# Patient Record
Sex: Male | Born: 1960 | Race: Black or African American | Hispanic: No | Marital: Single | Smoking: Current every day smoker
Health system: Southern US, Community
[De-identification: ages and names within clinical notes are randomized; demographics above are authoritative.]

## PROBLEM LIST (undated history)

## (undated) ENCOUNTER — Encounter

## (undated) ENCOUNTER — Telehealth

## (undated) ENCOUNTER — Ambulatory Visit

## (undated) ENCOUNTER — Ambulatory Visit: Payer: PRIVATE HEALTH INSURANCE | Attending: Family | Primary: Family

## (undated) ENCOUNTER — Ambulatory Visit: Payer: PRIVATE HEALTH INSURANCE

## (undated) ENCOUNTER — Ambulatory Visit
Payer: PRIVATE HEALTH INSURANCE | Attending: Rehabilitative and Restorative Service Providers" | Primary: Rehabilitative and Restorative Service Providers"

## (undated) ENCOUNTER — Encounter
Payer: PRIVATE HEALTH INSURANCE | Attending: Rehabilitative and Restorative Service Providers" | Primary: Rehabilitative and Restorative Service Providers"

## (undated) ENCOUNTER — Ambulatory Visit
Payer: PRIVATE HEALTH INSURANCE | Attending: Pharmacist Clinician (PhC)/ Clinical Pharmacy Specialist | Primary: Pharmacist Clinician (PhC)/ Clinical Pharmacy Specialist

## (undated) ENCOUNTER — Telehealth: Attending: Family | Primary: Family

## (undated) ENCOUNTER — Telehealth: Attending: Children | Primary: Children

## (undated) ENCOUNTER — Encounter
Attending: Pharmacist Clinician (PhC)/ Clinical Pharmacy Specialist | Primary: Pharmacist Clinician (PhC)/ Clinical Pharmacy Specialist

## (undated) ENCOUNTER — Non-Acute Institutional Stay
Payer: PRIVATE HEALTH INSURANCE | Attending: Rehabilitative and Restorative Service Providers" | Primary: Rehabilitative and Restorative Service Providers"

## (undated) ENCOUNTER — Encounter: Attending: Family | Primary: Family

## (undated) ENCOUNTER — Telehealth
Attending: Pharmacist Clinician (PhC)/ Clinical Pharmacy Specialist | Primary: Pharmacist Clinician (PhC)/ Clinical Pharmacy Specialist

## (undated) ENCOUNTER — Encounter: Attending: Diagnostic Radiology | Primary: Diagnostic Radiology

## (undated) DIAGNOSIS — M1990 Unspecified osteoarthritis, unspecified site: Secondary | ICD-10-CM

## (undated) DIAGNOSIS — K922 Gastrointestinal hemorrhage, unspecified: Secondary | ICD-10-CM

## (undated) DIAGNOSIS — F329 Major depressive disorder, single episode, unspecified: Secondary | ICD-10-CM

## (undated) DIAGNOSIS — F32A Depression, unspecified: Secondary | ICD-10-CM

## (undated) DIAGNOSIS — I10 Essential (primary) hypertension: Secondary | ICD-10-CM

## (undated) DIAGNOSIS — C259 Malignant neoplasm of pancreas, unspecified: Secondary | ICD-10-CM

## (undated) DIAGNOSIS — G4730 Sleep apnea, unspecified: Secondary | ICD-10-CM

## (undated) HISTORY — PX: COLONOSCOPY: SHX174

---

## 2000-07-04 ENCOUNTER — Emergency Department (HOSPITAL_COMMUNITY): Admission: EM | Admit: 2000-07-04 | Discharge: 2000-07-04 | Payer: Self-pay | Admitting: Emergency Medicine

## 2003-08-09 ENCOUNTER — Other Ambulatory Visit: Payer: Self-pay

## 2006-07-11 ENCOUNTER — Inpatient Hospital Stay: Payer: Self-pay | Admitting: Unknown Physician Specialty

## 2012-02-23 ENCOUNTER — Inpatient Hospital Stay: Payer: Self-pay | Admitting: Internal Medicine

## 2012-02-23 LAB — CBC WITH DIFFERENTIAL/PLATELET
Basophil %: 0.3 %
Eosinophil %: 0 %
HCT: 38 % — ABNORMAL LOW (ref 40.0–52.0)
HGB: 12.2 g/dL — ABNORMAL LOW (ref 13.0–18.0)
Lymphocyte #: 0.7 10*3/uL — ABNORMAL LOW (ref 1.0–3.6)
MCV: 82 fL (ref 80–100)
Monocyte %: 10.9 %
Neutrophil #: 6.7 10*3/uL — ABNORMAL HIGH (ref 1.4–6.5)
Neutrophil %: 80 %
RBC: 4.66 10*6/uL (ref 4.40–5.90)
WBC: 8.4 10*3/uL (ref 3.8–10.6)

## 2012-02-23 LAB — URINALYSIS, COMPLETE
Glucose,UR: 150 mg/dL (ref 0–75)
Nitrite: NEGATIVE
Ph: 5 (ref 4.5–8.0)
Specific Gravity: 1.041 (ref 1.003–1.030)
WBC UR: 2 /HPF (ref 0–5)

## 2012-02-23 LAB — COMPREHENSIVE METABOLIC PANEL
Albumin: 4.1 g/dL (ref 3.4–5.0)
Alkaline Phosphatase: 81 U/L (ref 50–136)
Anion Gap: 11 (ref 7–16)
BUN: 6 mg/dL — ABNORMAL LOW (ref 7–18)
Bilirubin,Total: 2.1 mg/dL — ABNORMAL HIGH (ref 0.2–1.0)
Chloride: 97 mmol/L — ABNORMAL LOW (ref 98–107)
Creatinine: 1.15 mg/dL (ref 0.60–1.30)
Glucose: 200 mg/dL — ABNORMAL HIGH (ref 65–99)
Potassium: 3.4 mmol/L — ABNORMAL LOW (ref 3.5–5.1)
SGOT(AST): 106 U/L — ABNORMAL HIGH (ref 15–37)
SGPT (ALT): 76 U/L (ref 12–78)
Total Protein: 8.2 g/dL (ref 6.4–8.2)

## 2012-02-23 LAB — TROPONIN I: Troponin-I: 0.05 ng/mL

## 2012-02-23 LAB — LIPASE, BLOOD: Lipase: >3000 U/L (ref 73–393)

## 2012-02-24 LAB — COMPREHENSIVE METABOLIC PANEL
Albumin: 3.4 g/dL (ref 3.4–5.0)
Anion Gap: 10 (ref 7–16)
BUN: 6 mg/dL — ABNORMAL LOW (ref 7–18)
Bilirubin,Total: 2.3 mg/dL — ABNORMAL HIGH (ref 0.2–1.0)
Creatinine: 1.2 mg/dL (ref 0.60–1.30)
Glucose: 123 mg/dL — ABNORMAL HIGH (ref 65–99)
Osmolality: 267 (ref 275–301)
Potassium: 3.4 mmol/L — ABNORMAL LOW (ref 3.5–5.1)
SGOT(AST): 66 U/L — ABNORMAL HIGH (ref 15–37)
SGPT (ALT): 59 U/L (ref 12–78)
Sodium: 134 mmol/L — ABNORMAL LOW (ref 136–145)
Total Protein: 7.3 g/dL (ref 6.4–8.2)

## 2012-02-24 LAB — LIPID PANEL
Cholesterol: 156 mg/dL (ref 0–200)
Ldl Cholesterol, Calc: 50 mg/dL (ref 0–100)
Triglycerides: 81 mg/dL (ref 0–200)

## 2012-02-24 LAB — HEMOGLOBIN A1C: Hemoglobin A1C: 6.4 % — ABNORMAL HIGH (ref 4.2–6.3)

## 2012-02-24 LAB — LIPASE, BLOOD: Lipase: 2367 U/L — ABNORMAL HIGH (ref 73–393)

## 2012-02-27 LAB — LIPID PANEL
Cholesterol: 138 mg/dL (ref 0–200)
Ldl Cholesterol, Calc: 50 mg/dL (ref 0–100)
Triglycerides: 83 mg/dL (ref 0–200)
VLDL Cholesterol, Calc: 17 mg/dL (ref 5–40)

## 2012-02-27 LAB — HEPATIC FUNCTION PANEL A (ARMC)
Alkaline Phosphatase: 57 U/L (ref 50–136)
Bilirubin, Direct: 0.2 mg/dL (ref 0.00–0.20)
Bilirubin,Total: 0.5 mg/dL (ref 0.2–1.0)
SGOT(AST): 61 U/L — ABNORMAL HIGH (ref 15–37)

## 2012-02-27 LAB — CBC WITH DIFFERENTIAL/PLATELET
Basophil #: 0 10*3/uL (ref 0.0–0.1)
Eosinophil #: 0.1 10*3/uL (ref 0.0–0.7)
HCT: 33.4 % — ABNORMAL LOW (ref 40.0–52.0)
Lymphocyte #: 1.4 10*3/uL (ref 1.0–3.6)
Lymphocyte %: 20.6 %
MCHC: 32.3 g/dL (ref 32.0–36.0)
Neutrophil #: 3.9 10*3/uL (ref 1.4–6.5)
Neutrophil %: 55.2 %
Platelet: 221 10*3/uL (ref 150–440)
RDW: 14.2 % (ref 11.5–14.5)

## 2012-02-27 LAB — BASIC METABOLIC PANEL
Anion Gap: 9 (ref 7–16)
Calcium, Total: 8.6 mg/dL (ref 8.5–10.1)
Chloride: 100 mmol/L (ref 98–107)
Co2: 29 mmol/L (ref 21–32)
Creatinine: 0.83 mg/dL (ref 0.60–1.30)
EGFR (African American): >60
Osmolality: 273 (ref 275–301)
Sodium: 138 mmol/L (ref 136–145)

## 2012-02-27 LAB — LIPASE, BLOOD: Lipase: 1449 U/L — ABNORMAL HIGH (ref 73–393)

## 2012-02-28 LAB — LIPASE, BLOOD: Lipase: 1224 U/L — ABNORMAL HIGH (ref 73–393)

## 2012-02-29 LAB — HEPATIC FUNCTION PANEL A (ARMC)
Albumin: 3 g/dL — ABNORMAL LOW (ref 3.4–5.0)
Bilirubin, Direct: 0.2 mg/dL (ref 0.00–0.20)
Bilirubin,Total: 0.6 mg/dL (ref 0.2–1.0)
SGPT (ALT): 56 U/L (ref 12–78)
Total Protein: 7.3 g/dL (ref 6.4–8.2)

## 2012-02-29 LAB — BASIC METABOLIC PANEL
Chloride: 96 mmol/L — ABNORMAL LOW (ref 98–107)
Co2: 29 mmol/L (ref 21–32)
Creatinine: 1.01 mg/dL (ref 0.60–1.30)
Potassium: 3 mmol/L — ABNORMAL LOW (ref 3.5–5.1)
Sodium: 136 mmol/L (ref 136–145)

## 2012-03-01 LAB — COMPREHENSIVE METABOLIC PANEL
Alkaline Phosphatase: 49 U/L — ABNORMAL LOW (ref 50–136)
Calcium, Total: 8.6 mg/dL (ref 8.5–10.1)
Chloride: 104 mmol/L (ref 98–107)
Co2: 28 mmol/L (ref 21–32)
EGFR (African American): >60
EGFR (Non-African Amer.): >60
Glucose: 111 mg/dL — ABNORMAL HIGH (ref 65–99)
SGOT(AST): 57 U/L — ABNORMAL HIGH (ref 15–37)
SGPT (ALT): 54 U/L (ref 12–78)
Sodium: 139 mmol/L (ref 136–145)

## 2012-03-01 LAB — LIPASE, BLOOD: Lipase: 1001 U/L — ABNORMAL HIGH (ref 73–393)

## 2012-09-29 ENCOUNTER — Ambulatory Visit: Payer: Self-pay | Admitting: Gastroenterology

## 2012-09-29 LAB — PROTIME-INR
INR: 1.1
Prothrombin Time: 13.9 secs (ref 11.5–14.7)

## 2013-04-26 ENCOUNTER — Ambulatory Visit: Payer: Self-pay | Admitting: Family Medicine

## 2013-04-28 ENCOUNTER — Ambulatory Visit: Payer: Self-pay | Admitting: Family Medicine

## 2013-08-02 ENCOUNTER — Inpatient Hospital Stay: Payer: Self-pay | Admitting: Family Medicine

## 2013-08-02 LAB — COMPREHENSIVE METABOLIC PANEL
ALBUMIN: 4.2 g/dL (ref 3.4–5.0)
ANION GAP: 5 — AB (ref 7–16)
Alkaline Phosphatase: 62 U/L
BILIRUBIN TOTAL: 1 mg/dL (ref 0.2–1.0)
BUN: 11 mg/dL (ref 7–18)
CALCIUM: 9.4 mg/dL (ref 8.5–10.1)
Chloride: 102 mmol/L (ref 98–107)
Co2: 28 mmol/L (ref 21–32)
Creatinine: 1.38 mg/dL — ABNORMAL HIGH (ref 0.60–1.30)
EGFR (African American): >60
EGFR (Non-African Amer.): 58 — ABNORMAL LOW
Glucose: 115 mg/dL — ABNORMAL HIGH (ref 65–99)
OSMOLALITY: 270 (ref 275–301)
POTASSIUM: 4.1 mmol/L (ref 3.5–5.1)
SGOT(AST): 54 U/L — ABNORMAL HIGH (ref 15–37)
SGPT (ALT): 58 U/L (ref 12–78)
Sodium: 135 mmol/L — ABNORMAL LOW (ref 136–145)
Total Protein: 8.8 g/dL — ABNORMAL HIGH (ref 6.4–8.2)

## 2013-08-02 LAB — CBC
HCT: 40.2 % (ref 40.0–52.0)
HGB: 12.6 g/dL — ABNORMAL LOW (ref 13.0–18.0)
MCH: 26 pg (ref 26.0–34.0)
MCHC: 31.3 g/dL — ABNORMAL LOW (ref 32.0–36.0)
MCV: 83 fL (ref 80–100)
Platelet: 234 10*3/uL (ref 150–440)
RBC: 4.84 10*6/uL (ref 4.40–5.90)
RDW: 14.8 % — AB (ref 11.5–14.5)
WBC: 4.7 10*3/uL (ref 3.8–10.6)

## 2013-08-02 LAB — LIPASE, BLOOD: LIPASE: 267 U/L (ref 73–393)

## 2013-08-02 LAB — HEMOGLOBIN: HGB: 12.8 g/dL — ABNORMAL LOW (ref 13.0–18.0)

## 2013-08-02 LAB — CLOSTRIDIUM DIFFICILE(ARMC)

## 2013-08-03 LAB — HEMOGLOBIN
HGB: 10.9 g/dL — ABNORMAL LOW (ref 13.0–18.0)
HGB: 10.8 g/dL — AB (ref 13.0–18.0)

## 2013-08-04 LAB — HEMOGLOBIN: HGB: 9.5 g/dL — AB (ref 13.0–18.0)

## 2013-08-04 LAB — BASIC METABOLIC PANEL
Anion Gap: 4 — ABNORMAL LOW (ref 7–16)
BUN: 5 mg/dL — AB (ref 7–18)
CREATININE: 1.32 mg/dL — AB (ref 0.60–1.30)
Calcium, Total: 8.4 mg/dL — ABNORMAL LOW (ref 8.5–10.1)
Chloride: 106 mmol/L (ref 98–107)
Co2: 28 mmol/L (ref 21–32)
EGFR (African American): >60
EGFR (Non-African Amer.): >60
Glucose: 116 mg/dL — ABNORMAL HIGH (ref 65–99)
OSMOLALITY: 274 (ref 275–301)
Potassium: 3.9 mmol/L (ref 3.5–5.1)
Sodium: 138 mmol/L (ref 136–145)

## 2013-08-04 LAB — DRUG SCREEN, URINE
AMPHETAMINES, UR SCREEN: NEGATIVE (ref ?–1000)
BENZODIAZEPINE, UR SCRN: NEGATIVE (ref ?–200)
Barbiturates, Ur Screen: NEGATIVE (ref ?–200)
CANNABINOID 50 NG, UR ~~LOC~~: POSITIVE (ref ?–50)
Cocaine Metabolite,Ur ~~LOC~~: NEGATIVE (ref ?–300)
MDMA (Ecstasy)Ur Screen: NEGATIVE (ref ?–500)
Methadone, Ur Screen: NEGATIVE (ref ?–300)
Opiate, Ur Screen: NEGATIVE (ref ?–300)
Phencyclidine (PCP) Ur S: NEGATIVE (ref ?–25)
Tricyclic, Ur Screen: NEGATIVE (ref ?–1000)

## 2013-08-04 LAB — STOOL CULTURE

## 2013-08-05 LAB — CBC WITH DIFFERENTIAL/PLATELET
Basophil #: 0 10*3/uL (ref 0.0–0.1)
Basophil %: 0.6 %
Eosinophil #: 0.1 10*3/uL (ref 0.0–0.7)
Eosinophil %: 1.9 %
HCT: 29.1 % — AB (ref 40.0–52.0)
HGB: 9.2 g/dL — ABNORMAL LOW (ref 13.0–18.0)
LYMPHS ABS: 1.6 10*3/uL (ref 1.0–3.6)
LYMPHS PCT: 37.1 %
MCH: 26.3 pg (ref 26.0–34.0)
MCHC: 31.5 g/dL — ABNORMAL LOW (ref 32.0–36.0)
MCV: 84 fL (ref 80–100)
MONO ABS: 0.8 x10 3/mm (ref 0.2–1.0)
Monocyte %: 18.2 %
NEUTROS ABS: 1.8 10*3/uL (ref 1.4–6.5)
NEUTROS PCT: 42.2 %
Platelet: 189 10*3/uL (ref 150–440)
RBC: 3.48 10*6/uL — ABNORMAL LOW (ref 4.40–5.90)
RDW: 14.6 % — AB (ref 11.5–14.5)
WBC: 4.3 10*3/uL (ref 3.8–10.6)

## 2013-09-08 ENCOUNTER — Ambulatory Visit: Payer: Self-pay | Admitting: Internal Medicine

## 2013-09-08 LAB — FERRITIN: Ferritin (ARMC): 217 ng/mL (ref 8–388)

## 2013-09-08 LAB — CBC CANCER CENTER
BASOS ABS: 1 %
Bands: 1 %
Eosinophil: 4 %
HCT: 36.3 % — ABNORMAL LOW (ref 40.0–52.0)
HGB: 11.4 g/dL — AB (ref 13.0–18.0)
Lymphocytes: 44 %
MCH: 26.1 pg (ref 26.0–34.0)
MCHC: 31.4 g/dL — ABNORMAL LOW (ref 32.0–36.0)
MCV: 83 fL (ref 80–100)
Monocytes: 14 %
Platelet: 315 x10 3/mm (ref 150–440)
RBC: 4.36 10*6/uL — ABNORMAL LOW (ref 4.40–5.90)
RDW: 15.6 % — AB (ref 11.5–14.5)
Segmented Neutrophils: 36 %
WBC: 4.9 x10 3/mm (ref 3.8–10.6)

## 2013-09-08 LAB — IRON AND TIBC
IRON BIND. CAP.(TOTAL): 277 ug/dL (ref 250–450)
Iron Saturation: 39 %
Iron: 108 ug/dL (ref 65–175)
Unbound Iron-Bind.Cap.: 169 ug/dL

## 2013-09-08 LAB — FOLATE: Folic Acid: 10.8 ng/mL (ref 3.1–100.0)

## 2013-09-08 LAB — LACTATE DEHYDROGENASE: LDH: 148 U/L (ref 85–241)

## 2013-10-04 ENCOUNTER — Ambulatory Visit: Payer: Self-pay | Admitting: Internal Medicine

## 2014-01-02 IMAGING — CT CT ABD-PELV W/ CM
1 of 2 series · 14 of 32 positions shown, 18 images · non-contrast
Comparison: none

REASON FOR EXAM: (1) lower abd pain, elevated lipase; (2) lower abd pain,
elevated lipase
COMMENTS:

[Series 2: 3mm soft tissue · axial · 0.68mm/px · z∈[-543,-141]mm · 14 of 148 slices shown, 18 images]
[im 7/148  soft-tissue]
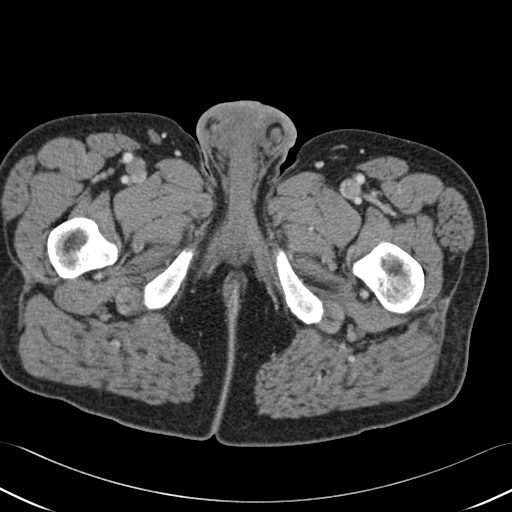
[im 7/148  bone]
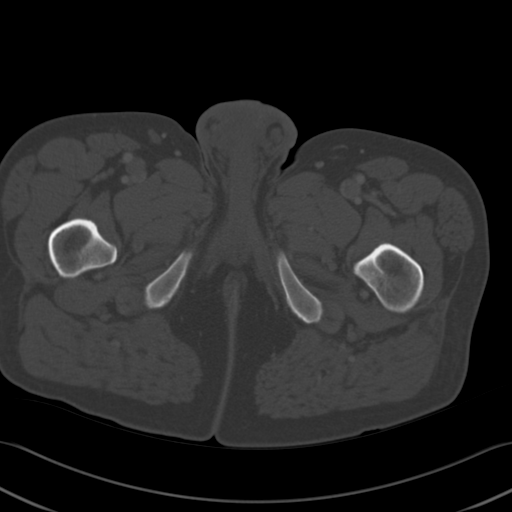
[im 19/148  soft-tissue]
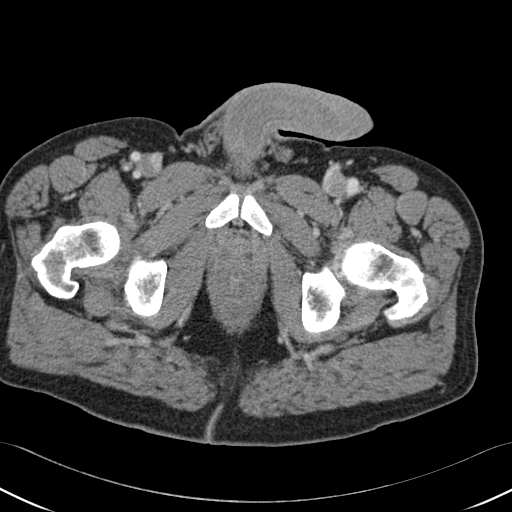
[im 31/148  soft-tissue]
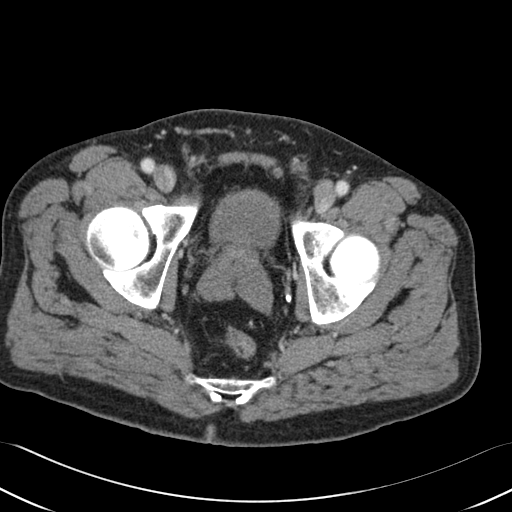
[im 43/148  soft-tissue]
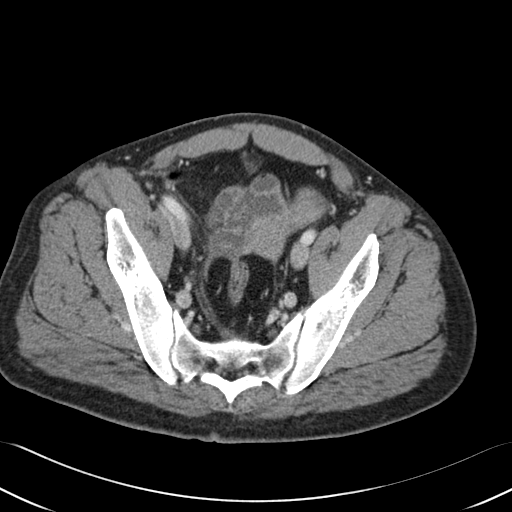
[im 56/148  soft-tissue]
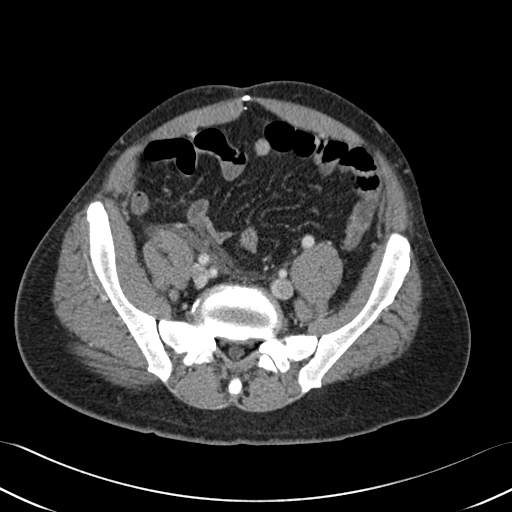
[im 68/148  soft-tissue]
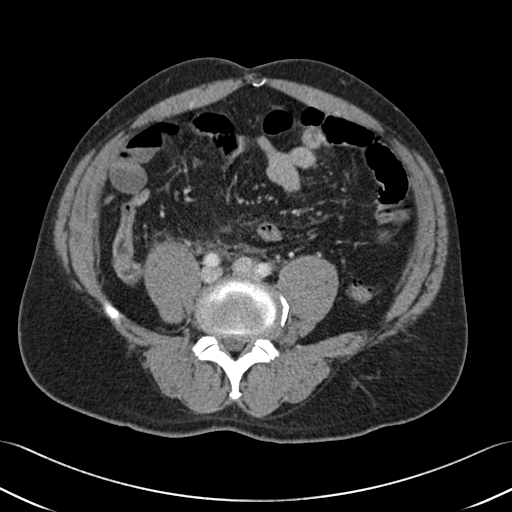
[im 80/148  soft-tissue]
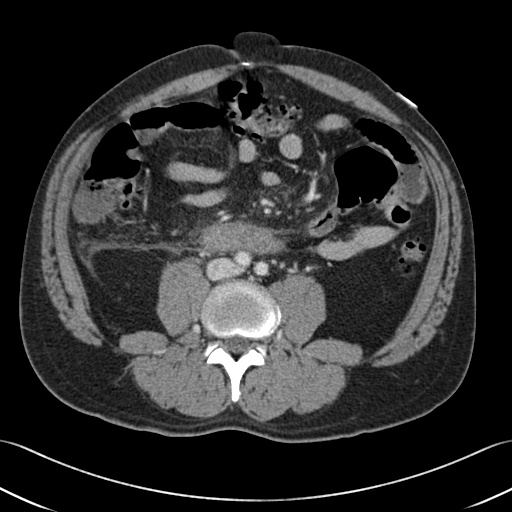
[im 92/148  soft-tissue]
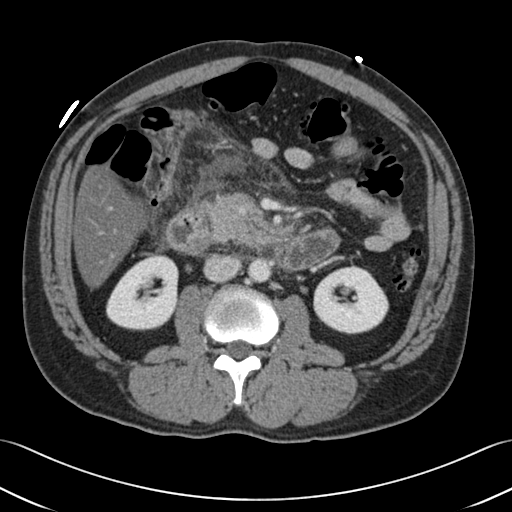
[im 105/148  soft-tissue]
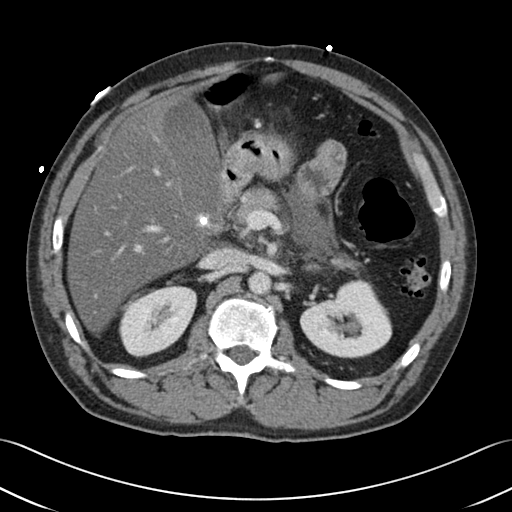
[im 105/148  bone]
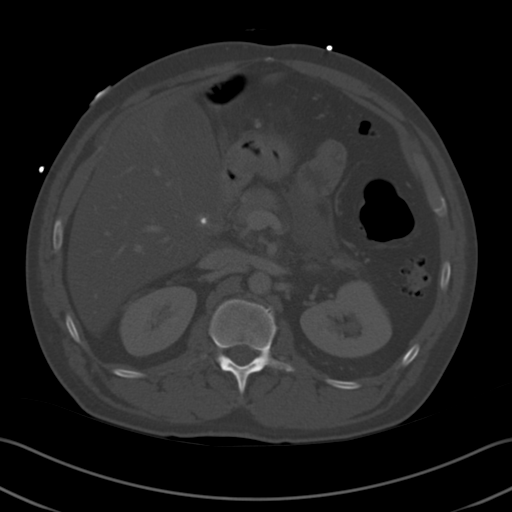
[im 117/148  soft-tissue]
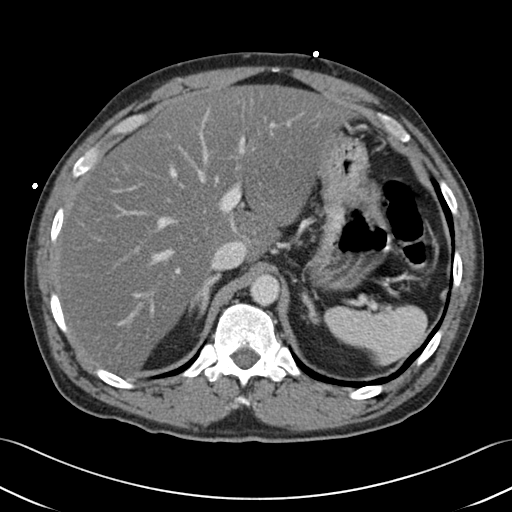
[im 123/148  lung]
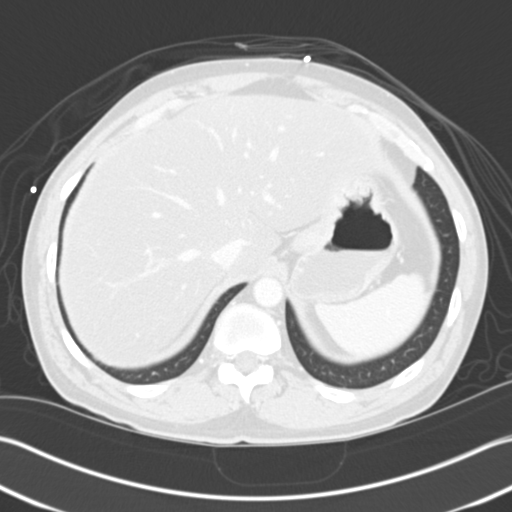
[im 129/148  soft-tissue]
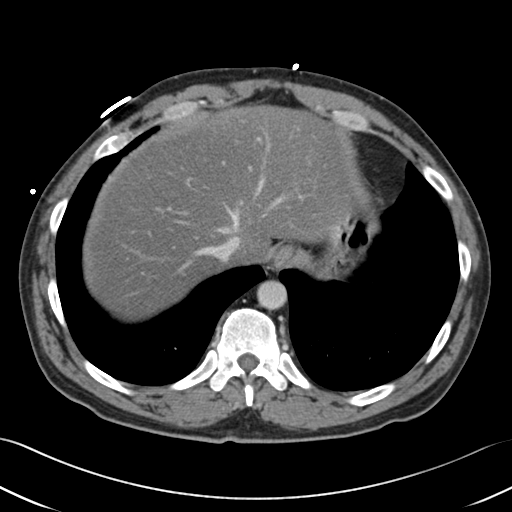
[im 129/148  lung]
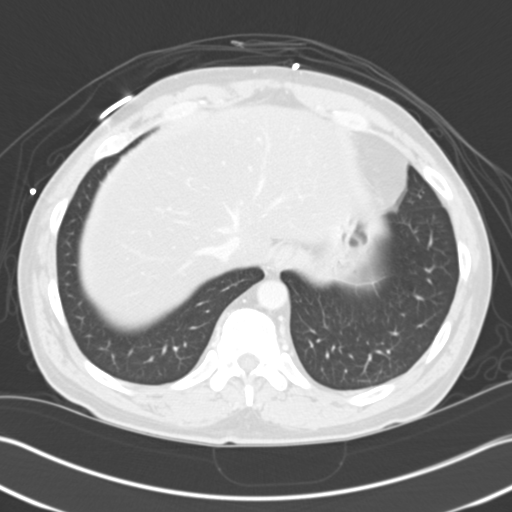
[im 135/148  lung]
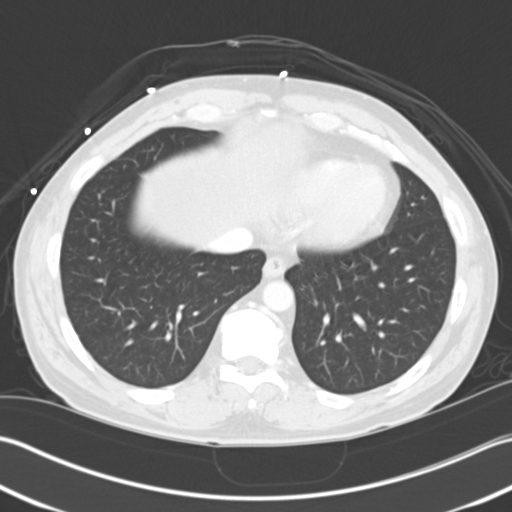
[im 141/148  soft-tissue]
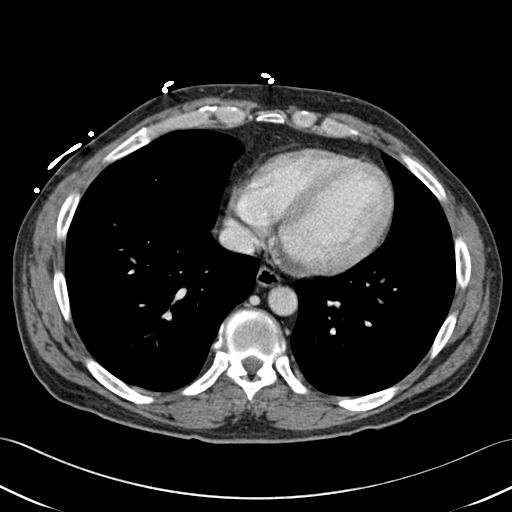
[im 141/148  lung]
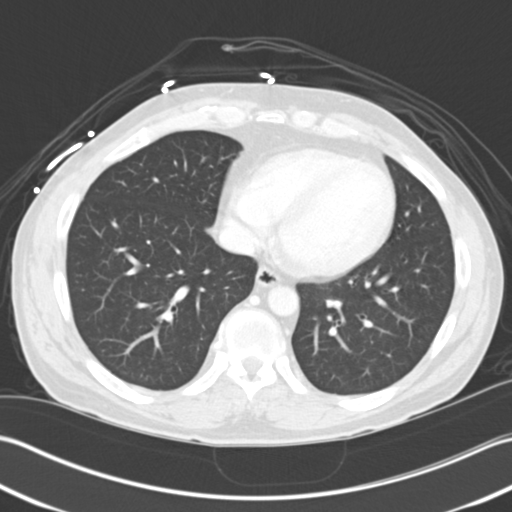

[14 of 32 positions shown; findings below may reference images not displayed]

PROCEDURE:     CT  - CT ABDOMEN / PELVIS  W  - February 23, 2012  [DATE]

RESULT:      Axial CT scanning was performed through the abdomen and pelvis
with reconstructions at 3 mm intervals and slice thicknesses. The patient
received 100 cc of [id number]. The patient did not receive contrast
material. Review of multiplanar reconstructed images was performed
separately on the VIA monitor.

The gallbladder is distended and contains stones near its neck. No definite
gallbladder wall thickening is demonstrated. There are inflammatory changes
in the surrounding fat and these inflammatory changes extend inferiorly and
are associated with the inflamed appearing pancreatic head. There is also
fluid adjacent to the pancreatic body. The stomach and duodenum do not
appear obstructed.  The liver exhibits decreased density diffusely
consistent with fatty infiltration.

The spleen is not enlarged. There are no adrenal masses. The kidneys enhance
well. The caliber of the abdominal aorta is normal. The unopacified loops of
small and large bowel exhibit no evidence of obstruction. There may be
inflammatory changes arising from or secondarily involving portions of the
ascending colon seen on images 53 to 63. There are scattered diverticula
noted throughout the colon.

Within the pelvis the urinary bladder is partially distended. The enlarged
prostate gland produces a moderate impression upon the urinary bladder base.
There is a small right inguinal hernia containing only fat. There is a small
amount of free fluid in the pelvis. The rectosigmoid colon is nondistended.

The lung bases are clear. The lumbar vertebral bodies are preserved in
height.
IMPRESSION: 1. There are inflammatory changes in the upper and mid abdomen likely
secondary to acute pancreatitis. There is fluid adjacent to the pancreatic
body. There are inflammatory changes in the peripancreatic fat. The
gallbladder is mildly distended and contains multiple calcified stones near
the gallbladder neck.
2. There are inflammatory changes associated with the distal aspect of the
ascending colon. There are scattered diverticula here. While focal
diverticulitis could be present here, the degree of adjacent inflammation
extending to the peripancreatic region makes this less likely.
3. There are fatty infiltrative changes of the liver diffusely.
4. There is no evidence of acute urinary tract abnormality nor acute
abnormality of the bowel other than that demonstrated in the ascending
colonic region.

[REDACTED]

Addendum: In discussion of the case with Dr. Natacha it was noted that the
appendix was not mentioned in my original dictation. A normal calibered
gas-filled appendix is demonstrated on images 67 through 722. There are
inflammatory changes extending inferiorly into the right paracolic gutter
likely related to the known pancreatitis.

This addendum was dictated at [DATE] on February 23, 2012.

## 2014-01-02 IMAGING — CR DG ABDOMEN 3V
1 series · 4 of 4 positions shown · non-contrast
Comparison: none

REASON FOR EXAM: abd pain
COMMENTS:

PROCEDURE:     DXR - DXR ABDOMEN 3-WAY (INCL PA CXR)  - February 23, 2012  [DATE]
RESULT:

[Series 1: w chest pa · 0.14mm/px · 4 of 4 slices shown]
[im 1/4]
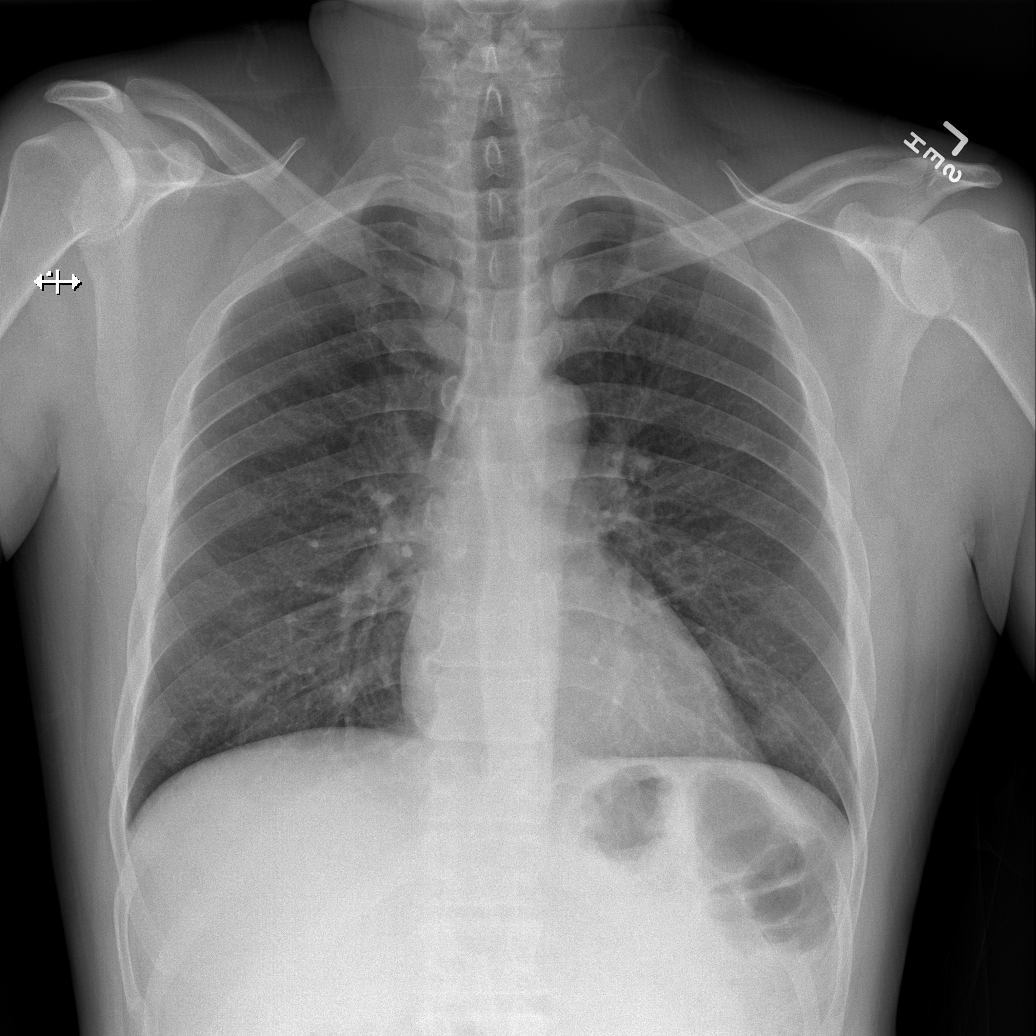
[im 2/4]
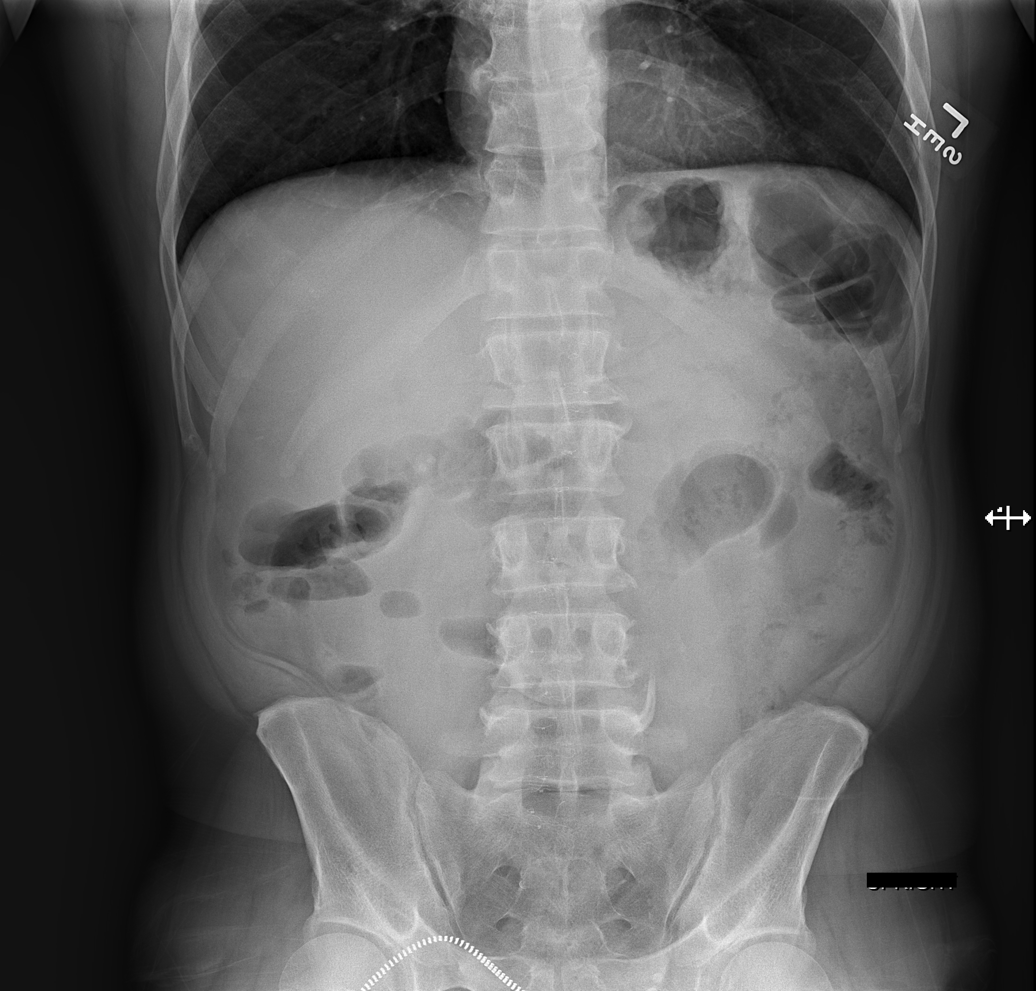
[im 3/4]
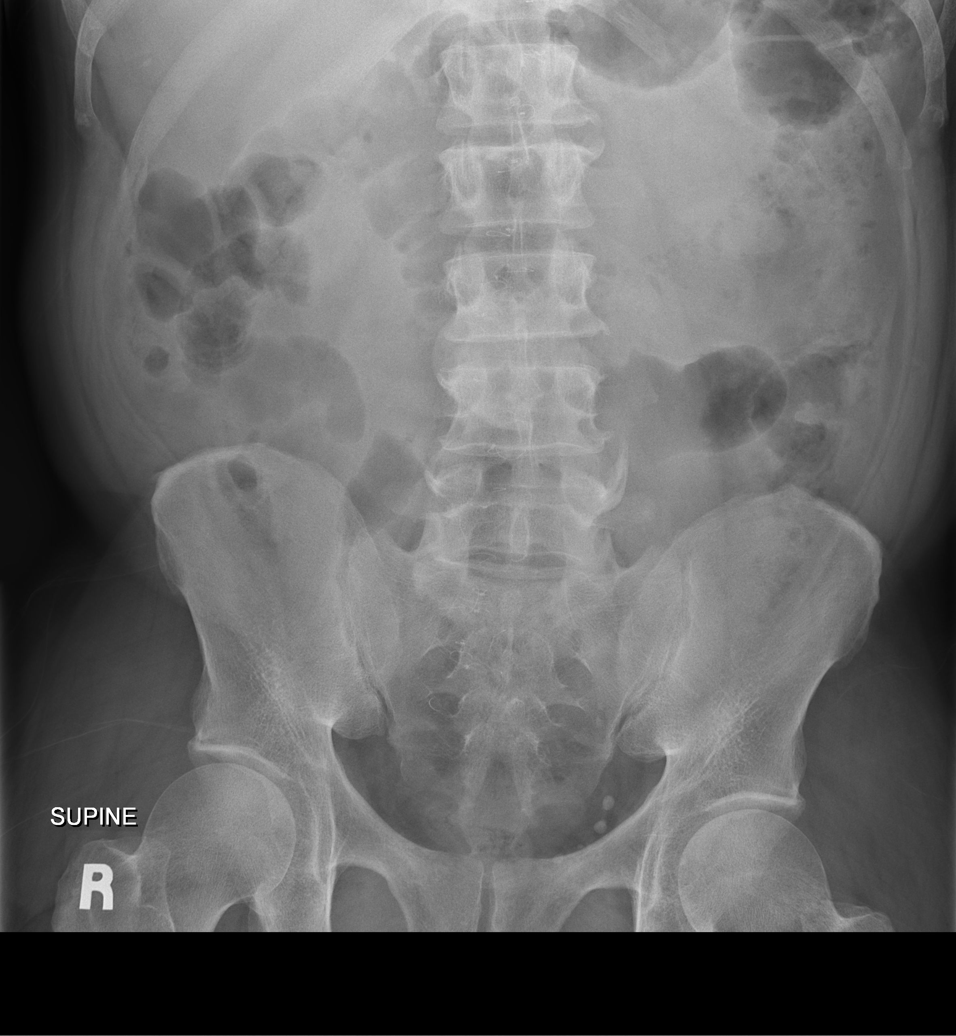
[im 4/4]
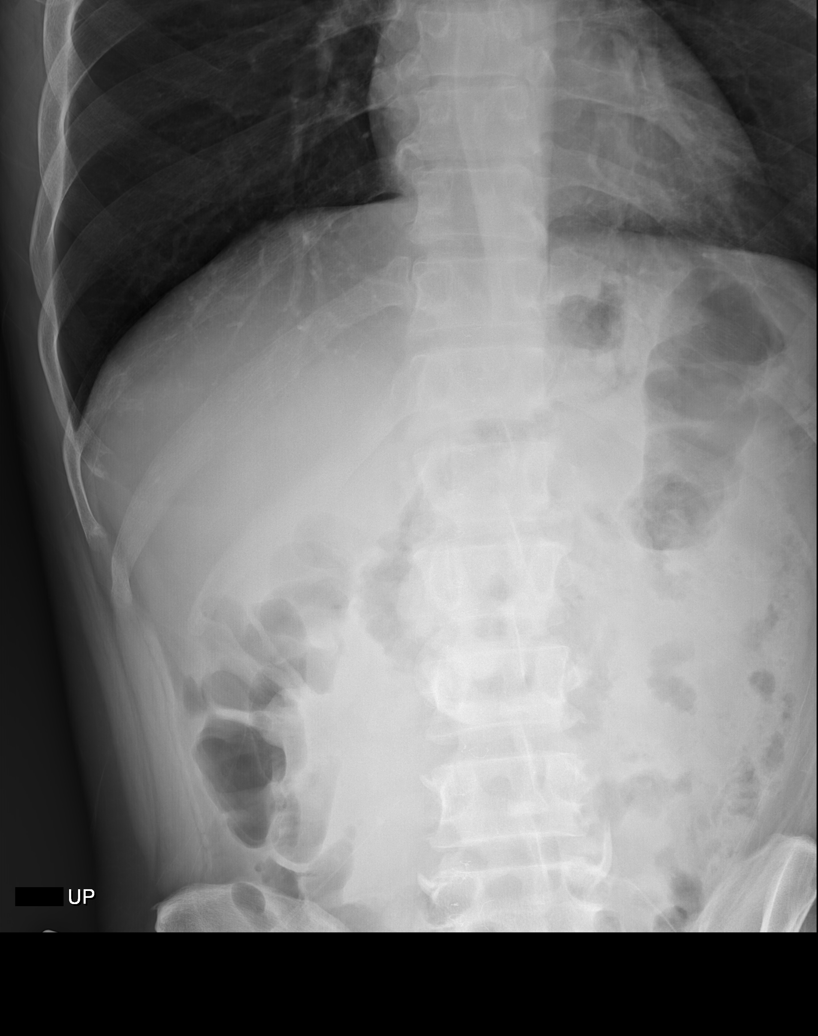

[4 of 4 positions shown; findings below may reference images not displayed]

FINDINGS: Frontal view of the chest demonstrates no evidence of focal
infiltrates, effusions or edema. The cardiac silhouette and visualized bony
skeleton are unremarkable.

Air is seen within nondilated loops of large and small bowel. A moderate to
large amount of stool is appreciated within the colon. The visualized bony
skeleton is unremarkable.
IMPRESSION: 1. Nonobstructive, nonspecific bowel gas pattern.
2. Chest radiograph without evidence of acute cardiopulmonary disease.

## 2014-01-04 IMAGING — NM NUCLEAR MEDICINE HEPATOHBILIARY INCLUDE GB
1 series · 9 of 9 positions shown · non-contrast
Comparison: none

REASON FOR EXAM: gallstones ? gb neck obstruction
COMMENTS:

[Series 1000: gallbladder statics · 4.80mm/px · 9 of 9 slices shown]
[im 1/9]
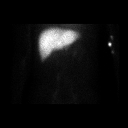
[im 2/9]
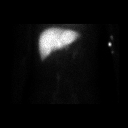
[im 3/9]
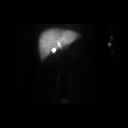
[im 4/9]
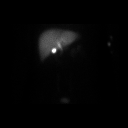
[im 5/9]
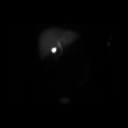
[im 6/9]
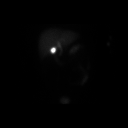
[im 7/9]
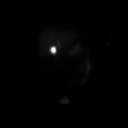
[im 8/9]
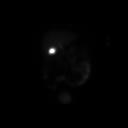
[im 9/9]
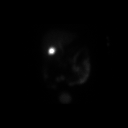

[9 of 9 positions shown; findings below may reference images not displayed]

PROCEDURE:     NM  - NM HEPATOBILIARY IMAGE  - February 25, 2012 [DATE]

RESULT:     A hepatobiliary scan was performed without an ejection fraction
sequence. The patient has a known history of gallstones.

The patient received 8.07 mCi of technetium 99m labeled Choletec. There is
adequate uptake of the radiopharmaceutical by the liver. Activity is
demonstrated within the hepatic ducts and proximal common bile duct and
gallbladder by 10 minutes. Bowel activity is visible by 15 minutes. There is
progressive filling of the gallbladder with the radiolabeled bile.
IMPRESSION: There is no evidence of cystic duct obstruction or common
bile duct obstruction.

[REDACTED]

## 2014-07-26 ENCOUNTER — Emergency Department: Payer: Self-pay | Admitting: Internal Medicine

## 2014-08-23 NOTE — Consult Note (Signed)
Brief Consult Note: Diagnosis: pancreatitis.   Patient was seen by consultant.   Consult note dictated.   Comments: Appreciate consult for 54 y/o [place] American man with history of polysubstance abuse and PUD as a child, for evaluation of pancreatitis and gallstones. Patient reports onset of rlq abdominal pain and nausea 4d ago. States 5 episodes of  emesis 2d ago, some contained some flecks of bright red fluid, but has not had any of this the last 2d. States that eating food makes pain worse. Reports a black stool today.  Denies history of NSAID use, acid reflux symptoms, diarrhea, constipation, problems swallowing. Denies history of pancreatitis/gallstones.  Did note improved lipase of 2367 today, inflammation of pancreas on CT, gallstones near neck of gallbladder on CT. Noted question of inflammation of ascending colon, and that it may be related to the pancreatitis. His bilirubin has elevated some to 2.3 today and there is mild elevation of the AST. Patient does endorse drinking a 12pk/beer/d and smoking marijuana about every 3d. States no crack for 38yr. On exam noted abodminal tenderness primarily to RUQ, some bilat lower quadrants. Abd soft, protuberant. Noted protruding umbilical hernia that is soft, nontender. On rectal exam his stool is light medium brown, heme negative.  Impression and plan: Pancreatitis.  Etoh v. biliary. Will order direct bilirubin, and HIDA without CCK since there are stones in the gallbladder neck. Do recommned etoh/substance cessation. Agree with IV hydration and pain control.  In regards to question of ascending colon inflammation, will plan on colonoscopy after his pancreatitis has resolved and he is feeling better, for personal history of polyps (2005), and abnormal GI xray. Noted he is on anitbiotics at present.Elevation in AST may be related to his etoh consumption.  Will follow with you.Marland Kitchen  Electronic Signatures: Stephens November H (NP)  (Signed 21-Oct-13  15:13)  Authored: Brief Consult Note   Last Updated: 21-Oct-13 15:13 by Theodore Demark (NP)

## 2014-08-23 NOTE — Consult Note (Signed)
Chief Complaint:   Subjective/Chief Complaint denies nausea or vomiting.  abdominal pain 3/10 per patient. passing flatus, stool   VITAL SIGNS/ANCILLARY NOTES: **Vital Signs.:   24-Oct-13 14:05   Temperature Temperature (F) 98.8   Celsius 37.1   Temperature Source oral   Pulse Pulse 79   Respirations Respirations 18   Systolic BP Systolic BP 032   Diastolic BP (mmHg) Diastolic BP (mmHg) 82   Mean BP 99   Pulse Ox % Pulse Ox % 98   Pulse Ox Activity Level  At rest   Oxygen Delivery Room Air/ 21 %   Brief Assessment:   Cardiac Regular    Respiratory clear BS    Gastrointestinal details normal moderate distension, generalized moderate tenderness/pain to palpation, possible  mild peritoneal sign.   Lab Results: Routine Chem:  20-Oct-13 17:31    Lipase  > 3000 (Result(s) reported on 23 Feb 2012 at 06:11PM.)  21-Oct-13 04:34    Lipase  2367 (Result(s) reported on 24 Feb 2012 at 05:29AM.)  22-Oct-13 10:23    Lipase  1249 (Result(s) reported on 25 Feb 2012 at 11:26AM.)  23-Oct-13 04:40    Lipase  1345 (Result(s) reported on 26 Feb 2012 at 05:19AM.)  24-Oct-13 04:00    Lipase  1449 (Result(s) reported on 27 Feb 2012 at 05:43AM.)    05:01    Glucose, Serum  121   BUN  3   Creatinine (comp) 0.83   Sodium, Serum 138   Potassium, Serum 3.6   Chloride, Serum 100   CO2, Serum 29   Calcium (Total), Serum 8.6   Anion Gap 9   Osmolality (calc) 273   eGFR (African American) >60   eGFR (Non-African American) >60 (eGFR values <46m/min/1.73 m2 may be an indication of chronic kidney disease (CKD). Calculated eGFR is useful in patients with stable renal function. The eGFR calculation will not be reliable in acutely ill patients when serum creatinine is changing rapidly. It is not useful in  patients on dialysis. The eGFR calculation may not be applicable to patients at the low and high extremes of body sizes, pregnant women, and vegetarians.)   Assessment/Plan:   Assessment/Plan:   Assessment 1) pancreatitis-etoh related versus biliary.  concern for possible cystic duct stone noted on todays CT.  abdominal exam showing possible mild peritoneal sign.  slight increase of lipase noted over the past 2 days.    Plan 1) will restart abx, check lfts, consider reconsult surgery if increasing.  consider repeating HIDA vs abd u[place] discussed with Dr PPosey Pronto   Electronic Signatures: SLoistine Simas(MD)  (Signed 24-Oct-13 16:38)  Authored: Chief Complaint, VITAL SIGNS/ANCILLARY NOTES, Brief Assessment, Lab Results, Assessment/Plan   Last Updated: 24-Oct-13 16:38 by SLoistine Simas(MD)

## 2014-08-23 NOTE — Consult Note (Signed)
Chief Complaint:   Subjective/Chief Complaint Patient seen and examined, chart reviewed, consult staffed.  Please see full consult for recommendations. Following.   Electronic Signatures: Loistine Simas (MD)  (Signed 21-Oct-13 22:07)  Authored: Chief Complaint   Last Updated: 21-Oct-13 22:07 by Loistine Simas (MD)

## 2014-08-23 NOTE — Consult Note (Signed)
PATIENT NAME:  Louis Gardner, Louis Gardner MR#:  [id number] DATE OF BIRTH:  18-Sep-1960  DATE OF CONSULTATION:  02/24/2012  REFERRING PHYSICIAN:  Dr. Karsten Fells  CONSULTING PHYSICIAN:  Loistine Simas, MD/Christiane Orrin Brigham, NP  HISTORY OF PRESENT ILLNESS: Mr. Flannagan is a 54 year old African American man with a history of polysubstance abuse and peptic ulcer disease as a child. GI has been consulted at the request of Dr. Karsten Fells for evaluation of pancreatitis and gallstones. The patient reports onset of right lower quadrant abdominal pain and nausea four days ago, states five episodes of emesis two days ago, some containing bright red flecks of fluid but has not had any of this for the last two days. States that eating food makes his pain worse. Reports a black stool today. Denies history of NSAID use, acid reflux symptoms, diarrhea, constipation, or problems swallowing. Denies history of pancreatitis and gallstones. Did note improved lipase today of 2367, inflammation of the pancreas on CT, gallstones near the neck of the gallbladder on CT. Noted question of inflammation of ascending colon and that it may be related to the pancreatitis. His bilirubin has elevated to 2.3 today. There is mild elevation of the AST. Does endorse drinking a 12 pack of beer daily and smoking marijuana every three days. States no crack use for the last three years.   ALLERGIES: None known.  MEDICATIONS: Denies regular medications.   PAST MEDICAL HISTORY:  1. Polysubstance abuse including crack cocaine, marijuana, and alcohol. 2. History of anemia in 2005 in which he had upper and lower endoscopies. EGD was normal. Colonoscopy had a 10 mm polyp and diverticulosis.   PAST SURGICAL HISTORY: Had unknown type of gastric surgery for peptic ulcer disease when he was 54 years old.   FAMILY HISTORY: No known history of liver disease, colorectal cancer, colon polyps, pancreatitis, or other GI issues.   SOCIAL HISTORY: Lives with brother.  Continues to smoke cigarettes about a pack a week, approximately a 12 pack of beer daily, perhaps more over the weekend. Marijuana use as noted.   REVIEW OF SYSTEMS: 10 systems reviewed and unremarkable other than what is noted above with the exception of he was admitted with low-grade fever and has had some fatigue.  MOST RECENT LAB WORK: Glucose 123, BUN 6, creatinine 1.2, sodium 134, potassium 3.4, chloride 96, GFR greater than 60, calcium 8.9, mag 2.0, cholesterol 156, triglycerides 81, HDL 90, lipase 2367. A1c 6.4. Total protein 7.3, albumin 3.4, bilirubin 2.3, ALP 72, AST 66, ALT 59. Troponin less than 0.05. WBC 8.4, hemoglobin 12.2, hematocrit 38, platelets 175. Red cells normocytic with normal red cell size.   CT revealing distended gallbladder with stones near the neck. No increased gallbladder wall thickness, questionable surrounding inflammation. Pancreas inflamed at the head and surrounding areas. Fatty liver. Spleen is normal sized. Questionable inflammation near the ascending colon, may or may not be related to pancreatitis. Additionally, on history and physical note it is not thought to be related to any type of appendicitis.   PHYSICAL EXAMINATION:   MOST RECENT VITAL SIGNS: Temperature 98.6, pulse 96, respiratory rate 18, blood pressure 151/93, SAO2 96%. Noted that his CIWA score has ranged anywhere from 3 to 10.  GENERAL: Pleasant African American male lying in bed in no acute distress. States he is feeling better than on arrival.   HEENT: Normocephalic, atraumatic. No redness, drainage, or inflammation noted to the eyes or the nares. There is mild scleral icterus. Oral mucous membranes are pink and moist.  NECK: No thyromegaly, lymphadenopathy, JVD.   RESPIRATORY: Respirations eupneic. Lungs clear to auscultation and percussion.   CARDIAC: S1, S2. RRR. No MRG. Peripheral pulses 2+. No appreciable edema.   ABDOMEN: Protuberant. Do note a protruding umbilical hernia. It is  soft, nontender. Bowel sounds are present x4. There is tenderness primarily to the right upper quadrant and some noted to the bilateral lower quadrants. There is no guarding, rigidity, peritoneal signs, rebound tenderness. Do not appreciate any hepatosplenomegaly or masses.   GU: Deferred.   RECTAL: Stool light to medium brown, heme negative. His rectal tone is rather tight. There are some irregularities around the exterior surface of the perianal ring that look somewhat compatible with condylomata.   SKIN: No erythema, lesion, or rash. No clubbing or cyanosis.   EXTREMITIES: Strength 5 out of 5. MAEW x4. Sensation appears to be intact.   NEUROLOGIC: Alert, oriented x3. Cranial nerves intact. Speech clear. No facial droop.   PSYCH: Pleasant, cooperative. Insight limited. Good memory skills.   IMPRESSION AND PLAN:  1. Pancreatitis, alcoholic versus biliary. Will order direct bilirubin and HIDA without CCK since there are stones in the gallbladder neck. Do recommend alcohol substance sedation. Agree with IV hydration and pain control. In regards to the question of ascending colon inflammation, will plan on colonoscopy after his pancreatitis is resolved and he is feeling better for personal history of polyps and abnormal GI x-ray. Note he is on antibiotics at present.  2. Elevated AST. Will follow. This may be related to his alcohol consumption.   Will follow with you.   These services were provided by Stephens November, MSN, [place] in collaboration with Loistine Simas, MD with whom I have discussed this patient in full.   ____________________________ Theodore Demark, NP chl:drc D: 02/24/2012 15:12:28 ET T: 02/24/2012 15:29:27 ET JOB#: [id number]  cc: Theodore Demark, NP, <Dictator> [place] SIGNED 03/11/2012 8:19

## 2014-08-23 NOTE — H&P (Signed)
PATIENT NAME:  Louis Gardner, Louis Gardner MR#:  [id number] DATE OF BIRTH:  [birth year]  DATE OF ADMISSION:  02/23/2012  ER REFERRING PHYSICIAN: Dr. Corky Downs.   PRIMARY CARE PHYSICIAN:   The patient has no primary care physician.   CHIEF COMPLAINT: Abdominal pain, nausea, vomiting.   HISTORY OF PRESENT ILLNESS: The patient is a 54 year old male with past medical history of polysubstance abuse, smoking marijuana, crack, alcohol, who reports that he was in his usual state of health until three days ago when he started having abdominal pain mainly in the right lower quadrant associated with nausea, vomiting, and low-grade fever. He reports he has not been able to eat anything orally for the last couple of days. He was found to have acute pancreatitis with lipase level more than 3,000. In addition to gallstones he also uses alcohol daily. He was very tender in the right lower quadrant. As per Dr. Martinique, radiologist, he does not have appendicitis. He has a lot of mesenteric inflammation resulting from the pancreatitis in that area.   ALLERGIES: No known drug allergies.   MEDICATIONS: The patient does not take any medications.   PAST MEDICAL HISTORY:  1. History of polysubstance abuse. He has used crack cocaine in the past. He smokes daily. He uses alcohol daily, occasionally smokes marijuana.  2. He was admitted to [facility] in 2005 for anemia and bright red bleeding per rectum. At that time an endoscopy was normal. Colonoscopy showed a 10 mm polyp and diverticulosis.   PAST SURGICAL HISTORY: The patient reports that he had some kind of abdominal/gastric surgery for peptic ulcer disease when he was 54 years old.   FAMILY HISTORY: The patient denies any family history of diabetes, hypertension, coronary artery disease, liver or kidney problems.   SOCIAL HISTORY: The patient lives with his brother. He reports that he has quit using crack cocaine. The last use was about two years ago. He  smokes marijuana occasionally. He smokes cigarettes 1 pack over a week. The patient drinks alcohol daily, anywhere from 6 to 12, 24-ounce packs in a day and maybe some more over the weekend.   REVIEW OF SYSTEMS: CONSTITUTIONAL: Reports low-grade fever, fatigue and weakness. EYES: Denies any blurred or double vision. ENT: Denies any tinnitus or ear pain. RESPIRATORY: Denies any cough or wheezing. CARDIOVASCULAR: Denies any chest pain or palpitations. GASTROINTESTINAL: Reports nausea, vomiting, abdominal pain. GU: Denies any dysuria or hematuria. ENDOCRINE: Denies any polyuria or nocturia. HEME/LYMPH: Denies any anemia, easy bruisability. HEENT: Denies any acne or rash. MUSCULOSKELETAL: Denies any swelling or gout. NEUROLOGICAL: Denies any numbness or weakness. PSYCH: Denies any anxiety or depression.   PHYSICAL EXAMINATION:  VITAL SIGNS: Temperature 99.5, heart rate 132, respiratory rate 22, blood pressure 195/107.   GENERAL: The patient is a 54 year old African American male lying in bed in some distress due to abdominal pain.   HEAD: Atraumatic, normocephalic.   EYES: There is mild pallor. No icterus or cyanosis. Pupils are equal, round, and reactive to light and accommodation. Extraocular movements intact.   ENT: Wet mucous membranes. No oropharyngeal erythema or thrush.   NECK: Supple. No masses. No JVD. No thyromegaly or lymphadenopathy.   CHEST WALL: No tenderness to palpation. Not using accessory muscles of respiration. No intercostal muscle retraction.   LUNGS: Bilaterally clear to auscultation. No wheezing, rales, or rhonchi.   CARDIOVASCULAR: S1, S2 regular. No murmur, rubs or gallop.   ABDOMEN: Soft. No guarding. No rigidity. The patient has hypoactive bowel sounds. He  has tenderness to palpation in his right upper and right lower quadrant.   SKIN: No rashes or lesions.   PERIPHERY: No pedal edema. 2+ pedal pulses.   MUSCULOSKELETAL: No cyanosis or clubbing.   NEUROLOGIC:  Awake, alert, oriented x3. Nonfocal neurological exam. Cranial nerves grossly intact.   PSYCH: Normal mood and affect.   LABORATORY, DIAGNOSTIC, AND RADIOLOGICAL DATA: CT of the abdomen shows acute pancreatitis changes. There are some inflammatory changes along the ascending colon and inflammation in the mesenteric fat. Urinalysis shows no evidence of infection. White count is 8.4, hemoglobin 12.2, hematocrit 38, platelet count 173, glucose 200, BUN 6, creatinine 1.95, sodium 132, potassium 3.7, chloride 97, bilirubin 2.1, alkaline phosphatase 81, ALT 76, AST 106, lipase more than 3,000. Cardiac enzymes normal.   ASSESSMENT AND PLAN: A 54 year old male with history of alcohol abuse and smoking who presents with abdominal pain.  1. Acute pancreatitis. The patient has gallstones and also abuses alcohol. Will admit him to the hospital and keep him n.p.o. except medications and ice chips. Will start him on aggressive IV fluid hydration. Give him p.r.n. analgesics and antiemetics. He had mildly elevated bilirubin and AST. This could be due to alcohol abuse. However, obstruction is also in the differential diagnosis. Will recheck his CMP tomorrow morning. Will also start on empiric antibiotics since the patient has been having evidence of sepsis with fever and tachycardia. We will obtain a gastroenterology and surgery consult. The patient is very tender in his right lower quadrant. The case was discussed with Dr. Martinique who reports that the patient has normal appendix and there is no evidence of appendicitis. The patient has a lot of mesenteric inflammation resulting from the pancreatitis in that area, which could be contributing to the right lower quadrant abdominal pain. 2. Sepsis with low-grade fever and tachycardia, likely due to acute pancreatitis. Will start the patient on meropenem.  3. Elevated blood pressure with no pre-existing diagnosis of hypertension. This could be related to acute stress and pain.  Will start the patient on p.r.n. hydralazine. The patient may need medications if his blood pressure is not controlled.  4. Anemia, possibly of chronic disease. We will monitor closely.  5. Hyperglycemia. The patient has no history of diabetes. This could be related to his severe acute pancreatitis. We will check hemoglobin A1c.  6. Hyponatremia, possibly due to dehydration resulting from poor oral intake. Will hydrate with IV fluids.  7. Hypokalemia, likely related to nausea and vomiting due to acute pancreatitis. Will supplement IV.  8. History of smoking. The patient has been counseled about cessation for more than three minutes. We will provide with a nicotine patch.  9. Alcohol abuse. The patient drinks daily alcohol. We will place him on IV CIWA protocol.   I reviewed old medical records, discussed with the ED physician, discussed with the patient the plan of care and management.   TIME SPENT: 75 minutes.   ____________________________ Cherre Huger, MD sp:ap D: 02/23/2012 20:20:31 ET T: 02/24/2012 08:26:18 ET JOB#: [id number]  cc: Cherre Huger, MD, <Dictator> Cherre Huger MD ELECTRONICALLY SIGNED 02/24/2012 12:08

## 2014-08-23 NOTE — Consult Note (Signed)
Chief Complaint:   Subjective/Chief Complaint states pain is improving, 2/10 today.  no n/v, passing flatus and stool.   VITAL SIGNS/ANCILLARY NOTES: **Vital Signs.:   26-Oct-13 05:10   Vital Signs Type Routine   Temperature Temperature (F) 98.3   Celsius 36.8   Temperature Source Oral   Pulse Pulse 67   Respirations Respirations 20   Systolic BP Systolic BP 259   Diastolic BP (mmHg) Diastolic BP (mmHg) 81   Mean BP 93   Pulse Ox % Pulse Ox % 96   Pulse Ox Activity Level  At rest   Oxygen Delivery Room Air/ 21 %   Brief Assessment:   Cardiac Regular    Respiratory clear BS    Gastrointestinal details normal Bowel sounds normal  upper abdomen is softer and less tender to palpation, though the lower abdomen is still firm and markedly tender to palpation.   Lab Results: Hepatic:  26-Oct-13 04:33    Bilirubin, Total 0.6   Bilirubin, Direct 0.2 (Result(s) reported on 29 Feb 2012 at 11:29AM.)   Alkaline Phosphatase 60   SGPT (ALT) 56   SGOT (AST)  67   Total Protein, Serum 7.3   Albumin, Serum  3.0  Routine Chem:  26-Oct-13 04:33    Lipase  1309 (Result(s) reported on 29 Feb 2012 at 05:16AM.)   Glucose, Serum  179   BUN  2   Creatinine (comp) 1.01   Sodium, Serum 136   Potassium, Serum  3.0   Chloride, Serum  96   CO2, Serum 29   Calcium (Total), Serum 8.9   Anion Gap 11   Osmolality (calc) 273   eGFR (African American) >60   eGFR (Non-African American) >60 (eGFR values <46m/min/1.73 m2 may be an indication of chronic kidney disease (CKD). Calculated eGFR is useful in patients with stable renal function. The eGFR calculation will not be reliable in acutely ill patients when serum creatinine is changing rapidly. It is not useful in  patients on dialysis. The eGFR calculation may not be applicable to patients at the low and high extremes of body sizes, pregnant women, and vegetarians.)   Assessment/Plan:  Assessment/Plan:   Assessment 1) pancreatitis-biliary  versus etoh related.  patietn currently with continued pain on abdominal exam though reports sx are improving and tolerating po.  some minimal improvement of exam today.  Lipase still elevated, no decrease for several days.    Plan 1) continue current, awaiting repeat hepatobiliary to r/o cystic duct obstruction.  if HIDA c/w this will need repeat surgical consult. Discussed with Dr KTressia Miners   Electronic Signatures: SLoistine Simas(MD)  (Signed 26-Oct-13 15:27)  Authored: Chief Complaint, VITAL SIGNS/ANCILLARY NOTES, Brief Assessment, Lab Results, Assessment/Plan   Last Updated: 26-Oct-13 15:27 by SLoistine Simas(MD)

## 2014-08-23 NOTE — Consult Note (Signed)
Chief Complaint:   Subjective/Chief Complaint states abdominal pain is 3/10.  no emesis or nausea, had bm and passing flatus   VITAL SIGNS/ANCILLARY NOTES: **Vital Signs.:   23-Oct-13 06:04   Vital Signs Type Routine   Temperature Temperature (F) 98.7   Celsius 37   Temperature Source Oral   Pulse Pulse 84   Respirations Respirations 22   Systolic BP Systolic BP 005   Diastolic BP (mmHg) Diastolic BP (mmHg) 81   Mean BP 99   Pulse Ox % Pulse Ox % 98   Pulse Ox Activity Level  At rest   Oxygen Delivery Room Air/ 21 %   Brief Assessment:   Cardiac Regular    Respiratory clear BS    Gastrointestinal details normal Bowel sounds normal  No rebound tenderness  mild to moderate distension, improved form yesterday, generalized tenderness/pain to palpation.   Lab Results: Routine Chem:  23-Oct-13 04:40    Lipase  1345 (Result(s) reported on 26 Feb 2012 at 05:19AM.)   Assessment/Plan:  Assessment/Plan:   Assessment 1) pancreatitis-etoh related versus biliary.  patient with gallstones, no ductal anomaly.  some increase of lipase today after increasing diet, will move back to clears.    2) abnormal ct scan possible right sided colitis, possible inflammatory response to pancreatitis.    Plan 1) awaiting improvement of pancreatitis.  colonoscopy when clinically feasible.  Colon prep due to the amount could exacerbate the pancreatitis.  Recommend repeat ct abd this weekend for comparison.  folowoing.   Electronic Signatures: Loistine Simas (MD)  (Signed 23-Oct-13 19:34)  Authored: Chief Complaint, VITAL SIGNS/ANCILLARY NOTES, Brief Assessment, Lab Results, Assessment/Plan   Last Updated: 23-Oct-13 19:34 by Loistine Simas (MD)

## 2014-08-23 NOTE — Consult Note (Signed)
Chief Complaint:   Subjective/Chief Complaint patient states abdominal pain is 2-4 /10.  denies abdominal pain, passing flatus, small bm per patient. tolerating clears   VITAL SIGNS/ANCILLARY NOTES: **Vital Signs.:   22-Oct-13 14:14   Vital Signs Type Routine   Temperature Temperature (F) 99.7   Celsius 37.6   Temperature Source Oral   Pulse Pulse 101   Respirations Respirations 18   Systolic BP Systolic BP 882   Diastolic BP (mmHg) Diastolic BP (mmHg) 79   Mean BP 102   Pulse Ox % Pulse Ox % 98   Pulse Ox Activity Level  At rest   Oxygen Delivery Room Air/ 21 %   Brief Assessment:   Cardiac Regular    Respiratory clear BS    Gastrointestinal details normal No rebound tenderness  dpositive hyperactive bowel sounds, abdomen distended, diffuse tender   Lab Results: Routine Chem:  22-Oct-13 10:23    Lipase  1249 (Result(s) reported on 25 Feb 2012 at 11:26AM.)   Assessment/Plan:  Assessment/Plan:   Assessment 1) pancreatitis-etoh related.  HIDA showing no CBD or cystic duct obstruction.  symptoms improving with labs, however physical exam still lagging.  2) abnormal CT showing edema in the right colon.    Plan 1) continue current. will reassess daily.  possible colonoscopy when clinically feasible, once pancreatitis improved.   Electronic Signatures: Loistine Simas (MD)  (Signed 22-Oct-13 22:56)  Authored: Chief Complaint, VITAL SIGNS/ANCILLARY NOTES, Brief Assessment, Lab Results, Assessment/Plan   Last Updated: 22-Oct-13 22:56 by Loistine Simas (MD)

## 2014-08-23 NOTE — Discharge Summary (Signed)
PATIENT NAME:  Louis Gardner, Louis Gardner MR#:  [id number] DATE OF BIRTH:  [birth year]  DATE OF ADMISSION:  02/23/2012 DATE OF DISCHARGE:  03/03/2012  ADMITTING PHYSICIAN: Cherre Huger, MD  DISCHARGING PHYSICIAN: Gladstone Lighter, MD  PRIMARY CARE PHYSICIAN: None.   CONSULTANTS:  1. Loistine Simas, MD - Gastroenterology.  2. Phoebe Perch, MD - Surgery.  DISCHARGE DIAGNOSES:  1. Acute pancreatitis likely secondary to alcohol.  2. Gallstones present.  3. Alcohol abuse. 4. Tobacco use disorder.  5. Hypertension.  6. Hypokalemia.  7. Systemic antiinflammatory response syndrome on admission due to pancreatitis.  DISCHARGE HOME MEDICATIONS: 1. Metoprolol 25 mg p.o. twice a day. 2. Nicotine patch transdermal 21 mg daily.   DISCHARGE DIET: Advised to do low residue diet and eat light for the first meal.   DISCHARGE ACTIVITY: As tolerated.   FOLLOWUP INSTRUCTIONS:  1. Primary care physician followup at the [facility] in 1 to 2 weeks.  2. Follow up with Dr. Gustavo Lah in 2 to 3 weeks.  3. Smoking and alcohol cessation have been advised.   LABS AND IMAGING STUDIES: Sodium 139, potassium 3.6, chloride 104, bicarbonate 28, BUN 2, creatinine 0.91, glucose 111, and calcium 8.6.   ALT 54, AST 57, alkaline phosphatase 49, total bilirubin 0.5, and albumin 3.7. Lipase at the time of discharge is 749.   Repeat HIDA scan showed unremarkable hepatobiliary scan.   WBC is 7.0, hemoglobin 10.8, hematocrit 33.4, and platelet count 221.   CT of the abdomen with contrast is showing inflammatory changes in mid and upper abdomen consistent with acute pancreatitis and stones in the gallbladder. One may have migrated into the proximal cystic duct and gallbladder wall thickening is demonstrated. The pancreatic head and body are mildly edematous but no ductal dilatation. Inflammatory changes appear to be the same as prior CT scan.  Lipase has been as high as 1449. Triglycerides 83, total cholesterol 138,  LDL 50, and HDL 71.   BRIEF HOSPITAL COURSE: [name] is a 54 year old African American male with past medical history significant for alcohol abuse and smoking who presented to the hospital secondary to severe abdominal pain, nausea, vomiting, and found to have lipase greater than 3000. 1. Acute alcoholic pancreatitis. With IV fluids and keeping him n.p.o. his lipase has not significantly come down. His ultrasound and CT did show some gallstones so surgery was consulted and HIDA scan was negative so no surgical plans and his pancreatitis was believed to be secondary to alcohol induced. However, the patient did have some peritoneal signs initially on examination with severe abdominal tenderness and persistently elevated lipase so repeat CT was done which showed edematous changes in the pancreas and repeat HIDA scan did not reveal any abnormality of the gallbladder. So meropenem was added to help with inflammation and he had extensive IV fluids and diet was very slowly advanced. His lipase has improved significantly to 700 range. Today his abdominal pain resolved. He was seen by gastroenterology and surgery while in the hospital. No nausea or vomiting and he is tolerating low residual diet at the time of discharge without any significant trouble.  2. Systemic antiinflammatory response syndrome likely secondary to his acute pancreatitis, now resolved at this time.  3. Hypertension. He was started on metoprolol while in the hospital and will be discharged on the same.  4. Electrolyte abnormalities secondary to dehydration and poor p.o. intake. Those were supplemented as needed.  5. Smoking and alcohol abuse. He was on a nicotine patch while in the  hospital. He was strongly counseled and is motivated to quit at this time. He will be discharged on nicotine patches and he was also counseled against alcohol use. He was on CIWA protocol while in the hospital. Again, he is motivated to quit at this time. His course  has been otherwise uneventful in the hospital.   DISCHARGE CONDITION: Stable.   DISCHARGE DISPOSITION: Home.   TIME SPENT ON DISCHARGE: 45 minutes. ____________________________ Gladstone Lighter, MD rk:slb D: 03/03/2012 14:00:07 ET T: 03/03/2012 17:25:42 ET JOB#: [id number]  cc: Gladstone Lighter, MD, <Dictator> [facility] Lollie Sails, MD Gladstone Lighter MD ELECTRONICALLY SIGNED 03/04/2012 14:15

## 2014-08-23 NOTE — Consult Note (Signed)
Chief Complaint:   Subjective/Chief Complaint seen for pancreatitis, feeling some better today, no n/v less abdominal discomfort.   VITAL SIGNS/ANCILLARY NOTES: **Vital Signs.:   27-Oct-13 14:05   Vital Signs Type Routine   Temperature Temperature (F) 98.2   Celsius 36.7   Temperature Source Oral   Pulse Pulse 72   Respirations Respirations 18   Systolic BP Systolic BP 563   Diastolic BP (mmHg) Diastolic BP (mmHg) 78   Mean BP 93   Pulse Ox % Pulse Ox % 100   Pulse Ox Activity Level  At rest   Oxygen Delivery Room Air/ 21 %   Brief Assessment:   Cardiac Regular    Respiratory clear BS    Gastrointestinal details normal Soft  Nondistended  No masses palpable  Bowel sounds normal  mild tenderness to palpation in the lower abdomen-improved.   Lab Results:  Hepatic:  27-Oct-13 04:51    Bilirubin, Total 0.5   Alkaline Phosphatase  49   SGPT (ALT) 54   SGOT (AST)  57   Total Protein, Serum 6.8   Albumin, Serum  2.7  Routine Chem:  20-Oct-13 17:31    Lipase  > 3000 (Result(s) reported on 23 Feb 2012 at 06:11PM.)  21-Oct-13 04:34    Lipase  2367 (Result(s) reported on 24 Feb 2012 at 05:29AM.)  22-Oct-13 10:23    Lipase  1249 (Result(s) reported on 25 Feb 2012 at 11:26AM.)  23-Oct-13 04:40    Lipase  1345 (Result(s) reported on 26 Feb 2012 at 05:19AM.)  24-Oct-13 04:00    Lipase  1449 (Result(s) reported on 27 Feb 2012 at 05:43AM.)  25-Oct-13 04:44    Lipase  1224 (Result(s) reported on 28 Feb 2012 at 05:36AM.)  26-Oct-13 04:33    Lipase  1309 (Result(s) reported on 29 Feb 2012 at 05:16AM.)  27-Oct-13 04:51    Lipase  1001 (Result(s) reported on 01 Mar 2012 at 05:35AM.)   Glucose, Serum  111   BUN  2   Creatinine (comp) 0.91   Sodium, Serum 139   Potassium, Serum 3.6   Chloride, Serum 104   CO2, Serum 28   Calcium (Total), Serum 8.6   Osmolality (calc) 274   eGFR (African American) >60   eGFR (Non-African American) >60 (eGFR values <64m/min/1.73 m2 may be an  indication of chronic kidney disease (CKD). Calculated eGFR is useful in patients with stable renal function. The eGFR calculation will not be reliable in acutely ill patients when serum creatinine is changing rapidly. It is not useful in  patients on dialysis. The eGFR calculation may not be applicable to patients at the low and high extremes of body sizes, pregnant women, and vegetarians.)   Anion Gap 7   Assessment/Plan:  Assessment/Plan:   Assessment 1) pancreatitis, biliary versus ETOH related. improving.  repeat HIDA negative for cystic duct obstruction.  2) h/o etoh abuse-again counselled etoh cessation.    Plan 1) will advance diet to full liquids today.  continue current.  following.   Electronic Signatures: SLoistine Simas(MD)  (Signed 27-Oct-13 17:12)  Authored: Chief Complaint, VITAL SIGNS/ANCILLARY NOTES, Brief Assessment, Lab Results, Assessment/Plan   Last Updated: 27-Oct-13 17:12 by SLoistine Simas(MD)

## 2014-08-23 NOTE — Consult Note (Signed)
PATIENT NAME:  Louis Gardner, Louis Gardner MR#:  [id number] DATE OF BIRTH:  1960-10-04  DATE OF CONSULTATION:  02/24/2012  REFERRING PHYSICIAN:   CONSULTING PHYSICIAN:  Jerrol Banana. Burt Knack, MD  CHIEF COMPLAINT: Pancreatitis.   HISTORY OF PRESENT ILLNESS: This is a patient admitted to the hospital and the surgical service consulted for a diagnosis of pancreatitis of unclear nature. Patient is a 54 year old male with a history of severe alcohol abuse with 12 beers per day. He also has used various drugs in the past and smokes tobacco who presents with several days of diffuse abdominal pain and some back pain. His pain was in the right lower quadrant but it now is somewhat diffuse in nature. He has had some nausea but no vomiting today. Denies fevers or chills. States he feels better than he was earlier.   PAST MEDICAL HISTORY: Polysubstance abuse.   PAST SURGICAL HISTORY: Surgery for peptic ulcer disease many years ago. He is not clear what type but thinks part of his stomach may have been removed.   FAMILY HISTORY: Noncontributory.   SOCIAL HISTORY: Patient has a history of polysubstance abuse, severe alcohol abuse with a 12 pack per day and smokes cigarettes.   REVIEW OF SYSTEMS: 10 system review is performed and negative with the exception of that mentioned in the history of present illness.   PHYSICAL EXAMINATION:  GENERAL: Healthy-appearing male patient.   VITAL SIGNS: Temperature 99.6, pulse 109, respirations 20, blood pressure 156/87, 96% room air sat.   HEENT: No scleral icterus.   NECK: No palpable neck nodes.   CHEST: Clear to auscultation.   CARDIAC: Regular rate and rhythm.   ABDOMEN: Abdomen shows a long midline scar with an umbilical hernia which is partially reducible, nontender without erythema. Abdomen is diffusely tender in all four quadrants. No percussion tenderness. No rebound tenderness. Slightly distended.   EXTREMITIES: Without edema. Calves are nontender.   NEUROLOGIC:  Grossly intact.   INTEGUMENT: No jaundice.   LABORATORY, DIAGNOSTIC AND RADIOLOGICAL DATA: Total bilirubin 0.6, lipase 23 67, AST 66, ALT 59, bilirubin 2.3, potassium 3.4, creatinine 1.2.   ASSESSMENT AND PLAN: This is a patient with known gallstones but severe alcohol abuse. He has abdominal pain and tenderness without peritoneal signs. His liver function test pattern is more suggestive of alcoholic etiology than gallstones and I doubt that he has developed gallstone pancreatitis. I believe this is likely due to alcoholism and will be observed with the primary team. If he were to worsen or his liver function tests became more suggestive of gallbladder disease then cholecystectomy could be considered versus cholecystostomy etc. This was reviewed with the patient. He understood and agreed with this plan.   ____________________________ Jerrol Banana. Burt Knack, MD rec:cms D: 02/25/2012 01:01:00 ET T: 02/25/2012 09:44:50 ET JOB#: [id number]  cc: Jerrol Banana. Burt Knack, MD, <Dictator> Florene Glen MD ELECTRONICALLY SIGNED 02/25/2012 10:49

## 2014-08-23 NOTE — Consult Note (Signed)
Chief Complaint:   Subjective/Chief Complaint no n/v, tolerating clears liquids.  patient states abdominal pain is 3/10.  passing flatus and bm.   VITAL SIGNS/ANCILLARY NOTES: **Vital Signs.:   25-Oct-13 13:54   Temperature Temperature (F) 98.4   Celsius 36.8   Temperature Source Oral   Pulse Pulse 70   Respirations Respirations 18   Systolic BP Systolic BP 153   Diastolic BP (mmHg) Diastolic BP (mmHg) 87   Mean BP 103   Pulse Ox % Pulse Ox % 97   Brief Assessment:   Cardiac Regular    Respiratory clear BS    Gastrointestinal details normal Bowel sounds normal  mild to moderate disttension, bowel sounds positive, mild peritoneal signs, generalized tenderness.   Lab Results: Routine Chem:  20-Oct-13 17:31    Lipase  > 3000 (Result(s) reported on 23 Feb 2012 at 06:11PM.)  21-Oct-13 04:34    Lipase  2367 (Result(s) reported on 24 Feb 2012 at 05:29AM.)  22-Oct-13 10:23    Lipase  1249 (Result(s) reported on 25 Feb 2012 at 11:26AM.)  23-Oct-13 04:40    Lipase  1345 (Result(s) reported on 26 Feb 2012 at 05:19AM.)  24-Oct-13 04:00    Lipase  1449 (Result(s) reported on 27 Feb 2012 at 05:43AM.)  25-Oct-13 04:44    Lipase  1224 (Result(s) reported on 28 Feb 2012 at 05:36AM.)   Assessment/Plan:  Assessment/Plan:   Assessment 1) pancreatitis-etoh versus biliary.  positive cholelithiasis, possible cystic duct stone, negative hida previously.  mild peritoneal sign noted, but improved from yesterday,  Currently on abx. 2) h/o etoh abuse.    Plan 1) continue current, recheck labs tomorrow.  may need to repeat hida scan if not further improvement.   Electronic Signatures: Loistine Simas (MD)  (Signed 25-Oct-13 19:12)  Authored: Chief Complaint, VITAL SIGNS/ANCILLARY NOTES, Brief Assessment, Lab Results, Assessment/Plan   Last Updated: 25-Oct-13 19:12 by Loistine Simas (MD)

## 2014-08-23 NOTE — Consult Note (Signed)
Brief Consult Note: Diagnosis: acute pancreatitis.   Patient was seen by consultant.   Consult note dictated.   Recommend further assessment or treatment.   Comments: likely not GB related due to LFT pattern but ETOH/ 12 pack per day is suggestive of ETOH as source. No surgical plans, will follow.  Electronic Signatures: Florene Glen (MD)  (Signed 21-Oct-13 22:43)  Authored: Brief Consult Note   Last Updated: 21-Oct-13 22:43 by Florene Glen (MD)

## 2014-08-23 NOTE — Consult Note (Signed)
Chief Complaint:   Subjective/Chief Complaint feeling better, tolerating full liquids. no nausea or abdominal pain.   VITAL SIGNS/ANCILLARY NOTES: **Vital Signs.:   28-Oct-13 13:50   Temperature Temperature (F) 98.9   Celsius 37.1   Temperature Source oral   Respirations Respirations 16   Systolic BP Systolic BP 480   Diastolic BP (mmHg) Diastolic BP (mmHg) 91   Mean BP 102   Pulse Ox % Pulse Ox % 100   Pulse Ox Activity Level  At rest   Oxygen Delivery Room Air/ 21 %   Brief Assessment:   Cardiac Regular    Respiratory clear BS    Gastrointestinal details normal Soft  Nontender  Nondistended  No masses palpable  Bowel sounds normal   Lab Results: Routine Chem:  20-Oct-13 17:31    Lipase  > 3000 (Result(s) reported on 23 Feb 2012 at 06:11PM.)  21-Oct-13 04:34    Lipase  2367 (Result(s) reported on 24 Feb 2012 at 05:29AM.)  22-Oct-13 10:23    Lipase  1249 (Result(s) reported on 25 Feb 2012 at 11:26AM.)  23-Oct-13 04:40    Lipase  1345 (Result(s) reported on 26 Feb 2012 at 05:19AM.)  24-Oct-13 04:00    Lipase  1449 (Result(s) reported on 27 Feb 2012 at 05:43AM.)  25-Oct-13 04:44    Lipase  1224 (Result(s) reported on 28 Feb 2012 at 05:36AM.)  26-Oct-13 04:33    Lipase  1309 (Result(s) reported on 29 Feb 2012 at 05:16AM.)  27-Oct-13 04:51    Lipase  1001 (Result(s) reported on 01 Mar 2012 at 05:35AM.)  28-Oct-13 04:27    Lipase  865 (Result(s) reported on 02 Mar 2012 at 05:25AM.)   Assessment/Plan:  Assessment/Plan:   Assessment 1) acute pancreatitis-improving.  biliary versus ETOH related, likely the latter.  2) h/0 etoh abuse    Plan 1) continue current,  start low residue diet if lipase drops again in am.  coundessed d/c etoh.   Electronic Signatures: Loistine Simas (MD)  (Signed 28-Oct-13 15:56)  Authored: Chief Complaint, VITAL SIGNS/ANCILLARY NOTES, Brief Assessment, Lab Results, Assessment/Plan   Last Updated: 28-Oct-13 15:56 by Loistine Simas  (MD)

## 2014-08-27 NOTE — Consult Note (Signed)
GI Note: have seen and examined Mr Poyer and agree with Tobe Sos a/p.  diverticular bleed.   He is also due for colonoscopy for polyp surveillance given poor prep last year.  if large drop in Hgb or sig bleeding,  proceed with t RBC scanotherwise, go-lytely this evening and from 6 - 8 tomorrow morning for colonoscopy tomorrow.  cont to monitor hgb/ hemodynamics.   Electronic Signatures: Arther Dames (MD)  (Signed on 31-Mar-15 15:59)  Authored  Last Updated: 31-Mar-15 15:59 by Arther Dames (MD)

## 2014-08-27 NOTE — Discharge Summary (Signed)
PATIENT NAME:  Louis Gardner, Louis Gardner MR#:  [id number] DATE OF BIRTH:  1960/09/30  DATE OF ADMISSION:  08/02/2013 DATE OF DISCHARGE:  08/05/2013  CHIEF COMPLAINT:   Bright red blood per rectum.   DISCHARGE DIAGNOSES: 1. Lower gastrointestinal bleeding and then melena as well.  2. No source of the problem identified.  3. Anemia of chronic disease. 4. Hypertension.  5. Alcohol abuse.  6. Chronic kidney disease.  7. Anemia, mostly acute blood loss from gastrointestinal bleeding, on top of dilution.   PROCEDURES: Colonoscopy by Dr. Arther Dames showing diverticuli throughout the examined colon.  No obvious source of bleeding. Internal hemorrhoids.  No blood in colon.  Likely source of bleeding, diverticular.   LABORATORY DATA:  normal stool culture results. Hemoglobin at discharge 9.2, on admission 12.6, platelet count 234. White count 4.7. UDS positive for marihuana. LFTs: AST of 54, total protein 8.8. Other LFTs within normal limits. Admission creatinine 1.38, serum sodium 135, glucose 115, potassium 4.1.  The stool cultures were negative. Clostridium difficile antigen was negative.   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   ASSESSMENT: 1. Cholelithiasis.   2. Omental fat-containing periumbilical hernia.  3. Prostatomegaly.   FOLLOWUP: Follow up with GI in 2 weeks for bleeding and for follow up on pancreatic mass. Follow up with Dr. Clide Deutscher.  Dr. Clide Deutscher has assured me that patient follows up with GI for pancreatic head mass that could be a neuroendocrine tumor.   DISPOSITION: Home.   MEDICATIONS AT DISCHARGE: Metoprolol 25 mg twice daily, Feosol 325 mg 3 times daily. The patient states that he has been taking Prilosec  over-the-counter and we recommended to continue this medication.   HOSPITAL COURSE:  A 54 year old gentleman with history of hypertension, polysubstance abuse, drug abuse, alcohol abuse, diverticulosis, previous GI bleeding, comes with a history of bright red blood per rectum, and then melena.   The patient had a similar episode 3 days prior to admission with resolution the same day and then restarted again the day of admission with larger amount of blood.  The patient was lightheaded with some abdominal pain in the suprapubic area 3 or 4 out of 10 in intensity, nonradiating. CT scan of the abdomen was done showing the changes noticed above. The patient was admitted with a history of GI bleeding with CBC every 6 hours, orders for transfusion of hemoglobin below 7 and a gastroenterology consult.    Gastroenterology saw the patient in a consultation and they will follow up outpatient as well. His blood pressure was treated with metoprolol.  There was overall control during the hospitalization. The patient has his colonoscopy with the findings noted above and since he had acute blood loss anemia was discharged on iron.  As far as his other medical problems, they were stable.   I spent about 40 minutes discharging this patient.  Follow up with Dr. Clide Deutscher and Dr. Rayann Heman.   ____________________________ Udell Sink, MD rsg:dd D: 08/08/2013 21:43:46 ET T: 08/09/2013 00:33:03 ET JOB#: [id number]  cc: [place] Sink, MD, <Dictator> Ngwe A. Clide Deutscher, MD Arther Dames, MD  Cristi Loron MD ELECTRONICALLY SIGNED 08/10/2013 21:20

## 2014-08-27 NOTE — Consult Note (Signed)
PATIENT NAME:  Louis Gardner, Louis Gardner MR#:  [id number] DATE OF BIRTH:  02/14/1961  DATE OF CONSULTATION:  08/03/2013  REFERRING PHYSICIAN:  Aaron Mose. Hower, MD CONSULTING PHYSICIAN:  Corky Sox. Zettie Pho, PA-C  ATTENDING GASTROENTEROLOGIST:  Arther Dames, MD  REASON FOR CONSULTATION: Lower GI bleed.   HISTORY OF PRESENT ILLNESS: This is a pleasant 54 year old gentleman who presents with bright red blood per rectum for the past several days. He explains that it started out as bright red blood that was just on the toilet tissue when he was cleaning himself and this has progressed over the past several days into quite a bit of blood dripping into the toilet and mixed into the consistency of the stool. The color is described as bright red and he has not seen any signs of darker blood or melena. He denies any diarrhea or constipation and denies any recent straining. He does have a known past medical history significant for diverticulosis. His last colonoscopy was in 2014 but, unfortunately, there was a poor prep. On a remote colonoscopy in 2005, he was found to have colon polyps. A CT scan of the abdomen and pelvis was obtained on the patient, showing extensive diverticulosis but no signs of acute diverticulitis and there is no evidence of bowel wall thickening or obstruction. A C. diff was checked and was negative. His hemoglobin has continued to slowly trend down since admission and currently is at 10.9. His hemoglobin was ordered to be checked every 6 hours and he has continued to see bright red blood throughout the day today. He is starting to feel somewhat lightheaded, especially when getting up to use the bathroom, but other than that he is denying any abdominal pain, rectal pain, nausea, vomiting or trouble with his appetite. He is tolerating clear liquids today without difficulty. No fever or chills. No recent travel or sick contacts. No chest pain or shortness of breath.   PAST MEDICAL HISTORY: Diverticulosis,  hypertension, personal history of colon polyps.   SOCIAL HISTORY: The patient does have a history of tobacco and alcohol use and abuse. He does report daily alcohol use. He also admits to some marijuana use.   ALLERGIES: No known drug allergies.   HOME MEDICATIONS: Metoprolol.   FAMILY HISTORY: No known family history of GI malignancy, colon polyps or IBD.   PAST SURGICAL HISTORY: The patient did have a complication with bleeding peptic ulcers when he was 54 years old and had to have surgery for this.   REVIEW OF SYSTEMS: A 10-system  review of systems was obtained on the patient. Pertinent positives are mentioned above and otherwise negative.   OBJECTIVE: VITAL SIGNS: Blood pressure 137/79, heart rate 74, respirations 18, temp 98.3, bedside pulse ox is 98% on room air.  GENERAL: This is a pleasant 54 year old gentleman, accompanied by 2 family members, in no acute distress. Alert and oriented x 3.  HEAD: Atraumatic, normocephalic.  NECK: Supple. No lymphadenopathy noted.  HEENT: Sclerae anicteric. Mucous membranes moist.  LUNGS: Respirations are even and unlabored. Clear to auscultation bilateral anterior lung fields.  CARDIAC: Regular rate and rhythm. S1, S2 noted.  ABDOMEN: Soft, nontender, nondistended. Normoactive bowel sounds are noted in all 4 quadrants. No guarding or rebound. No masses, hernias or organomegaly appreciated.  PSYCHIATRIC: Appropriate mood and affect.  NEUROLOGIC: Cranial nerves II through XII are grossly intact.  RECTAL: Deferred.  EXTREMITIES: Negative for lower extremity edema, 2+ pulses noted bilaterally.   LABORATORY DATA:  Hemoglobin 10.9, hematocrit 40.2, white blood  cells 4.7, platelets 234, MCV is 83. Sodium 135, potassium 4.1, BUN 11, creatinine 1.38, glucose 115. C. diff is negative. Bilirubin 1.0, alk phos 62, ALT 58, AST 54. Lipase 267.   IMAGING: A CT scan of the abdomen and pelvis was obtained on the patient, showing extensive diverticulosis but no  signs of diverticulitis. No evidence of bowel wall thickening or obstruction. No signs of an acute abnormality. There were some stable chronic issues including cholelithiasis.   ASSESSMENT: 1.  Lower gastrointestinal bleed.  2.  Mild anemia.  3.  Extensive diverticulosis.  4.  Personal history of colon polyps.   PLAN: I have discussed this patient's case in detail with Dr. Arther Dames, who is involved throughout in the development of the patient's plan of care. We were able to review this patient's prior colonoscopy in 2014 and, unfortunately, it was an incomplete exam due to poor preparation. Therefore, due to his ongoing rectal bleeding that is persisting throughout the day today, now causing a decline in his hemoglobin, we do feel an appropriate next step would be to repeat the colonoscopy. Certainly, the differential diagnosis includes a diverticular bleed but also a personal history of colon polyps. It would be beneficial for the patient to have colonoscopy with adequate prep. We will keep the patient on clear liquid diet today and prep him this evening for a colonoscopy tomorrow. We do recommend continuing to keep a close eye on his hemoglobin and being prepared to transfuse as necessary. We would like to keep him n.p.o. after midnight and we will plan on a colonoscopy tomorrow. This was explained to the patient, who verbalized understanding and all questions were answered.   Thank you so much for this consultation and for allowing [place] to participate in the patient's plan of care.   ATTENDING GASTROENTEROLOGIST:  Arther Dames, MD  ____________________________ Corky Sox. Jadakiss Barish, PA-C kme:cs D: 08/03/2013 15:04:00 ET T: 08/03/2013 15:52:50 ET JOB#: [id number]  cc: Corky Sox. Jossette Zirbel, PA-C, <Dictator> [place] PA ELECTRONICALLY SIGNED 08/05/2013 12:15

## 2014-08-27 NOTE — H&P (Signed)
PATIENT NAME:  Louis Gardner, Louis Gardner MR#:  [id number] DATE OF BIRTH:  Sep 30, 1960  DATE OF ADMISSION:  08/02/2013  REFERRING PHYSICIAN:  Dr. Jasmine December.  PRIMARY CARE PHYSICIAN: Dr. Dema Severin.   CHIEF COMPLAINT: Bright red blood per rectum.   HISTORY OF PRESENT ILLNESS: A 54 year old African American gentleman with past medical history of GI bleeding and diverticulosis that was seen on colonoscopy in 2014, as well as hypertension presenting with bright red blood per rectum. describes 3 days' duration of bright red blood per rectum. First episode was approximately 3 days ago then subsequently stopped; however, restarted today and worsened. He describes this as bright red blood mixed with stool. Had associated lightheadedness after a large episode. Denies any chest pain, palpitations or shortness of breath. Denies nausea, vomiting, constipation. He does complain of having abdominal pain suprapubic in location, dull in quality 3-4/10 in intensity. No worsening or relieving factors, nonradiating. Upon arrival to the Emergency Department, he was hemodynamically stable and had a further large episode of bright red blood per rectum mixed with stool then subsequently admitted to the hospital. Currently without complaints.   REVIEW OF SYSTEMS:  CONSTITUTIONAL: Denies fever, fatigue, weakness.  EYES: Denies blurry, double vision, or eye pain.  EARS, NOSE AND THROAT:  Denies tinnitus, ear pain or hearing loss.  RESPIRATORY:  Denies cough, wheezes, shortness of breath.   CARDIOVASCULAR: Chest pain, palpitations, edema.  GASTROINTESTINAL: Denies nausea, vomiting, diarrhea. Positive for abdominal pain. Positive for bright red blood per rectum. GENITOURINARY: Denies dysuria, hematuria.  ENDOCRINE: Denies nocturia or thyroid problems.  HEMATOLOGIC AND LYMPHATIC:  Heme positive for bleeding. Denies any bruising.  SKIN: Denies rash or lesion.  MUSCULOSKELETAL: Denies pain in neck, back, shoulder, hips or arthritic symptoms.   NEUROLOGIC: Denies paralysis, paresthesias.  PSYCHIATRIC: Denies anxiety or depressive symptoms.   Otherwise, full review of systems performed is negative.    PAST MEDICAL HISTORY: Polysubstance abuse, tobacco abuse and alcohol abuse. GI bleed, diverticulosis, and hypertension.   SOCIAL HISTORY: Positive for tobacco use and alcohol use. Described as daily alcohol use. He states that he has had withdrawal symptoms before, but no withdrawal seizures. Positive for marijuana usage. Denies any IV drug usage.   ALLERGIES: No known drug allergies.   HOME MEDICATIONS: Include metoprolol 25 mg p.o. daily.   PHYSICAL EXAMINATION:  VITAL SIGNS: Temperature 98.4, heart rate 89, respirations 20, blood pressure 140/89, sating wellon room air. Weight 73.9 kg. BMI 24.1 GENERAL: Well-nourished, well-developed [place] American male appearing in no acute distress.   HEAD: Normocephalic, atraumatic.  EYES: Pupils equal, round, reactive to light. Extraocular muscles are intact. No scleral icterus.  MOUTH: Moist mucous membranes. Dentition intact. No abscess noted.  EARS, NOSE AND THROAT: Clear. No exudates. No external lesions.   NECK: Supple. No thyromegaly. No nodules. No JVD.  PULMONARY: Clear to auscultation bilaterally without wheezes, rales or rhonchi. No use of accessory of muscles. Good respiratory effort.  CHEST: Nontender to palpation.  CARDIOVASCULAR: S1, S2, regular rate and rhythm. No murmurs, rubs or gallops. No edema. Pedal pulses 2+ bilaterally.  GASTROINTESTINAL:  Soft, nontender, nondistended. No masses. Positive bowel sounds.  No hepatosplenomegaly.  MUSCULOSKELETAL: No swelling, clubbing or edema. Range of motion is full in all extremities.   NEUROLOGIC: Cranial nerves II- XII. No gross neurologic deficits. Sensation intact. Reflexes intact.   SKIN: No ulcerations, lesions, rashes, cyanosis. Skin warm, dry. Turgor intact.  PSYCHIATRIC: Mood and affect within normal limits. The patient is  alert and oriented x 3. Insight  and judgment intact.    LABORATORY DATA: CT abdomen performed revealing no acute intraabdominal process. There is note of a stable 6 mm hyper-enhancing lesion in the pancreatic head , which is stable since October 2013.   Clostridium difficile is negative. Remainder of laboratory data: Sodium 135, potassium 4.1, chloride 102, bicarbonate 28, BUN 11, creatinine 1.38, glucose 115, total protein 8.8, AST 54 remainder of LFTs within normal limits. WBC 4.7, hemoglobin 12.6, platelets of 234,000.   ASSESSMENT AND PLAN: A 54 year old [place] American gentleman with history of GI bleed  as well as diverticulosis presenting with bright red blood per rectum and lower GI bleed. Likely diverticular etiology given history trend.  CBC q.6 hours. Transfusion threshold hemoglobin less than 7. We will consult gastroenterology. Avoid any anticoagulants.  2. Hypertension. Continue with Lopressor.  3. Alcohol abuse. No current withdrawal. Last drink greater than 24 hours ago. We will initiate CIWA protocol.  4. Venous thromboembolism prophylaxis with sequential compression devices.   CODE STATUS: The patient is full code.   TIME SPENT: 45 minutes   ____________________________ Aaron Mose. Trigg Delarocha, MD dkh:tc D: 08/02/2013 20:54:48 ET T: 08/02/2013 22:57:39 ET JOB#: [id number]  cc: Aaron Mose. Alixandrea Milleson, MD, <Dictator> Cashius Grandstaff Woodfin Ganja MD ELECTRONICALLY SIGNED 08/03/2013 20:26

## 2014-08-27 NOTE — Consult Note (Signed)
Details:   - Colonoscopy done today for hematochezia.    Findings:  Severe diverticulsos throughout colon.  No blood in colon at all.   Recs: - f/u Hgb until stable. - safe for d/c once Hgb stable.  - if further bleeding, perform tagged rbc scan.   Electronic Signatures: Arther Dames (MD)  (Signed 01-Apr-15 11:50)  Authored: Details   Last Updated: 01-Apr-15 11:50 by Arther Dames (MD)

## 2015-03-27 DIAGNOSIS — I10 Essential (primary) hypertension: Secondary | ICD-10-CM

## 2015-03-27 HISTORY — DX: Essential (primary) hypertension: I10

## 2015-04-17 DIAGNOSIS — R079 Chest pain, unspecified: Secondary | ICD-10-CM | POA: Insufficient documentation

## 2015-04-17 DIAGNOSIS — R0789 Other chest pain: Secondary | ICD-10-CM | POA: Insufficient documentation

## 2015-04-17 HISTORY — DX: Other chest pain: R07.89

## 2015-07-04 ENCOUNTER — Other Ambulatory Visit: Payer: Self-pay | Admitting: Orthopedic Surgery

## 2015-07-04 DIAGNOSIS — M4726 Other spondylosis with radiculopathy, lumbar region: Secondary | ICD-10-CM

## 2015-07-25 ENCOUNTER — Ambulatory Visit: Payer: Self-pay

## 2016-03-18 ENCOUNTER — Encounter: Payer: Self-pay | Admitting: *Deleted

## 2016-03-19 ENCOUNTER — Other Ambulatory Visit: Payer: Self-pay | Admitting: Gastroenterology

## 2016-03-19 ENCOUNTER — Encounter: Payer: Self-pay | Admitting: *Deleted

## 2016-03-19 ENCOUNTER — Ambulatory Visit: Payer: Medicaid Other | Admitting: Anesthesiology

## 2016-03-19 ENCOUNTER — Encounter
Admission: RE | Disposition: A | Discharge status: Home / Self Care | Payer: Self-pay | Source: Ambulatory Visit | Attending: Gastroenterology

## 2016-03-19 ENCOUNTER — Ambulatory Visit
Admission: RE | Admit: 2016-03-19 | Discharge: 2016-03-19 | Disposition: A | Discharge status: Home / Self Care | Payer: Medicaid Other | Source: Ambulatory Visit | Attending: Gastroenterology | Admitting: Gastroenterology

## 2016-03-19 DIAGNOSIS — I10 Essential (primary) hypertension: Secondary | ICD-10-CM | POA: Insufficient documentation

## 2016-03-19 DIAGNOSIS — K2970 Gastritis, unspecified, without bleeding: Secondary | ICD-10-CM | POA: Insufficient documentation

## 2016-03-19 DIAGNOSIS — F329 Major depressive disorder, single episode, unspecified: Secondary | ICD-10-CM | POA: Diagnosis not present

## 2016-03-19 DIAGNOSIS — G4730 Sleep apnea, unspecified: Secondary | ICD-10-CM | POA: Insufficient documentation

## 2016-03-19 DIAGNOSIS — R1012 Left upper quadrant pain: Secondary | ICD-10-CM | POA: Insufficient documentation

## 2016-03-19 DIAGNOSIS — M1990 Unspecified osteoarthritis, unspecified site: Secondary | ICD-10-CM | POA: Diagnosis not present

## 2016-03-19 DIAGNOSIS — R1013 Epigastric pain: Secondary | ICD-10-CM

## 2016-03-19 DIAGNOSIS — K269 Duodenal ulcer, unspecified as acute or chronic, without hemorrhage or perforation: Secondary | ICD-10-CM | POA: Diagnosis not present

## 2016-03-19 DIAGNOSIS — K922 Gastrointestinal hemorrhage, unspecified: Secondary | ICD-10-CM

## 2016-03-19 HISTORY — PX: ESOPHAGOGASTRODUODENOSCOPY (EGD) WITH PROPOFOL: SHX5813

## 2016-03-19 HISTORY — DX: Major depressive disorder, single episode, unspecified: F32.9

## 2016-03-19 HISTORY — DX: Depression, unspecified: F32.A

## 2016-03-19 HISTORY — DX: Sleep apnea, unspecified: G47.30

## 2016-03-19 HISTORY — DX: Essential (primary) hypertension: I10

## 2016-03-19 HISTORY — DX: Unspecified osteoarthritis, unspecified site: M19.90

## 2016-03-19 LAB — CBC WITH DIFFERENTIAL/PLATELET
BASOS ABS: 0 10*3/uL (ref 0–0.1)
Basophils Relative: 0 %
EOS PCT: 1 %
Eosinophils Absolute: 0.1 10*3/uL (ref 0–0.7)
HCT: 42.4 % (ref 40.0–52.0)
Hemoglobin: 13.7 g/dL (ref 13.0–18.0)
LYMPHS PCT: 24 %
Lymphs Abs: 1.3 10*3/uL (ref 1.0–3.6)
MCH: 26.4 pg (ref 26.0–34.0)
MCHC: 32.3 g/dL (ref 32.0–36.0)
MCV: 81.7 fL (ref 80.0–100.0)
MONO ABS: 0.8 10*3/uL (ref 0.2–1.0)
Monocytes Relative: 15 %
Neutro Abs: 3.2 10*3/uL (ref 1.4–6.5)
Neutrophils Relative %: 60 %
PLATELETS: 247 10*3/uL (ref 150–440)
RBC: 5.19 MIL/uL (ref 4.40–5.90)
RDW: 14.7 % — AB (ref 11.5–14.5)
WBC: 5.4 10*3/uL (ref 3.8–10.6)

## 2016-03-19 LAB — PROTIME-INR
INR: 1.07
Prothrombin Time: 13.9 seconds (ref 11.4–15.2)

## 2016-03-19 SURGERY — ESOPHAGOGASTRODUODENOSCOPY (EGD) WITH PROPOFOL
Anesthesia: General

## 2016-03-19 MED ORDER — FENTANYL CITRATE (PF) 100 MCG/2ML IJ SOLN
INTRAMUSCULAR | Status: DC | PRN
  Administered 2016-03-19: 50 ug via INTRAVENOUS

## 2016-03-19 MED ORDER — PROPOFOL 500 MG/50ML IV EMUL
INTRAVENOUS | Status: DC | PRN
  Administered 2016-03-19: 120 ug/kg/min via INTRAVENOUS

## 2016-03-19 MED ORDER — MIDAZOLAM HCL 2 MG/2ML IJ SOLN
INTRAMUSCULAR | Status: DC | PRN
  Administered 2016-03-19: 1 mg via INTRAVENOUS

## 2016-03-19 MED ORDER — SODIUM CHLORIDE 0.9 % IV SOLN: INTRAVENOUS | Status: DC

## 2016-03-19 MED ORDER — SODIUM CHLORIDE 0.9 % IV SOLN
INTRAVENOUS | Status: DC
  Administered 2016-03-19: 1000 mL via INTRAVENOUS

## 2016-03-19 MED ORDER — LIDOCAINE HCL (CARDIAC) 20 MG/ML IV SOLN
INTRAVENOUS | Status: DC | PRN
  Administered 2016-03-19: 30 mg via INTRAVENOUS

## 2016-03-19 NOTE — Transfer of Care (Signed)
Immediate Anesthesia Transfer of Care Note  Patient: Louis Gardner  Procedure(s) Performed: Procedure(s): ESOPHAGOGASTRODUODENOSCOPY (EGD) WITH PROPOFOL (N/A)  Patient Location: PACU  Anesthesia Type:General  Level of Consciousness: awake and sedated  Airway & Oxygen Therapy: Patient Spontanous Breathing and Patient connected to nasal cannula oxygen  Post-op Assessment: Report given to RN and Post -op Vital signs reviewed and stable  Post vital signs: Reviewed and stable  Last Vitals:  Vitals:   03/19/16 1204  BP: (!) 150/120  Pulse: 99  Resp: 17  Temp: 36.3 C    Last Pain:  Vitals:   03/19/16 1204  TempSrc: Tympanic         Complications: No apparent anesthesia complications

## 2016-03-19 NOTE — Anesthesia Postprocedure Evaluation (Signed)
Anesthesia Post Note  Patient: Louis Gardner  Procedure(s) Performed: Procedure(s) (LRB): ESOPHAGOGASTRODUODENOSCOPY (EGD) WITH PROPOFOL (N/A)  Patient location during evaluation: PACU Anesthesia Type: General Level of consciousness: awake Pain management: pain level controlled Vital Signs Assessment: post-procedure vital signs reviewed and stable Respiratory status: spontaneous breathing Cardiovascular status: stable Anesthetic complications: no    Last Vitals:  Vitals:   03/19/16 1204 03/19/16 1329  BP: (!) 150/120 100/64  Pulse: 99 82  Resp: 17 15  Temp: 36.3 C 36.2 C    Last Pain:  Vitals:   03/19/16 1329  TempSrc: Tympanic                 VAN STAVEREN,Gorje Iyer

## 2016-03-19 NOTE — Anesthesia Preprocedure Evaluation (Signed)
Anesthesia Evaluation  Patient identified by MRN, date of birth, ID band Patient awake    Reviewed: Allergy & Precautions, NPO status , Patient's Chart, lab work & pertinent test results  Airway Mallampati: II       Dental  (+) Teeth Intact   Pulmonary sleep apnea , Current Smoker,    breath sounds clear to auscultation       Cardiovascular hypertension, Pt. on medications  Rhythm:Regular Rate:Normal     Neuro/Psych Depression    GI/Hepatic negative GI ROS, Neg liver ROS,   Endo/Other  negative endocrine ROS  Renal/GU negative Renal ROS     Musculoskeletal   Abdominal Normal abdominal exam  (+)   Peds  Hematology negative hematology ROS (+)   Anesthesia Other Findings   Reproductive/Obstetrics                             Anesthesia Physical Anesthesia Plan  ASA: III  Anesthesia Plan: General   Post-op Pain Management:    Induction: Intravenous  Airway Management Planned: Natural Airway and Nasal Cannula  Additional Equipment:   Intra-op Plan:   Post-operative Plan:   Informed Consent: I have reviewed the patients History and Physical, chart, labs and discussed the procedure including the risks, benefits and alternatives for the proposed anesthesia with the patient or authorized representative who has indicated his/her understanding and acceptance.     Plan Discussed with: CRNA  Anesthesia Plan Comments:         Anesthesia Quick Evaluation

## 2016-03-19 NOTE — Anesthesia Procedure Notes (Signed)
Performed by: COOK-MARTIN, Kania Regnier Pre-anesthesia Checklist: Patient identified, Emergency Drugs available, Suction available, Patient being monitored and Timeout performed Patient Re-evaluated:Patient Re-evaluated prior to inductionOxygen Delivery Method: Nasal cannula Preoxygenation: Pre-oxygenation with 100% oxygen Intubation Type: IV induction Airway Equipment and Method: Bite block Placement Confirmation: positive ETCO2 and CO2 detector     

## 2016-03-19 NOTE — Op Note (Signed)
[facility] Gastroenterology Patient Name: Louis Gardner Procedure Date: 03/19/2016 1:03 PM MRN: [id number] Account #: [id number] Date of Birth: 04-Feb-1961 Admit Type: Outpatient Age: 55 Room: [facility] ENDO ROOM 3 Gender: Male Note Status: Finalized Procedure:            Upper GI endoscopy Indications:          Epigastric abdominal pain, Dyspepsia Providers:            Lollie Sails, MD Referring MD:         No Local Md, MD (Referring MD) Medicines:            Monitored Anesthesia Care Complications:        No immediate complications. Procedure:            Pre-Anesthesia Assessment:                       - ASA Grade Assessment: II - A patient with mild                        systemic disease.                       After obtaining informed consent, the endoscope was                        passed under direct vision. Throughout the procedure,                        the patient's blood pressure, pulse, and oxygen                        saturations were monitored continuously. The Endoscope                        was introduced through the mouth, and advanced to the                        third part of duodenum. The upper GI endoscopy was                        accomplished without difficulty. The patient tolerated                        the procedure well. Findings:      The Z-line was variable. Biopsies were taken with a cold forceps for       histology.      The exam of the esophagus was otherwise normal.      Diffuse mild inflammation characterized by congestion (edema) and       erythema was found in the gastric body. Biopsies were taken with a cold       forceps for histology.      The cardia and gastric fundus were normal on retroflexion.      A single localized erosion without bleeding was found in the duodenal       bulb, no ulcer noted.      There is an atypical fold in the posterior bulb, otherwise normal       appearance. The duodenum was otherwise  normal through the third portion. Impression:           - Z-line variable. Biopsied.                       -  Gastritis. Biopsied.                       - Duodenal erosion without bleeding. Recommendation:       - Discharge patient to home.                       - Perform a small bowel follow through at appointment                        to be scheduled.                       - Use Protonix (pantoprazole) 40 mg PO daily daily.                       - Return to GI clinic in 3 weeks. Lollie Sails, MD 03/19/2016 1:28:40 PM This report has been signed electronically. Number of Addenda: 0 Note Initiated On: 03/19/2016 1:03 PM      [facility]

## 2016-03-19 NOTE — H&P (Signed)
Outpatient short stay form Pre-procedure 03/19/2016 12:55 PM Lollie Sails MD  Primary Physician: Gennette Pac NP  Reason for visit:  EGD  History of present illness:  Patient is a 55 year old male presenting today as above. He has a personal history of taking BC powders and NSAIDs for leg and back pain. She hasn't taken taking any for several weeks at this point. Then there is the exception of taking Advil. He was started on a proton pump inhibitor, Protonix. He has seen some black stools intermittently over the past couple of months. He continues to have epigastric/left upper quadrant pain. He has had no hematemesis. CBC and pro time check today were normal.    Current Facility-Administered Medications:  .  0.9 %  sodium chloride infusion, , Intravenous, Continuous, Lollie Sails, MD, Last Rate: 20 mL/hr at 03/19/16 1247, 1,000 mL at 03/19/16 1247 .  0.9 %  sodium chloride infusion, , Intravenous, Continuous, Lollie Sails, MD  Prescriptions Prior to Admission  Medication Sig Dispense Refill Last Dose  . buPROPion (WELLBUTRIN SR) 150 MG 12 hr tablet Take 150 mg by mouth 2 (two) times daily.   03/18/2016 at 0900  . hydrochlorothiazide (HYDRODIURIL) 12.5 MG tablet Take 12.5 mg by mouth daily.   03/18/2016 at 0900  . pantoprazole (PROTONIX) 40 MG tablet Take 40 mg by mouth daily.   03/18/2016 at 0900     No Known Allergies   Past Medical History:  Diagnosis Date  . Arthritis   . Depression   . Hypertension   . Sleep apnea     Review of systems:      Physical Exam    Heart and lungs: Regular rate and rhythm without rub or gallop, lungs are bilaterally clear.    HEENT: Normocephalic atraumatic eyes are anicteric    Other:     Pertinant exam for procedure: Soft positive tender to palpation particularly in the left upper quadrant and epigastrium. He does have a umbilical hernia which is protuberant area and it is nontender. Patient states this does not become  tender for him. He does however state that he has had probably 6 months or a year.    Planned proceedures: EGD and indicated procedures. I have discussed the risks benefits and complications of procedures to include not limited to bleeding, infection, perforation and the risk of sedation and the patient wishes to proceed.    Lollie Sails, MD Gastroenterology 03/19/2016  12:55 PM

## 2016-03-20 ENCOUNTER — Encounter: Payer: Self-pay | Admitting: Gastroenterology

## 2016-03-20 LAB — SURGICAL PATHOLOGY

## 2016-03-26 ENCOUNTER — Ambulatory Visit
Admission: RE | Admit: 2016-03-26 | Discharge: 2016-03-26 | Disposition: A | Discharge status: Home / Self Care | Payer: Medicaid Other | Source: Ambulatory Visit | Attending: Gastroenterology | Admitting: Gastroenterology

## 2016-03-26 DIAGNOSIS — K922 Gastrointestinal hemorrhage, unspecified: Secondary | ICD-10-CM | POA: Insufficient documentation

## 2016-03-26 DIAGNOSIS — R1013 Epigastric pain: Secondary | ICD-10-CM | POA: Insufficient documentation

## 2016-07-23 ENCOUNTER — Other Ambulatory Visit: Payer: Self-pay | Admitting: Physician Assistant

## 2016-07-23 DIAGNOSIS — M4726 Other spondylosis with radiculopathy, lumbar region: Secondary | ICD-10-CM

## 2016-08-07 ENCOUNTER — Ambulatory Visit: Payer: Medicaid Other

## 2017-09-23 ENCOUNTER — Encounter
Admit: 2017-09-23 | Discharge: 2017-10-22 | Payer: PRIVATE HEALTH INSURANCE | Attending: Rehabilitative and Restorative Service Providers" | Primary: Rehabilitative and Restorative Service Providers"

## 2017-09-23 DIAGNOSIS — G8929 Other chronic pain: Secondary | ICD-10-CM

## 2017-09-23 DIAGNOSIS — M545 Low back pain: Secondary | ICD-10-CM

## 2017-09-23 DIAGNOSIS — M25551 Pain in right hip: Principal | ICD-10-CM

## 2017-11-13 ENCOUNTER — Ambulatory Visit
Admit: 2017-11-13 | Discharge: 2017-11-21 | Payer: PRIVATE HEALTH INSURANCE | Attending: Rehabilitative and Restorative Service Providers" | Primary: Rehabilitative and Restorative Service Providers"

## 2017-11-13 DIAGNOSIS — M25551 Pain in right hip: Principal | ICD-10-CM

## 2017-11-13 DIAGNOSIS — M545 Low back pain: Secondary | ICD-10-CM

## 2017-11-13 DIAGNOSIS — G8929 Other chronic pain: Secondary | ICD-10-CM

## 2017-12-02 DIAGNOSIS — R29898 Other symptoms and signs involving the musculoskeletal system: Secondary | ICD-10-CM

## 2017-12-02 DIAGNOSIS — M5441 Lumbago with sciatica, right side: Secondary | ICD-10-CM

## 2017-12-02 DIAGNOSIS — R569 Unspecified convulsions: Secondary | ICD-10-CM | POA: Insufficient documentation

## 2017-12-02 DIAGNOSIS — G8929 Other chronic pain: Secondary | ICD-10-CM | POA: Insufficient documentation

## 2017-12-02 HISTORY — DX: Other chronic pain: G89.29

## 2017-12-02 HISTORY — DX: Other symptoms and signs involving the musculoskeletal system: R29.898

## 2017-12-02 HISTORY — DX: Unspecified convulsions: R56.9

## 2017-12-02 HISTORY — DX: Lumbago with sciatica, right side: M54.41

## 2017-12-09 ENCOUNTER — Other Ambulatory Visit: Payer: Self-pay | Admitting: Acute Care

## 2017-12-09 DIAGNOSIS — R569 Unspecified convulsions: Secondary | ICD-10-CM

## 2017-12-18 ENCOUNTER — Encounter
Admit: 2017-12-18 | Discharge: 2017-12-21 | Payer: PRIVATE HEALTH INSURANCE | Attending: Rehabilitative and Restorative Service Providers" | Primary: Rehabilitative and Restorative Service Providers"

## 2017-12-23 ENCOUNTER — Ambulatory Visit
Admission: RE | Admit: 2017-12-23 | Discharge: 2017-12-23 | Disposition: A | Discharge status: Home / Self Care | Payer: Medicaid Other | Source: Ambulatory Visit | Attending: Acute Care | Admitting: Acute Care

## 2017-12-23 DIAGNOSIS — R29898 Other symptoms and signs involving the musculoskeletal system: Secondary | ICD-10-CM | POA: Insufficient documentation

## 2017-12-23 DIAGNOSIS — R569 Unspecified convulsions: Secondary | ICD-10-CM | POA: Insufficient documentation

## 2017-12-23 DIAGNOSIS — M48061 Spinal stenosis, lumbar region without neurogenic claudication: Secondary | ICD-10-CM | POA: Diagnosis not present

## 2017-12-23 DIAGNOSIS — M5136 Other intervertebral disc degeneration, lumbar region: Secondary | ICD-10-CM | POA: Insufficient documentation

## 2018-01-06 DIAGNOSIS — Z87898 Personal history of other specified conditions: Secondary | ICD-10-CM

## 2018-01-06 HISTORY — DX: Personal history of other specified conditions: Z87.898

## 2018-11-26 NOTE — Progress Notes (Deleted)
Patient's Name: Louis Gardner  MRN: [id number]  Referring Provider: Gennette Pac, FNP  DOB: 1961-03-06  PCP: Patient, No Pcp Per  DOS: 11/30/2018  Note by: Gillis Santa, MD  Service setting: Ambulatory outpatient  Specialty: Interventional Pain Management  Location: [facility] (AMB) Pain Management Facility  Visit type: Initial Patient Evaluation  Patient type: New Patient   Primary Reason(s) for Visit: Encounter for initial evaluation of one or more chronic problems (new to examiner) potentially causing chronic pain, and posing a threat to normal musculoskeletal function. (Level of risk: High) CC: No chief complaint on file.  HPI  [name] is a 58 y.o. year old, male patient, who comes today to see [place] for the first time for an initial evaluation of his chronic pain. He does not have a problem list on file. Today he comes in for evaluation of his No chief complaint on file.  Pain Assessment: Location:     Radiating:   Onset:   Duration:   Quality:   Severity:  /10 (subjective, self-reported pain score)  Note: Reported level is compatible with observation.                         When using our objective Pain Scale, levels between 6 and 10/10 are said to belong in an emergency room, as it progressively worsens from a 6/10, described as severely limiting, requiring emergency care not usually available at an outpatient pain management facility. At a 6/10 level, communication becomes difficult and requires great effort. Assistance to reach the emergency department may be required. Facial flushing and profuse sweating along with potentially dangerous increases in heart rate and blood pressure will be evident. Effect on ADL:   Timing:   Modifying factors:   BP:    HR:    Onset and Duration: {Hx; Onset and Duration:210120511} Cause of pain: {Hx; Cause:210120521} Severity: {Pain Severity:210120502} Timing: {Symptoms; Timing:210120501} Aggravating Factors: {Causes; Aggravating pain  factors:210120507} Alleviating Factors: {Causes; Alleviating Factors:210120500} Associated Problems: {Hx; Associated problems:210120515} Quality of Pain: {Hx; Symptom quality or Descriptor:210120531} Previous Examinations or Tests: {Hx; Previous examinations or test:210120529} Previous Treatments: {Hx; Previous Treatment:210120503}  The patient comes into the clinics today for the first time for a chronic pain management evaluation. ***  Today I took the time to provide the patient with information regarding my pain practice. The patient was informed that my practice is divided into two sections: an interventional pain management section, as well as a completely separate and distinct medication management section. I explained that I have procedure days for my interventional therapies, and evaluation days for follow-ups and medication management. Because of the amount of documentation required during both, they are kept separated. This means that there is the possibility that he may be scheduled for a procedure on one day, and medication management the next. I have also informed him that because of staffing and facility limitations, I no longer take patients for medication management only. To illustrate the reasons for this, I gave the patient the example of surgeons, and how inappropriate it would be to refer a patient to his/her care, just to write for the post-surgical antibiotics on a surgery done by a different surgeon.   Because interventional pain management is my board-certified specialty, the patient was informed that joining my practice means that they are open to any and all interventional therapies. I made it clear that this does not mean that they will be forced to have any procedures done.  What this means is that I believe interventional therapies to be essential part of the diagnosis and proper management of chronic pain conditions. Therefore, patients not interested in these interventional  alternatives will be better served under the care of a different practitioner.  The patient was also made aware of my Comprehensive Pain Management Safety Guidelines where by joining my practice, they limit all of their nerve blocks and joint injections to those done by our practice, for as long as we are retained to manage their care.   Historic Controlled Substance Pharmacotherapy Review  PMP and historical list of controlled substances: ***  Highest opioid analgesic regimen found: ***  Most recent opioid analgesic: ***  Current opioid analgesics:  ***  Highest recorded MME/day: *** mg/day MME/day: *** mg/day  Medications: The patient did not bring the medication(s) to the appointment, as requested in our "New Patient Package" Pharmacodynamics: Desired effects: Analgesia: The patient reports >50% benefit. Reported improvement in function: The patient reports medication allows him to accomplish basic ADLs. Clinically meaningful improvement in function (CMIF): Sustained CMIF goals met Perceived effectiveness: Described as relatively effective, allowing for increase in activities of daily living (ADL) Undesirable effects: Side-effects or Adverse reactions: None reported Historical Monitoring: The patient  reports no history of drug use. List of all UDS Test(s): Lab Results  Component Value Date   MDMA NEGATIVE 08/04/2013   COCAINSCRNUR NEGATIVE 08/04/2013   PCPSCRNUR NEGATIVE 08/04/2013   THCU POSITIVE 08/04/2013   List of other Serum/Urine Drug Screening Test(s):  Lab Results  Component Value Date   COCAINSCRNUR NEGATIVE 08/04/2013   THCU POSITIVE 08/04/2013   Historical Background Evaluation: [place] PMP: PDMP not reviewed this encounter. Six (6) year initial data search conducted.             PMP NARX Score Report:  Narcotic: *** Sedative: *** Stimulant: *** [place] Department of public safety, offender search: Editor, commissioning Information) Non-contributory Risk Assessment  Profile: Aberrant behavior: None observed or detected today Risk factors for fatal opioid overdose: None identified today PMP NARX Overdose Risk Score: *** Fatal overdose hazard ratio (HR): Calculation deferred Non-fatal overdose hazard ratio (HR): Calculation deferred Risk of opioid abuse or dependence: 0.7-3.0% with doses ? 36 MME/day and 6.1-26% with doses ? 120 MME/day. Substance use disorder (SUD) risk level: See below Personal History of Substance Abuse (SUD-Substance use disorder):  Alcohol:    Illegal Drugs:    Rx Drugs:    ORT Risk Level calculation:    ORT Scoring interpretation table:  Score <3 = Low Risk for SUD  Score between 4-7 = Moderate Risk for SUD  Score >8 = High Risk for Opioid Abuse   PHQ-2 Depression Scale:  Total score:    PHQ-2 Scoring interpretation table: (Score and probability of major depressive disorder)  Score 0 = No depression  Score 1 = 15.4% Probability  Score 2 = 21.1% Probability  Score 3 = 38.4% Probability  Score 4 = 45.5% Probability  Score 5 = 56.4% Probability  Score 6 = 78.6% Probability   PHQ-9 Depression Scale:  Total score:    PHQ-9 Scoring interpretation table:  Score 0-4 = No depression  Score 5-9 = Mild depression  Score 10-14 = Moderate depression  Score 15-19 = Moderately severe depression  Score 20-27 = Severe depression (2.4 times higher risk of SUD and 2.89 times higher risk of overuse)   Pharmacologic Plan: As per protocol, I have not taken over any controlled substance management, pending the results of ordered tests  and/or consults.            Initial impression: Pending review of available data and ordered tests.  Meds   Current Outpatient Medications:  .  buPROPion (WELLBUTRIN SR) 150 MG 12 hr tablet, Take 150 mg by mouth 2 (two) times daily., Disp: , Rfl:  .  hydrochlorothiazide (HYDRODIURIL) 12.5 MG tablet, Take 12.5 mg by mouth daily., Disp: , Rfl:  .  pantoprazole (PROTONIX) 40 MG tablet, Take 40 mg by mouth  daily., Disp: , Rfl:   Imaging Review  Cervical Imaging: Cervical MR wo contrast: No results found for this or any previous visit. Cervical MR wo contrast: No procedure found. Cervical MR w/wo contrast: No results found for this or any previous visit. Cervical MR w contrast: No results found for this or any previous visit. Cervical CT wo contrast: No results found for this or any previous visit. Cervical CT w/wo contrast: No results found for this or any previous visit. Cervical CT w/wo contrast: No results found for this or any previous visit. Cervical CT w contrast: No results found for this or any previous visit. Cervical CT outside: No results found for this or any previous visit. Cervical DG 1 view: No results found for this or any previous visit. Cervical DG 2-3 views: No results found for this or any previous visit. Cervical DG F/E views: No results found for this or any previous visit. Cervical DG 2-3 clearing views: No results found for this or any previous visit. Cervical DG Bending/F/E views: No results found for this or any previous visit. Cervical DG complete: No results found for this or any previous visit. Cervical DG Myelogram views: No results found for this or any previous visit. Cervical DG Myelogram views: No results found for this or any previous visit. Cervical Discogram views: No results found for this or any previous visit.  Shoulder Imaging: Shoulder-R MR w contrast: No results found for this or any previous visit. Shoulder-L MR w contrast: No results found for this or any previous visit. Shoulder-R MR w/wo contrast: No results found for this or any previous visit. Shoulder-L MR w/wo contrast: No results found for this or any previous visit. Shoulder-R MR wo contrast: No results found for this or any previous visit. Shoulder-L MR wo contrast: No results found for this or any previous visit. Shoulder-R CT w contrast: No results found for this or any previous  visit. Shoulder-L CT w contrast: No results found for this or any previous visit. Shoulder-R CT w/wo contrast: No results found for this or any previous visit. Shoulder-L CT w/wo contrast: No results found for this or any previous visit. Shoulder-R CT wo contrast: No results found for this or any previous visit. Shoulder-L CT wo contrast: No results found for this or any previous visit. Shoulder-R DG Arthrogram: No results found for this or any previous visit. Shoulder-L DG Arthrogram: No results found for this or any previous visit. Shoulder-R DG 1 view: No results found for this or any previous visit. Shoulder-L DG 1 view: No results found for this or any previous visit. Shoulder-R DG: No results found for this or any previous visit. Shoulder-L DG: No results found for this or any previous visit.  Thoracic Imaging: Thoracic MR wo contrast: No results found for this or any previous visit. Thoracic MR wo contrast: No procedure found. Thoracic MR w/wo contrast: No results found for this or any previous visit. Thoracic MR w contrast: No results found for this or any  previous visit. Thoracic CT wo contrast: No results found for this or any previous visit. Thoracic CT w/wo contrast: No results found for this or any previous visit. Thoracic CT w/wo contrast: No results found for this or any previous visit. Thoracic CT w contrast: No results found for this or any previous visit. Thoracic DG 2-3 views: No results found for this or any previous visit. Thoracic DG 4 views: No results found for this or any previous visit. Thoracic DG: No results found for this or any previous visit. Thoracic DG w/swimmers view: No results found for this or any previous visit. Thoracic DG Myelogram views: No results found for this or any previous visit. Thoracic DG Myelogram views: No results found for this or any previous visit.  Lumbosacral Imaging: Lumbar MR wo contrast:  Results for orders placed during the  hospital encounter of 12/23/17  MR LUMBAR SPINE WO CONTRAST   Narrative CLINICAL DATA:  58 year old male with lumbar back pain radiating down both legs to the toes for 1-2 years. No known injury. Seizure-like activity.  EXAM: MRI LUMBAR SPINE WITHOUT CONTRAST  TECHNIQUE: Multiplanar, multisequence MR imaging of the lumbar spine was performed. No intravenous contrast was administered.  COMPARISON:  Small-bowel follow-through 03/26/2016. CT Abdomen and Pelvis 08/02/2013.  FINDINGS: Segmentation:  Normal on the comparisons.  Alignment: Stable since 2015. Mild straightening of lumbar lordosis. Mild chronic retrolisthesis of L5 on S1.  Vertebrae: Anterior and rightward degenerative appearing endplate marrow edema at L5-S1 (series 7, image 6). Background bone marrow signal is within normal limits. Intact visible sacrum and SI joints. No other acute osseous abnormality identified.  Conus medullaris and cauda equina: Conus extends to the L1 level. No lower spinal cord or conus signal abnormality. Incidental fatty filum terminalis (normal variant series 9, image 15).  Paraspinal and other soft tissues: Stable and negative.  Disc levels:  T12-L1:  Negative.  L1-L2: Disc desiccation with mild disc space loss and circumferential disc bulge. No stenosis.  L2-L3: Mild far lateral disc bulging and endplate spurring. No stenosis.  L3-L4: Far lateral disc bulging and endplate spurring greater on the left. Borderline to mild facet hypertrophy. Mild left L3 neural foraminal stenosis (series 5, image 12), appears stable since 2015.  L4-L5: Mild circumferential disc bulge with endplate spurring, eccentric to the left. Mild facet hypertrophy. Mild epidural lipomatosis. Borderline to mild spinal stenosis. Mild to moderate left L4 foraminal stenosis. This level appears stable since 2015.  L5-S1: Chronic disc space loss with mild retrolisthesis. Mild vacuum disc. Right eccentric  circumferential disc bulge and endplate spurring with bulky right far lateral component. Mild facet hypertrophy. Mild epidural lipomatosis. No spinal or lateral recess stenosis. Mild to moderate right L5 neural foraminal stenosis appears stable since 2015.  IMPRESSION: 1. 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 Mild to moderate right L5 neural foraminal stenosis appears stable since the 2015 CT Abdomen and Pelvis. 2. No other acute osseous abnormality. lesser disc and endplate degeneration at L3-L4 and L4-L5. Up to mild spinal stenosis at the latter, and mild to moderate neural foraminal stenosis, which appears stable since 2015.   Electronically Signed   By: Genevie Ann M.D.   On: 12/23/2017 12:01    Lumbar MR wo contrast: No procedure found. Lumbar MR w/wo contrast: No results found for this or any previous visit. Lumbar MR w/wo contrast: No results found for this or any previous visit. Lumbar MR w contrast: No results found for this or  any previous visit. Lumbar CT wo contrast: No results found for this or any previous visit. Lumbar CT w/wo contrast: No results found for this or any previous visit. Lumbar CT w/wo contrast: No results found for this or any previous visit. Lumbar CT w contrast: No results found for this or any previous visit. Lumbar DG 1V: No results found for this or any previous visit. Lumbar DG 1V (Clearing): No results found for this or any previous visit. Lumbar DG 2-3V (Clearing): No results found for this or any previous visit. Lumbar DG 2-3 views: No results found for this or any previous visit. Lumbar DG (Complete) 4+V: No results found for this or any previous visit.       Lumbar DG F/E views: No results found for this or any previous visit.       Lumbar DG Bending views: No results found for this or any previous visit.       Lumbar DG Myelogram views: No results found for this or any previous  visit. Lumbar DG Myelogram: No results found for this or any previous visit. Lumbar DG Myelogram: No results found for this or any previous visit. Lumbar DG Myelogram: No results found for this or any previous visit. Lumbar DG Myelogram Lumbosacral: No results found for this or any previous visit. Lumbar DG Diskogram views: No results found for this or any previous visit. Lumbar DG Diskogram views: No results found for this or any previous visit. Lumbar DG Epidurogram OP: No results found for this or any previous visit. Lumbar DG Epidurogram IP: No results found for this or any previous visit.  Sacroiliac Joint Imaging: Sacroiliac Joint DG: No results found for this or any previous visit. Sacroiliac Joint MR w/wo contrast: No results found for this or any previous visit. Sacroiliac Joint MR wo contrast: No results found for this or any previous visit.  Spine Imaging: Whole Spine DG Myelogram views: No results found for this or any previous visit. Whole Spine MR Mets screen: No results found for this or any previous visit. Whole Spine MR Mets screen: No results found for this or any previous visit. Whole Spine MR w/wo: No results found for this or any previous visit. MRA Spinal Canal w/ cm: No results found for this or any previous visit. MRA Spinal Canal wo/ cm: No procedure found. MRA Spinal Canal w/wo cm: No results found for this or any previous visit. Spine Outside MR Films: No results found for this or any previous visit. Spine Outside CT Films: No results found for this or any previous visit. CT-Guided Biopsy: No results found for this or any previous visit. CT-Guided Needle Placement: No results found for this or any previous visit. DG Spine outside: No results found for this or any previous visit. IR Spine outside: No results found for this or any previous visit. NM Spine outside: No results found for this or any previous visit.  Hip Imaging: Hip-R MR w contrast: No results  found for this or any previous visit. Hip-L MR w contrast: No results found for this or any previous visit. Hip-R MR w/wo contrast: No results found for this or any previous visit. Hip-L MR w/wo contrast: No results found for this or any previous visit. Hip-R MR wo contrast: No results found for this or any previous visit. Hip-L MR wo contrast: No results found for this or any previous visit. Hip-R CT w contrast: No results found for this or any previous visit. Hip-L CT w contrast:  No results found for this or any previous visit. Hip-R CT w/wo contrast: No results found for this or any previous visit. Hip-L CT w/wo contrast: No results found for this or any previous visit. Hip-R CT wo contrast: No results found for this or any previous visit. Hip-L CT wo contrast: No results found for this or any previous visit. Hip-R DG 2-3 views: No results found for this or any previous visit. Hip-L DG 2-3 views: No results found for this or any previous visit. Hip-R DG Arthrogram: No results found for this or any previous visit. Hip-L DG Arthrogram: No results found for this or any previous visit. Hip-B DG Bilateral: No results found for this or any previous visit.  Knee Imaging: Knee-R MR w contrast: No results found for this or any previous visit. Knee-L MR w contrast: No results found for this or any previous visit. Knee-R MR w/wo contrast: No results found for this or any previous visit. Knee-L MR w/wo contrast: No results found for this or any previous visit. Knee-R MR wo contrast: No results found for this or any previous visit. Knee-L MR wo contrast: No results found for this or any previous visit. Knee-R CT w contrast: No results found for this or any previous visit. Knee-L CT w contrast: No results found for this or any previous visit. Knee-R CT w/wo contrast: No results found for this or any previous visit. Knee-L CT w/wo contrast: No results found for this or any previous visit. Knee-R CT  wo contrast: No results found for this or any previous visit. Knee-L CT wo contrast: No results found for this or any previous visit. Knee-R DG 1-2 views: No results found for this or any previous visit. Knee-L DG 1-2 views: No results found for this or any previous visit. Knee-R DG 3 views: No results found for this or any previous visit. Knee-L DG 3 views: No results found for this or any previous visit. Knee-R DG 4 views: No results found for this or any previous visit. Knee-L DG 4 views: No results found for this or any previous visit. Knee-R DG Arthrogram: No results found for this or any previous visit. Knee-L DG Arthrogram: No results found for this or any previous visit.  Ankle Imaging: Ankle-R DG Complete: No results found for this or any previous visit. Ankle-L DG Complete: No results found for this or any previous visit.  Foot Imaging: Foot-R DG Complete: No results found for this or any previous visit. Foot-L DG Complete: No results found for this or any previous visit.  Elbow Imaging: Elbow-R DG Complete: No results found for this or any previous visit. Elbow-L DG Complete: No results found for this or any previous visit.  Wrist Imaging: Wrist-R DG Complete: No results found for this or any previous visit. Wrist-L DG Complete: No results found for this or any previous visit.  Hand Imaging: Hand-R DG Complete: No results found for this or any previous visit. Hand-L DG Complete: No results found for this or any previous visit.  Complexity Note: Imaging results reviewed. Results shared with Mr. Sweetin, using Layman's terms.                         ROS  Cardiovascular: {Hx; Cardiovascular History:210120525} Pulmonary or Respiratory: {Hx; Pumonary and/or Respiratory History:210120523} Neurological: {Hx; Neurological:210120504} Review of Past Neurological Studies: No results found for this or any previous visit. Psychological-Psychiatric: {Hx; Psychological-Psychiatric  History:210120512} Gastrointestinal: {Hx; Gastrointestinal:210120527} Genitourinary: {Hx; Genitourinary:210120506}  Hematological: {Hx; Hematological:210120510} Endocrine: {Hx; Endocrine history:210120509} Rheumatologic: {Hx; Rheumatological:210120530} Musculoskeletal: {Hx; Musculoskeletal:210120528} Work History: {Hx; Work history:210120514}  Allergies  Mr. Hearne has No Known Allergies.  Laboratory Chemistry   SAFETY SCREENING Profile No results found for: SARSCOV2NAA, COVIDSOURCE, STAPHAUREUS, MRSAPCR, HCVAB, HIV, PREGTESTUR Inflammation Markers (CRP: Acute Phase) (ESR: Chronic Phase) No results found for: CRP, ESRSEDRATE, LATICACIDVEN                       Rheumatology Markers No results found for: RF, ANA, LABURIC, URICUR, LYMEIGGIGMAB, LYMEABIGMQN, HLAB27                      Renal Function Markers Lab Results  Component Value Date   BUN 5 (L) 08/04/2013   CREATININE 1.32 (H) 08/04/2013   GFRAA >60 08/04/2013   GFRNONAA >60 08/04/2013                             Hepatic Function Markers Lab Results  Component Value Date   AST 54 (H) 08/02/2013   ALT 58 08/02/2013   ALBUMIN 4.2 08/02/2013   ALKPHOS 62 08/02/2013   LIPASE 267 08/02/2013                        Electrolytes Lab Results  Component Value Date   NA 138 08/04/2013   K 3.9 08/04/2013   CL 106 08/04/2013   CALCIUM 8.4 (L) 08/04/2013   MG 2.0 02/24/2012                        Neuropathy Markers Lab Results  Component Value Date   HGBA1C 6.4 (H) 02/24/2012                        CNS Tests No results found for: COLORCSF, APPEARCSF, RBCCOUNTCSF, WBCCSF, POLYSCSF, LYMPHSCSF, EOSCSF, PROTEINCSF, GLUCCSF, JCVIRUS, CSFOLI, IGGCSF, LABACHR, ACETBL                      Bone Pathology Markers No results found for: VD25OH, [id number], [id number], [id number], 25OHVITD1, 25OHVITD2, 25OHVITD3, TESTOFREE, TESTOSTERONE                       Coagulation Parameters Lab Results  Component Value Date   INR  1.07 03/19/2016   LABPROT 13.9 03/19/2016   PLT 247 03/19/2016                        Cardiovascular Markers Lab Results  Component Value Date   TROPONINI 0.05 02/23/2012   HGB 13.7 03/19/2016   HCT 42.4 03/19/2016                         ID Test(s) Lab Results  Component Value Date   MICROTEXT  08/02/2013       COMMENT                   NO SALMONELLA OR SHIGELLA ISOLATED   COMMENT                   NO PATHOGENIC E.COLI DETECTED   COMMENT                   NO CAMPYLOBACTER ANTIGEN DETECTED   ANTIBIOTIC  MICROTEXT  08/02/2013       C.DIFFICILE ANTIGEN       C.DIFFICILE GDH ANTIGEN : NEGATIVE   C.DIFFICILE TOXIN A/B     C.DIFFICILE TOXINS A AND B : NEGATIVE   INTERPRETATION            Negative for C. difficile.    ANTIBIOTIC                                                        CA Markers No results found for: CEA, CA125, LABCA2                      Endocrine Markers No results found for: TSH, FREET4, TESTOFREE, TESTOSTERONE, SHBG, ESTRADIOL, ESTRADIOLPCT, ESTRADIOLFRE, LABPREG, ACTH                      Note: Lab results reviewed.  [place]  Drug: Mr. Caul  reports no history of drug use. Alcohol:  reports current alcohol use. Tobacco:  reports that he has been smoking. He has never used smokeless tobacco. Medical:  has a past medical history of Arthritis, Depression, Hypertension, and Sleep apnea. Family: family history is not on file.  Past Surgical History:  Procedure Laterality Date  . COLONOSCOPY    . ESOPHAGOGASTRODUODENOSCOPY (EGD) WITH PROPOFOL N/A 03/19/2016   Procedure: ESOPHAGOGASTRODUODENOSCOPY (EGD) WITH PROPOFOL;  Surgeon: Lollie Sails, MD;  Location: [facility] ENDOSCOPY;  Service: Endoscopy;  Laterality: N/A;   Active Ambulatory Problems    Diagnosis Date Noted  . No Active Ambulatory Problems   Resolved Ambulatory Problems    Diagnosis Date Noted  . No Resolved Ambulatory Problems    Past Medical History:  Diagnosis Date  . Arthritis   . Depression   . Hypertension   . Sleep apnea    Constitutional Exam  General appearance: Well nourished, well developed, and well hydrated. In no apparent acute distress There were no vitals filed for this visit. BMI Assessment: Estimated body mass index is 24.66 kg/m as calculated from the following:   Height as of 03/19/16: 5' 9"  (1.753 m).   Weight as of 03/19/16: 167 lb (75.8 kg).  BMI interpretation table: BMI level Category Range association with higher incidence of chronic pain  <18 kg/m2 Underweight   18.5-24.9 kg/m2 Ideal body weight   25-29.9 kg/m2 Overweight Increased incidence by 20%  30-34.9 kg/m2 Obese (Class I) Increased incidence by 68%  35-39.9 kg/m2 Severe obesity (Class II) Increased incidence by 136%  >40 kg/m2 Extreme obesity (Class III) Increased incidence by 254%   Patient's current BMI Ideal Body weight  There is no height or weight on file to calculate BMI. Patient weight not recorded   BMI Readings from Last 4 Encounters:  03/19/16 24.66 kg/m   Wt Readings from Last 4 Encounters:  03/19/16 167 lb (75.8 kg)  Psych/Mental status: Alert, oriented x 3 (person, place, & time)       Eyes: PERLA Respiratory: No evidence of acute respiratory distress  Cervical Spine Area Exam  Skin & Axial Inspection: No masses, redness, edema, swelling, or associated skin lesions Alignment: Symmetrical Functional ROM: Unrestricted ROM      Stability: No instability detected Muscle Tone/Strength: Functionally intact. No obvious neuro-muscular anomalies detected. Sensory (Neurological): Unimpaired Palpation: No palpable anomalies  Upper Extremity (UE) Exam    Side: Right upper extremity  Side: Left upper extremity  Skin & Extremity Inspection: Skin color, temperature, and hair growth are WNL. No peripheral edema or cyanosis. No masses, redness, swelling, asymmetry, or associated skin lesions. No  contractures.  Skin & Extremity Inspection: Skin color, temperature, and hair growth are WNL. No peripheral edema or cyanosis. No masses, redness, swelling, asymmetry, or associated skin lesions. No contractures.  Functional ROM: Unrestricted ROM          Functional ROM: Unrestricted ROM          Muscle Tone/Strength: Functionally intact. No obvious neuro-muscular anomalies detected.  Muscle Tone/Strength: Functionally intact. No obvious neuro-muscular anomalies detected.  Sensory (Neurological): Unimpaired          Sensory (Neurological): Unimpaired          Palpation: No palpable anomalies              Palpation: No palpable anomalies              Provocative Test(s):  Phalen's test: deferred Tinel's test: deferred Apley's scratch test (touch opposite shoulder):  Action 1 (Across chest): deferred Action 2 (Overhead): deferred Action 3 (LB reach): deferred   Provocative Test(s):  Phalen's test: deferred Tinel's test: deferred Apley's scratch test (touch opposite shoulder):  Action 1 (Across chest): deferred Action 2 (Overhead): deferred Action 3 (LB reach): deferred    Thoracic Spine Area Exam  Skin & Axial Inspection: No masses, redness, or swelling Alignment: Symmetrical Functional ROM: Unrestricted ROM Stability: No instability detected Muscle Tone/Strength: Functionally intact. No obvious neuro-muscular anomalies detected. Sensory (Neurological): Unimpaired Muscle strength & Tone: No palpable anomalies  Lumbar Spine Area Exam  Skin & Axial Inspection: No masses, redness, or swelling Alignment: Symmetrical Functional ROM: Unrestricted ROM       Stability: No instability detected Muscle Tone/Strength: Functionally intact. No obvious neuro-muscular anomalies detected. Sensory (Neurological): Unimpaired Palpation: No palpable anomalies       Provocative Tests: Hyperextension/rotation test: deferred today       Lumbar quadrant test (Kemp's test): deferred today       Lateral  bending test: deferred today       Patrick's Maneuver: deferred today                   FABER* test: deferred today                   S-I anterior distraction/compression test: deferred today         S-I lateral compression test: deferred today         S-I Thigh-thrust test: deferred today         S-I Gaenslen's test: deferred today         *(Flexion, ABduction and External Rotation)  Gait & Posture Assessment  Ambulation: Unassisted Gait: Relatively normal for age and body habitus Posture: WNL   Lower Extremity Exam    Side: Right lower extremity  Side: Left lower extremity  Stability: No instability observed          Stability: No instability observed          Skin & Extremity Inspection: Skin color, temperature, and hair growth are WNL. No peripheral edema or cyanosis. No masses, redness, swelling, asymmetry, or associated skin lesions. No contractures.  Skin & Extremity Inspection: Skin color, temperature, and hair growth are WNL. No peripheral edema or cyanosis. No masses, redness, swelling, asymmetry, or associated skin lesions. No contractures.  Functional ROM: Unrestricted ROM                  Functional ROM: Unrestricted ROM                  Muscle Tone/Strength: Functionally intact. No obvious neuro-muscular anomalies detected.  Muscle Tone/Strength: Functionally intact. No obvious neuro-muscular anomalies detected.  Sensory (Neurological): Unimpaired        Sensory (Neurological): Unimpaired        DTR: Patellar: deferred today Achilles: deferred today Plantar: deferred today  DTR: Patellar: deferred today Achilles: deferred today Plantar: deferred today  Palpation: No palpable anomalies  Palpation: No palpable anomalies   Assessment  Primary Diagnosis & Pertinent Problem List: There were no encounter diagnoses.  Visit Diagnosis (New problems to examiner): No diagnosis found. Plan of Care (Initial workup plan)  Note: Mr. Peatross was reminded that as per protocol,  today's visit has been an evaluation only. We have not taken over the patient's controlled substance management.  Problem-specific plan: No problem-specific Assessment & Plan notes found for this encounter.  Lab Orders  No laboratory test(s) ordered today   Imaging Orders  No imaging studies ordered today   Referral Orders  No referral(s) requested today   Procedure Orders    No procedure(s) ordered today   Pharmacotherapy (current): Medications ordered:  No orders of the defined types were placed in this encounter.  Medications administered during this visit: [name] had no medications administered during this visit.   Pharmacological management options:  Opioid Analgesics: The patient was informed that there is no guarantee that he would be a candidate for opioid analgesics. The decision will be made following CDC guidelines. This decision will be based on the results of diagnostic studies, as well as Mr. Kean risk profile.   Membrane stabilizer: To be determined at a later time  Muscle relaxant: To be determined at a later time  NSAID: To be determined at a later time  Other analgesic(s): To be determined at a later time   Interventional management options: Mr. Tirado was informed that there is no guarantee that he would be a candidate for interventional therapies. The decision will be based on the results of diagnostic studies, as well as Mr. Crissman risk profile.  Procedure(s) under consideration:  ***   Provider-requested follow-up: No follow-ups on file.  Future Appointments  Date Time Provider [place]  11/30/2018  1:15 PM Gillis Santa, MD [facility] None    Primary Care Physician: Patient, No Pcp Per Location: [facility] Note by: Gillis Santa, MD Date: 11/30/2018; Time: 3:45 PM  Note: This dictation was prepared with Dragon dictation. Any transcriptional errors that may result from this process are  unintentional.

## 2018-11-30 ENCOUNTER — Ambulatory Visit: Payer: Medicaid Other | Admitting: Student in an Organized Health Care Education/Training Program

## 2019-05-28 ENCOUNTER — Emergency Department: Payer: Medicaid Other

## 2019-05-28 ENCOUNTER — Emergency Department
Admission: EM | Admit: 2019-05-28 | Discharge: 2019-05-28 | Disposition: A | Discharge status: Home / Self Care | Payer: Medicaid Other | Attending: Emergency Medicine | Admitting: Emergency Medicine

## 2019-05-28 ENCOUNTER — Other Ambulatory Visit: Payer: Self-pay

## 2019-05-28 ENCOUNTER — Encounter: Payer: Self-pay | Admitting: Emergency Medicine

## 2019-05-28 DIAGNOSIS — Z79899 Other long term (current) drug therapy: Secondary | ICD-10-CM | POA: Diagnosis not present

## 2019-05-28 DIAGNOSIS — F17200 Nicotine dependence, unspecified, uncomplicated: Secondary | ICD-10-CM | POA: Insufficient documentation

## 2019-05-28 DIAGNOSIS — I10 Essential (primary) hypertension: Secondary | ICD-10-CM | POA: Diagnosis not present

## 2019-05-28 DIAGNOSIS — K862 Cyst of pancreas: Secondary | ICD-10-CM | POA: Insufficient documentation

## 2019-05-28 DIAGNOSIS — R1033 Periumbilical pain: Secondary | ICD-10-CM | POA: Insufficient documentation

## 2019-05-28 DIAGNOSIS — R109 Unspecified abdominal pain: Secondary | ICD-10-CM

## 2019-05-28 HISTORY — DX: Gastrointestinal hemorrhage, unspecified: K92.2

## 2019-05-28 LAB — URINALYSIS, COMPLETE (UACMP) WITH MICROSCOPIC
Bacteria, UA: NONE SEEN
Bilirubin Urine: NEGATIVE
Glucose, UA: 150 mg/dL — AB
Hgb urine dipstick: NEGATIVE
Ketones, ur: NEGATIVE mg/dL
Leukocytes,Ua: NEGATIVE
Nitrite: NEGATIVE
Protein, ur: 30 mg/dL — AB
Specific Gravity, Urine: 1.027 (ref 1.005–1.030)
pH: 5 (ref 5.0–8.0)

## 2019-05-28 LAB — CBC
HCT: 38.6 % — ABNORMAL LOW (ref 39.0–52.0)
Hemoglobin: 12.6 g/dL — ABNORMAL LOW (ref 13.0–17.0)
MCH: 26.4 pg (ref 26.0–34.0)
MCHC: 32.6 g/dL (ref 30.0–36.0)
MCV: 80.9 fL (ref 80.0–100.0)
Platelets: 297 10*3/uL (ref 150–400)
RBC: 4.77 MIL/uL (ref 4.22–5.81)
RDW: 13.4 % (ref 11.5–15.5)
WBC: 7.6 10*3/uL (ref 4.0–10.5)
nRBC: 0 % (ref 0.0–0.2)

## 2019-05-28 LAB — COMPREHENSIVE METABOLIC PANEL
ALT: 25 U/L (ref 0–44)
AST: 28 U/L (ref 15–41)
Albumin: 3.9 g/dL (ref 3.5–5.0)
Alkaline Phosphatase: 53 U/L (ref 38–126)
Anion gap: 11 (ref 5–15)
BUN: 7 mg/dL (ref 6–20)
CO2: 29 mmol/L (ref 22–32)
Calcium: 9.4 mg/dL (ref 8.9–10.3)
Chloride: 93 mmol/L — ABNORMAL LOW (ref 98–111)
Creatinine, Ser: 0.98 mg/dL (ref 0.61–1.24)
GFR calc Af Amer: >60 mL/min (ref >60)
GFR calc non Af Amer: >60 mL/min (ref >60)
Glucose, Bld: 187 mg/dL — ABNORMAL HIGH (ref 70–99)
Potassium: 3.2 mmol/L — ABNORMAL LOW (ref 3.5–5.1)
Sodium: 133 mmol/L — ABNORMAL LOW (ref 135–145)
Total Bilirubin: 1.3 mg/dL — ABNORMAL HIGH (ref 0.3–1.2)
Total Protein: 7.9 g/dL (ref 6.5–8.1)

## 2019-05-28 LAB — LIPASE, BLOOD: Lipase: 55 U/L — ABNORMAL HIGH (ref 11–51)

## 2019-05-28 MED ORDER — ONDANSETRON HCL 4 MG/2ML IJ SOLN: 4.0000 mg | Freq: Once | INTRAMUSCULAR | Status: DC

## 2019-05-28 MED ORDER — SODIUM CHLORIDE 0.9 % IV BOLUS
500.0000 mL | Freq: Once | INTRAVENOUS | Status: AC
  Administered 2019-05-28: 12:00:00 500 mL via INTRAVENOUS

## 2019-05-28 MED ORDER — MORPHINE SULFATE (PF) 4 MG/ML IV SOLN
4.0000 mg | Freq: Once | INTRAVENOUS | Status: AC
  Administered 2019-05-28: 12:00:00 4 mg via INTRAVENOUS
  Filled 2019-05-28: qty 1

## 2019-05-28 MED ORDER — HYDROCODONE-ACETAMINOPHEN 5-325 MG PO TABS: 1.0000 | ORAL_TABLET | ORAL | 0 refills | Status: AC | PRN

## 2019-05-28 MED ORDER — FAMOTIDINE 20 MG PO TABS: 20.0000 mg | ORAL_TABLET | Freq: Two times a day (BID) | ORAL | 0 refills | Status: DC

## 2019-05-28 MED ORDER — IOHEXOL 300 MG/ML  SOLN
100.0000 mL | Freq: Once | INTRAMUSCULAR | Status: AC | PRN
  Administered 2019-05-28: 100 mL via INTRAVENOUS

## 2019-05-28 MED ORDER — SODIUM CHLORIDE 0.9% FLUSH
3.0000 mL | Freq: Once | INTRAVENOUS | Status: AC
  Administered 2019-05-28: 12:00:00 3 mL via INTRAVENOUS

## 2019-05-28 MED ORDER — IOHEXOL 9 MG/ML PO SOLN: 500.0000 mL | ORAL | Status: AC

## 2019-05-28 NOTE — ED Triage Notes (Signed)
Says mid abd pain like tying in knots for 3 days.  No vomiting or diarrhea.  No fever

## 2019-05-28 NOTE — ED Provider Notes (Signed)
[facility] Emergency Department Provider Note ____________________________________________   First MD Initiated Contact with Patient 05/28/19 1108     (approximate)  I have reviewed the triage vital signs and the nursing notes.   HISTORY  Chief Complaint Abdominal Pain    HPI Louis Gardner is a 59 y.o. male with PMH as noted below who presents with periumbilical and lower abdominal pain, described as a feeling of tying in knots, persistent over approximately the last 5 days although intermittent, and associated with decreased bowel movements.  The patient is still passing gas.  He has had no nausea or vomiting.  He denies any prior history of this pain.  The patient states that he has been drinking a bit more heavily in the last few weeks after the death of a family member, but does not drink every day.  Past Medical History:  Diagnosis Date  . Arthritis   . Depression   . GI bleeding   . Hypertension   . Sleep apnea     There are no problems to display for this patient.   Past Surgical History:  Procedure Laterality Date  . COLONOSCOPY    . ESOPHAGOGASTRODUODENOSCOPY (EGD) WITH PROPOFOL N/A 03/19/2016   Procedure: ESOPHAGOGASTRODUODENOSCOPY (EGD) WITH PROPOFOL;  Surgeon: Lollie Sails, MD;  Location: [facility] ENDOSCOPY;  Service: Endoscopy;  Laterality: N/A;    Prior to Admission medications   Medication Sig Start Date End Date Taking? Authorizing Provider  buPROPion (WELLBUTRIN SR) 150 MG 12 hr tablet Take 150 mg by mouth 2 (two) times daily.    [provider]  famotidine (PEPCID) 20 MG tablet Take 1 tablet (20 mg total) by mouth 2 (two) times daily for 15 days. 05/28/19 06/12/19  Arta Silence, MD  hydrochlorothiazide (HYDRODIURIL) 12.5 MG tablet Take 12.5 mg by mouth daily.    [provider]  HYDROcodone-acetaminophen (NORCO/VICODIN) 5-325 MG tablet Take 1 tablet by mouth every 4 (four) hours as needed for up to 5  days for moderate pain. 05/28/19 06/02/19  Arta Silence, MD  pantoprazole (PROTONIX) 40 MG tablet Take 40 mg by mouth daily.    [provider]    Allergies Patient has no known allergies.  No family history on file.  Social History Social History   Tobacco Use  . Smoking status: Current Every Day Smoker  . Smokeless tobacco: Never Used  Substance Use Topics  . Alcohol use: Yes  . Drug use: No    Review of Systems  Constitutional: No fever. Eyes: No redness. ENT: No sore throat. Cardiovascular: Denies chest pain. Respiratory: Denies shortness of breath. Gastrointestinal: No vomiting. Genitourinary: Negative for dysuria.  Musculoskeletal: Negative for back pain. Skin: Negative for rash. Neurological: Negative for headache.   ____________________________________________   PHYSICAL EXAM:  VITAL SIGNS: ED Triage Vitals  Enc Vitals Group     BP 05/28/19 0853 137/89     Pulse Rate 05/28/19 0853 87     Resp 05/28/19 0853 16     Temp 05/28/19 0853 99 F (37.2 C)     Temp Source 05/28/19 0853 Oral     SpO2 05/28/19 0853 99 %     Weight 05/28/19 0854 159 lb (72.1 kg)     Height 05/28/19 0854 5\' 9"  (1.753 m)     Head Circumference --      Peak Flow --      Pain Score 05/28/19 0854 9     Pain Loc --  Pain Edu? --      Excl. in [place]? --     Constitutional: Alert and oriented.  Relatively well appearing and in no acute distress. Eyes: Conjunctivae are normal.  No scleral icterus. Head: Atraumatic. Nose: No congestion/rhinnorhea. Mouth/Throat: Mucous membranes are moist.   Neck: Normal range of motion.  Cardiovascular: Normal rate, regular rhythm. Good peripheral circulation. Respiratory: Normal respiratory effort.  No retractions.  Gastrointestinal: Soft with mild diffuse tenderness, worst in bilateral lower quadrants.  Umbilical hernia which is nontender and easily reducible.  No distention.  Genitourinary: No flank tenderness. Musculoskeletal:  Extremities warm and well perfused.  Neurologic:  Normal speech and language. No gross focal neurologic deficits are appreciated.  Skin:  Skin is warm and dry. No rash noted. Psychiatric: Mood and affect are normal. Speech and behavior are normal.  ____________________________________________   LABS (all labs ordered are listed, but only abnormal results are displayed)  Labs Reviewed  LIPASE, BLOOD - Abnormal; Notable for the following components:      Result Value   Lipase 55 (*)    All other components within normal limits  COMPREHENSIVE METABOLIC PANEL - Abnormal; Notable for the following components:   Sodium 133 (*)    Potassium 3.2 (*)    Chloride 93 (*)    Glucose, Bld 187 (*)    Total Bilirubin 1.3 (*)    All other components within normal limits  CBC - Abnormal; Notable for the following components:   Hemoglobin 12.6 (*)    HCT 38.6 (*)    All other components within normal limits  URINALYSIS, COMPLETE (UACMP) WITH MICROSCOPIC - Abnormal; Notable for the following components:   Color, Urine AMBER (*)    APPearance HAZY (*)    Glucose, UA 150 (*)    Protein, ur 30 (*)    All other components within normal limits   ____________________________________________  EKG   ____________________________________________  RADIOLOGY  CT abdomen: Cystic pancreatic mass  ____________________________________________   PROCEDURES  Procedure(s) performed: No  Procedures  Critical Care performed: No ____________________________________________   INITIAL IMPRESSION / ASSESSMENT AND PLAN / ED COURSE  Pertinent labs & imaging results that were available during my care of the patient were reviewed by me and considered in my medical decision making (see chart for details).  59 year old male with PMH as noted above as well as the laparotomy when he was a child presents with crampy periumbilical and lower abdominal pain over the last 5 days with no vomiting or fever.  On  exam he is relatively well-appearing and his vital signs are normal.  The abdomen is soft with mild diffuse tenderness worst in the lower quadrants.  Differential is broad but includes diverticulitis, colitis, SBO, volvulus, pancreatitis, other hepatobiliary etiology, or gastritis.  Initial lab work-up is relatively unrevealing.  We will obtain a CT and reassess.  ----------------------------------------- 2:48 PM on 05/28/2019 -----------------------------------------  CT shows a cystic pancreatic mass.  This could be a neoplasm, however given the patient's history of drinking this also could be a chronic pseudocyst.  The patient is feeling much better and is tolerating p.o.  I discussed the patient's case with Dr. Alice Reichert from GI over the phone.  Given that the patient has no concerning lab abnormalities, the pain is well controlled, and he is tolerating p.o., Dr. Alice Reichert advises that he is appropriate for outpatient work-up including an MRCP.  The patient has a colonoscopy scheduled for February 2 with his GI doctor at [facility].  I  have instructed him to call his GI on Monday to let them know that he will need an MRCP as well.  The patient understands and agrees with this plan.  I have prescribed Pepcid as well as Norco for symptomatic treatment.  I gave the patient thorough return precautions and he expresses understanding. ____________________________________________   FINAL CLINICAL IMPRESSION(S) / ED DIAGNOSES  Final diagnoses:  Abdominal pain, unspecified abdominal location  Pancreatic cyst      NEW MEDICATIONS STARTED DURING THIS VISIT:  New Prescriptions   FAMOTIDINE (PEPCID) 20 MG TABLET    Take 1 tablet (20 mg total) by mouth 2 (two) times daily for 15 days.   HYDROCODONE-ACETAMINOPHEN (NORCO/VICODIN) 5-325 MG TABLET    Take 1 tablet by mouth every 4 (four) hours as needed for up to 5 days for moderate pain.     Note:  This document was prepared using Dragon voice  recognition software and may include unintentional dictation errors.    Arta Silence, MD 05/28/19 1450

## 2019-05-28 NOTE — ED Notes (Deleted)
Pt drinking oral contrast provided by CT.

## 2019-05-28 NOTE — Discharge Instructions (Signed)
Take the Pepcid as prescribed twice daily.  You can take the Norco as needed for more severe pain.  Your CT scan shows a cyst on your pancreas.  You will need a test called an MRCP to further evaluate this cyst to see what it is.  You should call your gastroenterologist on Monday so that you can arrange for this test to be done within the next few weeks.  In the meantime, you should return to the ER for any new or worsening pain, vomiting, fever, jaundice, or any other new or worsening symptoms that concern you.

## 2019-10-29 ENCOUNTER — Other Ambulatory Visit: Payer: Self-pay | Admitting: Family Medicine

## 2019-10-29 DIAGNOSIS — R1030 Lower abdominal pain, unspecified: Secondary | ICD-10-CM

## 2019-11-02 ENCOUNTER — Ambulatory Visit: Admission: RE | Admit: 2019-11-02 | Payer: Medicaid Other | Source: Ambulatory Visit

## 2019-11-24 ENCOUNTER — Other Ambulatory Visit: Payer: Self-pay | Admitting: Physician Assistant

## 2019-11-24 ENCOUNTER — Emergency Department
Admission: EM | Admit: 2019-11-24 | Discharge: 2019-11-24 | Disposition: A | Discharge status: Home / Self Care | Payer: Medicaid Other | Attending: Emergency Medicine | Admitting: Emergency Medicine

## 2019-11-24 ENCOUNTER — Other Ambulatory Visit: Payer: Self-pay

## 2019-11-24 DIAGNOSIS — Z5321 Procedure and treatment not carried out due to patient leaving prior to being seen by health care provider: Secondary | ICD-10-CM | POA: Insufficient documentation

## 2019-11-24 DIAGNOSIS — R1030 Lower abdominal pain, unspecified: Secondary | ICD-10-CM | POA: Insufficient documentation

## 2019-11-24 LAB — COMPREHENSIVE METABOLIC PANEL
ALT: 14 U/L (ref 0–44)
AST: 19 U/L (ref 15–41)
Albumin: 3.9 g/dL (ref 3.5–5.0)
Alkaline Phosphatase: 62 U/L (ref 38–126)
Anion gap: 17 — ABNORMAL HIGH (ref 5–15)
BUN: 12 mg/dL (ref 6–20)
CO2: 25 mmol/L (ref 22–32)
Calcium: 9.5 mg/dL (ref 8.9–10.3)
Chloride: 89 mmol/L — ABNORMAL LOW (ref 98–111)
Creatinine, Ser: 1.32 mg/dL — ABNORMAL HIGH (ref 0.61–1.24)
GFR calc Af Amer: >60 mL/min (ref >60)
GFR calc non Af Amer: 58 mL/min — ABNORMAL LOW (ref >60)
Glucose, Bld: 161 mg/dL — ABNORMAL HIGH (ref 70–99)
Potassium: 4.2 mmol/L (ref 3.5–5.1)
Sodium: 131 mmol/L — ABNORMAL LOW (ref 135–145)
Total Bilirubin: 2.1 mg/dL — ABNORMAL HIGH (ref 0.3–1.2)
Total Protein: 9.2 g/dL — ABNORMAL HIGH (ref 6.5–8.1)

## 2019-11-24 LAB — URINALYSIS, COMPLETE (UACMP) WITH MICROSCOPIC
Bacteria, UA: NONE SEEN
Glucose, UA: NEGATIVE mg/dL
Hgb urine dipstick: NEGATIVE
Ketones, ur: 20 mg/dL — AB
Nitrite: NEGATIVE
Protein, ur: 100 mg/dL — AB
Specific Gravity, Urine: 1.026 (ref 1.005–1.030)
pH: 5 (ref 5.0–8.0)

## 2019-11-24 LAB — CBC
HCT: 40.3 % (ref 39.0–52.0)
Hemoglobin: 13.2 g/dL (ref 13.0–17.0)
MCH: 26.2 pg (ref 26.0–34.0)
MCHC: 32.8 g/dL (ref 30.0–36.0)
MCV: 80.1 fL (ref 80.0–100.0)
Platelets: 463 10*3/uL — ABNORMAL HIGH (ref 150–400)
RBC: 5.03 MIL/uL (ref 4.22–5.81)
RDW: 14.2 % (ref 11.5–15.5)
WBC: 12.2 10*3/uL — ABNORMAL HIGH (ref 4.0–10.5)
nRBC: 0 % (ref 0.0–0.2)

## 2019-11-24 LAB — LIPASE, BLOOD: Lipase: 235 U/L — ABNORMAL HIGH (ref 11–51)

## 2019-11-24 MED ORDER — SODIUM CHLORIDE 0.9% FLUSH: 3.0000 mL | Freq: Once | INTRAVENOUS | Status: DC

## 2019-11-24 NOTE — ED Triage Notes (Signed)
Pt c/o lower abd pain for the past couple days with n/v. Pt is in NAD.

## 2019-11-25 ENCOUNTER — Telehealth: Payer: Self-pay | Admitting: Emergency Medicine

## 2019-11-25 DIAGNOSIS — K8689 Other specified diseases of pancreas: Principal | ICD-10-CM

## 2019-11-25 NOTE — Telephone Encounter (Signed)
Called patient due to lwot to inquire about condition and follow up plans.  No answer and no voicemail  

## 2019-12-08 ENCOUNTER — Encounter
Admit: 2019-12-08 | Discharge: 2019-12-08 | Payer: PRIVATE HEALTH INSURANCE | Attending: Critical Care Medicine | Primary: Critical Care Medicine

## 2019-12-08 ENCOUNTER — Non-Acute Institutional Stay: Admit: 2019-12-08 | Discharge: 2019-12-08 | Payer: PRIVATE HEALTH INSURANCE

## 2019-12-08 ENCOUNTER — Ambulatory Visit: Admit: 2019-12-08 | Discharge: 2019-12-08 | Payer: MEDICAID

## 2019-12-30 ENCOUNTER — Ambulatory Visit: Admit: 2019-12-30 | Payer: PRIVATE HEALTH INSURANCE

## 2019-12-30 ENCOUNTER — Ambulatory Visit: Payer: Medicaid Other | Admitting: Cardiology

## 2020-01-26 ENCOUNTER — Telehealth
Admit: 2020-01-26 | Discharge: 2020-01-27 | Payer: PRIVATE HEALTH INSURANCE | Attending: Student in an Organized Health Care Education/Training Program | Primary: Student in an Organized Health Care Education/Training Program

## 2020-01-31 ENCOUNTER — Ambulatory Visit: Payer: Medicaid Other | Admitting: Cardiology

## 2020-02-02 ENCOUNTER — Telehealth: Admit: 2020-02-02 | Discharge: 2020-02-02 | Payer: PRIVATE HEALTH INSURANCE

## 2020-02-02 DIAGNOSIS — K8689 Other specified diseases of pancreas: Principal | ICD-10-CM

## 2020-02-02 DIAGNOSIS — R109 Unspecified abdominal pain: Principal | ICD-10-CM

## 2020-02-04 ENCOUNTER — Ambulatory Visit: Admit: 2020-02-04 | Discharge: 2020-02-05 | Payer: PRIVATE HEALTH INSURANCE

## 2020-02-04 DIAGNOSIS — K8689 Other specified diseases of pancreas: Principal | ICD-10-CM

## 2020-02-07 ENCOUNTER — Ambulatory Visit: Payer: Medicaid Other | Admitting: Cardiology

## 2020-02-22 ENCOUNTER — Encounter: Admit: 2020-02-22 | Payer: PRIVATE HEALTH INSURANCE

## 2021-02-09 DIAGNOSIS — K8689 Other specified diseases of pancreas: Principal | ICD-10-CM

## 2021-02-15 DIAGNOSIS — K8689 Other specified diseases of pancreas: Principal | ICD-10-CM

## 2021-02-15 DIAGNOSIS — Z8719 Personal history of other diseases of the digestive system: Principal | ICD-10-CM

## 2021-02-15 DIAGNOSIS — K861 Other chronic pancreatitis: Principal | ICD-10-CM

## 2021-02-15 DIAGNOSIS — F1010 Alcohol abuse, uncomplicated: Principal | ICD-10-CM

## 2021-03-19 ENCOUNTER — Ambulatory Visit: Admit: 2021-03-19 | Discharge: 2021-03-20 | Payer: PRIVATE HEALTH INSURANCE

## 2021-04-06 DIAGNOSIS — R590 Localized enlarged lymph nodes: Principal | ICD-10-CM

## 2021-04-17 ENCOUNTER — Other Ambulatory Visit: Admit: 2021-04-17 | Discharge: 2021-04-17 | Payer: PRIVATE HEALTH INSURANCE

## 2021-04-17 ENCOUNTER — Ambulatory Visit: Admit: 2021-04-17 | Discharge: 2021-04-17 | Payer: PRIVATE HEALTH INSURANCE

## 2021-04-17 DIAGNOSIS — N400 Enlarged prostate without lower urinary tract symptoms: Principal | ICD-10-CM

## 2021-04-17 DIAGNOSIS — R591 Generalized enlarged lymph nodes: Principal | ICD-10-CM

## 2021-04-26 ENCOUNTER — Ambulatory Visit: Admit: 2021-04-26 | Discharge: 2021-04-27 | Payer: PRIVATE HEALTH INSURANCE

## 2021-04-26 DIAGNOSIS — K861 Other chronic pancreatitis: Principal | ICD-10-CM

## 2021-04-26 DIAGNOSIS — Z8719 Personal history of other diseases of the digestive system: Principal | ICD-10-CM

## 2021-04-26 DIAGNOSIS — F1010 Alcohol abuse, uncomplicated: Principal | ICD-10-CM

## 2021-04-26 DIAGNOSIS — K8689 Other specified diseases of pancreas: Principal | ICD-10-CM

## 2021-05-29 ENCOUNTER — Ambulatory Visit: Admit: 2021-05-29 | Discharge: 2021-05-29 | Payer: PRIVATE HEALTH INSURANCE

## 2021-05-29 DIAGNOSIS — R591 Generalized enlarged lymph nodes: Principal | ICD-10-CM

## 2021-05-29 DIAGNOSIS — N400 Enlarged prostate without lower urinary tract symptoms: Principal | ICD-10-CM

## 2021-05-30 DIAGNOSIS — N400 Enlarged prostate without lower urinary tract symptoms: Principal | ICD-10-CM

## 2021-05-31 DIAGNOSIS — C61 Malignant neoplasm of prostate: Principal | ICD-10-CM

## 2021-05-31 DIAGNOSIS — C772 Secondary and unspecified malignant neoplasm of intra-abdominal lymph nodes: Principal | ICD-10-CM

## 2021-06-01 DIAGNOSIS — C61 Malignant neoplasm of prostate: Principal | ICD-10-CM

## 2021-06-04 ENCOUNTER — Ambulatory Visit: Admit: 2021-06-04 | Discharge: 2021-06-04 | Payer: PRIVATE HEALTH INSURANCE

## 2021-06-04 DIAGNOSIS — C61 Malignant neoplasm of prostate: Principal | ICD-10-CM

## 2021-06-04 DIAGNOSIS — C775 Secondary and unspecified malignant neoplasm of intrapelvic lymph nodes: Principal | ICD-10-CM

## 2021-06-04 DIAGNOSIS — M818 Other osteoporosis without current pathological fracture: Principal | ICD-10-CM

## 2021-06-04 DIAGNOSIS — C772 Secondary and unspecified malignant neoplasm of intra-abdominal lymph nodes: Principal | ICD-10-CM

## 2021-06-04 DIAGNOSIS — T50905A Adverse effect of unspecified drugs, medicaments and biological substances, initial encounter: Principal | ICD-10-CM

## 2021-06-04 HISTORY — DX: Secondary and unspecified malignant neoplasm of intrapelvic lymph nodes: C61

## 2021-06-04 MED ORDER — CALCIUM CARBONATE 600 MG-VITAMIN D3 20 MCG (800 UNIT) TABLET
ORAL_TABLET | Freq: Two times a day (BID) | ORAL | 11 refills | 0 days | Status: CP
Start: 2021-06-04 — End: ?

## 2021-06-05 DIAGNOSIS — C775 Secondary and unspecified malignant neoplasm of intrapelvic lymph nodes: Principal | ICD-10-CM

## 2021-06-05 DIAGNOSIS — C61 Malignant neoplasm of prostate: Principal | ICD-10-CM

## 2021-06-06 DIAGNOSIS — C61 Malignant neoplasm of prostate: Principal | ICD-10-CM

## 2021-06-06 DIAGNOSIS — C772 Secondary and unspecified malignant neoplasm of intra-abdominal lymph nodes: Principal | ICD-10-CM

## 2021-06-08 ENCOUNTER — Emergency Department
Admission: EM | Admit: 2021-06-08 | Discharge: 2021-06-11 | Disposition: A | Discharge status: Home / Self Care | Payer: Medicaid Other | Attending: Emergency Medicine | Admitting: Emergency Medicine

## 2021-06-08 ENCOUNTER — Emergency Department: Payer: Medicaid Other

## 2021-06-08 ENCOUNTER — Encounter: Payer: Self-pay | Admitting: Emergency Medicine

## 2021-06-08 ENCOUNTER — Other Ambulatory Visit: Payer: Self-pay

## 2021-06-08 DIAGNOSIS — C61 Malignant neoplasm of prostate: Secondary | ICD-10-CM | POA: Insufficient documentation

## 2021-06-08 DIAGNOSIS — R140 Abdominal distension (gaseous): Secondary | ICD-10-CM | POA: Diagnosis not present

## 2021-06-08 DIAGNOSIS — R339 Retention of urine, unspecified: Secondary | ICD-10-CM | POA: Insufficient documentation

## 2021-06-08 DIAGNOSIS — C775 Secondary and unspecified malignant neoplasm of intrapelvic lymph nodes: Secondary | ICD-10-CM | POA: Diagnosis not present

## 2021-06-08 DIAGNOSIS — I10 Essential (primary) hypertension: Secondary | ICD-10-CM | POA: Diagnosis not present

## 2021-06-08 DIAGNOSIS — M549 Dorsalgia, unspecified: Secondary | ICD-10-CM | POA: Insufficient documentation

## 2021-06-08 LAB — CBC WITH DIFFERENTIAL/PLATELET
Abs Immature Granulocytes: 0.04 10*3/uL (ref 0.00–0.07)
Basophils Absolute: 0 10*3/uL (ref 0.0–0.1)
Basophils Relative: 1 %
Eosinophils Absolute: 0 10*3/uL (ref 0.0–0.5)
Eosinophils Relative: 1 %
HCT: 39.5 % (ref 39.0–52.0)
Hemoglobin: 13.1 g/dL (ref 13.0–17.0)
Immature Granulocytes: 1 %
Lymphocytes Relative: 23 %
Lymphs Abs: 1.8 10*3/uL (ref 0.7–4.0)
MCH: 26 pg (ref 26.0–34.0)
MCHC: 33.2 g/dL (ref 30.0–36.0)
MCV: 78.5 fL — ABNORMAL LOW (ref 80.0–100.0)
Monocytes Absolute: 1.1 10*3/uL — ABNORMAL HIGH (ref 0.1–1.0)
Monocytes Relative: 15 %
Neutro Abs: 4.7 10*3/uL (ref 1.7–7.7)
Neutrophils Relative %: 59 %
Platelets: 564 10*3/uL — ABNORMAL HIGH (ref 150–400)
RBC: 5.03 MIL/uL (ref 4.22–5.81)
RDW: 12.9 % (ref 11.5–15.5)
WBC: 7.7 10*3/uL (ref 4.0–10.5)
nRBC: 0 % (ref 0.0–0.2)

## 2021-06-08 LAB — COMPREHENSIVE METABOLIC PANEL WITH GFR
ALT: 13 U/L (ref 0–44)
AST: 34 U/L (ref 15–41)
Albumin: 3.7 g/dL (ref 3.5–5.0)
Alkaline Phosphatase: 121 U/L (ref 38–126)
Anion gap: 17 — ABNORMAL HIGH (ref 5–15)
BUN: 8 mg/dL (ref 8–23)
CO2: 24 mmol/L (ref 22–32)
Calcium: 10.1 mg/dL (ref 8.9–10.3)
Chloride: 87 mmol/L — ABNORMAL LOW (ref 98–111)
Creatinine, Ser: 1.24 mg/dL (ref 0.61–1.24)
GFR, Estimated: >60 mL/min
Glucose, Bld: 145 mg/dL — ABNORMAL HIGH (ref 70–99)
Potassium: 3.8 mmol/L (ref 3.5–5.1)
Sodium: 128 mmol/L — ABNORMAL LOW (ref 135–145)
Total Bilirubin: 1.1 mg/dL (ref 0.3–1.2)
Total Protein: 9.1 g/dL — ABNORMAL HIGH (ref 6.5–8.1)

## 2021-06-08 LAB — URINALYSIS, COMPLETE (UACMP) WITH MICROSCOPIC
Bacteria, UA: NONE SEEN
Bilirubin Urine: NEGATIVE
Glucose, UA: NEGATIVE mg/dL
Nitrite: NEGATIVE
Protein, ur: NEGATIVE mg/dL
Specific Gravity, Urine: 1.02 (ref 1.005–1.030)
pH: 7.5 (ref 5.0–8.0)

## 2021-06-08 MED ORDER — TAMSULOSIN HCL 0.4 MG PO CAPS: 0.4000 mg | ORAL_CAPSULE | Freq: Every day | ORAL | 0 refills | Status: AC

## 2021-06-08 MED ORDER — SULFAMETHOXAZOLE-TRIMETHOPRIM 800-160 MG PO TABS: 1.0000 | ORAL_TABLET | Freq: Two times a day (BID) | ORAL | 0 refills | Status: AC

## 2021-06-08 MED ORDER — SULFAMETHOXAZOLE-TRIMETHOPRIM 800-160 MG PO TABS
1.0000 | ORAL_TABLET | Freq: Once | ORAL | Status: AC
  Administered 2021-06-08: 1 via ORAL
  Filled 2021-06-08: qty 1

## 2021-06-08 MED ORDER — TAMSULOSIN HCL 0.4 MG PO CAPS
0.4000 mg | ORAL_CAPSULE | Freq: Once | ORAL | Status: AC
  Administered 2021-06-08: 0.4 mg via ORAL
  Filled 2021-06-08: qty 1

## 2021-06-08 NOTE — ED Notes (Addendum)
Pt with distended bladder noted and c/o urge to void; 32fr coude cath inserted without difficulty, 862ml clear amber urine returned; pt tolerated well and st complete relief of pain

## 2021-06-08 NOTE — ED Notes (Signed)
Attempt to place foley catheter unsuccessful. Unable to advance through prostate at this time.

## 2021-06-08 NOTE — ED Notes (Signed)
473ml urine emptied from cath bag; large bag changed to leg beg; pt & SO voices good understanding of maintenance and care

## 2021-06-08 NOTE — ED Triage Notes (Signed)
Patient ambulatory to triage with steady gait, without difficulty or distress noted; pt reports back and "pelvis" pain with diff urinating and constipation x 2 days; denies hx of same

## 2021-06-08 NOTE — Discharge Instructions (Signed)

## 2021-06-08 NOTE — ED Provider Notes (Signed)
[facility] Provider Note    Event Date/Time   First MD Initiated Contact with Patient 06/08/21 [phone]     (approximate)   History   Back Pain   HPI  Louis Gardner is a 61 y.o. 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  Patient complaining of difficulty urinating for 2 days and not being able to urinate for the last 6 hours.  Severe abdominal distention and pain radiating straight to his back.  Also has not had a bowel movement in 2 days.  Denies a history of urinary retention or constipation.  No fever or chills.  No dysuria.  Has never had a Foley catheter before     Past Medical History:  Diagnosis Date   Arthritis    Depression    GI bleeding    Hypertension    Sleep apnea     Past Surgical History:  Procedure Laterality Date   COLONOSCOPY     ESOPHAGOGASTRODUODENOSCOPY (EGD) WITH PROPOFOL N/A 03/19/2016   Procedure: ESOPHAGOGASTRODUODENOSCOPY (EGD) WITH PROPOFOL;  Surgeon: Lollie Sails, MD;  Location: [facility] ENDOSCOPY;  Service: Endoscopy;  Laterality: N/A;     Physical Exam   Triage Vital Signs: ED Triage Vitals  Enc Vitals Group     BP 06/08/21 0407 (!) 147/99     Pulse Rate 06/08/21 0407 93     Resp 06/08/21 0407 18     Temp 06/08/21 0407 98.5 F (36.9 C)     Temp Source 06/08/21 0407 Oral     SpO2 06/08/21 0407 99 %     Weight 06/08/21 0407 159 lb (72.1 kg)     Height 06/08/21 0407 5\' 9"  (1.753 m)     Head Circumference --      Peak Flow --      Pain Score 06/08/21 0406 10     Pain Loc --      Pain Edu? --      Excl. in [place]? --     Most recent vital signs: Vitals:   06/08/21 0407  BP: (!) 147/99  Pulse: 93  Resp: 18  Temp: 98.5 F (36.9 C)  SpO2: 99%     Constitutional: Alert and oriented. Well appearing and in no apparent distress. HEENT:      Head: Normocephalic and atraumatic.          Eyes: Conjunctivae are normal. Sclera is non-icteric.       Mouth/Throat: Mucous membranes are moist.       Neck: Supple with no signs of meningismus. Cardiovascular: Regular rate and rhythm. No murmurs, gallops, or rubs. 2+ symmetrical distal pulses are present in all extremities.  Respiratory: Normal respiratory effort. Lungs are clear to auscultation bilaterally.  Gastrointestinal: Soft, distended bladder with positive bowel sounds. No rebound or guarding. Genitourinary: No CVA tenderness. Musculoskeletal:  No edema, cyanosis, or erythema of extremities. Neurologic: Normal speech and language. Face is symmetric. Moving all extremities. No gross focal neurologic deficits are appreciated. Skin: Skin is warm, dry and intact. No rash noted. Psychiatric: Mood and affect are normal. Speech and behavior are normal.  ED Results / Procedures / Treatments   Labs (all labs ordered are listed, but only abnormal results are displayed) Labs Reviewed  CBC WITH DIFFERENTIAL/PLATELET - Abnormal; Notable for the following components:      Result Value   MCV 78.5 (*)    Platelets 564 (*)  Monocytes Absolute 1.1 (*)    All other components within normal limits  COMPREHENSIVE METABOLIC PANEL - Abnormal; Notable for the following components:   Sodium 128 (*)    Chloride 87 (*)    Glucose, Bld 145 (*)    Total Protein 9.1 (*)    Anion gap 17 (*)    All other components within normal limits  URINALYSIS, COMPLETE (UACMP) WITH MICROSCOPIC - Abnormal; Notable for the following components:   Hgb urine dipstick LARGE (*)    Ketones, ur TRACE (*)    Leukocytes,Ua TRACE (*)    All other components within normal limits  URINE CULTURE     EKG  none   RADIOLOGY I, Rudene Re, attending MD, have personally viewed and interpreted the images obtained during this visit as below:  KUB with no signs of severe constipation, obstruction, or  impaction   ___________________________________________________ Interpretation by Radiologist:  DG Abdomen 1 View  Result Date: 06/08/2021 CLINICAL DATA:  Constipation. EXAM: ABDOMEN - 1 VIEW COMPARISON:  Acute abdomen series 02/23/2012 FINDINGS: Supine abdomen shows diffuse gaseous distention of small bowel and colon without an overtly obstructive pattern. Small to moderate stool volume noted transverse and left colon. Lumbar degenerative changes again noted. IMPRESSION: Diffuse gaseous distention of small bowel and colon without an overtly obstructive pattern. Small to moderate stool volume in the mid colon. Electronically Signed   By: Misty Stanley M.D.   On: 06/08/2021 06:28      PROCEDURES:  Critical Care performed: No  Procedures    IMPRESSION / MDM / ASSESSMENT AND PLAN / ED COURSE  I reviewed the triage vital signs and the nursing notes.  61 y.o. 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  Bladder scan showing greater than 600 mL.  Patient with no prior history of urinary retention.  Also complained of constipation x2 days.  Ddx: Urinary retention from enlarged prostate from cancer versus from constipation versus from UTI   Plan: Foley placement, CBC, BMP, urinalysis, urine culture, KUB   MEDICATIONS GIVEN IN ED: Medications  tamsulosin (FLOMAX) capsule 0.4 mg (has no administration in time range)  sulfamethoxazole-trimethoprim (BACTRIM DS) 800-160 MG per tablet 1 tablet (has no administration in time range)     ED COURSE: Initial attempt at Foley catheter was unsuccessful.  A daily 16 [place] was used and inserted without difficulty.  800 mL of clear urine returned.  UA with no signs of infection however culture has been sent and patient has been started on Bactrim.  Small amount of stool in the mid colon with no signs of severe constipation or small bowel  obstruction.  After catheter was placed and urine was drained patient reports full resolution of his symptoms.  We will start patient on Flomax and refer him to urology.  Discussed Foley care and my standard return precautions.  Admission was considered but felt unnecessary especially since patient wants to go home, he does not have sepsis, UTI, AKI, and symptoms have fully resolved with a Foley catheter.  He does have pretty close follow-up.  Discussed strict return precautions for any signs of UTI.   Consults: none   EMR reviewed including recent visit to his hematologist/oncologist from 3 days ago    FINAL CLINICAL IMPRESSION(S) / ED DIAGNOSES   Final diagnoses:  Urinary retention     Rx / DC Orders   ED Discharge Orders  Ordered    tamsulosin (FLOMAX) 0.4 MG CAPS capsule  Daily        06/08/21 0633    sulfamethoxazole-trimethoprim (BACTRIM DS) 800-160 MG tablet  2 times daily        06/08/21 8280    Ambulatory referral to Urology       Comments: Urinary retention s/p foley catheter, needs TOV   06/08/21 0349             Note:  This document was prepared using Dragon voice recognition software and may include unintentional dictation errors.   Please note:  Patient was evaluated in Emergency Department today for the symptoms described in the history of present illness. Patient was evaluated in the context of the global COVID-19 pandemic, which necessitated consideration that the patient might be at risk for infection with the SARS-CoV-2 virus that causes COVID-19. Institutional protocols and algorithms that pertain to the evaluation of patients at risk for COVID-19 are in a state of rapid change based on information released by regulatory bodies including the CDC and federal and state organizations. These policies and algorithms were followed during the patient's care in the ED.  Some ED evaluations and interventions may be delayed as a result of limited staffing  during the pandemic.       Alfred Levins, [place], MD 06/08/21 [phone]

## 2021-06-09 LAB — URINE CULTURE: Culture: NO GROWTH

## 2021-06-11 DIAGNOSIS — C772 Secondary and unspecified malignant neoplasm of intra-abdominal lymph nodes: Principal | ICD-10-CM

## 2021-06-11 DIAGNOSIS — C775 Secondary and unspecified malignant neoplasm of intrapelvic lymph nodes: Principal | ICD-10-CM

## 2021-06-11 DIAGNOSIS — C61 Malignant neoplasm of prostate: Principal | ICD-10-CM

## 2021-06-18 ENCOUNTER — Ambulatory Visit: Admit: 2021-06-18 | Discharge: 2021-06-19 | Payer: PRIVATE HEALTH INSURANCE

## 2021-06-25 ENCOUNTER — Ambulatory Visit: Payer: Self-pay

## 2021-06-25 ENCOUNTER — Other Ambulatory Visit: Payer: Self-pay

## 2021-06-25 ENCOUNTER — Ambulatory Visit: Payer: Self-pay | Admitting: Urology

## 2021-06-25 ENCOUNTER — Encounter: Payer: Self-pay | Admitting: Urology

## 2021-06-25 ENCOUNTER — Ambulatory Visit (INDEPENDENT_AMBULATORY_CARE_PROVIDER_SITE_OTHER): Payer: Medicaid Other | Admitting: Urology

## 2021-06-25 VITALS — BP 127/79 | HR 106 | Ht 69.0 in | Wt 159.0 lb

## 2021-06-25 DIAGNOSIS — Z87898 Personal history of other specified conditions: Secondary | ICD-10-CM | POA: Diagnosis not present

## 2021-06-25 DIAGNOSIS — R339 Retention of urine, unspecified: Secondary | ICD-10-CM | POA: Diagnosis not present

## 2021-06-25 DIAGNOSIS — C61 Malignant neoplasm of prostate: Secondary | ICD-10-CM

## 2021-06-25 LAB — BLADDER SCAN AMB NON-IMAGING

## 2021-06-25 NOTE — Progress Notes (Signed)
Catheter Removal  Patient is present today for a catheter removal.  53ml of water was drained from the balloon. A 16FR coude foley cath was removed from the bladder no complications were noted . Patient tolerated well.  Performed by: Elberta Leatherwood, CMA  Follow up/ Additional notes: PM PVR

## 2021-06-25 NOTE — Progress Notes (Signed)
° °  06/25/2021 9:19 AM   [name] [birth year] [id number]  Referring provider: Crissie Figures, PA-C [address] [place],  [place] [address]  Chief Complaint  Patient presents with   Urinary Retention    HPI: Louis Gardner is a 61 y.o. male who presents for follow-up of recent ED visit for urinary retention.  [facility] ED visit 06/08/2021 for urinary retention No prior history of voiding problems Seen by the surgical oncology at [facility] 04/17/2021 for inguinal and pelvic/retroperitoneal adenopathy noted on CT surveillance for pancreatic mass CT-guided biopsy performed 05/29/2021 which was + metastatic prostate cancer Seen by medical oncology at [facility] 06/04/2021; PSMA-PET was ordered and he has a follow-up appointment with oncology later this month Foley catheter placed in ED and he was started on tamsulosin   PMH: Past Medical History:  Diagnosis Date   Arthritis    Depression    GI bleeding    Hypertension    Sleep apnea     Surgical History: Past Surgical History:  Procedure Laterality Date   COLONOSCOPY     ESOPHAGOGASTRODUODENOSCOPY (EGD) WITH PROPOFOL N/A 03/19/2016   Procedure: ESOPHAGOGASTRODUODENOSCOPY (EGD) WITH PROPOFOL;  Surgeon: Lollie Sails, MD;  Location: [facility] ENDOSCOPY;  Service: Endoscopy;  Laterality: N/A;    Home Medications:  Allergies as of 06/25/2021   No Known Allergies      Medication List        Accurate as of June 25, 2021  9:19 AM. If you have any questions, ask your nurse or doctor.          STOP taking these medications    famotidine 20 MG tablet Commonly known as: Pepcid Stopped by: Abbie Sons, MD       TAKE these medications    amLODipine 10 MG tablet Commonly known as: NORVASC Take 10 mg by mouth daily.   buPROPion 150 MG 12 hr tablet Commonly known as: WELLBUTRIN SR Take 150 mg by mouth 2 (two) times daily.   Calcium Carb-Cholecalciferol 600-20 MG-MCG Tabs Take by mouth.   hydrochlorothiazide 12.5 MG  tablet Commonly known as: HYDRODIURIL Take 12.5 mg by mouth daily.   pantoprazole 40 MG tablet Commonly known as: PROTONIX Take 40 mg by mouth daily.   pravastatin 20 MG tablet Commonly known as: PRAVACHOL   tamsulosin 0.4 MG Caps capsule Commonly known as: FLOMAX Take 1 capsule (0.4 mg total) by mouth daily.        Allergies: No Known Allergies  Family History: History reviewed. No pertinent family history.  Social History:  reports that he has been smoking. He has never used smokeless tobacco. He reports current alcohol use. He reports that he does not use drugs.   Physical Exam: BP 127/79    Pulse (!) 106    Ht 5\' 9"  (1.753 m)    Wt 159 lb (72.1 kg)    BMI 23.48 kg/m   Constitutional:  Alert and oriented, No acute distress. HEENT: [place] AT, moist mucus membranes.  Trachea midline, no masses. Cardiovascular: No clubbing, cyanosis, or edema. Respiratory: Normal respiratory effort, no increased work of breathing. Psychiatric: Normal mood and affect.   Assessment & Plan:    1.  Urinary retention Catheter removed and he will follow-up this afternoon for bladder scan for PVR  2.  Metastatic prostate cancer Scheduled for medical oncology follow-up at [facility] later this month   Abbie Sons, MD  [facility] [address], [place], [place] [address] [phone]

## 2021-06-27 ENCOUNTER — Ambulatory Visit: Admit: 2021-06-27 | Discharge: 2021-06-28 | Payer: PRIVATE HEALTH INSURANCE

## 2021-06-28 ENCOUNTER — Ambulatory Visit: Payer: Self-pay | Admitting: Urology

## 2021-07-02 ENCOUNTER — Encounter: Payer: Self-pay | Admitting: Intensive Care

## 2021-07-02 ENCOUNTER — Other Ambulatory Visit: Payer: Self-pay

## 2021-07-02 ENCOUNTER — Emergency Department
Admission: EM | Admit: 2021-07-02 | Discharge: 2021-07-02 | Disposition: A | Discharge status: Home / Self Care | Payer: Medicaid Other | Attending: Emergency Medicine | Admitting: Emergency Medicine

## 2021-07-02 DIAGNOSIS — Z8507 Personal history of malignant neoplasm of pancreas: Secondary | ICD-10-CM | POA: Diagnosis not present

## 2021-07-02 DIAGNOSIS — R339 Retention of urine, unspecified: Secondary | ICD-10-CM | POA: Insufficient documentation

## 2021-07-02 DIAGNOSIS — I10 Essential (primary) hypertension: Secondary | ICD-10-CM | POA: Diagnosis not present

## 2021-07-02 HISTORY — DX: Malignant neoplasm of pancreas, unspecified: C25.9

## 2021-07-02 LAB — URINALYSIS, ROUTINE W REFLEX MICROSCOPIC
Bacteria, UA: NONE SEEN
Bilirubin Urine: NEGATIVE
Glucose, UA: NEGATIVE mg/dL
Ketones, ur: NEGATIVE mg/dL
Leukocytes,Ua: NEGATIVE
Nitrite: NEGATIVE
Protein, ur: NEGATIVE mg/dL
Specific Gravity, Urine: 1.008 (ref 1.005–1.030)
pH: 5 (ref 5.0–8.0)

## 2021-07-02 NOTE — ED Notes (Signed)
Placed a leg bag on pt at this time

## 2021-07-02 NOTE — ED Triage Notes (Addendum)
Patient reports he hasn't been able to pee in 3 days. C/o pelvic pain. Recently had catheter and was removed about a week ago here at [facility]. Recently diagnosed with pancreatic cancer

## 2021-07-02 NOTE — ED Provider Notes (Signed)
[facility] Provider Note    Event Date/Time   First MD Initiated Contact with Patient 07/02/21 1927     (approximate)   History   Urinary Retention   HPI  Louis Gardner is a 61 y.o. male with past medical history of HTN, depression, arthritis, GI bleeding, OSA and pancreatic cancer as well as previous issues with urinary retention previously requiring Foley placement subsequently this was removed presents for evaluation of 3 days of no urine output and worsening suprapubic pressure.  He denies any other associated symptoms including headache, earache, sore throat, chest pain, cough, sore throat, fevers, diarrhea back pain or any other sick symptoms.   Past Medical History:  Diagnosis Date   Arthritis    Depression    GI bleeding    Hypertension    Pancreatic cancer [facility])    Sleep apnea          Physical Exam  Triage Vital Signs: ED Triage Vitals  Enc Vitals Group     BP 07/02/21 1850 (!) 157/106     Pulse Rate 07/02/21 1850 (!) 107     Resp 07/02/21 1850 20     Temp 07/02/21 1850 99.8 F (37.7 C)     Temp Source 07/02/21 1850 Oral     SpO2 07/02/21 1850 96 %     Weight 07/02/21 1851 159 lb (72.1 kg)     Height 07/02/21 1851 5\' 9"  (1.753 m)     Head Circumference --      Peak Flow --      Pain Score 07/02/21 1851 10     Pain Loc --      Pain Edu? --      Excl. in [place]? --     Most recent vital signs: Vitals:   07/02/21 1850  BP: (!) 157/106  Pulse: (!) 107  Resp: 20  Temp: 99.8 F (37.7 C)  SpO2: 96%    General: Awake, no distress.  CV:  Good peripheral perfusion.  Resp:  Normal effort.  Abd:  No distention.  Suprapubic region is soft.  Foley is in place with yellow urine in it. Other:     ED Results / Procedures / Treatments  Labs (all labs ordered are listed, but only abnormal results are displayed) Labs Reviewed  URINALYSIS, ROUTINE W REFLEX MICROSCOPIC - Abnormal; Notable for the following components:       Result Value   Color, Urine YELLOW (*)    APPearance CLEAR (*)    Hgb urine dipstick MODERATE (*)    All other components within normal limits     EKG     RADIOLOGY   PROCEDURES:  Critical Care performed: No  Procedures   MEDICATIONS ORDERED IN ED: Medications - No data to display   IMPRESSION / MDM / [place] / ED COURSE  I reviewed the triage vital signs and the nursing notes.                              Patient presents for evaluation of recurrent urinary retention.  He had a Foley placed in triage with over 1 L of urine noted in Foley bag.  He is feeling much better.  On my assessment he is hemodynamically stable and his belly is soft.  I have a low suspicion for bladder rupture, sepsis, pyelonephritis or other immediate life-threatening process.  UA sent has some blood but otherwise  no evidence of infection.  I think he is stable for discharge with outpatient urology follow-up.  Discharged in stable condition.  All questions answered and there are no other acute concerns at time of discharge.     FINAL CLINICAL IMPRESSION(S) / ED DIAGNOSES   Final diagnoses:  Urinary retention  Hypertension, unspecified type     Rx / DC Orders   ED Discharge Orders     None        Note:  This document was prepared using Dragon voice recognition software and may include unintentional dictation errors.   Lucrezia Starch, MD 07/02/21 [phone]

## 2021-07-23 ENCOUNTER — Other Ambulatory Visit: Payer: Self-pay

## 2021-07-23 ENCOUNTER — Ambulatory Visit (INDEPENDENT_AMBULATORY_CARE_PROVIDER_SITE_OTHER): Payer: Medicaid Other | Admitting: Urology

## 2021-07-23 ENCOUNTER — Encounter: Payer: Self-pay | Admitting: Urology

## 2021-07-23 VITALS — BP 116/82 | HR 109 | Ht 69.0 in | Wt 159.0 lb

## 2021-07-23 DIAGNOSIS — C61 Malignant neoplasm of prostate: Secondary | ICD-10-CM | POA: Diagnosis not present

## 2021-07-23 DIAGNOSIS — R339 Retention of urine, unspecified: Secondary | ICD-10-CM

## 2021-07-24 ENCOUNTER — Encounter: Payer: Self-pay | Admitting: Urology

## 2021-07-24 NOTE — Progress Notes (Signed)
? ?07/23/2021 ?3:12 PM  ? ?[name] ?1960/05/11 ?[id number] ? ?Referring provider: Crissie Figures, PA-C ?[address] ?[place],  [place] [address] ? ?Chief Complaint  ?Patient presents with  ? Urinary Retention  ? ? ?HPI: ?Louis Gardner is a 61 y.o. male who presents for follow-up of urinary retention. ? ?Initially seen 06/25/2021 for cath removal/voiding trial ?He did not follow-up that afternoon for a repeat residual however did present to the ED 07/02/2021 with symptomatic urinary retention and had a Foley catheter placed at that time which has remained indwelling ?No complaints today ? ?Clinical summary: ?[facility] ED visit 06/08/2021 for urinary retention ?No prior history of voiding problems ?Seen by the surgical oncology at [facility] 04/17/2021 for inguinal and pelvic/retroperitoneal adenopathy noted on CT surveillance for pancreatic mass ?CT-guided biopsy performed 05/29/2021 which was + metastatic prostate cancer ?Seen by medical oncology at [facility] 06/04/2021; PSMA-PET was ordered and he has a follow-up appointment with oncology later this month ?Foley catheter placed in ED and he was started on tamsulosin ? ? ?PMH: ?Past Medical History:  ?Diagnosis Date  ? Arthritis   ? Depression   ? GI bleeding   ? Hypertension   ? Pancreatic cancer ([place])   ? Sleep apnea   ? ? ?Surgical History: ?Past Surgical History:  ?Procedure Laterality Date  ? COLONOSCOPY    ? ESOPHAGOGASTRODUODENOSCOPY (EGD) WITH PROPOFOL N/A 03/19/2016  ? Procedure: ESOPHAGOGASTRODUODENOSCOPY (EGD) WITH PROPOFOL;  Surgeon: Lollie Sails, MD;  Location: [facility] ENDOSCOPY;  Service: Endoscopy;  Laterality: N/A;  ? ? ?Home Medications:  ?Allergies as of 07/23/2021   ?No Known Allergies ?  ? ?  ?Medication List  ?  ? ?  ? Accurate as of July 23, 2021 11:59 PM. If you have any questions, ask your nurse or doctor.  ?  ?  ? ?  ? ?amLODipine 10 MG tablet ?Commonly known as: NORVASC ?Take 10 mg by mouth daily. ?  ?buPROPion 150 MG 12 hr tablet ?Commonly known as:  WELLBUTRIN SR ?Take 150 mg by mouth 2 (two) times daily. ?  ?Calcium Carb-Cholecalciferol 600-20 MG-MCG Tabs ?Take by mouth. ?  ?hydrochlorothiazide 12.5 MG tablet ?Commonly known as: HYDRODIURIL ?Take 12.5 mg by mouth daily. ?  ?pantoprazole 40 MG tablet ?Commonly known as: PROTONIX ?Take 40 mg by mouth daily. ?  ?pravastatin 20 MG tablet ?Commonly known as: PRAVACHOL ?  ? ?  ? ? ?Allergies: No Known Allergies ? ?Family History: ?No family history on file. ? ?Social History:  reports that he has been smoking cigarettes. He has never used smokeless tobacco. He reports current alcohol use of about 6.0 standard drinks per week. He reports that he does not use drugs. ? ? ?Physical Exam: ?BP 116/82   Pulse (!) 109   Ht '5\' 9"'$  (1.753 m)   Wt 159 lb (72.1 kg)   BMI 23.48 kg/m?   ?Constitutional:  Alert and oriented, No acute distress. ?HEENT: [place] AT, moist mucus membranes.  Trachea midline, no masses. ?Cardiovascular: No clubbing, cyanosis, or edema. ?Respiratory: Normal respiratory effort, no increased work of breathing. ?Psychiatric: Normal mood and affect. ? ? ?Assessment & Plan:   ? ?1.  Urinary retention ?He desires to keep his Foley catheter indwelling for now ?He has a follow-up with oncology at [facility] next week ?We discussed that prostate cancer treatments can reduce prostate size and potentially allow voiding however this may take several weeks ?Depending on his plan therapy a channel TURP could be performed as his symptoms are  most likely obstructive ? ?2.  Metastatic prostate cancer ?Scheduled for medical oncology follow-up next week ?As above ? ? ?Abbie Sons, MD ? ?[facility] ?[address], [address] ?[place], [place] [address] ?[phone] ? ?

## 2021-07-30 ENCOUNTER — Institutional Professional Consult (permissible substitution): Admit: 2021-07-30 | Discharge: 2021-07-30 | Payer: PRIVATE HEALTH INSURANCE

## 2021-07-30 ENCOUNTER — Other Ambulatory Visit: Admit: 2021-07-30 | Discharge: 2021-07-30 | Payer: PRIVATE HEALTH INSURANCE

## 2021-07-30 ENCOUNTER — Ambulatory Visit: Admit: 2021-07-30 | Discharge: 2021-07-30 | Payer: PRIVATE HEALTH INSURANCE

## 2021-07-30 DIAGNOSIS — C61 Malignant neoplasm of prostate: Principal | ICD-10-CM

## 2021-07-30 DIAGNOSIS — C775 Secondary and unspecified malignant neoplasm of intrapelvic lymph nodes: Principal | ICD-10-CM

## 2021-07-30 DIAGNOSIS — C772 Secondary and unspecified malignant neoplasm of intra-abdominal lymph nodes: Principal | ICD-10-CM

## 2021-07-30 MED ORDER — DAROLUTAMIDE 300 MG TABLET
ORAL_TABLET | Freq: Two times a day (BID) | ORAL | 11 refills | 30 days | Status: CP
Start: 2021-07-30 — End: ?
  Filled 2021-08-02: qty 120, 30d supply, fill #0

## 2021-08-01 NOTE — Unmapped (Signed)
[facility]   Patient Onboarding/Medication Counseling    Mark Stephenson is a 61 y.o. male with metastatic prostate cancer who I am counseling today on initiation of therapy.  I am speaking to the patient.    Was a Nurse, learning disability used for this call? No    Verified patient's date of birth / HIPAA.    Specialty medication(s) to be sent: Hematology/Oncology: Nubeqa    Non-specialty medications/supplies to be sent: none    Medications not needed at this time: none     Nubeqa (Darolutamide)    Medication & Administration     Dosage: Take 2 tablets (600mg ) by mouth twice daily with food.  (Take 2 tablets in the morning and 2 tablets in the evening)    Administration:   ??? Take with food  ??? Swallow whole  ??? Take with calcium and vitamin D    Adherence/Missed dose instructions:  ??? Take a missed dose as soon as you think about it and go back to your normal time.  ??? Do not take 2 doses at the same time or extra doses.    Goals of Therapy     ??? Prevent prostate cancer progression    Side Effects & Monitoring Parameters     ??? Common side effects  ??? Fatigue, loss of strength or energy  ??? Pain in arms or legs  ??? Rash    ??? The following side effects should be reported to the provider:  ??? Allergic reaction  ??? Infections (fever, chills, cough, sore throat)    Contraindications, Warnings, & Precautions     ??? Bone marrow suppression  ??? Hepatic impairment - patients with moderate hepatic impairment have increased exposure to the drug. A reduced dose is recommended   ??? Renal impairment  - patients with severe renal impairment who are not receiving hemodialysis have increased exposure to the drug. A reduced dose is recommended.  ??? Males with male partners of reproductive potential should use effective contraception during treatment and for 1 week after the last dose.    Drug/Food Interactions     ??? Medication list reviewed in Epic. The patient was instructed to inform the care team before taking any new medications or supplements. Darolutamide may increase the serum concentration of OATP1B1/1B3 (SLCO1B1/1B3) Substrates like pravastatin - monitor therapy and consider pravastatin dose reduction if necessary.   ???   Storage, Handling Precautions, & Disposal     ??? Store room temperature  ??? Keep away from children and pets  ??? This drug is considered hazardous and should be handled as little as possible.  If someone else helps with medication administration, they should wear gloves.    Current Medications (including OTC/herbals), Comorbidities and Allergies     Current Outpatient Medications   Medication Sig Dispense Refill   ??? amLODIPine (NORVASC) 10 MG tablet Take 1 tablet (10 mg total) by mouth daily.     ??? buPROPion (WELLBUTRIN SR) 150 MG 12 hr tablet Take 1 tablet (150 mg total) by mouth.     ??? calcium carbonate-vitamin D3 600 mg-20 mcg (800 unit) Tab Take 1 tablet by mouth two (2) times a day. 60 tablet 11   ??? darolutamide (NUBEQA) 300 mg tablet Take 2 tablets (600 mg total) by mouth in the morning and 2 tablets (600 mg total) in the evening. Take with food. Swallow tablets whole. 120 tablet 11   ??? hydroCHLOROthiazide (HYDRODIURIL) 25 MG tablet      ??? pantoprazole (PROTONIX)  40 MG tablet Take 1 tablet (40 mg total) by mouth.     ??? pravastatin (PRAVACHOL) 20 MG tablet      ??? tamsulosin (FLOMAX) 0.4 mg capsule        No current facility-administered medications for this visit.       No Known Allergies    Patient Active Problem List   Diagnosis   ??? Essential hypertension   ??? Non-cardiac chest pain   ??? Chronic bilateral low back pain without sciatica   ??? History of seizures   ??? Leg weakness, bilateral   ??? Low back pain with right-sided sciatica   ??? 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    Appropriateness of Therapy     Acute infections noted within Epic:  No active infections  Patient reported infection: None    Is medication and dose appropriate based on diagnosis and infection status? Yes    Prescription has been clinically reviewed: Yes      Baseline Quality of Life Assessment      How many days over the past month did your metastatic prostate cancer  keep you from your normal activities? For example, brushing your teeth or getting up in the morning. 0    Financial Information     Medication Assistance provided: None Required    Anticipated copay of $4 / 30 days reviewed with patient. Verified delivery address.    Delivery Information     Scheduled delivery date: 08/03/21    Expected start date: As soon as he receives delivery    Medication will be delivered via Next Day Courier to the prescription address in Epic [place].  This shipment will not require a signature.      Explained the services we provide at [facility] and that each month we would call to set up refills.  Stressed importance of returning phone calls so that we could ensure they receive their medications in time each month.  Informed patient that we should be setting up refills 7-10 days prior to when they will run out of medication.  A pharmacist will reach out to perform a clinical assessment periodically.  Informed patient that a welcome packet, containing information about our pharmacy and other support services, a Notice of Privacy Practices, and a drug information handout will be sent.      The patient or caregiver noted above participated in the development of this care plan and knows that they can request review of or adjustments to the care plan at any time.      Patient or caregiver verbalized understanding of the above information as well as how to contact the pharmacy at [phone] option 4 with any questions/concerns.  The pharmacy is open Monday through Friday 8:30am-4:30pm.  A pharmacist is available 24/7 via pager to answer any clinical questions they may have.    Patient Specific Needs     - Does the patient have any physical, cognitive, or cultural barriers? No    - Does the patient have adequate living arrangements? (i.e. the ability to store and take their medication appropriately) Yes    - Did you identify any home environmental safety or security hazards? No    - Patient prefers to have medications discussed with  Patient     - Is the patient or caregiver able to  read and understand education materials at a high school level or above? Yes    - Patient's primary language is  English     - Is the patient high risk? Yes, patient is taking oral chemotherapy. Appropriateness of therapy as been assessed    SOCIAL DETERMINANTS OF HEALTH     At the [facility], we have learned that life circumstances - like trouble affording food, housing, utilities, or transportation can affect the health of many of our patients.   That is why we wanted to ask: are you currently experiencing any life circumstances that are negatively impacting your health and/or quality of life? Patient declined to answer (did not discuss)    Social Determinants of Health     Financial Resource Strain: Not on file   Internet Connectivity: Not on file   Food Insecurity: Not on file   Tobacco Use: High Risk   ??? Smoking Tobacco Use: Every Day   ??? Smokeless Tobacco Use: Never   ??? Passive Exposure: Not on file   Housing/Utilities: Not on file   Alcohol Use: Not on file   Transportation Needs: Not on file   Substance Use: Not on file   Health Literacy: Not on file   Physical Activity: Not on file   Interpersonal Safety: Not on file   Stress: Not on file   Intimate Partner Violence: Not on file   Depression: Not on file   Social Connections: Not on file       Would you be willing to receive help with any of the needs that you have identified today? Not applicable       Mark Stephenson A [facility] Specialty Pharmacist

## 2021-08-01 NOTE — Unmapped (Signed)
[facility] Specialty Medication Onboarding    Specialty Medication: Nubeqa  Prior Authorization: Not Required   Financial Assistance: No - copay  <$25  Final Copay/Day Supply: $4 / 30    Insurance Restrictions: None     Notes to Pharmacist:     The triage team has completed the benefits investigation and has determined that the patient is able to fill this medication at [facility]. Please contact the patient to complete the onboarding or follow up with the prescribing physician as needed.

## 2021-08-03 ENCOUNTER — Encounter: Payer: Self-pay | Admitting: Emergency Medicine

## 2021-08-03 ENCOUNTER — Emergency Department
Admission: EM | Admit: 2021-08-03 | Discharge: 2021-08-03 | Disposition: A | Discharge status: Home / Self Care | Payer: Medicaid Other | Attending: Emergency Medicine | Admitting: Emergency Medicine

## 2021-08-03 ENCOUNTER — Other Ambulatory Visit: Payer: Self-pay

## 2021-08-03 DIAGNOSIS — T83098A Other mechanical complication of other indwelling urethral catheter, initial encounter: Secondary | ICD-10-CM | POA: Insufficient documentation

## 2021-08-03 DIAGNOSIS — Y846 Urinary catheterization as the cause of abnormal reaction of the patient, or of later complication, without mention of misadventure at the time of the procedure: Secondary | ICD-10-CM | POA: Insufficient documentation

## 2021-08-03 DIAGNOSIS — C8396 Non-follicular (diffuse) lymphoma, unspecified, intrapelvic lymph nodes: Secondary | ICD-10-CM | POA: Diagnosis not present

## 2021-08-03 DIAGNOSIS — T839XXD Unspecified complication of genitourinary prosthetic device, implant and graft, subsequent encounter: Secondary | ICD-10-CM

## 2021-08-03 DIAGNOSIS — C61 Malignant neoplasm of prostate: Secondary | ICD-10-CM

## 2021-08-03 DIAGNOSIS — Z8546 Personal history of malignant neoplasm of prostate: Secondary | ICD-10-CM | POA: Insufficient documentation

## 2021-08-03 NOTE — Discharge Instructions (Signed)
You were seen today for tear in your Foley bag.  This was replaced.  Please follow-up with urologist/oncologist as previously scheduled ?

## 2021-08-03 NOTE — ED Notes (Signed)
Changed leg bag  ?

## 2021-08-03 NOTE — ED Provider Notes (Signed)
? ?  [facility] ?Provider Note ? ? ? Event Date/Time  ? First MD Initiated Contact with Patient 08/03/21 2139   ?  (approximate) ? ? ?History  ? ?Other ? ? ?HPI ? ?Louis Gardner is a 61 y.o. male presents to the ER today with complaint of his Foley bag leaking.  He has a long-term Foley secondary to metastatic prostate cancer.  He does not feel like he is leaking around the Foley but the bag has a tear in it.  He would like his catheter bag changed today. ? ?  ? ? ?Physical Exam  ? ?Triage Vital Signs: ?ED Triage Vitals  ?Enc Vitals Group  ?   BP 08/03/21 2137 113/73  ?   Pulse Rate 08/03/21 2137 81  ?   Resp 08/03/21 2137 16  ?   Temp 08/03/21 2137 100.3 ?F (37.9 ?C)  ?   Temp Source 08/03/21 2137 Oral  ?   SpO2 08/03/21 2137 95 %  ?   Weight 08/03/21 2138 149 lb (67.6 kg)  ?   Height 08/03/21 2138 '5\' 9"'$  (1.753 m)  ?   Head Circumference --   ?   Peak Flow --   ?   Pain Score 08/03/21 2137 0  ?   Pain Loc --   ?   Pain Edu? --   ?   Excl. in [place]? --   ? ? ?Most recent vital signs: ?Vitals:  ? 08/03/21 2137  ?BP: 113/73  ?Pulse: 81  ?Resp: 16  ?Temp: 100.3 ?F (37.9 ?C)  ?SpO2: 95%  ? ? ? ?General: Awake, no distress.  ?CV:  RRR, no murmur noted. ?Resp:  Normal effort.  CTA bilaterally. ?GU:  Foley site intact.  Leaking noted from urine bag. ? ? ?ED Results / Procedures / Treatments  ? ? ? ?MEDICATIONS ORDERED IN ED: ?Medications - No data to display ? ? ?IMPRESSION / MDM / ASSESSMENT AND PLAN / ED COURSE  ?I reviewed the triage vital signs and the nursing notes. ?  ?Foley Complication, Metastatic Prostate Cancer: ? ?Foley bag changed without complication ?He will follow-up with his urology/oncology as previously scheduled ?  ? ? ?FINAL CLINICAL IMPRESSION(S) / ED DIAGNOSES  ? ?Final diagnoses:  ?Complication of Foley catheter, subsequent encounter  ?Prostate cancer metastatic to intrapelvic lymph node ([place])  ? ? ? ?Rx / DC Orders  ? ?ED Discharge Orders   ? ? None  ? ?  ? ? ? ?Note:  This  document was prepared using Dragon voice recognition software and may include unintentional dictation errors. ? ?  ?Jearld Fenton, NP ?08/03/21 2142 ? ?  ?Nance Pear, MD ?08/03/21 2216 ? ?

## 2021-08-03 NOTE — ED Triage Notes (Signed)
Pt presents to ER from home reports has indwelling catheter for a week, reports leg bag has a tear and leaking. RN changed leg bag no other complaints  ?

## 2021-08-21 NOTE — Unmapped (Signed)
I spoke with Mr. Province about the PSMAddition trial, which is testing up front Pluvicto with ADT + ARSI. I discussed that he would not start chemotherapy, but instead need to be screened for the trial, which would include another PET scan. He reports feeling much better after starting ADT, with improved energy and appetite, so I do not believe he needs urgent chemotherapy. He also has a life expectancy greater than 9 months given his new diagnosis of mHSPC.    He will think about the trial and wants to learn more from our research coordinator. I asked that he make the decision prior to his chemotherapy appointment next week.    Laurette Schimke Hyacinth Meeker, MD, PhD  Assistant Professor of Medicine, Division of Oncology  [facility]  Genitourinary Oncology Clinic

## 2021-08-21 NOTE — Unmapped (Signed)
Study coordinator called patient to discuss [phone], PSMAddition trial. A copy of the consent is being mailed to the patient. Patient showed interest and will call if interested in pursuing the trial after reviewing consent.  ??  Einar Grad, [employer]  [phone]

## 2021-08-24 NOTE — Unmapped (Addendum)
 [facility] Clinical Assessment & Refill Coordination Note    Mark Stephenson, DOB: 1960/07/15  Phone: [phone] (home)     All above HIPAA information was verified with patient.     Was a Nurse, learning disability used for this call? No    Specialty Medication(s):   Hematology/Oncology: Mark Stephenson     Current Outpatient Medications   Medication Sig Dispense Refill    amLODIPine (NORVASC) 10 MG tablet Take 1 tablet (10 mg total) by mouth daily.      buPROPion (WELLBUTRIN SR) 150 MG 12 hr tablet Take 1 tablet (150 mg total) by mouth.      calcium carbonate-vitamin D3 600 mg-20 mcg (800 unit) Tab Take 1 tablet by mouth two (2) times a day. 60 tablet 11    darolutamide (NUBEQA) 300 mg tablet Take 2 tablets (600 mg total) by mouth in the morning and 2 tablets (600 mg total) in the evening. Take with food. Swallow tablets whole. 120 tablet 11    hydroCHLOROthiazide (HYDRODIURIL) 25 MG tablet       pantoprazole (PROTONIX) 40 MG tablet Take 1 tablet (40 mg total) by mouth.      pravastatin (PRAVACHOL) 20 MG tablet       tamsulosin (FLOMAX) 0.4 mg capsule        No current facility-administered medications for this visit.        Changes to medications: Mark Stephenson reports no changes at this time.    No Known Allergies    Changes to allergies: No    SPECIALTY MEDICATION ADHERENCE     Nubeqa 300 mg: ~8 days of medicine on hand     Medication Adherence    Patient reported X missed doses in the last month: 0  Specialty Medication: Nubeqa 300mg  2 tab BID  Patient is on additional specialty medications: No  Patient is on more than two specialty medications: No  Demonstrates understanding of importance of adherence: no  Informant: patient  Reliability of informant: reliable  Provider-estimated medication adherence level: good  Patient is at risk for Non-Adherence: No  Confirmed plan for next specialty medication refill: delivery by pharmacy  Refills needed for supportive medications: not needed          Specialty medication(s) dose(s) confirmed: Regimen is correct and unchanged.     Are there any concerns with adherence? No    Adherence counseling provided? Not needed    CLINICAL MANAGEMENT AND INTERVENTION      Clinical Benefit Assessment:    Do you feel the medicine is effective or helping your condition? Yes    Clinical Benefit counseling provided? Not needed    Adverse Effects Assessment:    Are you experiencing any side effects? No    Are you experiencing difficulty administering your medicine? No    Quality of Life Assessment:    Quality of Life    Rheumatology  Oncology  1. What impact has your specialty medication had on the reduction of your daily pain or discomfort level?: Minimal  2. On a scale of 1-10, how would you rate your ability to manage side effects associated with your specialty medication? (1=no issues, 10 = unable to take medication due to side effects): 1  Dermatology  Cystic Fibrosis          How many days over the past month did your prostate cancer  keep you from your normal activities? For example, brushing your teeth or getting up in the morning. 0  Have you discussed this with your provider? Not needed    Acute Infection Status:    Acute infections noted within Epic:  No active infections  Patient reported infection: None    Therapy Appropriateness:    Is therapy appropriate and patient progressing towards therapeutic goals? Yes, therapy is appropriate and should be continued    DISEASE/MEDICATION-SPECIFIC INFORMATION      N/A    PATIENT SPECIFIC NEEDS     Does the patient have any physical, cognitive, or cultural barriers? No    Is the patient high risk? Yes, patient is taking oral chemotherapy. Appropriateness of therapy as been assessed    Does the patient require a Care Management Plan? No     SOCIAL DETERMINANTS OF HEALTH     At the [facility], we have learned that life circumstances - like trouble affording food, housing, utilities, or transportation can affect the health of many of our patients.   That is why we wanted to ask: are you currently experiencing any life circumstances that are negatively impacting your health and/or quality of life? Patient declined to answer    Social Determinants of Health     Financial Resource Strain: Not on file   Internet Connectivity: Not on file   Food Insecurity: Not on file   Tobacco Use: High Risk    Smoking Tobacco Use: Every Day    Smokeless Tobacco Use: Never    Passive Exposure: Not on file   Housing/Utilities: Not on file   Alcohol Use: Not on file   Transportation Needs: Not on file   Substance Use: Not on file   Health Literacy: Not on file   Physical Activity: Not on file   Interpersonal Safety: Not on file   Stress: Not on file   Intimate Partner Violence: Not on file   Depression: Not on file   Social Connections: Not on file       Would you be willing to receive help with any of the needs that you have identified today? Not applicable       SHIPPING     Specialty Medication(s) to be Shipped:   Hematology/Oncology: Nubeqa     Other medication(s) to be shipped: No additional medications requested for fill at this time     Changes to insurance: No    Delivery Scheduled: Yes, Expected medication delivery date: 4/25.     Medication will be delivered via Next Day Courier to the confirmed prescription address in [facility].    The patient will receive a drug information handout for each medication shipped and additional FDA Medication Guides as required.  Verified that patient has previously received a Conservation officer, historic buildings and a Surveyor, mining.    The patient or caregiver noted above participated in the development of this care plan and knows that they can request review of or adjustments to the care plan at any time.      All of the patient's questions and concerns have been addressed.    Mark Stephenson [employer]   [facility] Specialty Pharmacist    I reviewed this patient case and all documentation provided by the learner and was readily available for consultation during their interaction with the patient.  I agree with the assessment and plan listed below.    Breck Coons [facility] Specialty Pharmacist

## 2021-08-27 ENCOUNTER — Ambulatory Visit: Admit: 2021-08-27 | Discharge: 2021-08-28 | Payer: PRIVATE HEALTH INSURANCE

## 2021-08-27 ENCOUNTER — Ambulatory Visit
Admit: 2021-08-27 | Discharge: 2021-08-28 | Payer: PRIVATE HEALTH INSURANCE | Attending: Registered" | Primary: Registered"

## 2021-08-27 ENCOUNTER — Other Ambulatory Visit: Admit: 2021-08-27 | Discharge: 2021-08-28 | Payer: PRIVATE HEALTH INSURANCE

## 2021-08-27 DIAGNOSIS — C772 Secondary and unspecified malignant neoplasm of intra-abdominal lymph nodes: Principal | ICD-10-CM

## 2021-08-27 DIAGNOSIS — C61 Malignant neoplasm of prostate: Principal | ICD-10-CM

## 2021-08-27 DIAGNOSIS — C775 Secondary and unspecified malignant neoplasm of intrapelvic lymph nodes: Principal | ICD-10-CM

## 2021-08-27 LAB — COMPREHENSIVE METABOLIC PANEL
ALBUMIN: 3.3 g/dL — ABNORMAL LOW (ref 3.4–5.0)
ALKALINE PHOSPHATASE: 458 U/L — ABNORMAL HIGH (ref 46–116)
ALT (SGPT): <7 U/L — ABNORMAL LOW (ref 10–49)
ANION GAP: 5 mmol/L (ref 5–14)
AST (SGOT): 14 U/L (ref <=34)
BILIRUBIN TOTAL: 0.9 mg/dL (ref 0.3–1.2)
BLOOD UREA NITROGEN: 10 mg/dL (ref 9–23)
BUN / CREAT RATIO: 13
CALCIUM: 9.2 mg/dL (ref 8.7–10.4)
CHLORIDE: 106 mmol/L (ref 98–107)
CO2: 28 mmol/L (ref 20.0–31.0)
CREATININE: 0.75 mg/dL
EGFR CKD-EPI (2021) MALE: >90 mL/min/{1.73_m2} (ref >=60)
GLUCOSE RANDOM: 117 mg/dL (ref 70–179)
POTASSIUM: 4.8 mmol/L (ref 3.5–5.1)
PROTEIN TOTAL: 7.4 g/dL (ref 5.7–8.2)
SODIUM: 139 mmol/L (ref 135–145)

## 2021-08-27 LAB — CBC W/ AUTO DIFF
BASOPHILS ABSOLUTE COUNT: 0 10*9/L (ref 0.0–0.1)
BASOPHILS RELATIVE PERCENT: 0.4 %
EOSINOPHILS ABSOLUTE COUNT: 0.1 10*9/L (ref 0.0–0.5)
EOSINOPHILS RELATIVE PERCENT: 1.7 %
HEMATOCRIT: 32.9 % — ABNORMAL LOW (ref 39.0–48.0)
HEMOGLOBIN: 10.5 g/dL — ABNORMAL LOW (ref 12.9–16.5)
LYMPHOCYTES ABSOLUTE COUNT: 1.8 10*9/L (ref 1.1–3.6)
LYMPHOCYTES RELATIVE PERCENT: 30.8 %
MEAN CORPUSCULAR HEMOGLOBIN CONC: 31.9 g/dL — ABNORMAL LOW (ref 32.0–36.0)
MEAN CORPUSCULAR HEMOGLOBIN: 25.7 pg — ABNORMAL LOW (ref 25.9–32.4)
MEAN CORPUSCULAR VOLUME: 80.5 fL (ref 77.6–95.7)
MEAN PLATELET VOLUME: 7.2 fL (ref 6.8–10.7)
MONOCYTES ABSOLUTE COUNT: 0.8 10*9/L (ref 0.3–0.8)
MONOCYTES RELATIVE PERCENT: 13.8 %
NEUTROPHILS ABSOLUTE COUNT: 3.1 10*9/L (ref 1.8–7.8)
NEUTROPHILS RELATIVE PERCENT: 53.3 %
PLATELET COUNT: 331 10*9/L (ref 150–450)
RED BLOOD CELL COUNT: 4.09 10*12/L — ABNORMAL LOW (ref 4.26–5.60)
RED CELL DISTRIBUTION WIDTH: 21.2 % — ABNORMAL HIGH (ref 12.2–15.2)
WBC ADJUSTED: 5.7 10*9/L (ref 3.6–11.2)

## 2021-08-27 LAB — PSA: PROSTATE SPECIFIC ANTIGEN: 64.37 ng/mL — ABNORMAL HIGH (ref 0.00–4.00)

## 2021-08-27 MED ORDER — PROCHLORPERAZINE MALEATE 10 MG TABLET
ORAL_TABLET | Freq: Four times a day (QID) | ORAL | 2 refills | 8.00000 days | Status: CP | PRN
Start: 2021-08-27 — End: 2022-08-27

## 2021-08-27 MED ORDER — DEXAMETHASONE 4 MG TABLET
ORAL_TABLET | INTRAMUSCULAR | 2 refills | 0.00000 days | Status: CP
Start: 2021-08-27 — End: 2022-08-27

## 2021-08-27 MED ADMIN — DOCEtaxel (TAXOTERE) 133.5 mg in sodium chloride NON-PVC (NS) 0.9 % 250 mL IVPB: 75 mg/m2 | INTRAVENOUS | @ 18:00:00 | Stop: 2021-08-27

## 2021-08-27 MED ADMIN — sodium chloride (NS) 0.9 % infusion: 100 mL/h | INTRAVENOUS | @ 17:00:00

## 2021-08-27 MED ADMIN — leuprolide acetate (6 month) (ELIGARD) injection 45 mg: 45 mg | SUBCUTANEOUS | @ 16:00:00 | Stop: 2021-08-27

## 2021-08-27 MED ADMIN — dexAMETHasone (DECADRON) tablet 8 mg: 8 mg | ORAL | @ 17:00:00 | Stop: 2021-08-27

## 2021-08-27 MED FILL — NUBEQA 300 MG TABLET: ORAL | 30 days supply | Qty: 120 | Fill #1

## 2021-08-27 NOTE — Unmapped (Signed)
Pharmacy: First Cycle Chemotherapy Patient Education    Chemotherapy regimen/agents: docetaxel  Venous Access: PIV  Caregivers present for education: friend    Mark Stephenson is a 61 y.o. with prostate cancer. Chemotherapy education was provided to the patient by the oncology pharmacy team.    Side effects discussed included but were not limited to:   infusion-related reactions, extravasation, complications associated with myelosuppresion (such as infection/fever, fatigue, and bleeding), nausea/vomiting, diarrhea, constipation, mucositis, taste changes, hair loss, skin/nail changes, peripheral neuropathy, myalgia, or fatigue.    The patient handout from [facility], antiemetic handout, or the Hematology/Oncology Fellow on-call phone number for concerns after hours were given.The patient verbalized understanding of this information.  Medication reconciliation was completed and the medication list was updated in EPIC.      Approximate time spent with patient: 15  Minutes.    Kennon Holter, PharmD, CPP

## 2021-08-27 NOTE — Unmapped (Signed)
[facility] Visit Note    Patient Name: Mark Stephenson  Patient Age: 61 y.o.  Encounter Date: 08/27/2021  Attending Provider:  Laurette Schimke. 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 M with HTN, chronic pancreatitis and high volume mHSPC who will be treated per the ARASENS trial.     He reports feeling better since starting ADT. He has decided not to participate in the PSMAddition trial.    He reports intermittent numbness of the bottom of his left foot that improves with moving. No current numbness.  No pain.     For Docetaxel: Administration, and schedule were reviewed with the patient. Side effects of docetaxel discussed included but were not limited to:  Nausea/vomiting, complications of myelosuppression, alopecia, flu-like symptoms, fatigue, taste changes, mucositis, edema, rash, and peripheral neuropathy.  We discussed techniques to manage some of these toxicities including but not limited to: anti-emetics for N/V, daily salt rinses for mucositis prevention, and low/moderate physical activity to help with fatigue. Patient understands and verbally consents to proceed with chemotherapy infusion today.     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  - local urologist to replace foley every 4 weeks, due in the next week  - hold off on TURP to see if ADT + chemotherapy can shrink prostate, would give voiding trial in about 2 months now that he has started ADT    3) Bone health  - DEXA scan with osteopenia (Frax 2.3%), repeat in Feb 2024  - continue calcium 800mg  daily + vitamin D 1200 international units daily    4) Metabolic health  - recommended daily exercise and heart-healthy diet for metabolic health while on ADT  - given poor appetite and low albumin, recommended Boost or Ensure  - nutrition consult today    Reason for Visit  The patient is seen in consultation at the request of Dunn, Carlyon Shadow, A* for evaluation of prostate cancer.    History of Present Illness:  Oncology History Overview Note   03/19/21 - CT A/P with contrast performed to follow up on pancreatic mass, with near resolution of pancreatic uncinate process. Bulky multistation abdominopelvic lymphadenopathy identified (e.g. left para-aortic node measuring 2.1cm and right common iliac node measuring 1.4cm). No  concerning osseous lesions identified. Enlarged, heterogeneously enhancing prostate gland.  04/17/21 - PSA 48.83  05/29/21 - retroperitoneal lymph node core needle biopsy, pathology consistent with metastatic prostate adenocarcinoma.  06/27/21 - PSMA-PET scan with widely metastatic osseous disease as well as avid adenopathy extending from the pelvis to the left supraclavicular region consistent with metastatic prostate cancer. Noting that there is one single enlarged retroperitoneal node with low/minimal tracer avidity measuring up to 4 cm.  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)     06/04/2021 -  Cancer Staged    Staging form: Prostate, AJCC 8th Edition  - Clinical: Stage IVB (cT1c, cN1, cM1a, PSA: 6) - Signed by Davy Pique, MD on 06/04/2021           08/26/2021 -  Chemotherapy    OP PROSTATE DOCETAXEL/PREDNISONE  Docetaxel 75 mg/m2 evry 21 days       08/27/2021 Endocrine/Hormone Therapy    OP PROSTATE LEUPROLIDE (ELIGARD) 45 MG EVERY 6 MONTHS  Plan Provider: Davy Pique, MD       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 Gained 10 pounds. No fevers, chills.     Allergies:  No Known Allergies    Current Medications:    Current Outpatient Medications:     amLODIPine (NORVASC) 10 MG tablet, Take 1 tablet (10 mg total) by mouth daily., Disp: , Rfl:     buPROPion (WELLBUTRIN SR) 150 MG 12 hr tablet, Take 1 tablet (150 mg total) by mouth., Disp: , Rfl:     calcium carbonate-vitamin D3 600 mg-20 mcg (800 unit) Tab, Take 1 tablet by mouth two (2) times a day., Disp: 60 tablet, Rfl: 11    darolutamide (NUBEQA) 300 mg tablet, Take 2 tablets (600 mg total) by mouth in the morning and 2 tablets (600 mg total) in the evening. Take with food. Swallow tablets whole., Disp: 120 tablet, Rfl: 11    hydroCHLOROthiazide (HYDRODIURIL) 25 MG tablet, , Disp: , Rfl:     pantoprazole (PROTONIX) 40 MG tablet, Take 1 tablet (40 mg total) by mouth., Disp: , Rfl:     pravastatin (PRAVACHOL) 20 MG tablet, , Disp: , Rfl:     tamsulosin (FLOMAX) 0.4 mg capsule, , Disp: , Rfl: 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)        Past Surgical History:   Procedure Laterality Date    CHG X-RAY FOR BILE DUCT ENDOSCOPY  12/08/2019    Procedure: ENDOSCOPIC CATHETERIZATION OF THE BILIARY DUCTAL SYSTEM, RADIOLOGICAL SUPERVISION AND INTERPRETATION;  Surgeon: Chriss Driver, MD;  [facility];  Service: Gastroenterology    PR ERCP,SPHINCTEROTOMY N/A 12/08/2019    Procedure: ERCP; W/SPHINCTEROTOMY/PAPILLOTOMY;  Surgeon: Chriss Driver, MD;  [facility];  Service: Gastroenterology    PR UPGI ENDOSCOPY W/US FN BX N/A 12/08/2019    Procedure: UGI W/ TRANSENDOSCOPIC ULTRASOUND GUIDED INTRAMURAL/TRANSMURAL FINE NEEDLE ASPIRATION/BIOPSY(S), ESOPHAGUS;  Surgeon: Chriss Driver, MD;  Location: [facility];  Service: Gastroenterology        Social History     Occupational History    Occupation: disability   Tobacco Use    Smoking status: Every Day    Smokeless tobacco: Never    Tobacco comments:     1 pack per week. reports gradually.    Vaping Use    Vaping Use: Never used   Substance and Sexual Activity    Alcohol use: Yes     Alcohol/week: 21.0 standard drinks     Types: 21 Cans of beer per week     Comment: 3x 40-once cans of beer per day    Drug use: No    Sexual activity: Not on file       Family History  Family History   Problem Relation Age of Onset    Cancer Father     Cancer Brother     Anesthesia problems Neg Hx      Prostate Cancer Family History Assessment:  History of cancer in children (yes/no; if yes, what type AND age of diagnosis): no children  Total number of siblings (including deceased): 4 brothers, 2 sisters  History of cancer in siblings (yes/no; if yes, provide relation, type of cancer, AND age of diagnosis): oldest brother had unknown type of cancer  History of cancer in parents (yes/no; if yes, please specify parent, type of cancer, AND age of diagnosis): mother - unknown, father passed from unknown cancer  History of cancer in aunts/uncles/grandparents (yes/no; if yes, provide relation, type of cancer, AND age of diagnosis): none known      Review of Systems:  A comprehensive review of 10 systems was negative except for pertinent positives noted in HPI.    Physical Exam:  VITAL SIGNS:  BP 120/70  - Pulse 69  - Temp 35.9 ??C (96.7 ??F) (Temporal)  - Resp 18  - Ht 175.3 cm (5' 9)  - Wt 69.4 kg (153 lb)  - SpO2 100%  - BMI 22.59 kg/m??   ECOG Performance Status: 0  GENERAL: Well-developed, well-nourished patient in no acute distress.  HEAD: Normocephalic and atraumatic.  EYES: Conjunctivae are normal. No scleral icterus.  MOUTH/THROAT: Oropharynx is clear and moist.  No mucosal lesions. Poor dentition, with tooth on the bottom right decaying.  NECK: Supple, no thyromegaly.  CARDIOVASCULAR: Normal rate, regular rhythm and normal heart sounds.  Exam reveals no gallop and no friction rub.  No murmur heard.  PULMONARY/CHEST: Effort normal and breath sounds normal. No respiratory distress.  ABDOMINAL:  Soft. There is no distension. There is no tenderness. There is no rebound and no guarding.  MUSCULOSKELETAL: No clubbing, cyanosis, or lower extremity edema.  PSYCHIATRIC: Alert and oriented.  Normal mood and affect.  NEUROLOGIC: No focal motor deficit. Normal gait.  SKIN: Skin is warm, dry, and intact.      Results/Orders:    Lab on 08/27/2021   Component Date Value Ref Range Status    WBC 08/27/2021 5.7  3.6 - 11.2 10*9/L Final  RBC 08/27/2021 4.09 (L)  4.26 - 5.60 10*12/L Final    HGB 08/27/2021 10.5 (L)  12.9 - 16.5 g/dL Final    HCT 09/81/1914 32.9 (L)  39.0 - 48.0 % Final    MCV 08/27/2021 80.5  77.6 - 95.7 fL Final    MCH 08/27/2021 25.7 (L)  25.9 - 32.4 pg Final    MCHC 08/27/2021 31.9 (L)  32.0 - 36.0 g/dL Final    RDW 78/29/5621 21.2 (H)  12.2 - 15.2 % Final    MPV 08/27/2021 7.2  6.8 - 10.7 fL Final    Platelet 08/27/2021 331  150 - 450 10*9/L Final    Neutrophils % 08/27/2021 53.3  % Final    Lymphocytes % 08/27/2021 30.8  % Final    Monocytes % 08/27/2021 13.8  % Final    Eosinophils % 08/27/2021 1.7  % Final    Basophils % 08/27/2021 0.4  % Final    Absolute Neutrophils 08/27/2021 3.1  1.8 - 7.8 10*9/L Final    Absolute Lymphocytes 08/27/2021 1.8  1.1 - 3.6 10*9/L Final    Absolute Monocytes 08/27/2021 0.8  0.3 - 0.8 10*9/L Final    Absolute Eosinophils 08/27/2021 0.1  0.0 - 0.5 10*9/L Final    Absolute Basophils 08/27/2021 0.0  0.0 - 0.1 10*9/L Final    Anisocytosis 08/27/2021 Moderate (A)  Not Present Final    Hypochromasia 08/27/2021 Moderate (A)  Not Present Final       Lab Results   Component Value Date    PSA 406.39 (H) 07/30/2021    PSA 48.83 (H) 04/17/2021       Orders placed or performed in visit on 08/27/21    CBC and differential    Comprehensive Metabolic Panel    PSA, Diagnostic    Comprehensive Metabolic Panel    CBC w/ Differential    PSA (Prostate Specific Antigen)    Testosterone    Clinic Appointment Request APP    Infusion Appointment Request    LAB Appointment Request    Clinic Appointment Request MD/APP Alternating    LAB Appointment Request

## 2021-08-27 NOTE — Unmapped (Signed)
Pt tolerated Eligard injection without difficulty. [facility] left [facility] ambulatory,steady gait, NAD, no questions, complaints, nor concerns voiced at d/c. Patient given instructions r/t medication . Time spent 10 minutes. Reviewing and answering questions

## 2021-08-27 NOTE — Unmapped (Addendum)
Please remember to take dexamethasone the night before and the morning of your next chemotherapy appointment in 3 weeks.    Please remember to exercise daily and eat a heart-healthy diet.    For bone health while on hormone therapy, please take 800mg  of calcium and 1200 international units of Vitamin D daily.    For questions/concerns please call the nurse triage line [phone] on Monday-Friday 8am-5pm.  On nights, weekends, and holidays, please call [phone].    Laurette Schimke Hyacinth Meeker, MD, PhD  Assistant Professor of Medicine, Division of Oncology  [facility]  Genitourinary Oncology Clinic  Fax: [phone]

## 2021-08-27 NOTE — Unmapped (Unsigned)
PIV placed.  Labs drawn & sent for analysis. To next appt.  Care provided by Daniel Maxwell RN.

## 2021-08-27 NOTE — Unmapped (Unsigned)
Patient arrived to chair 46.  No complaints noted.  Access of PIV intact with blood return. Pre-medicated per treatment plan orders.  Pt tolerated infusion without difficulty. PIV checked for blood return, flushed, and removed. AVS declined. Patient left infusion center, NAD.

## 2021-08-27 NOTE — Unmapped (Signed)
Lab on 08/27/2021   Component Date Value Ref Range Status    Sodium 08/27/2021 139  135 - 145 mmol/L Final    Potassium 08/27/2021 4.8  3.5 - 5.1 mmol/L Final    Chloride 08/27/2021 106  98 - 107 mmol/L Final    CO2 08/27/2021 28.0  20.0 - 31.0 mmol/L Final    Anion Gap 08/27/2021 5  5 - 14 mmol/L Final    BUN 08/27/2021 10  9 - 23 mg/dL Final    Creatinine 81/19/1478 0.75  0.60 - 1.10 mg/dL Final    BUN/Creatinine Ratio 08/27/2021 13   Final    eGFR CKD-EPI (2021) Male 08/27/2021 >90  >=60 mL/min/1.24m2 Final    eGFR calculated with CKD-EPI 2021 equation in accordance with [employer] and [employer] of Nephrology Task Force recommendations.    Glucose 08/27/2021 117  70 - 179 mg/dL Final    Calcium 29/56/2130 9.2  8.7 - 10.4 mg/dL Final    Albumin 86/57/8469 3.3 (L)  3.4 - 5.0 g/dL Final    Total Protein 08/27/2021 7.4  5.7 - 8.2 g/dL Final    Total Bilirubin 08/27/2021 0.9  0.3 - 1.2 mg/dL Final    AST 62/95/2841 14  <=34 U/L Final    ALT 08/27/2021 <7 (L)  10 - 49 U/L Final    Alkaline Phosphatase 08/27/2021 458 (H)  46 - 116 U/L Final    PSA 08/27/2021 64.37 (H)  0.00 - 4.00 ng/mL Final    WBC 08/27/2021 5.7  3.6 - 11.2 10*9/L Final    RBC 08/27/2021 4.09 (L)  4.26 - 5.60 10*12/L Final    HGB 08/27/2021 10.5 (L)  12.9 - 16.5 g/dL Final    HCT 32/44/0102 32.9 (L)  39.0 - 48.0 % Final    MCV 08/27/2021 80.5  77.6 - 95.7 fL Final    MCH 08/27/2021 25.7 (L)  25.9 - 32.4 pg Final    MCHC 08/27/2021 31.9 (L)  32.0 - 36.0 g/dL Final    RDW 72/53/6644 21.2 (H)  12.2 - 15.2 % Final    MPV 08/27/2021 7.2  6.8 - 10.7 fL Final    Platelet 08/27/2021 331  150 - 450 10*9/L Final    Neutrophils % 08/27/2021 53.3  % Final    Lymphocytes % 08/27/2021 30.8  % Final    Monocytes % 08/27/2021 13.8  % Final    Eosinophils % 08/27/2021 1.7  % Final    Basophils % 08/27/2021 0.4  % Final    Absolute Neutrophils 08/27/2021 3.1  1.8 - 7.8 10*9/L Final    Absolute Lymphocytes 08/27/2021 1.8  1.1 - 3.6 10*9/L Final    Absolute Monocytes 08/27/2021 0.8  0.3 - 0.8 10*9/L Final    Absolute Eosinophils 08/27/2021 0.1  0.0 - 0.5 10*9/L Final    Absolute Basophils 08/27/2021 0.0  0.0 - 0.1 10*9/L Final    Anisocytosis 08/27/2021 Moderate (A)  Not Present Final    Hypochromasia 08/27/2021 Moderate (A)  Not Present Final

## 2021-08-28 LAB — HEPATITIS B SURFACE ANTIBODY
HEPATITIS B SURFACE ANTIBODY QUANT: <8 m[IU]/mL (ref <8.00)
HEPATITIS B SURFACE ANTIBODY: NONREACTIVE

## 2021-08-28 LAB — HEPATITIS B SURFACE ANTIGEN: HEPATITIS B SURFACE ANTIGEN: NONREACTIVE

## 2021-08-28 LAB — HEPATITIS B CORE ANTIBODY, TOTAL: HEPATITIS B CORE TOTAL ANTIBODY: NONREACTIVE

## 2021-08-28 NOTE — Unmapped (Signed)
[facility]   Medical Nutrition Therapy Consultation       Visit Type:    Initial Assessment    Referral Reason:   Poor appetite      Mark Stephenson is a 61 y.o. male seen for medical nutrition therapy for poor appetite. Pt with metastatic prostate cancer; recently started ADT. Receiving cycle 1 docetaxel today. His active problem list, medication list, allergies, family history, social history, notes from last a few encounters, and lab results were reviewed.     Past Medical History:   Diagnosis Date    Hypertension     Seizures (CMS-HCC)      Anthropometrics   Height: 175.3 cm  Current Weight: 69.4 kg  BMI: 22.58 kg/m2  Usual Body Weight: ~ 160 lb (72.7 kg) per pt  Ideal Body Weight: 72.7 kg ([employer] Method)  % Ideal Body Weight: 95%  Recent wt change: 4.4 kg wt gain in 1 month  % wt change: 6.8% wt gain in 1 month    Weight history:  Wt Readings from Last 6 Encounters:   08/27/21 69.4 kg (153 lb)   08/27/21 69.4 kg (153 lb)   07/30/21 65 kg (143 lb 4.8 oz)   06/04/21 70.9 kg (156 lb 3.2 oz)   05/29/21 72.1 kg (159 lb)   04/26/21 74.4 kg (164 lb)       Nutrition Risk Screening:   Food Insecurity: Not on file      Physical Activity:  Physical activity level is light with some exercise.     ASPEN/AND Malnutrition Screening:   Nutrition-Focused Physical Exam not indicated due to lack of malnutrition risk factors.      Biochemical Data, Medical Tests and Procedures:  All pertinent labs and imaging reviewed by Howie Ill at 11:00 AM 08/27/2021.    Medications and Vitamin/Mineral Supplementation:   All nutritionally pertinent medications reviewed on 08/27/2021.   He is taking nutrition supplements: calcium carbonate 800 mg, vitamin D 1200 International Units    Current Outpatient Medications   Medication Sig Dispense Refill    amLODIPine (NORVASC) 10 MG tablet Take 1 tablet (10 mg total) by mouth daily.      buPROPion (WELLBUTRIN SR) 150 MG 12 hr tablet Take 1 tablet (150 mg total) by mouth.      calcium carbonate-vitamin D3 600 mg-20 mcg (800 unit) Tab Take 1 tablet by mouth two (2) times a day. 60 tablet 11    darolutamide (NUBEQA) 300 mg tablet Take 2 tablets (600 mg total) by mouth in the morning and 2 tablets (600 mg total) in the evening. Take with food. Swallow tablets whole. 120 tablet 11    dexAMETHasone (DECADRON) 4 MG tablet Take 8 mg (2 tablets) 12 hours before and 3 hours before each chemotherapy treatment. 12 tablet 2    hydroCHLOROthiazide (HYDRODIURIL) 25 MG tablet       pantoprazole (PROTONIX) 40 MG tablet Take 1 tablet (40 mg total) by mouth.      pravastatin (PRAVACHOL) 20 MG tablet       prochlorperazine (COMPAZINE) 10 MG tablet Take 1 tablet (10 mg total) by mouth every six (6) hours as needed for nausea. 30 tablet 2    tamsulosin (FLOMAX) 0.4 mg capsule        No current facility-administered medications for this visit.       Nutrition History:     Dietary Restrictions: No known food allergies or food intolerances.     Gastrointestinal  Issues: Denied issues    Oral Issues: Denied issues.    Hunger and Satiety: Denied issues.     Food Safety and Access: He did not report issues.       24-Hr Diet Recall:   Breakfast: eggs X 2, sausage with jelly  Lunch/Dinner: fried chicken, hot dogs X 2, lima beans, baked beans    Snack(s): occasionally may have oatmeal creme pie or fruit such as apple or orange  Beverages: ~ 36 oz sodas, 1-2 large glasses gingerale, some sweet tea and water   Alcohol: none reported   Supplements: Ensure Plus X 2 daily      Nutrition Assessment       ***      Daily Estimated Nutritional Needs:  Energy: 28-32 kcal/kg (actual wt)  Protein: 1.2-1.5 g/kg (actual wt)  Carbohydrate: no restriction  Fluid: 1 ml/kcal    Nutrition Goals & Evaluation      - Prevent >= 1% wt loss per week.  (New)  - Meet >= 85% estimated nutrition needs.  (New)  - Adequate hydration.  (New)  ***  ({MNT Goal Progress:77446})  ***  ({MNT Goal Progress:77446})    Nutrition goals reviewed, and relevant barriers identified and addressed: none evident. He is evaluated to have excellent willingness and ability to achieve nutrition goals.     Nutrition Intervention      - Nutrition Education: prostate cancer survivorship diet/eating style. Education resources provided include: handouts promoting nutrition intervention , lists of foods recommended and foods not recommended, and contact information   - Nutrition Counseling: Goal setting  - Oral Nutrition Supplementation: Ensure Plus or equivalent    Nutrition Plan:   ***      Materials Provided: High Calorie/High Protein Foods and Snacks, Nausea and Vomiting, Survivorship Nutrition      Follow up will occur in 1 month, or as needed.       Food/Nutrition-related history, Anthropometric measurements, Biochemical data, medical tests, procedures, Nutrition-focused physical findings, Patient understanding or compliance with intervention and recommendations , and Effectiveness of nutrition interventions will be assessed at time of follow-up.     Recommendations for Clinical Team, Care Team , and Referring Provider :  - Encourage pt to follow nutrition plan as noted above.       Time spent 16 minutes assessed at time of follow-up.     Recommendations for Clinical Team, Care Team , and Referring Provider :  - Encourage pt to follow nutrition plan as noted above.       Time spent 16 minutes

## 2021-08-29 NOTE — Unmapped (Signed)
Patient Mark Stephenson was contacted today regarding appts. Voicemail was left for patient with information to call back.  09/17/2021 Labs/RTC/Infusion - left vm with pt about appts date/times and number to call back for any questions.       Sheena

## 2021-09-17 ENCOUNTER — Other Ambulatory Visit: Admit: 2021-09-17 | Discharge: 2021-09-18 | Payer: PRIVATE HEALTH INSURANCE

## 2021-09-17 ENCOUNTER — Ambulatory Visit: Admit: 2021-09-17 | Discharge: 2021-09-18 | Payer: PRIVATE HEALTH INSURANCE

## 2021-09-17 ENCOUNTER — Ambulatory Visit
Admit: 2021-09-17 | Discharge: 2021-09-18 | Payer: PRIVATE HEALTH INSURANCE | Attending: Nurse Practitioner | Primary: Nurse Practitioner

## 2021-09-17 DIAGNOSIS — C61 Malignant neoplasm of prostate: Principal | ICD-10-CM

## 2021-09-17 DIAGNOSIS — C775 Secondary and unspecified malignant neoplasm of intrapelvic lymph nodes: Principal | ICD-10-CM

## 2021-09-17 DIAGNOSIS — C772 Secondary and unspecified malignant neoplasm of intra-abdominal lymph nodes: Principal | ICD-10-CM

## 2021-09-17 LAB — CBC W/ AUTO DIFF
BASOPHILS ABSOLUTE COUNT: 0.1 10*9/L (ref 0.0–0.1)
BASOPHILS RELATIVE PERCENT: 0.8 %
EOSINOPHILS ABSOLUTE COUNT: 0 10*9/L (ref 0.0–0.5)
EOSINOPHILS RELATIVE PERCENT: 0.2 %
HEMATOCRIT: 32.1 % — ABNORMAL LOW (ref 39.0–48.0)
HEMOGLOBIN: 10.4 g/dL — ABNORMAL LOW (ref 12.9–16.5)
LYMPHOCYTES ABSOLUTE COUNT: 2.1 10*9/L (ref 1.1–3.6)
LYMPHOCYTES RELATIVE PERCENT: 31.4 %
MEAN CORPUSCULAR HEMOGLOBIN CONC: 32.4 g/dL (ref 32.0–36.0)
MEAN CORPUSCULAR HEMOGLOBIN: 25.6 pg — ABNORMAL LOW (ref 25.9–32.4)
MEAN CORPUSCULAR VOLUME: 79.1 fL (ref 77.6–95.7)
MEAN PLATELET VOLUME: 6.6 fL — ABNORMAL LOW (ref 6.8–10.7)
MONOCYTES ABSOLUTE COUNT: 0.7 10*9/L (ref 0.3–0.8)
MONOCYTES RELATIVE PERCENT: 10.7 %
NEUTROPHILS ABSOLUTE COUNT: 3.8 10*9/L (ref 1.8–7.8)
NEUTROPHILS RELATIVE PERCENT: 56.9 %
PLATELET COUNT: 553 10*9/L — ABNORMAL HIGH (ref 150–450)
RED BLOOD CELL COUNT: 4.05 10*12/L — ABNORMAL LOW (ref 4.26–5.60)
RED CELL DISTRIBUTION WIDTH: 21 % — ABNORMAL HIGH (ref 12.2–15.2)
WBC ADJUSTED: 6.7 10*9/L (ref 3.6–11.2)

## 2021-09-17 LAB — COMPREHENSIVE METABOLIC PANEL
ALBUMIN: 3.5 g/dL (ref 3.4–5.0)
ALKALINE PHOSPHATASE: 262 U/L — ABNORMAL HIGH (ref 46–116)
ALT (SGPT): 8 U/L — ABNORMAL LOW (ref 10–49)
ANION GAP: 6 mmol/L (ref 5–14)
AST (SGOT): 18 U/L (ref <=34)
BILIRUBIN TOTAL: 0.7 mg/dL (ref 0.3–1.2)
BLOOD UREA NITROGEN: 7 mg/dL — ABNORMAL LOW (ref 9–23)
BUN / CREAT RATIO: 9
CALCIUM: 9.5 mg/dL (ref 8.7–10.4)
CHLORIDE: 105 mmol/L (ref 98–107)
CO2: 28 mmol/L (ref 20.0–31.0)
CREATININE: 0.76 mg/dL
EGFR CKD-EPI (2021) MALE: >90 mL/min/{1.73_m2} (ref >=60)
GLUCOSE RANDOM: 138 mg/dL (ref 70–179)
POTASSIUM: 4.7 mmol/L (ref 3.5–5.1)
PROTEIN TOTAL: 7.6 g/dL (ref 5.7–8.2)
SODIUM: 139 mmol/L (ref 135–145)

## 2021-09-17 LAB — TESTOSTERONE: TESTOSTERONE TOTAL: 24 ng/dL — ABNORMAL LOW

## 2021-09-17 LAB — PSA: PROSTATE SPECIFIC ANTIGEN: 21.05 ng/mL — ABNORMAL HIGH (ref 0.00–4.00)

## 2021-09-17 MED ADMIN — sodium chloride 0.9% (NS) bolus 1,000 mL: 1000 mL | INTRAVENOUS | @ 19:00:00

## 2021-09-17 MED ADMIN — famotidine (PF) (PEPCID) injection 20 mg: 20 mg | INTRAVENOUS | @ 19:00:00

## 2021-09-17 MED ADMIN — diphenhydrAMINE (BENADRYL) injection 25 mg: 25 mg | INTRAVENOUS | @ 19:00:00

## 2021-09-17 MED ADMIN — methylPREDNISolone sodium succinate (PF) (SOLU-medrol) injection 125 mg: 125 mg | INTRAVENOUS | @ 19:00:00

## 2021-09-17 MED ADMIN — dexAMETHasone (DECADRON) tablet 8 mg: 8 mg | ORAL | @ 17:00:00 | Stop: 2021-09-17

## 2021-09-17 MED ADMIN — DOCEtaxel (TAXOTERE) 133.5 mg in sodium chloride NON-PVC (NS) 0.9 % 250 mL IVPB: 75 mg/m2 | INTRAVENOUS | @ 18:00:00 | Stop: 2021-09-17

## 2021-09-17 MED ADMIN — sodium chloride (NS) 0.9 % infusion: 100 mL/h | INTRAVENOUS | @ 17:00:00

## 2021-09-17 NOTE — Unmapped (Signed)
Infusion Progress Note    Patient Name: Mark Stephenson  Patient Age: 61 y.o.  Encounter Date: 09/17/2021  No Known Allergies    Reason for visit  Infusion Center visit   Today is cycle 2 / day 1 of OP Prostate Docetaxel/prednisone   Chief Complaint: hypersensitivity reaction    I spent at least 25 minutes with this patient: assessing, performing physical exam with > 50% of time spent in counseling.     Assessment/Plan:  Hypersensitivity reaction Grade 2   -premedications:  Dexamethasone 8 mg   -symptoms: chest heaviness, a heat flush sensation all over, hypertension  -amount infused: 20.6 ml   -reaction medications: benadryl 25 mg, Pepcid 20 mg, solumedrol 125 mg     -disposition:   - we re-challenged docetaxel infusion after symptoms resolution at 1/2 rate for 30 minutes and the titrated to full rate and Mark Stephenson tolerate well without any further issues.   - he was symptoms free and stable at discharge.   - Dr. Hyacinth Meeker notified via in-basket.     Interval History/HPI:   Mark Stephenson is a 61 y.o. male patient with prostate cancer. During docetaxel infusion he report a weird flutter feeling in his chest and chest heaviness. He also report a heat flush like sensation all over. He denies shortness of breath or throat closing sensation. He denies back or abdominal pain.     On exam he is alert and oriented and in no acute distress. He is speaking clearly in full sentences. No rash noted. Lungs are clear to ascultation bilaterally. Vital signs are notable for mild hypertension from baseline.  He is Afebrile. Infusion stopped by primary RN and emergency medication administered per protocol as noted above.        Review of Systems:  A complete review of systems was obtained including: Constitutional, Eyes, ENT, Cardiovascular, Respiratory, GI, GU, Musculoskeletal, Skin, Neurological, Psychiatric, Endocrine, Heme/Lymphatic, and Allergic/Immunologic systems. It is negative or non-contributory to the patient???s management except for positives mentioned in HPI.     Physical Exam:  Vital Signs: Reviewed       Physical Exam  Constitutional:       Appearance: Normal appearance.   HENT:      Mouth/Throat:      Mouth: Mucous membranes are moist.      Pharynx: No oropharyngeal exudate or posterior oropharyngeal erythema.   Cardiovascular:      Rate and Rhythm: Normal rate and regular rhythm.   Pulmonary:      Effort: Pulmonary effort is normal.      Breath sounds: Normal breath sounds.   Abdominal:      General: Bowel sounds are normal.   Musculoskeletal:         General: Normal range of motion.   Skin:     General: Skin is warm and dry.      Coloration: Skin is not pale.      Findings: No erythema or rash.   Neurological:      General: No focal deficit present.      Mental Status: He is alert and oriented to person, place, and time.   Psychiatric:         Mood and Affect: Mood normal.         Behavior: Behavior normal.       Results:  Lab on 09/17/2021   Component Date Value Ref Range Status    Sodium 09/17/2021 139  135 - 145 mmol/L Final  Potassium 09/17/2021 4.7  3.5 - 5.1 mmol/L Final    Chloride 09/17/2021 105  98 - 107 mmol/L Final    CO2 09/17/2021 28.0  20.0 - 31.0 mmol/L Final    Anion Gap 09/17/2021 6  5 - 14 mmol/L Final    BUN 09/17/2021 7 (L)  9 - 23 mg/dL Final    Creatinine 16/02/9603 0.76  0.60 - 1.10 mg/dL Final    BUN/Creatinine Ratio 09/17/2021 9   Final    eGFR CKD-EPI (2021) Male 09/17/2021 >90  >=60 mL/min/1.80m2 Final    eGFR calculated with CKD-EPI 2021 equation in accordance with [employer] and [employer] of Nephrology Task Force recommendations.    Glucose 09/17/2021 138  70 - 179 mg/dL Final    Calcium 54/01/8118 9.5  8.7 - 10.4 mg/dL Final    Albumin 14/78/2956 3.5  3.4 - 5.0 g/dL Final    Total Protein 09/17/2021 7.6  5.7 - 8.2 g/dL Final    Total Bilirubin 09/17/2021 0.7  0.3 - 1.2 mg/dL Final    AST 21/30/8657 18  <=34 U/L Final    ALT 09/17/2021 8 (L)  10 - 49 U/L Final Alkaline Phosphatase 09/17/2021 262 (H)  46 - 116 U/L Final    PSA 09/17/2021 21.05 (H)  0.00 - 4.00 ng/mL Final    Testosterone 09/17/2021 24 (L)  188 - 684 ng/dL Final    WBC 84/69/6295 6.7  3.6 - 11.2 10*9/L Final    RBC 09/17/2021 4.05 (L)  4.26 - 5.60 10*12/L Final    HGB 09/17/2021 10.4 (L)  12.9 - 16.5 g/dL Final    HCT 28/41/3244 32.1 (L)  39.0 - 48.0 % Final    MCV 09/17/2021 79.1  77.6 - 95.7 fL Final    MCH 09/17/2021 25.6 (L)  25.9 - 32.4 pg Final    MCHC 09/17/2021 32.4  32.0 - 36.0 g/dL Final    RDW 05/08/7251 21.0 (H)  12.2 - 15.2 % Final    MPV 09/17/2021 6.6 (L)  6.8 - 10.7 fL Final    Platelet 09/17/2021 553 (H)  150 - 450 10*9/L Final    Neutrophils % 09/17/2021 56.9  % Final    Lymphocytes % 09/17/2021 31.4  % Final    Monocytes % 09/17/2021 10.7  % Final    Eosinophils % 09/17/2021 0.2  % Final    Basophils % 09/17/2021 0.8  % Final    Absolute Neutrophils 09/17/2021 3.8  1.8 - 7.8 10*9/L Final    Absolute Lymphocytes 09/17/2021 2.1  1.1 - 3.6 10*9/L Final    Absolute Monocytes 09/17/2021 0.7  0.3 - 0.8 10*9/L Final    Absolute Eosinophils 09/17/2021 0.0  0.0 - 0.5 10*9/L Final    Absolute Basophils 09/17/2021 0.1  0.0 - 0.1 10*9/L Final    Microcytosis 09/17/2021 Slight (A)  Not Present Final    Anisocytosis 09/17/2021 Moderate (A)  Not Present Final    Hypochromasia 09/17/2021 Moderate (A)  Not Present Final       Kennon Portela, AGNP-BC  [facility]

## 2021-09-17 NOTE — Unmapped (Signed)
Patient reacted to docetaxel after 20.2mls had infused. The infusion was stopped, VS were taken, and the APP was called chairside. The patient was given IV benadryl, pepcis, and solu-medrol with NS running to gravity. When symptoms return to baseline he will be re-challenged at half the start rate. Patient tolerated re-challenge well and was able to increase rate back to original order.   He completed the infusion without further incident. His PIV was flushed and removed. He was given an AVS and discharged.

## 2021-09-17 NOTE — Unmapped (Unsigned)
PIV placed.  Labs drawn & sent for analysis. To next appt.  Care provided by Daniel Maxwell RN.

## 2021-09-17 NOTE — Unmapped (Signed)
Lab on 09/17/2021   Component Date Value Ref Range Status    Sodium 09/17/2021 139  135 - 145 mmol/L Final    Potassium 09/17/2021 4.7  3.5 - 5.1 mmol/L Final    Chloride 09/17/2021 105  98 - 107 mmol/L Final    CO2 09/17/2021 28.0  20.0 - 31.0 mmol/L Final    Anion Gap 09/17/2021 6  5 - 14 mmol/L Final    BUN 09/17/2021 7 (L)  9 - 23 mg/dL Final    Creatinine 16/02/9603 0.76  0.60 - 1.10 mg/dL Final    BUN/Creatinine Ratio 09/17/2021 9   Final    eGFR CKD-EPI (2021) Male 09/17/2021 >90  >=60 mL/min/1.33m2 Final    eGFR calculated with CKD-EPI 2021 equation in accordance with [employer] and [employer] of Nephrology Task Force recommendations.    Glucose 09/17/2021 138  70 - 179 mg/dL Final    Calcium 54/01/8118 9.5  8.7 - 10.4 mg/dL Final    Albumin 14/78/2956 3.5  3.4 - 5.0 g/dL Final    Total Protein 09/17/2021 7.6  5.7 - 8.2 g/dL Final    Total Bilirubin 09/17/2021 0.7  0.3 - 1.2 mg/dL Final    AST 21/30/8657 18  <=34 U/L Final    ALT 09/17/2021 8 (L)  10 - 49 U/L Final    Alkaline Phosphatase 09/17/2021 262 (H)  46 - 116 U/L Final    PSA 09/17/2021 21.05 (H)  0.00 - 4.00 ng/mL Final    Testosterone 09/17/2021 24 (L)  188 - 684 ng/dL Final    WBC 84/69/6295 6.7  3.6 - 11.2 10*9/L Final    RBC 09/17/2021 4.05 (L)  4.26 - 5.60 10*12/L Final    HGB 09/17/2021 10.4 (L)  12.9 - 16.5 g/dL Final    HCT 28/41/3244 32.1 (L)  39.0 - 48.0 % Final    MCV 09/17/2021 79.1  77.6 - 95.7 fL Final    MCH 09/17/2021 25.6 (L)  25.9 - 32.4 pg Final    MCHC 09/17/2021 32.4  32.0 - 36.0 g/dL Final    RDW 05/08/7251 21.0 (H)  12.2 - 15.2 % Final    MPV 09/17/2021 6.6 (L)  6.8 - 10.7 fL Final    Platelet 09/17/2021 553 (H)  150 - 450 10*9/L Final    Neutrophils % 09/17/2021 56.9  % Final    Lymphocytes % 09/17/2021 31.4  % Final    Monocytes % 09/17/2021 10.7  % Final    Eosinophils % 09/17/2021 0.2  % Final    Basophils % 09/17/2021 0.8  % Final    Absolute Neutrophils 09/17/2021 3.8  1.8 - 7.8 10*9/L Final Absolute Lymphocytes 09/17/2021 2.1  1.1 - 3.6 10*9/L Final    Absolute Monocytes 09/17/2021 0.7  0.3 - 0.8 10*9/L Final    Absolute Eosinophils 09/17/2021 0.0  0.0 - 0.5 10*9/L Final    Absolute Basophils 09/17/2021 0.1  0.0 - 0.1 10*9/L Final    Microcytosis 09/17/2021 Slight (A)  Not Present Final    Anisocytosis 09/17/2021 Moderate (A)  Not Present Final    Hypochromasia 09/17/2021 Moderate (A)  Not Present Final

## 2021-09-17 NOTE — Unmapped (Signed)
Please remember to exercise daily and eat a heart-healthy diet.    For bone health while on hormone therapy, please take 800mg of calcium and 1200 international units of Vitamin D daily.    For questions/concerns please call the nurse triage line [phone] on Monday-Friday 8am-5pm.  On nights, weekends, and holidays, please call [phone].    Espiridion Supinski C. Levorn Oleski, MD, PhD  Assistant Professor of Medicine, Division of Oncology   [facility]  Fax: [phone]

## 2021-09-17 NOTE — Unmapped (Signed)
[facility] Visit Note    Patient Name: Mark Stephenson  Patient Age: 61 y.o.  Encounter Date: 09/17/2021  Attending Provider:  Laurette Schimke. 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 M with HTN, chronic pancreatitis and high volume mHSPC who will be treated per the ARASENS trial. He tolerated cycle 1 well, with no worsening of numbness in his feet.    For Docetaxel: Administration, and schedule were reviewed with the patient. Side effects of docetaxel discussed included but were not limited to:  Nausea/vomiting, complications of myelosuppression, alopecia, flu-like symptoms, fatigue, taste changes, mucositis, edema, rash, and peripheral neuropathy.  We discussed techniques to manage some of these toxicities including but not limited to: anti-emetics for N/V, daily salt rinses for mucositis prevention, and low/moderate physical activity to help with fatigue. Patient understands and verbally consents to proceed with chemotherapy infusion today.     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  - local urologist to replace foley every 4 weeks  - hold off on TURP to see if ADT + chemotherapy can shrink prostate, would give voiding trial in about 1 month now that he has started ADT    3) Bone health  - DEXA scan with osteopenia (Frax 2.3%), repeat in Feb 2024  - continue calcium 800mg  daily + vitamin D 1200 international units daily    4) Metabolic health  - recommended daily exercise and heart-healthy diet for metabolic health while on ADT  - nutrition following    Reason for Visit  The patient is seen in consultation at the request of Ramonita Lab* for evaluation of prostate cancer.    History of Present Illness:  Oncology History Overview Note   03/19/21 - CT A/P with contrast performed to follow up on pancreatic mass, with near resolution of pancreatic uncinate process. Bulky multistation abdominopelvic lymphadenopathy identified (e.g. left para-aortic node measuring 2.1cm and right common iliac node measuring 1.4cm). No concerning osseous lesions identified. Enlarged, heterogeneously enhancing prostate gland.  04/17/21 - PSA 48.83  05/29/21 - retroperitoneal lymph node core needle biopsy, pathology consistent with metastatic prostate adenocarcinoma.  06/27/21 - PSMA-PET scan with widely metastatic osseous disease as well as avid adenopathy extending from the pelvis to the left supraclavicular region consistent with metastatic prostate cancer.  Noting that there is one single enlarged retroperitoneal node with low/minimal tracer avidity measuring up to 4 cm.  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) 06/04/2021 -  Cancer Staged    Staging form: Prostate, AJCC 8th Edition  - Clinical: Stage IVB (cT1c, cN1, cM1a, PSA: 37) - Signed by Davy Pique, MD on 06/04/2021           08/26/2021 -  Chemotherapy    OP PROSTATE DOCETAXEL/PREDNISONE  Docetaxel 75 mg/m2 evry 21 days       08/27/2021 Endocrine/Hormone Therapy    OP PROSTATE LEUPROLIDE (ELIGARD) 45 MG EVERY 6 MONTHS  Plan Provider: Davy Pique, MD       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 No fevers, chills, worsening numbness/tingling, nail/skin changes. No swelling in LE.  Took docetaxel last night and this morning. No other complaints today. Anxious to get his foley catheter removed when possible.    Allergies:  No Known Allergies    Current Medications:    Current Outpatient Medications:     amLODIPine (NORVASC) 10 MG tablet, Take 1 tablet (10 mg total) by mouth daily., Disp: , Rfl:     buPROPion (WELLBUTRIN SR) 150 MG 12 hr tablet, Take 1 tablet (150 mg total) by mouth., Disp: , Rfl:     calcium carbonate-vitamin D3 600 mg-20 mcg (800 unit) Tab, Take 1 tablet by mouth two (2) times a day., Disp: 60 tablet, Rfl: 11    darolutamide (NUBEQA) 300 mg tablet, Take 2 tablets (600 mg total) by mouth in the morning and 2 tablets (600 mg total) in the evening. Take with food. Swallow tablets whole., Disp: 120 tablet, Rfl: 11    dexAMETHasone (DECADRON) 4 MG tablet, Take 8 mg (2 tablets) 12 hours before and 3 hours before each chemotherapy treatment., Disp: 12 tablet, Rfl: 2    hydroCHLOROthiazide (HYDRODIURIL) 25 MG tablet, , Disp: , Rfl:     pantoprazole (PROTONIX) 40 MG tablet, Take 1 tablet (40 mg total) by mouth., Disp: , Rfl:     pravastatin (PRAVACHOL) 20 MG tablet, , Disp: , Rfl:     prochlorperazine (COMPAZINE) 10 MG tablet, Take 1 tablet (10 mg total) by mouth every six (6) hours as needed for nausea., Disp: 30 tablet, Rfl: 2    tamsulosin (FLOMAX) 0.4 mg capsule, , Disp: , Rfl:     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)        Past Surgical History:   Procedure Laterality Date  CHG X-RAY FOR BILE DUCT ENDOSCOPY  12/08/2019    Procedure: ENDOSCOPIC CATHETERIZATION OF THE BILIARY DUCTAL SYSTEM, RADIOLOGICAL SUPERVISION AND INTERPRETATION;  Surgeon: Chriss Driver, MD;  [facility];  Service: Gastroenterology    PR ERCP,SPHINCTEROTOMY N/A 12/08/2019    Procedure: ERCP; W/SPHINCTEROTOMY/PAPILLOTOMY;  Surgeon: Chriss Driver, MD;  [facility];  Service: Gastroenterology    PR UPGI ENDOSCOPY W/US FN BX N/A 12/08/2019    Procedure: UGI W/ TRANSENDOSCOPIC ULTRASOUND GUIDED INTRAMURAL/TRANSMURAL FINE NEEDLE ASPIRATION/BIOPSY(S), ESOPHAGUS;  Surgeon: Chriss Driver, MD;  Location[facility];  Service: Gastroenterology        Social History     Occupational History    Occupation: disability   Tobacco Use    Smoking status: Every Day    Smokeless tobacco: Never    Tobacco comments:     1 pack per week. reports gradually.    Vaping Use    Vaping Use: Never used   Substance and Sexual Activity    Alcohol use: Yes     Alcohol/week: 21.0 standard drinks     Types: 21 Cans of beer per week     Comment: 3x 40-once cans of beer per day    Drug use: No    Sexual activity: Not on file       Family History  Family History   Problem Relation Age of Onset    Cancer Father     Cancer Brother     Anesthesia problems Neg Hx      Prostate Cancer Family History Assessment:  History of cancer in children (yes/no; if yes, what type AND age of diagnosis): no children  Total number of siblings (including deceased): 4 brothers, 2 sisters  History of cancer in siblings (yes/no; if yes, provide relation, type of cancer, AND age of diagnosis): oldest brother had unknown type of cancer  History of cancer in parents (yes/no; if yes, please specify parent, type of cancer, AND age of diagnosis): mother - unknown, father passed from unknown cancer  History of cancer in aunts/uncles/grandparents (yes/no; if yes, provide relation, type of cancer, AND age of diagnosis): none known      Review of Systems:  A comprehensive review of 10 systems was negative except for pertinent positives noted in HPI.    Physical Exam:  VITAL SIGNS:  BP 139/75  - Pulse 78  - Temp 36.7 ??C (98 ??F) (Temporal)  - Resp 16  - Ht 175.3 cm (5' 9)  - Wt 69.5 kg (153 lb 3.2 oz)  - SpO2 100%  - BMI 22.62 kg/m??   ECOG Performance Status: 0  GENERAL: Well-developed, well-nourished patient in no acute distress.  HEAD: Normocephalic and atraumatic.  EYES: Conjunctivae are normal. No scleral icterus.  MOUTH/THROAT: Oropharynx is clear and moist.  No mucosal lesions. Poor dentition, with tooth on the bottom right decaying.  NECK: Supple, no thyromegaly.  CARDIOVASCULAR: Normal rate, regular rhythm and normal heart sounds.  Exam reveals no gallop and no friction rub.  No murmur heard.  PULMONARY/CHEST: Effort normal and breath sounds normal. No respiratory distress.  ABDOMINAL:  Soft. There is no distension. There is no tenderness. There is no rebound and no guarding.  MUSCULOSKELETAL: No clubbing, cyanosis, or lower extremity edema.  PSYCHIATRIC: Alert and oriented.  Normal mood and affect.  NEUROLOGIC: No focal motor deficit. Normal gait.  SKIN: Skin is warm, dry, and intact.      Results/Orders:  Lab on 09/17/2021   Component Date Value Ref Range Status    WBC 09/17/2021 6.7  3.6 - 11.2 10*9/L Final    RBC 09/17/2021 4.05 (L)  4.26 - 5.60 10*12/L Final    HGB 09/17/2021 10.4 (L)  12.9 - 16.5 g/dL Final    HCT 16/02/9603 32.1 (L)  39.0 - 48.0 % Final    MCV 09/17/2021 79.1  77.6 - 95.7 fL Final    MCH 09/17/2021 25.6 (L)  25.9 - 32.4 pg Final    MCHC 09/17/2021 32.4  32.0 - 36.0 g/dL Final    RDW 54/01/8118 21.0 (H)  12.2 - 15.2 % Final    MPV 09/17/2021 6.6 (L)  6.8 - 10.7 fL Final    Platelet 09/17/2021 553 (H)  150 - 450 10*9/L Final    Neutrophils % 09/17/2021 56.9  % Final    Lymphocytes % 09/17/2021 31.4  % Final    Monocytes % 09/17/2021 10.7  % Final    Eosinophils % 09/17/2021 0.2  % Final    Basophils % 09/17/2021 0.8  % Final    Absolute Neutrophils 09/17/2021 3.8  1.8 - 7.8 10*9/L Final    Absolute Lymphocytes 09/17/2021 2.1  1.1 - 3.6 10*9/L Final    Absolute Monocytes 09/17/2021 0.7  0.3 - 0.8 10*9/L Final    Absolute Eosinophils 09/17/2021 0.0  0.0 - 0.5 10*9/L Final    Absolute Basophils 09/17/2021 0.1  0.0 - 0.1 10*9/L Final    Microcytosis 09/17/2021 Slight (A)  Not Present Final    Anisocytosis 09/17/2021 Moderate (A)  Not Present Final    Hypochromasia 09/17/2021 Moderate (A)  Not Present Final       Lab Results   Component Value Date    PSA 64.37 (H) 08/27/2021    PSA 406.39 (H) 07/30/2021    PSA 48.83 (H) 04/17/2021       Orders placed or performed in visit on 09/17/21    CBC and differential    Comprehensive Metabolic Panel    PSA, Diagnostic    CBC and differential    Comprehensive Metabolic Panel    PSA, Diagnostic    Clinic Appointment Request MD/APP Alternating    Infusion Appointment Request    LAB Appointment Request    Infusion Appointment Request    LAB Appointment Request    Clinic Appointment Request APP

## 2021-09-18 NOTE — Unmapped (Signed)
I spoke with patient Mark Stephenson to confirm appointments on the following date(s): 6/5    Christina L Good

## 2021-10-01 ENCOUNTER — Emergency Department
Admission: EM | Admit: 2021-10-01 | Discharge: 2021-10-01 | Disposition: A | Discharge status: Home / Self Care | Payer: Medicaid Other | Attending: Emergency Medicine | Admitting: Emergency Medicine

## 2021-10-01 ENCOUNTER — Encounter: Payer: Self-pay | Admitting: Emergency Medicine

## 2021-10-01 ENCOUNTER — Other Ambulatory Visit: Payer: Self-pay

## 2021-10-01 DIAGNOSIS — N390 Urinary tract infection, site not specified: Secondary | ICD-10-CM | POA: Insufficient documentation

## 2021-10-01 DIAGNOSIS — T839XXA Unspecified complication of genitourinary prosthetic device, implant and graft, initial encounter: Secondary | ICD-10-CM

## 2021-10-01 DIAGNOSIS — T83031A Leakage of indwelling urethral catheter, initial encounter: Secondary | ICD-10-CM | POA: Diagnosis present

## 2021-10-01 LAB — URINALYSIS, ROUTINE W REFLEX MICROSCOPIC
Bilirubin Urine: NEGATIVE
Glucose, UA: NEGATIVE mg/dL
Ketones, ur: NEGATIVE mg/dL
Nitrite: NEGATIVE
Protein, ur: NEGATIVE mg/dL
Specific Gravity, Urine: 1.002 — ABNORMAL LOW (ref 1.005–1.030)
pH: 6 (ref 5.0–8.0)

## 2021-10-01 MED ORDER — LEVOFLOXACIN 500 MG PO TABS: 500.0000 mg | ORAL_TABLET | Freq: Every day | ORAL | 0 refills | Status: AC

## 2021-10-01 NOTE — ED Notes (Signed)
Leg bag changed. Pt foley has buildup at penile site, pt stating they were given no instructions for how to care or clean the foley at time of insertion.

## 2021-10-01 NOTE — ED Triage Notes (Signed)
Pt to ED via POV stating that he has had a catheter for the last 2-3 weeks. Pt states that today he noticed that his bag was leaking. Pt is cancer patient and is currently on chemo, last treatment 2 weeks ago.

## 2021-10-01 NOTE — ED Provider Notes (Signed)
[facility] Provider Note    Event Date/Time   First MD Initiated Contact with Patient 10/01/21 1432     (approximate)   History   Catheter leaking   HPI  Louis Gardner is a 61 y.o. male presents emergency department complaining of Foley buildup and bag leaking.  Patient is unsure of the last time the Foley catheter was changed.  He is undergoing chemotherapy for prostate cancer.  Denies fever or chills      Physical Exam   Triage Vital Signs: ED Triage Vitals  Enc Vitals Group     BP 10/01/21 1419 119/79     Pulse Rate 10/01/21 1419 85     Resp 10/01/21 1419 16     Temp 10/01/21 1419 98.3 F (36.8 C)     Temp Source 10/01/21 1419 Oral     SpO2 10/01/21 1419 99 %     Weight 10/01/21 1421 153 lb (69.4 kg)     Height 10/01/21 1421 '5\' 9"'$  (1.753 m)     Head Circumference --      Peak Flow --      Pain Score 10/01/21 1421 0     Pain Loc --      Pain Edu? --      Excl. in [place]? --     Most recent vital signs: Vitals:   10/01/21 1419  BP: 119/79  Pulse: 85  Resp: 16  Temp: 98.3 F (36.8 C)  SpO2: 99%     General: Awake, no distress.   CV:  Good peripheral perfusion. regular rate and  rhythm Resp:  Normal effort.  Abd:  No distention.   Other:  Patient smells strongly of urine, crusting noted around the Foley catheter, bag is leaking   ED Results / Procedures / Treatments   Labs (all labs ordered are listed, but only abnormal results are displayed) Labs Reviewed  URINALYSIS, ROUTINE W REFLEX MICROSCOPIC - Abnormal; Notable for the following components:      Result Value   Color, Urine STRAW (*)    APPearance HAZY (*)    Specific Gravity, Urine 1.002 (*)    Hgb urine dipstick LARGE (*)    Leukocytes,Ua MODERATE (*)    Bacteria, UA RARE (*)    All other components within normal limits  URINE CULTURE     EKG     RADIOLOGY     PROCEDURES:   Procedures   MEDICATIONS ORDERED IN ED: Medications - No data to  display   IMPRESSION / MDM / [place] / ED COURSE  I reviewed the triage vital signs and the nursing notes.                              Differential diagnosis includes, but is not limited to, Foley catheter care, UTI, sepsis  I do not feel the patient is septic.  His vitals do not indicate sepsis criteria.  Due to him being on chemotherapy along with crusting of the Foley we did a urinalysis which shows a large amount of leukocytes.  Due to this we will treat patient with a antibiotic.  He is to follow-up with his regular doctor.  Foley catheter was changed by nursing staff.  Discharged in stable condition.         FINAL CLINICAL IMPRESSION(S) / ED DIAGNOSES   Final diagnoses:  Problem with Foley catheter, initial encounter [facility])  Urinary tract  infection without hematuria, site unspecified     Rx / DC Orders   ED Discharge Orders          Ordered    levofloxacin (LEVAQUIN) 500 MG tablet  Daily        10/01/21 1511             Note:  This document was prepared using Dragon voice recognition software and may include unintentional dictation errors.    Versie Starks, PA-C 10/01/21 1515    Naaman Plummer, MD 10/01/21 [phone]

## 2021-10-01 NOTE — ED Notes (Addendum)
Lab called to add on urine culture.  ?

## 2021-10-02 NOTE — Unmapped (Signed)
[facility] has made a third and final attempt to reach this patient to refill the following medication:Nubeqa.      We have left voicemails on the following phone numbers: [phone] and have sent a text message to the following phone numbers: [phone] .    Dates contacted: 5/18,24,30  Last scheduled delivery: 08/27/21    The patient may be at risk of non-compliance with this medication. The patient should call the [facility] at [phone]  Option 4, then Option 1 (oncology) to refill medication.    Wyatt Mage Margretta Ditty Shared [facility] Specialty Technician  01/28/22

## 2021-10-03 LAB — URINE CULTURE: Culture: 30000 — AB

## 2021-10-03 NOTE — Unmapped (Signed)
Called patient to find out if he has called pharmacy back about his Darolutamide.  Patient states he has spoke with pharmacy and will be picking up medication today.

## 2021-10-08 ENCOUNTER — Ambulatory Visit: Admit: 2021-10-08 | Payer: PRIVATE HEALTH INSURANCE

## 2021-10-11 DIAGNOSIS — C61 Malignant neoplasm of prostate: Principal | ICD-10-CM

## 2021-10-11 DIAGNOSIS — C772 Secondary and unspecified malignant neoplasm of intra-abdominal lymph nodes: Principal | ICD-10-CM

## 2021-10-12 MED FILL — NUBEQA 300 MG TABLET: ORAL | 30 days supply | Qty: 120 | Fill #2

## 2021-10-12 NOTE — Unmapped (Signed)
[facility] Clinical Assessment & Refill Coordination Note    Mark Stephenson, DOB: March 23, 1961  Phone: [phone] (home)     All above HIPAA information was verified with patient.     Was a Nurse, learning disability used for this call? No    Specialty Medication(s):   Hematology/Oncology: [name]     Current Outpatient Medications   Medication Sig Dispense Refill    amLODIPine (NORVASC) 10 MG tablet Take 1 tablet (10 mg total) by mouth daily.      buPROPion (WELLBUTRIN SR) 150 MG 12 hr tablet Take 1 tablet (150 mg total) by mouth.      calcium carbonate-vitamin D3 600 mg-20 mcg (800 unit) Tab Take 1 tablet by mouth two (2) times a day. 60 tablet 11    darolutamide (NUBEQA) 300 mg tablet Take 2 tablets (600 mg total) by mouth in the morning and 2 tablets (600 mg total) in the evening. Take with food. Swallow tablets whole. 120 tablet 11    dexAMETHasone (DECADRON) 4 MG tablet Take 8 mg (2 tablets) 12 hours before and 3 hours before each chemotherapy treatment. 12 tablet 2    hydroCHLOROthiazide (HYDRODIURIL) 25 MG tablet       pantoprazole (PROTONIX) 40 MG tablet Take 1 tablet (40 mg total) by mouth.      pravastatin (PRAVACHOL) 20 MG tablet       prochlorperazine (COMPAZINE) 10 MG tablet Take 1 tablet (10 mg total) by mouth every six (6) hours as needed for nausea. 30 tablet 2    tamsulosin (FLOMAX) 0.4 mg capsule        No current facility-administered medications for this visit.        Changes to medications: Esaiah reports no changes at this time.    No Known Allergies    Changes to allergies: No    SPECIALTY MEDICATION ADHERENCE     Nubeqa 300 mg: 2 days of medicine on hand     Medication Adherence    Patient reported X missed doses in the last month: 1-2  Specialty Medication: Nubeqa 300 mg - 2 tablets twice daily  Patient is on additional specialty medications: No  Informant: patient  Confirmed plan for next specialty medication refill: delivery by pharmacy  Refills needed for supportive medications: not needed          Specialty medication(s) dose(s) confirmed: Regimen is correct and unchanged.     Are there any concerns with adherence?  Delane reports missing 1 -2 doses of medication.  [facility] last dispensed 30 day supply of medication on 08/27/21    Adherence counseling provided? Not needed    CLINICAL MANAGEMENT AND INTERVENTION      Clinical Benefit Assessment:    Do you feel the medicine is effective or helping your condition? Yes    Clinical Benefit counseling provided? Not needed    Adverse Effects Assessment:    Are you experiencing any side effects? Yes, patient reports experiencing sometimes looses appetite. Side effect counseling provided: NA - manageable at this time    Are you experiencing difficulty administering your medicine? No    Quality of Life Assessment:    Quality of Life    Rheumatology  Oncology  Dermatology  Cystic Fibrosis          How many days over the past month did your prostate cancer  keep you from your normal activities? For example, brushing your teeth or getting up in the morning. 0  Have you discussed this with your provider? Not needed    Acute Infection Status:    Acute infections noted within Epic:  No active infections  Patient reported infection: None    Therapy Appropriateness:    Is therapy appropriate and patient progressing towards therapeutic goals? Yes, therapy is appropriate and should be continued    DISEASE/MEDICATION-SPECIFIC INFORMATION      N/A    PATIENT SPECIFIC NEEDS     Does the patient have any physical, cognitive, or cultural barriers? No    Is the patient high risk? Yes, patient is taking oral chemotherapy. Appropriateness of therapy as been assessed    Does the patient require a Care Management Plan? No     SOCIAL DETERMINANTS OF HEALTH     At the [facility], we have learned that life circumstances - like trouble affording food, housing, utilities, or transportation can affect the health of many of our patients.   That is why we wanted to ask: are you currently experiencing any life circumstances that are negatively impacting your health and/or quality of life? No    Social Determinants of Psychologist, prison and probation services Strain: Not on file   Internet Connectivity: Not on file   Food Insecurity: Not on file   Tobacco Use: High Risk    Smoking Tobacco Use: Every Day    Smokeless Tobacco Use: Never    Passive Exposure: Not on file   Housing/Utilities: Not on file   Alcohol Use: Not on file   Transportation Needs: Not on file   Substance Use: Not on file   Health Literacy: Not on file   Physical Activity: Not on file   Interpersonal Safety: Not on file   Stress: Not on file   Intimate Partner Violence: Not on file   Depression: Not on file   Social Connections: Not on file       Would you be willing to receive help with any of the needs that you have identified today? Not applicable       SHIPPING     Specialty Medication(s) to be Shipped:   Hematology/Oncology: Nubeqa    Other medication(s) to be shipped: No additional medications requested for fill at this time     Changes to insurance: No    Delivery Scheduled: Yes, Expected medication delivery date: 10/13/21.     Medication will be delivered via Next Day Courier to the confirmed prescription address in [facility].    The patient will receive a drug information handout for each medication shipped and additional FDA Medication Guides as required.  Verified that patient has previously received a Conservation officer, historic buildings and a Surveyor, mining.    The patient or caregiver noted above participated in the development of this care plan and knows that they can request review of or adjustments to the care plan at any time.      All of the patient's questions and concerns have been addressed.    Breck Coons Shared [facility] Specialty Pharmacist

## 2021-10-19 NOTE — Unmapped (Signed)
Called to remind patient of his appointment on Monday.  States patient is not available at this time, however states patient is aware of his appointments on Monday and will be there.  She would like a print out of his appointments.  Informed her to ask when patient checks out of clinic for a print out.  Spoke with:  Philomena Doheny Friend [phone] HCDM (patient stated preference)

## 2021-10-22 ENCOUNTER — Ambulatory Visit: Admit: 2021-10-22 | Discharge: 2021-10-23 | Payer: PRIVATE HEALTH INSURANCE

## 2021-10-22 ENCOUNTER — Ambulatory Visit: Admit: 2021-10-22 | Discharge: 2021-10-23 | Payer: PRIVATE HEALTH INSURANCE | Attending: Family | Primary: Family

## 2021-10-22 DIAGNOSIS — C775 Secondary and unspecified malignant neoplasm of intrapelvic lymph nodes: Principal | ICD-10-CM

## 2021-10-22 DIAGNOSIS — C61 Malignant neoplasm of prostate: Principal | ICD-10-CM

## 2021-10-22 LAB — CBC W/ AUTO DIFF
BASOPHILS ABSOLUTE COUNT: 0.1 10*9/L (ref 0.0–0.1)
BASOPHILS RELATIVE PERCENT: 1.3 %
EOSINOPHILS ABSOLUTE COUNT: 0.2 10*9/L (ref 0.0–0.5)
EOSINOPHILS RELATIVE PERCENT: 3.1 %
HEMATOCRIT: 31.4 % — ABNORMAL LOW (ref 39.0–48.0)
HEMOGLOBIN: 10.1 g/dL — ABNORMAL LOW (ref 12.9–16.5)
LYMPHOCYTES ABSOLUTE COUNT: 2.3 10*9/L (ref 1.1–3.6)
LYMPHOCYTES RELATIVE PERCENT: 35.9 %
MEAN CORPUSCULAR HEMOGLOBIN CONC: 32.1 g/dL (ref 32.0–36.0)
MEAN CORPUSCULAR HEMOGLOBIN: 25.7 pg — ABNORMAL LOW (ref 25.9–32.4)
MEAN CORPUSCULAR VOLUME: 80.3 fL (ref 77.6–95.7)
MEAN PLATELET VOLUME: 7 fL (ref 6.8–10.7)
MONOCYTES ABSOLUTE COUNT: 0.9 10*9/L — ABNORMAL HIGH (ref 0.3–0.8)
MONOCYTES RELATIVE PERCENT: 13.9 %
NEUTROPHILS ABSOLUTE COUNT: 2.9 10*9/L (ref 1.8–7.8)
NEUTROPHILS RELATIVE PERCENT: 45.8 %
PLATELET COUNT: 465 10*9/L — ABNORMAL HIGH (ref 150–450)
RED BLOOD CELL COUNT: 3.91 10*12/L — ABNORMAL LOW (ref 4.26–5.60)
RED CELL DISTRIBUTION WIDTH: 20.7 % — ABNORMAL HIGH (ref 12.2–15.2)
WBC ADJUSTED: 6.3 10*9/L (ref 3.6–11.2)

## 2021-10-22 LAB — COMPREHENSIVE METABOLIC PANEL
ALBUMIN: 3.6 g/dL (ref 3.4–5.0)
ALKALINE PHOSPHATASE: 145 U/L — ABNORMAL HIGH (ref 46–116)
ALT (SGPT): <7 U/L — ABNORMAL LOW (ref 10–49)
ANION GAP: 10 mmol/L (ref 5–14)
BILIRUBIN TOTAL: 0.5 mg/dL (ref 0.3–1.2)
BLOOD UREA NITROGEN: 8 mg/dL — ABNORMAL LOW (ref 9–23)
BUN / CREAT RATIO: 11
CALCIUM: 9.3 mg/dL (ref 8.7–10.4)
CHLORIDE: 107 mmol/L (ref 98–107)
CO2: 24 mmol/L (ref 20.0–31.0)
CREATININE: 0.75 mg/dL
EGFR CKD-EPI (2021) MALE: >90 mL/min/{1.73_m2} (ref >=60)
GLUCOSE RANDOM: 81 mg/dL (ref 70–179)
PROTEIN TOTAL: 7.3 g/dL (ref 5.7–8.2)
SODIUM: 141 mmol/L (ref 135–145)

## 2021-10-22 LAB — PSA: PROSTATE SPECIFIC ANTIGEN: 4.78 ng/mL — ABNORMAL HIGH (ref 0.00–4.00)

## 2021-10-22 MED ORDER — HYDROCHLOROTHIAZIDE 25 MG TABLET
ORAL_TABLET | Freq: Every day | ORAL | 0 refills | 90 days | Status: CP
Start: 2021-10-22 — End: ?

## 2021-10-22 MED ORDER — PRAVASTATIN 20 MG TABLET
ORAL_TABLET | Freq: Every day | ORAL | 0 refills | 90 days | Status: CP
Start: 2021-10-22 — End: ?

## 2021-10-22 MED ORDER — AMLODIPINE 10 MG TABLET
ORAL_TABLET | Freq: Every day | ORAL | 0 refills | 90 days | Status: CP
Start: 2021-10-22 — End: ?

## 2021-10-22 MED ADMIN — famotidine (PF) (PEPCID) injection 20 mg: 20 mg | INTRAVENOUS | @ 22:00:00

## 2021-10-22 MED ADMIN — dexAMETHasone (DECADRON) tablet 8 mg: 8 mg | ORAL | @ 21:00:00 | Stop: 2021-10-22

## 2021-10-22 MED ADMIN — diphenhydrAMINE (BENADRYL) injection 25 mg: 25 mg | INTRAVENOUS | @ 22:00:00

## 2021-10-22 MED ADMIN — DOCEtaxel (TAXOTERE) 133.5 mg in sodium chloride NON-PVC (NS) 0.9 % 250 mL IVPB: 75 mg/m2 | INTRAVENOUS | @ 21:00:00 | Stop: 2021-10-22

## 2021-10-22 MED ADMIN — methylPREDNISolone sodium succinate (PF) (SOLU-medrol) injection 125 mg: 125 mg | INTRAVENOUS | @ 22:00:00

## 2021-10-22 MED ADMIN — sodium chloride (NS) 0.9 % infusion: 100 mL/h | INTRAVENOUS | @ 21:00:00

## 2021-10-22 NOTE — Unmapped (Signed)
RTC in 3 weeks for Cycle 4    I have renewed your blood pressure medications    We will repeat your CT scans 4-6 weeks after your last infusion; your last infusion should be 8/21

## 2021-10-22 NOTE — Unmapped (Signed)
PIV placed (left forearm) with 22 ga, IV x  1 attempt. Labs drawn. Line flushed with 0.9%NS. Saline lock left in place. Pt tolerated insertion without difficulty.

## 2021-10-22 NOTE — Unmapped (Signed)
[facility] Visit Note    Patient Name: Mark Stephenson  Patient Age: 61 y.o.  Encounter Date: 10/22/2021  Attending Provider:  Laurette Schimke. 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 M with HTN, chronic pancreatitis and high volume mHSPC who will be treated per the ARASENS trial. He tolerated cycles 1  and 2 well, with no worsening of numbness in his feet.        Recommendations  1) Prostate cancer -   Return to clinic: 3 weeks with Dr. 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  - local urologist to replace foley every 4 weeks  - hold off on TURP to see if ADT + chemotherapy can shrink prostate  - Can consider TOV at this time    3) Bone health  - DEXA scan with osteopenia (Frax 2.3%), repeat in Feb 2024  - continue calcium 800mg  daily + vitamin D 1200 international units daily    4) Metabolic health  - recommended daily exercise and heart-healthy diet for metabolic health while on ADT  - nutrition following    Reason for Visit  The patient is seen in consultation at the request of Aycock, Ngwe Achirimofo* for evaluation of prostate cancer.    History of Present Illness:  Oncology History Overview Note   03/19/21 - CT A/P with contrast performed to follow up on pancreatic mass, with near resolution of pancreatic uncinate process. Bulky multistation abdominopelvic lymphadenopathy identified (e.g. left para-aortic node measuring 2.1cm and right common iliac node measuring 1.4cm). No concerning osseous lesions identified. Enlarged, heterogeneously enhancing prostate gland.  04/17/21 - PSA 48.83  05/29/21 - retroperitoneal lymph node core needle biopsy, pathology consistent with metastatic prostate adenocarcinoma.  06/27/21 - PSMA-PET scan with widely metastatic osseous disease as well as avid adenopathy extending from the pelvis to the left supraclavicular region consistent with metastatic prostate cancer. Noting that there is one single enlarged retroperitoneal node with low/minimal tracer avidity measuring up to 4 cm.  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)     06/04/2021 -  Cancer Staged    Staging form: Prostate, AJCC 8th Edition  - Clinical: Stage IVB (cT1c, cN1, cM1a, PSA: 41) - Signed by Davy Pique, MD on 06/04/2021           08/26/2021 -  Chemotherapy    OP PROSTATE DOCETAXEL/PREDNISONE  Docetaxel 75 mg/m2 evry 21 days       08/27/2021 Endocrine/Hormone Therapy    OP PROSTATE LEUPROLIDE (ELIGARD) 45 MG EVERY 6 MONTHS  Plan Provider: Davy Pique, MD       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 No fevers, chills, worsening numbness/tingling, nail/skin changes. No swelling in LE.  Took docetaxel last night and this morning. No other complaints today. Anxious to get his foley catheter removed when possible.    Allergies:  No Known Allergies    Current Medications:    Current Outpatient Medications:     buPROPion (WELLBUTRIN SR) 150 MG 12 hr tablet, Take 1 tablet (150 mg total) by mouth., Disp: , Rfl:     calcium carbonate-vitamin D3 600 mg-20 mcg (800 unit) Tab, Take 1 tablet by mouth two (2) times a day., Disp: 60 tablet, Rfl: 11    darolutamide (NUBEQA) 300 mg tablet, Take 2 tablets (600 mg total) by mouth in the morning and 2 tablets (600 mg total) in the evening. Take with food. Swallow tablets whole., Disp: 120 tablet, Rfl: 11    dexAMETHasone (DECADRON) 4 MG tablet, Take 8 mg (2 tablets) 12 hours before and 3 hours before each chemotherapy treatment., Disp: 12 tablet, Rfl: 2    levoFLOXacin (LEVAQUIN) 500 MG tablet, , Disp: , Rfl:     pantoprazole (PROTONIX) 40 MG tablet, Take 1 tablet (40 mg total) by mouth., Disp: , Rfl:     prochlorperazine (COMPAZINE) 10 MG tablet, Take 1 tablet (10 mg total) by mouth every six (6) hours as needed for nausea., Disp: 30 tablet, Rfl: 2    tamsulosin (FLOMAX) 0.4 mg capsule, , Disp: , Rfl:     amLODIPine (NORVASC) 10 MG tablet, Take 1 tablet (10 mg total) by mouth daily., Disp: 90 tablet, Rfl: 0    hydroCHLOROthiazide (HYDRODIURIL) 25 MG tablet, Take 1 tablet (25 mg total) by mouth daily., Disp: 90 tablet, Rfl: 0    pravastatin (PRAVACHOL) 20 MG tablet, Take 1 tablet (20 mg total) by mouth daily., Disp: 90 tablet, Rfl: 0    Past Medical History and Social History  Patient Active Problem List   Diagnosis    Essential hypertension    Non-cardiac chest pain    Chronic bilateral low back pain without sciatica History of seizures    Leg weakness, bilateral    Low back pain with right-sided sciatica    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)    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)        Past Surgical History:   Procedure Laterality Date    CHG X-RAY FOR BILE DUCT ENDOSCOPY  12/08/2019    Procedure: ENDOSCOPIC CATHETERIZATION OF THE BILIARY DUCTAL SYSTEM, RADIOLOGICAL SUPERVISION AND INTERPRETATION;  Surgeon: Chriss Driver, MD;  [facility]: [facility];  Service: Gastroenterology  PR ERCP,SPHINCTEROTOMY N/A 12/08/2019    Procedure: ERCP; W/SPHINCTEROTOMY/PAPILLOTOMY;  Surgeon: Chriss Driver, MD;  [facility]: [facility];  Service: Gastroenterology    PR UPGI ENDOSCOPY W/US FN BX N/A 12/08/2019    Procedure: UGI W/ TRANSENDOSCOPIC ULTRASOUND GUIDED INTRAMURAL/TRANSMURAL FINE NEEDLE ASPIRATION/BIOPSY(S), ESOPHAGUS;  Surgeon: Chriss Driver, MD;  Location: [facility];  Service: Gastroenterology        Social History     Occupational History    Occupation: disability   Tobacco Use    Smoking status: Every Day    Smokeless tobacco: Never    Tobacco comments:     1 pack per week. reports gradually.    Vaping Use    Vaping Use: Never used   Substance and Sexual Activity    Alcohol use: Yes     Alcohol/week: 21.0 standard drinks     Types: 21 Cans of beer per week     Comment: 3x 40-once cans of beer per day    Drug use: No    Sexual activity: Not on file       Family History  Family History   Problem Relation Age of Onset    Cancer Father     Cancer Brother     Anesthesia problems Neg Hx      Prostate Cancer Family History Assessment:  History of cancer in children (yes/no; if yes, what type AND age of diagnosis): no children  Total number of siblings (including deceased): 4 brothers, 2 sisters  History of cancer in siblings (yes/no; if yes, provide relation, type of cancer, AND age of diagnosis): oldest brother had unknown type of cancer  History of cancer in parents (yes/no; if yes, please specify parent, type of cancer, AND age of diagnosis): mother - unknown, father passed from unknown cancer  History of cancer in aunts/uncles/grandparents (yes/no; if yes, provide relation, type of cancer, AND age of diagnosis): none known      Review of Systems:  A comprehensive review of 10 systems was negative except for pertinent positives noted in HPI.    Physical Exam:  VITAL SIGNS:  BP 142/75  - Pulse 73  - Temp 36.9 ??C (98.5 ??F) (Temporal)  - Resp 16  - Ht 175.3 cm (5' 9)  - SpO2 100%  - BMI 22.62 kg/m??   ECOG Performance Status: 0  GENERAL: Well-developed, well-nourished patient in no acute distress.  HEAD: Normocephalic and atraumatic.  EYES: Conjunctivae are normal. No scleral icterus.  MOUTH/THROAT: Oropharynx is clear and moist.  No mucosal lesions. Poor dentition, with tooth on the bottom right decaying.  NECK: Supple, no thyromegaly.  CARDIOVASCULAR: Normal rate, regular rhythm and normal heart sounds.  Exam reveals no gallop and no friction rub.  No murmur heard.  PULMONARY/CHEST: Effort normal and breath sounds normal. No respiratory distress.  ABDOMINAL:  Soft. There is no distension. There is no tenderness. There is no rebound and no guarding.  MUSCULOSKELETAL: No clubbing, cyanosis, or lower extremity edema.  PSYCHIATRIC: Alert and oriented.  Normal mood and affect.  NEUROLOGIC: No focal motor deficit. Normal gait.  SKIN: Skin is warm, dry, and intact.      Results/Orders:    Appointment on 10/22/2021   Component Date Value Ref Range Status    WBC 10/22/2021 6.3  3.6 - 11.2 10*9/L Final    RBC 10/22/2021 3.91 (L)  4.26 - 5.60 10*12/L Final    HGB 10/22/2021 10.1 (L)  12.9 - 16.5 g/dL Final  HCT 10/22/2021 31.4 (L)  39.0 - 48.0 % Final    MCV 10/22/2021 80.3  77.6 - 95.7 fL Final    MCH 10/22/2021 25.7 (L)  25.9 - 32.4 pg Final    MCHC 10/22/2021 32.1  32.0 - 36.0 g/dL Final    RDW 16/02/9603 20.7 (H)  12.2 - 15.2 % Final    MPV 10/22/2021 7.0  6.8 - 10.7 fL Final    Platelet 10/22/2021 465 (H)  150 - 450 10*9/L Final    Neutrophils % 10/22/2021 45.8  % Final    Lymphocytes % 10/22/2021 35.9  % Final    Monocytes % 10/22/2021 13.9  % Final    Eosinophils % 10/22/2021 3.1  % Final    Basophils % 10/22/2021 1.3  % Final    Absolute Neutrophils 10/22/2021 2.9  1.8 - 7.8 10*9/L Final    Absolute Lymphocytes 10/22/2021 2.3  1.1 - 3.6 10*9/L Final    Absolute Monocytes 10/22/2021 0.9 (H)  0.3 - 0.8 10*9/L Final    Absolute Eosinophils 10/22/2021 0.2  0.0 - 0.5 10*9/L Final    Absolute Basophils 10/22/2021 0.1  0.0 - 0.1 10*9/L Final    Anisocytosis 10/22/2021 Moderate (A)  Not Present Final    Hypochromasia 10/22/2021 Moderate (A)  Not Present Final       Lab Results   Component Value Date    PSA 21.05 (H) 09/17/2021    PSA 64.37 (H) 08/27/2021    PSA 406.39 (H) 07/30/2021    PSA 48.83 (H) 04/17/2021       Orders placed or performed in visit on 10/22/21    Infusion Appointment Request    LAB Appointment Request    Infusion Appointment Request    LAB Appointment Request

## 2021-10-22 NOTE — Unmapped (Signed)
D1C3 Docetaxel 133.5 mg initiated at 1724.   Pt received 13 mls of treatment when he c/o feeling hot, flushed, chest pain, retching. BP 182/139.   Infusion stopped. APP notified. Emergency meds given:   Benadryl 25 mg, Pepcid 20 mg, Solu-Medrol 125 mg.   Pt reports symptom relief almost immediate after emergency medications.   @1805 : Pt was observed for 30 minutes, and Infusion restarted at half ordered rate.   @1837 : Rate of infusion back to ordered rate 30 minutes after restarting infusion.   The rest of infusion completed without complications. Line care provided with positive blood return; PIV flushed, discontinued.   Pt discharged from clinic in NAD, in stable condition, accompanied by family.

## 2021-10-23 NOTE — Unmapped (Signed)
Appointment on 10/22/2021   Component Date Value Ref Range Status    Sodium 10/22/2021 141  135 - 145 mmol/L Final    Potassium 10/22/2021    Final    Hemolyzed Specimen.      Chloride 10/22/2021 107  98 - 107 mmol/L Final    CO2 10/22/2021 24.0  20.0 - 31.0 mmol/L Final    Anion Gap 10/22/2021 10  5 - 14 mmol/L Final    BUN 10/22/2021 8 (L)  9 - 23 mg/dL Final    Creatinine 16/02/9603 0.75  0.60 - 1.10 mg/dL Final    BUN/Creatinine Ratio 10/22/2021 11   Final    eGFR CKD-EPI (2021) Male 10/22/2021 >90  >=60 mL/min/1.71m2 Final    eGFR calculated with CKD-EPI 2021 equation in accordance with [employer] and [employer] of Nephrology Task Force recommendations.    Glucose 10/22/2021 81  70 - 179 mg/dL Final    Calcium 54/01/8118 9.3  8.7 - 10.4 mg/dL Final    Albumin 14/78/2956 3.6  3.4 - 5.0 g/dL Final    Total Protein 10/22/2021 7.3  5.7 - 8.2 g/dL Final    Total Bilirubin 10/22/2021 0.5  0.3 - 1.2 mg/dL Final    AST 21/30/8657    Final    Hemolyzed Specimen.      ALT 10/22/2021 <7 (L)  10 - 49 U/L Final    Alkaline Phosphatase 10/22/2021 145 (H)  46 - 116 U/L Final    PSA 10/22/2021 4.78 (H)  0.00 - 4.00 ng/mL Final    WBC 10/22/2021 6.3  3.6 - 11.2 10*9/L Final    RBC 10/22/2021 3.91 (L)  4.26 - 5.60 10*12/L Final    HGB 10/22/2021 10.1 (L)  12.9 - 16.5 g/dL Final    HCT 84/69/6295 31.4 (L)  39.0 - 48.0 % Final    MCV 10/22/2021 80.3  77.6 - 95.7 fL Final    MCH 10/22/2021 25.7 (L)  25.9 - 32.4 pg Final    MCHC 10/22/2021 32.1  32.0 - 36.0 g/dL Final    RDW 28/41/3244 20.7 (H)  12.2 - 15.2 % Final    MPV 10/22/2021 7.0  6.8 - 10.7 fL Final    Platelet 10/22/2021 465 (H)  150 - 450 10*9/L Final    Neutrophils % 10/22/2021 45.8  % Final    Lymphocytes % 10/22/2021 35.9  % Final    Monocytes % 10/22/2021 13.9  % Final    Eosinophils % 10/22/2021 3.1  % Final    Basophils % 10/22/2021 1.3  % Final    Absolute Neutrophils 10/22/2021 2.9  1.8 - 7.8 10*9/L Final    Absolute Lymphocytes 10/22/2021 2.3 1.1 - 3.6 10*9/L Final    Absolute Monocytes 10/22/2021 0.9 (H)  0.3 - 0.8 10*9/L Final    Absolute Eosinophils 10/22/2021 0.2  0.0 - 0.5 10*9/L Final    Absolute Basophils 10/22/2021 0.1  0.0 - 0.1 10*9/L Final    Anisocytosis 10/22/2021 Moderate (A)  Not Present Final    Hypochromasia 10/22/2021 Moderate (A)  Not Present Final

## 2021-10-23 NOTE — Unmapped (Signed)
Encounter addended by: Ronney Lion, ANP on: 10/23/2021 11:17 AM   Actions taken: Clinical Note Signed, Level of Service modified

## 2021-10-23 NOTE — Unmapped (Signed)
Infusion Progress Note    Patient Name: Mark Stephenson  Patient Age: 61 y.o.  Encounter Date: 10/22/2021  No Known Allergies    Reason for visit  Infusion Center visit   Today is CYCLE 3/ DAY 1 of Prostate Docetaxel/Prednisone therapy.   Chief Complaint: Diaphoresis, chest pain, N/V   Oncologist: Dr. Arlys John Miller/ Ardean Larsen, NP        Assessment/Plan:  Hypersensitivity reaction Grade 2    --Pre-medications: Dexamethasone 8 mg  --Symptoms: Diaphoresis, chest pain, N/V   --Amount Infused:13 mL  --Reaction medications: Diphenhydramine 25 mg, Famotidine 20 mg and Solumedrol 125 mg  --Primary oncology team notified of today's events via in-basket      Disposition: Okay to restart infusion 30 min after symptoms resolve at half rate for 30 minutes. In the absence of signs/symptoms suggestive of HSR okay to increase the rate of the infusion back to the ordered rate for the remainder.       Interval History/HPI:   Mark Stephenson is a 61 yo male with metastatic prostate cancer in the infusion clinic to receive C3D1 Docetaxel/Prednisone therapy. He received approximately 13 mL of Docetaxel and began to feel hot. He had some chest tightness, diaphoresis, nausea and an episode of emesis. VS notable for HTN (182/139) and tachycardia (117).     The infusion was immediately stopped. He was given Diphenhydramine 25 mg, Famotidine 20 mg and Solumedrol 125 mg per HSR protocol. Within minutes of receiving the emergency medications all symptoms resolved. At the time of my arrival he was feeling much better and had no complaints.     In review of the chart he had the same symptoms on 5/15 with Cycle 2. He was treated with antihistamines and steroids and was able to complete the remainder of the infusion at that time as well.     ROS:  All other systems reviewed were negative.      Physical Exam:  Vital Signs:  OXYGEN SAT:  Vitals:    10/22/21 1600 10/22/21 1728 10/22/21 1804 10/22/21 1835   BP:  182/139 152/82 162/81   Pulse:  117 75 77   Resp:   18 18   SpO2:  100%     Weight: 70.9 kg (156 lb 3.2 oz)          General:    Healthy-appearing, resting in no acute distress.  HEENT:   Normocephalic and atraumatic. No scleral icterus or conjunctival injection. Oral mucosa moist without ulceration, erythema, exudate or purpura.  Cardiovascular:    Regular rate, & rhythm. S1/S2 No significant murmurs or gallops.  Respiratory:   Bilateral breath sounds clear to auscultation without adventitious sounds.  No increased work of breathing noted.   Gastrointestinal:    Abdomen soft, nontender, non-distended.  Bowel sounds present in all quadrants. Umbilical hernia present.   Skin:   Grossly intact, warm/dry. No rashes. Lingering sweat noted on forehead and face.   Neuro:   Alert, oriented X4.      Results:  Recent Labs     10/22/21  1430   WBC 6.3   HGB 10.1*   HCT 31.4*   PLT 465*   NEUTROABS 2.9   LYMPHSABS 2.3   MONOSABS 0.9*   EOSABS 0.2   BASOSABS 0.1     Recent Labs     10/22/21  1430   NA 141   CL 107   CO2 24.0   BUN 8*   CREATININE 0.75   GLU 81  CALCIUM 9.3   ALBUMIN 3.6   PROT 7.3   BILITOT 0.5   ALT <7*   ALKPHOS 145*           I personally spent 35 minutes face-to-face and non-face-to-face in the care of this patient, which includes all pre, intra, and post visit time on the date of service.         Ronney Lion, ANP

## 2021-10-25 NOTE — Unmapped (Signed)
Clinical Pharmacist Brief Note:    Patient with grade 2 hypersensitivity reaction with C2 and C3 of docetaxel. Called patient to confirm whether he has been taking dexamethasone 8 mg 12 hours prior and 3 hours prior to chemotherapy to prevent infusion reaction. Patient was not aware of this medication and has not been taking it. Some data shows a single dose of dexamethasone 10-20 mg IV prior to docetaxel can also prevent hypersensitivity reactions. Given that patient has not been taking the dexamethasone, uncertainty on adherence of this schedule in the future, and patient preference to just receive premedication in clinic, will change the premedication in treatment plan to dexamethasone 20 mg IV. Will also add famotidine 20 mg PO due to history of infusion reactions for the past two treatments.     Trilby Drummer, PharmD, BCOP  Hematology/Oncology Pharmacist

## 2021-11-05 NOTE — Unmapped (Signed)
[facility] Refill Coordination Note    Specialty Medication(s) to be Shipped:   Hematology/Oncology: Mark Stephenson  ((DENIED))    Other medication(s) to be shipped: No additional medications requested for fill at this time     Mark Stephenson, DOB: 12-01-1960  Phone: [phone] (home)       All above HIPAA information was verified with patient.     Was a Nurse, learning disability used for this call? No    Completed refill call assessment today to schedule patient's medication shipment from the [facility] [phone]).  All relevant notes have been reviewed.     Specialty medication(s) and dose(s) confirmed: Regimen is correct and unchanged.   Changes to medications: Mark Stephenson reports no changes at this time.  Changes to insurance: No  New side effects reported not previously addressed with a pharmacist or physician: None reported  Questions for the pharmacist: No    Confirmed patient received a Conservation officer, historic buildings and a Surveyor, mining with first shipment. The patient will receive a drug information handout for each medication shipped and additional FDA Medication Guides as required.       DISEASE/MEDICATION-SPECIFIC INFORMATION        N/A    SPECIALTY MEDICATION ADHERENCE     Medication Adherence    Patient reported X missed doses in the last month: 0  Specialty Medication: Nubeqa 300 mg  Patient is on additional specialty medications: No  Informant: patient          Were doses missed due to medication being on hold? No    Nubeqa 300 mg: 30 days of medicine on hand       REFERRAL TO PHARMACIST     Referral to the pharmacist: Not needed      [facility]     Shipping address confirmed in Epic.     Delivery Scheduled: Patient declined refill at this time due to 30+ days left of medication.     Medication will be delivered via UPS to the prescription address in Epic [place].    Mark Stephenson Mark Stephenson   [facility] Specialty Technician

## 2021-11-06 NOTE — Unmapped (Signed)
Thank you for your e-consult to the [facility] Cancer Genetics team.    SUMMARY:   Mr. Mark Stephenson is an 61 y.o. male seen for a personal history of metastatic prostate cancer, diagnosed at age 54.  His family history is notable for his oldest brother having unknown type of cancer, and father passed from unknown cancer.    This patient met NCCN criteria for evaluation of hereditary cancer risk based on his diagnosis with metastatic prostate cancer, thus genetic testing was recommended to guide treatment and therapy decisions. Mark Stephenson test was performed at [facility] and included sequencing and deletion/duplication analysis of the following genes: BRCA1, BRCA2, ATM, BARD1, BRIP1, FANCA, CHEK2, HOXB13, MLH1, MSH2, MSH6, PMS2, EPCAM, PALB2, RAD51C, RAD51D, and TP53.     RESULT:  Normal result. Testing did not identify any genetic changes associated with cancer predisposition.     A copy of the genetic test results are available in the Media tab dated 07/30/2021.    GENETICS ASSESSMENT:  Genetic testing was normal. Together with Mark Stephenson's relatively reassuring family history, this suggests that the chance for a hereditary cancer risk syndrome is very low.  It most likely means that Mark Stephenson does not have a strong genetic factor contributing to his history of prostate cancer. We consider this normal result to be reassuring in terms of an inherited cancer risk syndrome. A detailed family history is critical in the accurate interpretation of a genetic test result.  If the family history is not well documented in the patient's medical record, the recommendations provided with the associated genetic test result cannot be completely individualized to the patient.      Family members may be at increased risk to develop various cancers, based on the family history. In particular, male first degree family members to the patient (ie: father, brothers, sons, as applicable) are at a somewhat increased risk of developing prostate simply due to having a close relative with prostate cancer.  Family members should discuss the family history and this normal genetic testing result with their health team to determine the most appropriate approach to cancer screening for them.      Changes in Mark Stephenson personal or family history might alter our assessment of the chance that his cancer or those in the family were due to an inherited cancer risk. In addition, future advances in genetic testing might lead to additional ways to assess cancer risk in patients with normal genetic testing for inherited cancer genetics syndromes. Future re-evaluation may be appropriate.      RECOMMENDATIONS:      1) We do not recommend additional genetic testing for Mark Stephenson at this time.     2) Formal Cancer Genetics consultation is not recommended but is available to the patient if they would like to meet with [place] at a future time to discuss the cancer history and possibility for an inherited cancer risk syndrome.    3) Family members may wish to discuss the family history with their providers to determine appropriate cancer screening    _________________________________________________    [facility]  [phone]        Thank you for your e-Consult.    To summarize: Mark Stephenson had negative germline genetic testing as detailed above and his family history does not support a germline cancer predisposition (although with limited documentation in the chart), thus his prostate cancer was most likely sporadic and no additional testing or consultation with genetics are recommended.  My recommendations are as follows: We do not recommend additional genetic testing or a formal genetics consultation for Mark Stephenson at this time.      I spent 5-10 minutes in medical consultative discussion and review of medical records, including a written report to the treating provider via electronic health record regarding the condition of this patient. This e-Consult did include an answerable clinical question and did not recommend a clinic visit.     Jermario Kalmar, CGC  Ronalee Belts, MD     The recommendations provided in this eConsult are based on the clinical data available to me and are furnished without the benefit of a comprehensive in-person evaluation of the patient. Any new clinical issues or changes in patient status not available to me will need to be taken into account when assessing these recommendations. The ongoing management of this patient is the responsibility of the referring clinician. Please contact me if you have further questions.

## 2021-11-08 NOTE — Unmapped (Signed)
Called patient and his friend Jola Babinski to remind them of patient's appts on Monday 11/12/21.  Patient appreciative of call.

## 2021-11-12 ENCOUNTER — Ambulatory Visit: Admit: 2021-11-12 | Discharge: 2021-11-13 | Payer: PRIVATE HEALTH INSURANCE

## 2021-11-12 ENCOUNTER — Other Ambulatory Visit: Admit: 2021-11-12 | Discharge: 2021-11-13 | Payer: PRIVATE HEALTH INSURANCE

## 2021-11-12 DIAGNOSIS — C775 Secondary and unspecified malignant neoplasm of intrapelvic lymph nodes: Principal | ICD-10-CM

## 2021-11-12 DIAGNOSIS — C61 Malignant neoplasm of prostate: Principal | ICD-10-CM

## 2021-11-12 DIAGNOSIS — C772 Secondary and unspecified malignant neoplasm of intra-abdominal lymph nodes: Principal | ICD-10-CM

## 2021-11-12 LAB — COMPREHENSIVE METABOLIC PANEL
ALBUMIN: 3.3 g/dL — ABNORMAL LOW (ref 3.4–5.0)
ALKALINE PHOSPHATASE: 109 U/L (ref 46–116)
ALT (SGPT): 7 U/L — ABNORMAL LOW (ref 10–49)
ANION GAP: 10 mmol/L (ref 5–14)
AST (SGOT): 19 U/L (ref <=34)
BILIRUBIN TOTAL: 0.5 mg/dL (ref 0.3–1.2)
BLOOD UREA NITROGEN: 7 mg/dL — ABNORMAL LOW (ref 9–23)
BUN / CREAT RATIO: 10
CALCIUM: 9.3 mg/dL (ref 8.7–10.4)
CHLORIDE: 103 mmol/L (ref 98–107)
CO2: 27 mmol/L (ref 20.0–31.0)
CREATININE: 0.73 mg/dL
EGFR CKD-EPI (2021) MALE: >90 mL/min/{1.73_m2} (ref >=60)
GLUCOSE RANDOM: 138 mg/dL (ref 70–179)
POTASSIUM: 4.5 mmol/L (ref 3.5–5.1)
PROTEIN TOTAL: 7 g/dL (ref 5.7–8.2)
SODIUM: 140 mmol/L (ref 135–145)

## 2021-11-12 LAB — CBC W/ AUTO DIFF
BASOPHILS ABSOLUTE COUNT: 0.1 10*9/L (ref 0.0–0.1)
BASOPHILS RELATIVE PERCENT: 1.2 %
EOSINOPHILS ABSOLUTE COUNT: 0 10*9/L (ref 0.0–0.5)
EOSINOPHILS RELATIVE PERCENT: 0.6 %
HEMATOCRIT: 31.8 % — ABNORMAL LOW (ref 39.0–48.0)
HEMOGLOBIN: 10.3 g/dL — ABNORMAL LOW (ref 12.9–16.5)
LYMPHOCYTES ABSOLUTE COUNT: 2.3 10*9/L (ref 1.1–3.6)
LYMPHOCYTES RELATIVE PERCENT: 34.3 %
MEAN CORPUSCULAR HEMOGLOBIN CONC: 32.4 g/dL (ref 32.0–36.0)
MEAN CORPUSCULAR HEMOGLOBIN: 25.3 pg — ABNORMAL LOW (ref 25.9–32.4)
MEAN CORPUSCULAR VOLUME: 78.3 fL (ref 77.6–95.7)
MEAN PLATELET VOLUME: 6.7 fL — ABNORMAL LOW (ref 6.8–10.7)
MONOCYTES ABSOLUTE COUNT: 0.9 10*9/L — ABNORMAL HIGH (ref 0.3–0.8)
MONOCYTES RELATIVE PERCENT: 13.1 %
NEUTROPHILS ABSOLUTE COUNT: 3.5 10*9/L (ref 1.8–7.8)
NEUTROPHILS RELATIVE PERCENT: 50.8 %
PLATELET COUNT: 609 10*9/L — ABNORMAL HIGH (ref 150–450)
RED BLOOD CELL COUNT: 4.06 10*12/L — ABNORMAL LOW (ref 4.26–5.60)
RED CELL DISTRIBUTION WIDTH: 19 % — ABNORMAL HIGH (ref 12.2–15.2)
WBC ADJUSTED: 6.8 10*9/L (ref 3.6–11.2)

## 2021-11-12 LAB — PSA: PROSTATE SPECIFIC ANTIGEN: 2.67 ng/mL (ref 0.00–4.00)

## 2021-11-12 MED ADMIN — sodium chloride (NS) 0.9 % infusion: 100 mL/h | INTRAVENOUS | @ 15:00:00

## 2021-11-12 MED ADMIN — DOCEtaxel (TAXOTERE) 133.5 mg in sodium chloride NON-PVC (NS) 0.9 % 250 mL IVPB: 75 mg/m2 | INTRAVENOUS | @ 15:00:00 | Stop: 2021-11-12

## 2021-11-12 MED ADMIN — famotidine (PEPCID) tablet 20 mg: 20 mg | ORAL | @ 15:00:00 | Stop: 2021-11-12

## 2021-11-12 MED ADMIN — dexamethasone (DECADRON) 4 mg/mL injection 20 mg: 20 mg | INTRAVENOUS | @ 15:00:00 | Stop: 2021-11-12

## 2021-11-12 NOTE — Unmapped (Signed)
Lab on 11/12/2021   Component Date Value Ref Range Status    Sodium 11/12/2021 140  135 - 145 mmol/L Final    Potassium 11/12/2021 4.5  3.5 - 5.1 mmol/L Final    Chloride 11/12/2021 103  98 - 107 mmol/L Final    CO2 11/12/2021 27.0  20.0 - 31.0 mmol/L Final    Anion Gap 11/12/2021 10  5 - 14 mmol/L Final    BUN 11/12/2021 7 (L)  9 - 23 mg/dL Final    Creatinine 21/30/8657 0.73  0.60 - 1.10 mg/dL Final    BUN/Creatinine Ratio 11/12/2021 10   Final    eGFR CKD-EPI (2021) Male 11/12/2021 >90  >=60 mL/min/1.37m2 Final    eGFR calculated with CKD-EPI 2021 equation in accordance with [employer] and [employer] of Nephrology Task Force recommendations.    Glucose 11/12/2021 138  70 - 179 mg/dL Final    Calcium 84/69/6295 9.3  8.7 - 10.4 mg/dL Final    Albumin 28/41/3244 3.3 (L)  3.4 - 5.0 g/dL Final    Total Protein 11/12/2021 7.0  5.7 - 8.2 g/dL Final    Total Bilirubin 11/12/2021 0.5  0.3 - 1.2 mg/dL Final    AST 05/08/7251 19  <=34 U/L Final    ALT 11/12/2021 7 (L)  10 - 49 U/L Final    Alkaline Phosphatase 11/12/2021 109  46 - 116 U/L Final    PSA 11/12/2021 2.67  0.00 - 4.00 ng/mL Final    WBC 11/12/2021 6.8  3.6 - 11.2 10*9/L Final    RBC 11/12/2021 4.06 (L)  4.26 - 5.60 10*12/L Final    HGB 11/12/2021 10.3 (L)  12.9 - 16.5 g/dL Final    HCT 66/44/0347 31.8 (L)  39.0 - 48.0 % Final    MCV 11/12/2021 78.3  77.6 - 95.7 fL Final    MCH 11/12/2021 25.3 (L)  25.9 - 32.4 pg Final    MCHC 11/12/2021 32.4  32.0 - 36.0 g/dL Final    RDW 42/59/5638 19.0 (H)  12.2 - 15.2 % Final    MPV 11/12/2021 6.7 (L)  6.8 - 10.7 fL Final    Platelet 11/12/2021 609 (H)  150 - 450 10*9/L Final    Neutrophils % 11/12/2021 50.8  % Final    Lymphocytes % 11/12/2021 34.3  % Final    Monocytes % 11/12/2021 13.1  % Final    Eosinophils % 11/12/2021 0.6  % Final    Basophils % 11/12/2021 1.2  % Final    Absolute Neutrophils 11/12/2021 3.5  1.8 - 7.8 10*9/L Final    Absolute Lymphocytes 11/12/2021 2.3  1.1 - 3.6 10*9/L Final Absolute Monocytes 11/12/2021 0.9 (H)  0.3 - 0.8 10*9/L Final    Absolute Eosinophils 11/12/2021 0.0  0.0 - 0.5 10*9/L Final    Absolute Basophils 11/12/2021 0.1  0.0 - 0.1 10*9/L Final    Microcytosis 11/12/2021 Slight (A)  Not Present Final    Anisocytosis 11/12/2021 Slight (A)  Not Present Final    Hypochromasia 11/12/2021 Moderate (A)  Not Present Final

## 2021-11-12 NOTE — Unmapped (Signed)
Please remember to exercise daily and eat a heart-healthy diet.    For bone health while on hormone therapy, please take 800mg of calcium and 1200 international units of Vitamin D daily.    For questions/concerns please call the nurse triage line [phone] on Monday-Friday 8am-5pm.  On nights, weekends, and holidays, please call [phone].    Johnnye Sandford C. Matei Magnone, MD, PhD  Assistant Professor of Medicine, Division of Oncology  [facility]  Genitourinary Oncology Clinic  Fax: [phone]

## 2021-11-12 NOTE — Unmapped (Signed)
[facility] Visit Note    Patient Name: Mark Stephenson  Patient Age: 61 y.o.  Encounter Date: 11/12/2021  Attending Provider:  Laurette Schimke. 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 M with HTN, chronic pancreatitis and high volume mHSPC who will be treated per the ARASENS trial. He tolerated cycle 3 well, with no worsening of numbness in his feet. He had infusion reactions with cycle 2 and 3, so we have increased his dexamethasone and added famotidine pre-treatment. He was forgetting to take dexamethasone at home.    For Docetaxel: Administration, and schedule were reviewed with the patient. Side effects of docetaxel discussed included but were not limited to:  Nausea/vomiting, complications of myelosuppression, alopecia, flu-like symptoms, fatigue, taste changes, mucositis, edema, rash, and peripheral neuropathy.  We discussed techniques to manage some of these toxicities including but not limited to: anti-emetics for N/V, daily salt rinses for mucositis prevention, and low/moderate physical activity to help with fatigue. Patient understands and verbally consents to proceed with chemotherapy infusion today.     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  - local urologist to replace foley every 4 weeks, has not been replaced in about 2 months he estimates. Patient to stop by urologist office today for replacement and potential voiding trial  - hold off on TURP to see if ADT + chemotherapy can shrink prostate, attempt voiding trial with local urologist    3) Bone health  - DEXA scan with osteopenia (Frax 2.3%), repeat in Feb 2024  - continue calcium 800mg  daily + vitamin D 1200 international units daily    4) Metabolic health  - recommended daily exercise and heart-healthy diet for metabolic health while on ADT  - nutrition following    Reason for Visit  The patient is seen in consultation at the request of Ramonita Lab* for evaluation of prostate cancer.    History of Present Illness:  Oncology History Overview Note   03/19/21 - CT A/P with contrast performed to follow up on pancreatic mass, with near resolution of pancreatic uncinate process. Bulky multistation abdominopelvic lymphadenopathy identified (e.g. left para-aortic node measuring 2.1cm and right common iliac node measuring 1.4cm). No concerning osseous lesions identified. Enlarged, heterogeneously enhancing prostate gland.  04/17/21 -  PSA 48.83  05/29/21 - retroperitoneal lymph node core needle biopsy, pathology consistent with metastatic prostate adenocarcinoma.  06/27/21 - PSMA-PET scan with widely metastatic osseous disease as well as avid adenopathy extending from the pelvis to the left supraclavicular region consistent with metastatic prostate cancer. Noting that there is one single enlarged retroperitoneal node with low/minimal tracer avidity measuring up to 4 cm.  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)     06/04/2021 -  Cancer Staged    Staging form: Prostate, AJCC 8th Edition  - Clinical: Stage IVB (cT1c, cN1, cM1a, PSA: 58) - Signed by Davy Pique, MD on 06/04/2021           08/26/2021 -  Chemotherapy    OP PROSTATE DOCETAXEL/PREDNISONE  Docetaxel 75 mg/m2 evry 21 days       08/27/2021 Endocrine/Hormone Therapy    OP PROSTATE LEUPROLIDE (ELIGARD) 45 MG EVERY 6 MONTHS  Plan Provider: Davy Pique, MD       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 No nausea, vomiting, diarrhea with chemotherapy.  Occasional fatigue, but still remains active. Good appetite. Occasional hot flashes. No rash or worsening of numbness/tingling in his feet. He is taking darolutamide as prescribed.    Allergies:  No Known Allergies    Current Medications:    Current Outpatient Medications:     amLODIPine (NORVASC) 10 MG tablet, Take 1 tablet (10 mg total) by mouth daily., Disp: 90 tablet, Rfl: 0    buPROPion (WELLBUTRIN SR) 150 MG 12 hr tablet, Take 1 tablet (150 mg total) by mouth., Disp: , Rfl:     calcium carbonate-vitamin D3 600 mg-20 mcg (800 unit) Tab, Take 1 tablet by mouth two (2) times a day., Disp: 60 tablet, Rfl: 11    darolutamide (NUBEQA) 300 mg tablet, Take 2 tablets (600 mg total) by mouth in the morning and 2 tablets (600 mg total) in the evening. Take with food. Swallow tablets whole., Disp: 120 tablet, Rfl: 11 dexAMETHasone (DECADRON) 4 MG tablet, Take 8 mg (2 tablets) 12 hours before and 3 hours before each chemotherapy treatment., Disp: 12 tablet, Rfl: 2    hydroCHLOROthiazide (HYDRODIURIL) 25 MG tablet, Take 1 tablet (25 mg total) by mouth daily., Disp: 90 tablet, Rfl: 0    levoFLOXacin (LEVAQUIN) 500 MG tablet, , Disp: , Rfl:     pantoprazole (PROTONIX) 40 MG tablet, Take 1 tablet (40 mg total) by mouth., Disp: , Rfl:     pravastatin (PRAVACHOL) 20 MG tablet, Take 1 tablet (20 mg total) by mouth daily., Disp: 90 tablet, Rfl: 0    prochlorperazine (COMPAZINE) 10 MG tablet, Take 1 tablet (10 mg total) by mouth every six (6) hours as needed for nausea., Disp: 30 tablet, Rfl: 2    tamsulosin (FLOMAX) 0.4 mg capsule, , Disp: , Rfl:     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)        Past Surgical History:   Procedure Laterality Date    CHG X-RAY FOR BILE DUCT ENDOSCOPY  12/08/2019    Procedure: ENDOSCOPIC CATHETERIZATION OF THE BILIARY DUCTAL SYSTEM, RADIOLOGICAL SUPERVISION AND INTERPRETATION;  Surgeon: Chriss Driver, MD;  [facility];  Service: Gastroenterology    PR ERCP,SPHINCTEROTOMY N/A 12/08/2019    Procedure: ERCP; W/SPHINCTEROTOMY/PAPILLOTOMY;  Surgeon: Chriss Driver, MD;  [facility];  Service: Gastroenterology    PR UPGI ENDOSCOPY W/US FN BX N/A 12/08/2019    Procedure: UGI W/ TRANSENDOSCOPIC ULTRASOUND GUIDED INTRAMURAL/TRANSMURAL FINE NEEDLE ASPIRATION/BIOPSY(S), ESOPHAGUS;  Surgeon: Chriss Driver, MD;  [facility];  Service: Gastroenterology Social History     Occupational History    Occupation: disability   Tobacco Use    Smoking status: Every Day    Smokeless tobacco: Never    Tobacco comments:     1 pack per week. reports gradually.    Vaping Use    Vaping Use: Never used   Substance and Sexual Activity    Alcohol use: Yes     Alcohol/week: 21.0 standard drinks     Types: 21 Cans of beer per week     Comment: 3x 40-once cans of beer per day    Drug use: No    Sexual activity: Not on file       Family History  Family History   Problem Relation Age of Onset    Cancer Father     Cancer Brother     Anesthesia problems Neg Hx      Prostate Cancer Family History Assessment:  History of cancer in children (yes/no; if yes, what type AND age of diagnosis): no children  Total number of siblings (including deceased): 4 brothers, 2 sisters  History of cancer in siblings (yes/no; if yes, provide relation, type of cancer, AND age of diagnosis): oldest brother had unknown type of cancer  History of cancer in parents (yes/no; if yes, please specify parent, type of cancer, AND age of diagnosis): mother - unknown, father passed from unknown cancer  History of cancer in aunts/uncles/grandparents (yes/no; if yes, provide relation, type of cancer, AND age of diagnosis): none known      Review of Systems:  A comprehensive review of 10 systems was negative except for pertinent positives noted in HPI.    Physical Exam:  VITAL SIGNS:  BP 129/74  - Pulse 61  - Temp 35.9 ??C (96.6 ??F) (Tympanic)  - Resp 16  - Ht 175.3 cm (5' 9)  - Wt 71.9 kg (158 lb 9.6 oz)  - SpO2 100%  - BMI 23.42 kg/m??   ECOG Performance Status: 0  GENERAL: Well-developed, well-nourished patient in no acute distress.  HEAD: Normocephalic and atraumatic.  EYES: Conjunctivae are normal. No scleral icterus.  MOUTH/THROAT: Oropharynx is clear and moist.  No mucosal lesions. Poor dentition.  NECK: Supple, no thyromegaly.  CARDIOVASCULAR: Normal rate, regular rhythm and normal heart sounds.  Exam reveals no gallop and no friction rub.  No murmur  heard.  PULMONARY/CHEST: Effort normal and breath sounds normal. No respiratory distress.  ABDOMINAL:  Soft. There is no distension. There is no tenderness. There is no rebound and no guarding.  MUSCULOSKELETAL: No clubbing, cyanosis, or lower extremity edema.  PSYCHIATRIC: Alert and oriented.  Normal mood and affect.  NEUROLOGIC: No focal motor deficit. Normal gait.  SKIN: Skin is warm, dry, and intact.      Results/Orders:    Lab on 11/12/2021   Component Date Value Ref Range Status    Sodium 11/12/2021 140  135 - 145 mmol/L Final    Potassium 11/12/2021 4.5  3.5 - 5.1 mmol/L Final    Chloride 11/12/2021 103  98 - 107 mmol/L Final    CO2 11/12/2021 27.0  20.0 - 31.0 mmol/L Final    Anion Gap 11/12/2021 10  5 - 14 mmol/L Final    BUN 11/12/2021 7 (L)  9 - 23 mg/dL Final    Creatinine 16/02/9603 0.73  0.60 - 1.10 mg/dL Final    BUN/Creatinine Ratio 11/12/2021 10   Final    eGFR CKD-EPI (2021) Male 11/12/2021 >90  >=60 mL/min/1.18m2 Final    eGFR calculated with CKD-EPI 2021 equation in accordance with [employer] and [employer] of Nephrology Task Force recommendations.    Glucose 11/12/2021 138  70 - 179 mg/dL Final    Calcium 54/01/8118 9.3  8.7 - 10.4 mg/dL Final    Albumin 14/78/2956 3.3 (L)  3.4 - 5.0 g/dL Final    Total Protein 11/12/2021 7.0  5.7 - 8.2 g/dL Final    Total Bilirubin 11/12/2021 0.5  0.3 - 1.2 mg/dL Final    AST 21/30/8657 19  <=34 U/L Final    ALT 11/12/2021 7 (L)  10 - 49 U/L Final    Alkaline Phosphatase 11/12/2021 109  46 - 116 U/L Final    PSA 11/12/2021 2.67  0.00 - 4.00 ng/mL Final    WBC 11/12/2021 6.8  3.6 - 11.2 10*9/L Final    RBC 11/12/2021 4.06 (L)  4.26 - 5.60 10*12/L Final    HGB 11/12/2021 10.3 (L)  12.9 - 16.5 g/dL Final    HCT 84/69/6295 31.8 (L)  39.0 - 48.0 % Final    MCV 11/12/2021 78.3  77.6 - 95.7 fL Final    MCH 11/12/2021 25.3 (L)  25.9 - 32.4 pg Final    MCHC 11/12/2021 32.4  32.0 - 36.0 g/dL Final    RDW 28/41/3244 19.0 (H)  12.2 - 15.2 % Final    MPV 11/12/2021 6.7 (L)  6.8 - 10.7 fL Final    Platelet 11/12/2021 609 (H)  150 - 450 10*9/L Final    Neutrophils % 11/12/2021 50.8  % Final    Lymphocytes % 11/12/2021 34.3  % Final    Monocytes % 11/12/2021 13.1  % Final    Eosinophils % 11/12/2021 0.6  % Final    Basophils % 11/12/2021 1.2  % Final    Absolute Neutrophils 11/12/2021 3.5  1.8 - 7.8 10*9/L Final    Absolute Lymphocytes 11/12/2021 2.3  1.1 - 3.6 10*9/L Final    Absolute Monocytes 11/12/2021 0.9 (H)  0.3 - 0.8 10*9/L Final    Absolute Eosinophils 11/12/2021 0.0  0.0 - 0.5 10*9/L Final    Absolute Basophils 11/12/2021 0.1  0.0 - 0.1 10*9/L Final    Microcytosis 11/12/2021 Slight (A)  Not Present Final    Anisocytosis 11/12/2021 Slight (A)  Not Present Final    Hypochromasia 11/12/2021 Moderate (  A)  Not Present Final       Lab Results   Component Value Date    PSA 2.67 11/12/2021    PSA 4.78 (H) 10/22/2021    PSA 21.05 (H) 09/17/2021    PSA 64.37 (H) 08/27/2021    PSA 406.39 (H) 07/30/2021    PSA 48.83 (H) 04/17/2021       Orders placed or performed in visit on 11/12/21    CBC and differential    Comprehensive Metabolic Panel    PSA, Diagnostic    CBC and differential    Comprehensive Metabolic Panel    PSA, Diagnostic    Clinic Appointment Request Physician

## 2021-11-12 NOTE — Unmapped (Signed)
PIV placed.  Labs drawn & sent for analysis. To next appt.  Care provided by Shelly Wood RN.

## 2021-11-12 NOTE — Unmapped (Signed)
Pt in clinic for C4D1 docetaxel. Pt denied any ongoing or increasing neuropathy. Pt completed infusion and tolerated well. Pt provided with AVS. Pt discharged to home in stable condition.

## 2021-11-20 ENCOUNTER — Ambulatory Visit: Payer: Medicaid Other | Admitting: Physician Assistant

## 2021-11-20 ENCOUNTER — Ambulatory Visit (INDEPENDENT_AMBULATORY_CARE_PROVIDER_SITE_OTHER): Payer: Medicaid Other | Admitting: Physician Assistant

## 2021-11-20 DIAGNOSIS — R339 Retention of urine, unspecified: Secondary | ICD-10-CM | POA: Diagnosis not present

## 2021-11-20 LAB — BLADDER SCAN AMB NON-IMAGING: Scan Result: 51

## 2021-11-20 MED ORDER — SULFAMETHOXAZOLE-TRIMETHOPRIM 800-160 MG PO TABS: 1.0000 | ORAL_TABLET | Freq: Two times a day (BID) | ORAL | 0 refills | Status: AC

## 2021-11-20 NOTE — Progress Notes (Signed)
Catheter Removal  Patient is present today for a catheter removal.  32m of water was drained from the balloon. A 16FR foley cath was removed from the bladder no complications were noted . Patient tolerated well.  Performed by: JGaspar ColaCMA  Follow up/ Additional notes: Return this afternoon

## 2021-11-20 NOTE — Progress Notes (Signed)
11/20/2021 2:10 PM   [name] November 13, 1960 [id number]  CC: Chief Complaint  Patient presents with   Urinary Retention   HPI: Louis Gardner is a 61 y.o. male with metastatic prostate cancer on ADT, darolutamide, and docetaxel managed by medical oncology at [facility] with a recent months-long history of urinary retention who presents today for voiding trial.   Foley catheters have not been replaced on a monthly basis since going into retention in February 2023.  His Foley was most recently changed in [facility] ED on 10/01/2021.  He was treated with empiric Levaquin at that time due to concerns for UTI.  Foley catheter removed in the morning, see separate procedure note for details.  He returned to clinic in the afternoon.  He reports he has been able to urinate.  PVR 51 mL.  PMH: Past Medical History:  Diagnosis Date   Arthritis    Depression    GI bleeding    Hypertension    Pancreatic cancer ([place])    Sleep apnea     Surgical History: Past Surgical History:  Procedure Laterality Date   COLONOSCOPY     ESOPHAGOGASTRODUODENOSCOPY (EGD) WITH PROPOFOL N/A 03/19/2016   Procedure: ESOPHAGOGASTRODUODENOSCOPY (EGD) WITH PROPOFOL;  Surgeon: Lollie Sails, MD;  Location: [facility] ENDOSCOPY;  Service: Endoscopy;  Laterality: N/A;    Home Medications:  Allergies as of 11/20/2021   No Known Allergies      Medication List        Accurate as of November 20, 2021  2:10 PM. If you have any questions, ask your nurse or doctor.          amLODipine 10 MG tablet Commonly known as: NORVASC Take 10 mg by mouth daily.   buPROPion 150 MG 12 hr tablet Commonly known as: WELLBUTRIN SR Take 150 mg by mouth 2 (two) times daily.   Calcium Carb-Cholecalciferol 600-20 MG-MCG Tabs Take by mouth.   hydrochlorothiazide 12.5 MG tablet Commonly known as: HYDRODIURIL Take 12.5 mg by mouth daily.   pantoprazole 40 MG tablet Commonly known as: PROTONIX Take 40 mg by mouth daily.    pravastatin 20 MG tablet Commonly known as: PRAVACHOL   sulfamethoxazole-trimethoprim 800-160 MG tablet Commonly known as: BACTRIM DS Take 1 tablet by mouth 2 (two) times daily for 3 days.        Allergies:  No Known Allergies  Family History: No family history on file.  Social History:   reports that he has been smoking cigarettes. He has never used smokeless tobacco. He reports current alcohol use of about 6.0 standard drinks of alcohol per week. He reports that he does not use drugs.  Physical Exam: There were no vitals taken for this visit.  Constitutional:  Alert and oriented, no acute distress, nontoxic appearing HEENT: [place], AT Cardiovascular: No clubbing, cyanosis, or edema Respiratory: Normal respiratory effort, no increased work of breathing Skin: No rashes, bruises or suspicious lesions Neurologic: Grossly intact, no focal deficits, moving all 4 extremities Psychiatric: Normal mood and affect  Laboratory Data: Results for orders placed or performed in visit on 11/20/21  Bladder Scan (Post Void Residual) in office  Result Value Ref Range   Scan Result 51    Assessment & Plan:   1. Urinary retention Voiding trial passed today.  Given duration since last Foley exchange, we will treat with 3 days of Bactrim DS twice daily for UTI prevention.  We will plan to recheck a PVR in 1 month.  Counseled patient  on return precautions including difficulty voiding and lower abdominal pain. - Bladder Scan (Post Void Residual) in office - sulfamethoxazole-trimethoprim (BACTRIM DS) 800-160 MG tablet; Take 1 tablet by mouth 2 (two) times daily for 3 days.  Dispense: 6 tablet; Refill: 0  Return in about 4 weeks (around 12/18/2021) for Repeat PVR.  Debroah Loop, PA-C  [facility] [address] [place], [place] [address] [phone]

## 2021-11-30 NOTE — Unmapped (Signed)
[facility] Refill Coordination Note    Specialty Medication(s) to be Shipped:   Hematology/Oncology: Nubeqa    Other medication(s) to be shipped: No additional medications requested for fill at this time     Mark Stephenson, DOB: Sep 04, 1960  Phone: [phone] (home)       All above HIPAA information was verified with patient.     Was a Nurse, learning disability used for this call? No    Completed refill call assessment today to schedule patient's medication shipment from the [facility] [phone]).  All relevant notes have been reviewed.     Specialty medication(s) and dose(s) confirmed: Regimen is correct and unchanged.   Changes to medications: Pierino reports no changes at this time.  Changes to insurance: No  New side effects reported not previously addressed with a pharmacist or physician: None reported  Questions for the pharmacist: No    Confirmed patient received a Conservation officer, historic buildings and a Surveyor, mining with first shipment. The patient will receive a drug information handout for each medication shipped and additional FDA Medication Guides as required.       DISEASE/MEDICATION-SPECIFIC INFORMATION        N/A    SPECIALTY MEDICATION ADHERENCE     Medication Adherence    Patient reported X missed doses in the last month: 0  Specialty Medication: nubeqa 300mg   Patient is on additional specialty medications: No  Patient is on more than two specialty medications: No  Any gaps in refill history greater than 2 weeks in the last 3 months: no  Demonstrates understanding of importance of adherence: yes  Informant: patient  Reliability of informant: reliable  Provider-estimated medication adherence level: good  Patient is at risk for Non-Adherence: No  Reasons for non-adherence: no problems identified  Confirmed plan for next specialty medication refill: delivery by pharmacy  Refills needed for supportive medications: not needed          Refill Coordination    Has the Patients' Contact Information Changed: No  Is the Shipping Address Different: No         Were doses missed due to medication being on hold? No    nubeqa 300 mg: 14 days of medicine on hand       REFERRAL TO PHARMACIST     Referral to the pharmacist: Not needed      [facility]     Shipping address confirmed in Epic.     Delivery Scheduled: Yes, Expected medication delivery date: 08/03.     Medication will be delivered via Next Day Courier to the prescription address in Epic WAM.    Antonietta Barcelona   [facility] Specialty Technician

## 2021-12-04 NOTE — Unmapped (Signed)
Mailed 9/25 appt reminder to pt

## 2021-12-04 NOTE — Unmapped (Signed)
Called and reminded patient and his friend Mark Stephenson of his appointment for tomorrow for labs, to see Thermon Leyland and then his infusion.

## 2021-12-05 ENCOUNTER — Ambulatory Visit: Admit: 2021-12-05 | Discharge: 2021-12-06 | Payer: PRIVATE HEALTH INSURANCE | Attending: Family | Primary: Family

## 2021-12-05 ENCOUNTER — Other Ambulatory Visit: Admit: 2021-12-05 | Discharge: 2021-12-06 | Payer: PRIVATE HEALTH INSURANCE

## 2021-12-05 ENCOUNTER — Ambulatory Visit: Admit: 2021-12-05 | Discharge: 2021-12-06 | Payer: PRIVATE HEALTH INSURANCE

## 2021-12-05 DIAGNOSIS — C61 Malignant neoplasm of prostate: Principal | ICD-10-CM

## 2021-12-05 DIAGNOSIS — C775 Secondary and unspecified malignant neoplasm of intrapelvic lymph nodes: Principal | ICD-10-CM

## 2021-12-05 LAB — CBC W/ AUTO DIFF
BASOPHILS ABSOLUTE COUNT: 0 10*9/L (ref 0.0–0.1)
BASOPHILS RELATIVE PERCENT: 0.4 %
EOSINOPHILS ABSOLUTE COUNT: 0 10*9/L (ref 0.0–0.5)
EOSINOPHILS RELATIVE PERCENT: 0.6 %
HEMATOCRIT: 32.3 % — ABNORMAL LOW (ref 39.0–48.0)
HEMOGLOBIN: 10.6 g/dL — ABNORMAL LOW (ref 12.9–16.5)
LYMPHOCYTES ABSOLUTE COUNT: 1.9 10*9/L (ref 1.1–3.6)
LYMPHOCYTES RELATIVE PERCENT: 26.4 %
MEAN CORPUSCULAR HEMOGLOBIN CONC: 32.9 g/dL (ref 32.0–36.0)
MEAN CORPUSCULAR HEMOGLOBIN: 25.4 pg — ABNORMAL LOW (ref 25.9–32.4)
MEAN CORPUSCULAR VOLUME: 77.3 fL — ABNORMAL LOW (ref 77.6–95.7)
MEAN PLATELET VOLUME: 7 fL (ref 6.8–10.7)
MONOCYTES ABSOLUTE COUNT: 0.9 10*9/L — ABNORMAL HIGH (ref 0.3–0.8)
MONOCYTES RELATIVE PERCENT: 12.5 %
NEUTROPHILS ABSOLUTE COUNT: 4.4 10*9/L (ref 1.8–7.8)
NEUTROPHILS RELATIVE PERCENT: 60.1 %
PLATELET COUNT: 495 10*9/L — ABNORMAL HIGH (ref 150–450)
RED BLOOD CELL COUNT: 4.18 10*12/L — ABNORMAL LOW (ref 4.26–5.60)
RED CELL DISTRIBUTION WIDTH: 20.4 % — ABNORMAL HIGH (ref 12.2–15.2)
WBC ADJUSTED: 7.4 10*9/L (ref 3.6–11.2)

## 2021-12-05 LAB — PSA: PROSTATE SPECIFIC ANTIGEN: 1.44 ng/mL (ref 0.00–4.00)

## 2021-12-05 LAB — COMPREHENSIVE METABOLIC PANEL
ALBUMIN: 3.5 g/dL (ref 3.4–5.0)
ALKALINE PHOSPHATASE: 102 U/L (ref 46–116)
ALT (SGPT): 11 U/L (ref 10–49)
ANION GAP: 10 mmol/L (ref 5–14)
AST (SGOT): 21 U/L (ref <=34)
BILIRUBIN TOTAL: 0.5 mg/dL (ref 0.3–1.2)
BLOOD UREA NITROGEN: 11 mg/dL (ref 9–23)
BUN / CREAT RATIO: 16
CALCIUM: 9.4 mg/dL (ref 8.7–10.4)
CHLORIDE: 103 mmol/L (ref 98–107)
CO2: 25 mmol/L (ref 20.0–31.0)
CREATININE: 0.68 mg/dL
EGFR CKD-EPI (2021) MALE: >90 mL/min/{1.73_m2} (ref >=60)
GLUCOSE RANDOM: 80 mg/dL (ref 70–179)
POTASSIUM: 4.8 mmol/L (ref 3.5–5.1)
PROTEIN TOTAL: 7.3 g/dL (ref 5.7–8.2)
SODIUM: 138 mmol/L (ref 135–145)

## 2021-12-05 MED ADMIN — famotidine (PEPCID) tablet 20 mg: 20 mg | ORAL | @ 19:00:00 | Stop: 2021-12-05

## 2021-12-05 MED ADMIN — DOCEtaxel (TAXOTERE) 133.5 mg in sodium chloride NON-PVC (NS) 0.9 % 250 mL IVPB: 75 mg/m2 | INTRAVENOUS | @ 20:00:00 | Stop: 2021-12-05

## 2021-12-05 MED ADMIN — dexamethasone (DECADRON) 4 mg/mL injection 20 mg: 20 mg | INTRAVENOUS | @ 19:00:00 | Stop: 2021-12-05

## 2021-12-05 MED ADMIN — sodium chloride (NS) 0.9 % infusion: 100 mL/h | INTRAVENOUS | @ 19:00:00

## 2021-12-05 MED FILL — NUBEQA 300 MG TABLET: ORAL | 30 days supply | Qty: 120 | Fill #3

## 2021-12-05 NOTE — Unmapped (Signed)
PIV placed.  Labs drawn & sent for analysis. To next appt.  Care provided by Jeff Fasking LPN.

## 2021-12-05 NOTE — Unmapped (Signed)
14:50 - Pt ambulated with stable gait to treatment area after seen and evaluated by provider for planned treatment with Docetaxel. Pt states feeling well and denies any signs/symptoms of neuropathy. Pt with PIV to Left Forearm with good BR. Labs reviewed, within parameters to treat. Pt aware and in agreement with plan. Pre-medicated per order.  17:10 - pt completed treatment well and PIV with good BR at completion. PIV flushed and removed per protocol and pt given copy of AVS. Pt ambulated from treatment area with stable gait aware of follow-up.

## 2021-12-06 DIAGNOSIS — C775 Secondary and unspecified malignant neoplasm of intrapelvic lymph nodes: Principal | ICD-10-CM

## 2021-12-06 DIAGNOSIS — C61 Malignant neoplasm of prostate: Principal | ICD-10-CM

## 2021-12-06 NOTE — Unmapped (Signed)
[facility] Visit Note    Patient Name: Mark Stephenson  Patient Age: 61 y.o.  Encounter Date: 12/05/2021  Attending Provider:  Laurette Schimke. 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 M with HTN, chronic pancreatitis and high volume mHSPC who will be treated per the ARASENS trial. He tolerated cycles 1  and 2 well, with no worsening of numbness in his feet.        Recommendations  1) Prostate cancer -   Return to clinic: 3 weeks for cycle 6 and in 6 weeks with labs, PMSA PET, and visit to review    Imaging:  - PSMA-PET scan with widely metastatic disease noted at baseline    Genetics:  - [facility] tumor genetic testing reveals APC and TP53 mutations, MSS  - Germline genetic testing with no germline mutations detected    Treatment:  - ADT: ADT started 07/30/21, Eligard 45mg  received 4/24, next due 01/26/22  - ARSI: started darolutamide per ARASENS trial in mid April 2023  - chemotherapy: docetaxel chemotherapy 75mg /m2 started 08/27/21, due for cycle 5 today; plan for 6 cycles and will repeat imaging (PMSA PET) after cycle 6       - 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  - local urologist to replace foley every 4 weeks  - hold off on TURP to see if ADT + chemotherapy can shrink prostate    3) Bone health  - DEXA scan with osteopenia (Frax 2.3%), repeat in Feb 2024  - continue calcium 800mg  daily + vitamin D 1200 international units daily    4) Metabolic health  - recommended daily exercise and heart-healthy diet for metabolic health while on ADT  - nutrition following    Reason for Visit  The patient is seen in consultation at the request of Aycock, Ngwe Achirimofo* for evaluation of prostate cancer.    History of Present Illness:  Oncology History Overview Note   03/19/21 - CT A/P with contrast performed to follow up on pancreatic mass, with near resolution of pancreatic uncinate process. Bulky multistation abdominopelvic lymphadenopathy identified (e.g. left para-aortic node measuring 2.1cm and right common iliac node measuring 1.4cm). No concerning osseous lesions identified. Enlarged, heterogeneously enhancing prostate gland.  04/17/21 - PSA 48.83  05/29/21 - retroperitoneal lymph node core needle biopsy, pathology consistent with metastatic prostate adenocarcinoma.  06/27/21 - PSMA-PET scan with widely metastatic osseous disease as well as avid adenopathy extending from the pelvis to the left supraclavicular region consistent with metastatic prostate cancer. Noting that there is one single enlarged retroperitoneal node with low/minimal tracer avidity measuring up to 4 cm.  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)     06/04/2021 -  Cancer Staged    Staging form: Prostate, AJCC 8th Edition  - Clinical: Stage IVB (cT1c, cN1, cM1a, PSA: 67) - Signed by Davy Pique, MD on 06/04/2021           08/26/2021 -  Chemotherapy    OP PROSTATE DOCETAXEL/PREDNISONE  Docetaxel 75 mg/m2 evry 21 days       08/27/2021 Endocrine/Hormone Therapy    OP PROSTATE LEUPROLIDE (ELIGARD) 45 MG EVERY 6 MONTHS  Plan Provider: Davy Pique, MD       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 Mild fatigue.  No fevers, chills, worsening numbness/tingling, nail/skin changes. No swelling in LE.     Allergies:  No Known Allergies    Current Medications:    Current Outpatient Medications:     amLODIPine (NORVASC) 10 MG tablet, Take 1 tablet (10 mg total) by mouth daily., Disp: 90 tablet, Rfl: 0    buPROPion (WELLBUTRIN SR) 150 MG 12 hr tablet, Take 1 tablet (150 mg total) by mouth., Disp: , Rfl:     calcium carbonate-vitamin D3 600 mg-20 mcg (800 unit) Tab, Take 1 tablet by mouth two (2) times a day., Disp: 60 tablet, Rfl: 11    darolutamide (NUBEQA) 300 mg tablet, Take 2 tablets (600 mg total) by mouth in the morning and 2 tablets (600 mg total) in the evening. Take with food. Swallow tablets whole., Disp: 120 tablet, Rfl: 11    dexAMETHasone (DECADRON) 4 MG tablet, Take 8 mg (2 tablets) 12 hours before and 3 hours before each chemotherapy treatment., Disp: 12 tablet, Rfl: 2    hydroCHLOROthiazide (HYDRODIURIL) 25 MG tablet, Take 1 tablet (25 mg total) by mouth daily., Disp: 90 tablet, Rfl: 0    levoFLOXacin (LEVAQUIN) 500 MG tablet, , Disp: , Rfl:     pantoprazole (PROTONIX) 40 MG tablet, Take 1 tablet (40 mg total) by mouth., Disp: , Rfl:     pravastatin (PRAVACHOL) 20 MG tablet, Take 1 tablet (20 mg total) by mouth daily., Disp: 90 tablet, Rfl: 0    prochlorperazine (COMPAZINE) 10 MG tablet, Take 1 tablet (10 mg total) by mouth every six (6) hours as needed for nausea., Disp: 30 tablet, Rfl: 2    sulfamethoxazole-trimethoprim (BACTRIM DS) 800-160 mg per tablet, TAKE 1 TABLET BY MOUTH TWICE DAILY FOR 3 DAYS, Disp: , Rfl:     tamsulosin (FLOMAX) 0.4 mg capsule, , Disp: , Rfl:   No current facility-administered medications for this visit.    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)        Past Surgical History:  Procedure Laterality Date    CHG X-RAY FOR BILE DUCT ENDOSCOPY  12/08/2019    Procedure: ENDOSCOPIC CATHETERIZATION OF THE BILIARY DUCTAL SYSTEM, RADIOLOGICAL SUPERVISION AND INTERPRETATION;  Surgeon: Chriss Driver, MD;  [facility];  Service: Gastroenterology    PR ERCP,SPHINCTEROTOMY N/A 12/08/2019    Procedure: ERCP; W/SPHINCTEROTOMY/PAPILLOTOMY;  Surgeon: Chriss Driver, MD;  [facility];  Service: Gastroenterology    PR UPGI ENDOSCOPY W/US FN BX N/A 12/08/2019    Procedure: UGI W/ TRANSENDOSCOPIC ULTRASOUND GUIDED INTRAMURAL/TRANSMURAL FINE NEEDLE ASPIRATION/BIOPSY(S), ESOPHAGUS;  Surgeon: Chriss Driver, MD;  [facility];  Service: Gastroenterology        Social History     Occupational History    Occupation: disability   Tobacco Use    Smoking status: Every Day    Smokeless tobacco: Never    Tobacco comments:     1 pack per week. reports gradually.    Vaping Use    Vaping Use: Never used   Substance and Sexual Activity    Alcohol use: Yes     Alcohol/week: 21.0 standard drinks     Types: 21 Cans of beer per week     Comment: 3x 40-once cans of beer per day    Drug use: No    Sexual activity: Not on file       Family History  Family History   Problem Relation Age of Onset    Cancer Father     Cancer Brother     Anesthesia problems Neg Hx      Prostate Cancer Family History Assessment:  History of cancer in children (yes/no; if yes, what type AND age of diagnosis): no children  Total number of siblings (including deceased): 4 brothers, 2 sisters  History of cancer in siblings (yes/no; if yes, provide relation, type of cancer, AND age of diagnosis): oldest brother had unknown type of cancer  History of cancer in parents (yes/no; if yes, please specify parent, type of cancer, AND age of diagnosis): mother - unknown, father passed from unknown cancer  History of cancer in aunts/uncles/grandparents (yes/no; if yes, provide relation, type of cancer, AND age of diagnosis): none known      Review of Systems:  A comprehensive review of 10 systems was negative except for pertinent positives noted in HPI.    Physical Exam:  VITAL SIGNS:  BP 122/69  - Pulse 73  - Temp 36.1 ??C (97 ??F) (Temporal)  - Resp 18  - Ht 175.3 cm (5' 9)  - Wt 72.3 kg (159 lb 6.4 oz)  - SpO2 100%  - BMI 23.54 kg/m??   ECOG Performance Status: 0  GENERAL: Well-developed, well-nourished patient in no acute distress.  HEAD: Normocephalic and atraumatic.  EYES: Conjunctivae are normal. No scleral icterus.  MOUTH/THROAT: Oropharynx is clear and moist.  No mucosal lesions. Poor dentition, with tooth on the bottom right decaying.  NECK: Supple, no thyromegaly.  CARDIOVASCULAR: Normal rate, regular rhythm and normal heart sounds.  Exam reveals no gallop and no friction rub.  No murmur heard.  PULMONARY/CHEST: Effort normal and breath sounds normal. No respiratory distress.  ABDOMINAL:  Soft. There is no distension. There is no tenderness. There is no rebound and no guarding.  MUSCULOSKELETAL: No clubbing, cyanosis, or lower extremity edema.  PSYCHIATRIC: Alert and oriented.  Normal mood and affect.  NEUROLOGIC: No focal motor deficit. Normal gait.  SKIN: Skin is warm, dry, and intact.  Results/Orders:    Lab on 12/05/2021   Component Date Value Ref Range Status    Sodium 12/05/2021 138  135 - 145 mmol/L Final    Potassium 12/05/2021 4.8  3.5 - 5.1 mmol/L Final    Chloride 12/05/2021 103 98 - 107 mmol/L Final    CO2 12/05/2021 25.0  20.0 - 31.0 mmol/L Final    Anion Gap 12/05/2021 10  5 - 14 mmol/L Final    BUN 12/05/2021 11  9 - 23 mg/dL Final    Creatinine 61/95/0932 0.68  0.60 - 1.10 mg/dL Final    BUN/Creatinine Ratio 12/05/2021 16   Final    eGFR CKD-EPI (2021) Male 12/05/2021 >90  >=60 mL/min/1.54m2 Final    eGFR calculated with CKD-EPI 2021 equation in accordance with [employer] and [employer] of Nephrology Task Force recommendations.    Glucose 12/05/2021 80  70 - 179 mg/dL Final    Calcium 67/04/4579 9.4  8.7 - 10.4 mg/dL Final    Albumin 99/83/3825 3.5  3.4 - 5.0 g/dL Final    Total Protein 12/05/2021 7.3  5.7 - 8.2 g/dL Final    Total Bilirubin 12/05/2021 0.5  0.3 - 1.2 mg/dL Final    AST 05/39/7673 21  <=34 U/L Final    ALT 12/05/2021 11  10 - 49 U/L Final    Alkaline Phosphatase 12/05/2021 102  46 - 116 U/L Final    PSA 12/05/2021 1.44  0.00 - 4.00 ng/mL Final    WBC 12/05/2021 7.4  3.6 - 11.2 10*9/L Final    RBC 12/05/2021 4.18 (L)  4.26 - 5.60 10*12/L Final    HGB 12/05/2021 10.6 (L)  12.9 - 16.5 g/dL Final    HCT 41/93/7902 32.3 (L)  39.0 - 48.0 % Final    MCV 12/05/2021 77.3 (L)  77.6 - 95.7 fL Final    MCH 12/05/2021 25.4 (L)  25.9 - 32.4 pg Final    MCHC 12/05/2021 32.9  32.0 - 36.0 g/dL Final    RDW 40/97/3532 20.4 (H)  12.2 - 15.2 % Final    MPV 12/05/2021 7.0  6.8 - 10.7 fL Final    Platelet 12/05/2021 495 (H)  150 - 450 10*9/L Final    Neutrophils % 12/05/2021 60.1  % Final    Lymphocytes % 12/05/2021 26.4  % Final    Monocytes % 12/05/2021 12.5  % Final    Eosinophils % 12/05/2021 0.6  % Final    Basophils % 12/05/2021 0.4  % Final    Absolute Neutrophils 12/05/2021 4.4  1.8 - 7.8 10*9/L Final    Absolute Lymphocytes 12/05/2021 1.9  1.1 - 3.6 10*9/L Final    Absolute Monocytes 12/05/2021 0.9 (H)  0.3 - 0.8 10*9/L Final    Absolute Eosinophils 12/05/2021 0.0  0.0 - 0.5 10*9/L Final    Absolute Basophils 12/05/2021 0.0  0.0 - 0.1 10*9/L Final Microcytosis 12/05/2021 Slight (A)  Not Present Final    Anisocytosis 12/05/2021 Moderate (A)  Not Present Final    Hypochromasia 12/05/2021 Moderate (A)  Not Present Final       Lab Results   Component Value Date    PSA 1.44 12/05/2021    PSA 2.67 11/12/2021    PSA 4.78 (H) 10/22/2021    PSA 21.05 (H) 09/17/2021    PSA 64.37 (H) 08/27/2021    PSA 406.39 (H) 07/30/2021    PSA 48.83 (H) 04/17/2021       Orders placed or performed in visit on  12/05/21    PET CT Skull Base to Thigh    Clinic Appointment Request APP    Clinic Appointment Request Physician

## 2021-12-06 NOTE — Unmapped (Signed)
Proceed with Docetaxel today    RTC in 3 weeks for C6 and in 6 weeks with labs, PET, and visit to review

## 2021-12-06 NOTE — Unmapped (Signed)
RED ZONE Means: RED ZONE: Take action now!     You need to be seen right away  Symptoms are at a severe level of discomfort    Call 911 or go to your nearest  Hospital for help     - Bleeding that will not stop    - Hard to breathe    - New seizure - Chest pain  - Fall or passing out  -Thoughts of hurting    yourself or others      Call 911 if you are going into the RED ZONE                  YELLOW ZONE Means:     Please call with any new or worsening symptom(s), even if not on this list.  Call [phone]  After hours, weekends, and holidays - you will reach a long recording with specific instructions, If not in an emergency such as above, please listen closely all the way to the end and choose the option that relates to your need.   You can be seen by a provider the same day through our Same Day Acute Care for Patients with Cancer program.      YELLOW ZONE: Take action today     Symptoms are new or worsening  You are not within your goal range for:    - Pain    - Shortness of breath    - Bleeding (nose, urine, stool, wound)    - Feeling sick to your stomach and throwing up    - Mouth sores/pain in your mouth or throat    - Hard stool or very loose stools (increase in       ostomy output)    - No urine for 12 hours    - Feeding tube or other catheter/tube issue    - Redness or pain at previous IV or port/catheter site    - Depressed or anxiety   - Swelling (leg, arm, abdomen,     face, neck)  - Skin rash or skin changes  - Wound issues (redness, drainage,    re-opened)  - Confusion  - Vision changes  - Fever >100.4 F or chills  - Worsening cough with mucus that is    green, yellow, or bloody  - Pain or burning when going to the    bathroom  - Home Infusion Pump Issue- call    [phone]         Call your healthcare provider if you are going into the YELLOW ZONE     GREEN ZONE Means:  Your symptoms are under controls  Continue to take your medicine as ordered  Keep all visits to the provider GREEN ZONE: You are in control  No increase or worsening symptoms  Able to take your medicine  Able to drink and eat    - DO NOT use MyChart messages to report red or yellow symptoms. Allow up to 3    business days for a reply.  -MyChart is for non-urgent medication refills, scheduling requests, or other general questions.         [id number] Rev. 11/02/2021  Approved by Oncology Patient Education Committee          Lab on 12/05/2021   Component Date Value Ref Range Status    Sodium 12/05/2021 138  135 - 145 mmol/L Final    Potassium 12/05/2021 4.8  3.5 - 5.1 mmol/L Final    Chloride 12/05/2021 103  98 - 107 mmol/L Final    CO2 12/05/2021 25.0  20.0 - 31.0 mmol/L Final    Anion Gap 12/05/2021 10  5 - 14 mmol/L Final    BUN 12/05/2021 11  9 - 23 mg/dL Final    Creatinine 16/02/9603 0.68  0.60 - 1.10 mg/dL Final    BUN/Creatinine Ratio 12/05/2021 16   Final    eGFR CKD-EPI (2021) Male 12/05/2021 >90  >=60 mL/min/1.55m2 Final    eGFR calculated with CKD-EPI 2021 equation in accordance with [employer] and [employer] of Nephrology Task Force recommendations.    Glucose 12/05/2021 80  70 - 179 mg/dL Final    Calcium 54/01/8118 9.4  8.7 - 10.4 mg/dL Final    Albumin 14/78/2956 3.5  3.4 - 5.0 g/dL Final    Total Protein 12/05/2021 7.3  5.7 - 8.2 g/dL Final    Total Bilirubin 12/05/2021 0.5  0.3 - 1.2 mg/dL Final    AST 21/30/8657 21  <=34 U/L Final    ALT 12/05/2021 11  10 - 49 U/L Final    Alkaline Phosphatase 12/05/2021 102  46 - 116 U/L Final    PSA 12/05/2021 1.44  0.00 - 4.00 ng/mL Final    WBC 12/05/2021 7.4  3.6 - 11.2 10*9/L Final    RBC 12/05/2021 4.18 (L)  4.26 - 5.60 10*12/L Final    HGB 12/05/2021 10.6 (L)  12.9 - 16.5 g/dL Final    HCT 84/69/6295 32.3 (L)  39.0 - 48.0 % Final    MCV 12/05/2021 77.3 (L)  77.6 - 95.7 fL Final    MCH 12/05/2021 25.4 (L)  25.9 - 32.4 pg Final    MCHC 12/05/2021 32.9  32.0 - 36.0 g/dL Final    RDW 28/41/3244 20.4 (H)  12.2 - 15.2 % Final    MPV 12/05/2021 7.0  6.8 - 10.7 fL Final Platelet 12/05/2021 495 (H)  150 - 450 10*9/L Final    Neutrophils % 12/05/2021 60.1  % Final    Lymphocytes % 12/05/2021 26.4  % Final    Monocytes % 12/05/2021 12.5  % Final    Eosinophils % 12/05/2021 0.6  % Final    Basophils % 12/05/2021 0.4  % Final    Absolute Neutrophils 12/05/2021 4.4  1.8 - 7.8 10*9/L Final    Absolute Lymphocytes 12/05/2021 1.9  1.1 - 3.6 10*9/L Final    Absolute Monocytes 12/05/2021 0.9 (H)  0.3 - 0.8 10*9/L Final    Absolute Eosinophils 12/05/2021 0.0  0.0 - 0.5 10*9/L Final    Absolute Basophils 12/05/2021 0.0  0.0 - 0.1 10*9/L Final    Microcytosis 12/05/2021 Slight (A)  Not Present Final    Anisocytosis 12/05/2021 Moderate (A)  Not Present Final    Hypochromasia 12/05/2021 Moderate (A)  Not Present Final

## 2021-12-19 ENCOUNTER — Encounter: Payer: Self-pay | Admitting: Emergency Medicine

## 2021-12-19 ENCOUNTER — Other Ambulatory Visit: Payer: Self-pay

## 2021-12-19 ENCOUNTER — Emergency Department
Admission: EM | Admit: 2021-12-19 | Discharge: 2021-12-19 | Discharge status: Against Medical Advice | Payer: Medicaid Other | Attending: Emergency Medicine | Admitting: Emergency Medicine

## 2021-12-19 DIAGNOSIS — I10 Essential (primary) hypertension: Secondary | ICD-10-CM | POA: Diagnosis not present

## 2021-12-19 DIAGNOSIS — R1110 Vomiting, unspecified: Secondary | ICD-10-CM | POA: Diagnosis not present

## 2021-12-19 DIAGNOSIS — R109 Unspecified abdominal pain: Secondary | ICD-10-CM | POA: Insufficient documentation

## 2021-12-19 DIAGNOSIS — Z8546 Personal history of malignant neoplasm of prostate: Secondary | ICD-10-CM | POA: Insufficient documentation

## 2021-12-19 DIAGNOSIS — R1084 Generalized abdominal pain: Secondary | ICD-10-CM

## 2021-12-19 DIAGNOSIS — Z5329 Procedure and treatment not carried out because of patient's decision for other reasons: Secondary | ICD-10-CM | POA: Insufficient documentation

## 2021-12-19 DIAGNOSIS — E876 Hypokalemia: Secondary | ICD-10-CM | POA: Insufficient documentation

## 2021-12-19 LAB — COMPREHENSIVE METABOLIC PANEL
ALT: 10 U/L (ref 0–44)
AST: 18 U/L (ref 15–41)
Albumin: 3.9 g/dL (ref 3.5–5.0)
Alkaline Phosphatase: 75 U/L (ref 38–126)
Anion gap: 10 (ref 5–15)
BUN: <5 mg/dL — ABNORMAL LOW (ref 8–23)
CO2: 32 mmol/L (ref 22–32)
Calcium: 9.4 mg/dL (ref 8.9–10.3)
Chloride: 91 mmol/L — ABNORMAL LOW (ref 98–111)
Creatinine, Ser: 0.78 mg/dL (ref 0.61–1.24)
GFR, Estimated: >60 mL/min (ref >60)
Glucose, Bld: 199 mg/dL — ABNORMAL HIGH (ref 70–99)
Potassium: 2.5 mmol/L — CL (ref 3.5–5.1)
Sodium: 133 mmol/L — ABNORMAL LOW (ref 135–145)
Total Bilirubin: 1.1 mg/dL (ref 0.3–1.2)
Total Protein: 8.1 g/dL (ref 6.5–8.1)

## 2021-12-19 LAB — MAGNESIUM: Magnesium: 1.9 mg/dL (ref 1.7–2.4)

## 2021-12-19 LAB — CBC WITH DIFFERENTIAL/PLATELET
Abs Immature Granulocytes: 0.72 10*3/uL — ABNORMAL HIGH (ref 0.00–0.07)
Basophils Absolute: 0.1 10*3/uL (ref 0.0–0.1)
Basophils Relative: 1 %
Eosinophils Absolute: 0 10*3/uL (ref 0.0–0.5)
Eosinophils Relative: 0 %
HCT: 33 % — ABNORMAL LOW (ref 39.0–52.0)
Hemoglobin: 10.8 g/dL — ABNORMAL LOW (ref 13.0–17.0)
Immature Granulocytes: 5 %
Lymphocytes Relative: 20 %
Lymphs Abs: 3.1 10*3/uL (ref 0.7–4.0)
MCH: 24.2 pg — ABNORMAL LOW (ref 26.0–34.0)
MCHC: 32.7 g/dL (ref 30.0–36.0)
MCV: 74 fL — ABNORMAL LOW (ref 80.0–100.0)
Monocytes Absolute: 2.9 10*3/uL — ABNORMAL HIGH (ref 0.1–1.0)
Monocytes Relative: 19 %
Neutro Abs: 8.3 10*3/uL — ABNORMAL HIGH (ref 1.7–7.7)
Neutrophils Relative %: 55 %
Platelets: 650 10*3/uL — ABNORMAL HIGH (ref 150–400)
RBC: 4.46 MIL/uL (ref 4.22–5.81)
RDW: 18.1 % — ABNORMAL HIGH (ref 11.5–15.5)
WBC: 15.1 10*3/uL — ABNORMAL HIGH (ref 4.0–10.5)
nRBC: 0.7 % — ABNORMAL HIGH (ref 0.0–0.2)

## 2021-12-19 LAB — LIPASE, BLOOD: Lipase: 31 U/L (ref 11–51)

## 2021-12-19 MED ORDER — POTASSIUM CHLORIDE CRYS ER 20 MEQ PO TBCR
40.0000 meq | EXTENDED_RELEASE_TABLET | Freq: Once | ORAL | Status: AC
Start: 2021-12-19 — End: 2021-12-19
  Administered 2021-12-19: 40 meq via ORAL
  Filled 2021-12-19: qty 2

## 2021-12-19 NOTE — ED Notes (Signed)
Pt not in room ,

## 2021-12-19 NOTE — ED Notes (Signed)
Cant locate patient ed rn charge sam aware , pt not in lobby or barthroom or halls

## 2021-12-19 NOTE — ED Provider Notes (Signed)
[facility] Provider Note    Event Date/Time   First MD Initiated Contact with Patient 12/19/21 0801     (approximate)   History   Abdominal Pain   HPI  Louis Gardner is a 61 y.o. male   Hx Prostate CA on chemotherapy, depression, hypertension, prior GI bleeding. Presents today with 2 days intermittent abdominal pain w chills. No fever.  BM 2 days ago, +flatus, no blood. No urinary symptoms.  He did vomit this morning but no blood in the emesis.  Denies alcohol use.   5:03 PM Patient eloped prior to completing work up.  Searched premises and cannot locate.      Physical Exam   Triage Vital Signs: ED Triage Vitals  Enc Vitals Group     BP 12/19/21 0424 (!) 137/100     Pulse Rate 12/19/21 0424 86     Resp 12/19/21 0424 18     Temp 12/19/21 0424 98.6 F (37 C)     Temp Source 12/19/21 0424 Oral     SpO2 12/19/21 0424 99 %     Weight 12/19/21 0416 155 lb (70.3 kg)     Height 12/19/21 0416 '5\' 9"'$  (1.753 m)     Head Circumference --      Peak Flow --      Pain Score 12/19/21 0416 6     Pain Loc --      Pain Edu? --      Excl. in [place]? --     Most recent vital signs: Vitals:   12/19/21 0424 12/19/21 0808  BP: (!) 137/100 (!) 135/98  Pulse: 86 85  Resp: 18 17  Temp: 98.6 F (37 C) 98.6 F (37 C)  SpO2: 99% 96%     General: Awake, no distress.  Toxic appearance.  Appears comfortable. CV:  Good peripheral perfusion.  Resp:  Normal effort.  Abd:  No distention.,  He is tender to palpation left greater than right side without rigidity or guarding.    ED Results / Procedures / Treatments   Labs (all labs ordered are listed, but only abnormal results are displayed) Labs Reviewed  CBC WITH DIFFERENTIAL/PLATELET - Abnormal; Notable for the following components:      Result Value   WBC 15.1 (*)    Hemoglobin 10.8 (*)    HCT 33.0 (*)    MCV 74.0 (*)    MCH 24.2 (*)    RDW 18.1 (*)    Platelets 650 (*)    nRBC 0.7 (*)     Neutro Abs 8.3 (*)    Monocytes Absolute 2.9 (*)    Abs Immature Granulocytes 0.72 (*)    All other components within normal limits  COMPREHENSIVE METABOLIC PANEL - Abnormal; Notable for the following components:   Sodium 133 (*)    Potassium 2.5 (*)    Chloride 91 (*)    Glucose, Bld 199 (*)    BUN <5 (*)    All other components within normal limits  LIPASE, BLOOD  MAGNESIUM     EKG ED ECG REPORT I, Lucillie Garfinkel, the attending physician, personally viewed and interpreted this ECG.   Date: 12/19/2021  EKG Time: 0414  Rate: 87  Rhythm: normal sinus rhythm, prolonged QT interval  Axis: Normal  Intervals: QT prolongation  ST&T Change: No ischemic changes     PROCEDURES:  Critical Care performed: No  Procedures   MEDICATIONS ORDERED IN ED: Medications  potassium chloride SA (KLOR-CON M)  CR tablet 40 mEq (40 mEq Oral Given 12/19/21 0751)     IMPRESSION / MDM / ASSESSMENT AND PLAN / ED COURSE  I reviewed the triage vital signs and the nursing notes.                              Differential diagnosis includes, but is not limited to intra-abdominal infection, gastritis, cholecystitis, progression of neoplastic disease, urinary tract infection  Patient's presentation is most consistent with acute presentation with potential threat to life or bodily function.  This patient is immunocompromised with abdominal pain and chills concerning for intra-abdominal infection other emergent intra-abdominal pathology.  Plan for labs, infectious work-up CT scan of the abdomen pelvis with IV contrast.  I spoke with the patient about this plan he understands agrees.  He was found to be hypokalemic with prolonged QT, and ordered for potassium repletion.  However, before work-up was completed the patient had eloped.  The premises was searched and patient was nowhere to be found.  I spoke to the patient earlier during his initial evaluation he understood the work-up and concerns for acute  life-threatening illness, and appeared to have capacity to make decisions.  Clinical Course as of 12/19/21 1703  Wed Dec 19, 2021  0809 EKG 12-Lead [SW]    Clinical Course User Index [SW] Lucillie Garfinkel, MD     FINAL CLINICAL IMPRESSION(S) / ED DIAGNOSES   Final diagnoses:  None     Rx / DC Orders   ED Discharge Orders     None        Note:  This document was prepared using Dragon voice recognition software and may include unintentional dictation errors.    Lucillie Garfinkel, MD 12/19/21 [phone]

## 2021-12-19 NOTE — ED Notes (Signed)
Still cant locate patient ,

## 2021-12-19 NOTE — ED Notes (Signed)
Attempted to call pt and unable to get through. Looked for pt in the ER and lobby along with bathrooms. Unable to find pt. First RN Caryl Pina states she thinks she saw pt leave and thought it was just discharged .

## 2021-12-19 NOTE — ED Triage Notes (Signed)
Patient ambulatory to triage with steady gait, without difficulty or distress noted; pt reports mid abd pain since yesterday accomp by N/V

## 2021-12-21 ENCOUNTER — Encounter: Payer: Self-pay | Admitting: Physician Assistant

## 2021-12-21 ENCOUNTER — Ambulatory Visit (INDEPENDENT_AMBULATORY_CARE_PROVIDER_SITE_OTHER): Payer: Medicaid Other | Admitting: Physician Assistant

## 2021-12-21 VITALS — BP 126/78 | HR 96 | Ht 69.0 in | Wt 157.0 lb

## 2021-12-21 DIAGNOSIS — R339 Retention of urine, unspecified: Secondary | ICD-10-CM | POA: Diagnosis not present

## 2021-12-21 LAB — BLADDER SCAN AMB NON-IMAGING

## 2021-12-21 NOTE — Progress Notes (Signed)
12/21/2021 10:00 AM   Louis Gardner [birth year] [id number]  CC: Chief Complaint  Patient presents with   Follow-up    28mh follow-up   HPI: Louis Gardner on ADT, darolutamide, and docetaxel [facility] of [facility] who passed a voiding trial with me last month who presents today for him to recheck and repeat PVR.   Today he reports no difficulty urinating in the past month.  No acute concerns today.  [facility] with plans for follow-up every 6 months with repeat scans.  PVR 0 mL.  PMH: Past Medical History:  Diagnosis Date   Arthritis    Depression    GI bleeding    Hypertension    Pancreatic Gardner ([place]    Sleep apnea     Surgical History: Past Surgical History:  Procedure Laterality Date   COLONOSCOPY     ESOPHAGOGASTRODUODENOSCOPY (EGD) WITH PROPOFOL N/A 03/19/2016   Procedure: ESOPHAGOGASTRODUODENOSCOPY (EGD) WITH PROPOFOL;  Surgeon: MLollie Sails MD;  [facility];  Service: Endoscopy;  Laterality: N/A;    Home Medications:  Allergies as of 12/21/2021   No Known Allergies      Medication List        Accurate as of December 21, 2021 10:00 AM. If you have any questions, ask your nurse or doctor.          amLODipine 10 MG tablet Commonly known as: NORVASC Take 10 mg by mouth daily.   buPROPion 150 MG 12 hr tablet Commonly known as: WELLBUTRIN SR Take 150 mg by mouth 2 (two) times daily.   Calcium Carb-Cholecalciferol 600-20 MG-MCG Tabs Take by mouth.   hydrochlorothiazide 12.5 MG tablet Commonly known as: HYDRODIURIL Take 12.5 mg by mouth daily.   pantoprazole 40 MG tablet Commonly known as: PROTONIX Take 40 mg by mouth daily.   pravastatin 20 MG tablet Commonly known as: PRAVACHOL        Allergies:  No Known Allergies  Family History: No family  history on file.  Social History:   reports that he has been smoking cigarettes. He has been exposed to tobacco smoke. He has never used smokeless tobacco. He reports current alcohol use of about 6.0 standard drinks of alcohol per week. He reports that he does not use drugs.  Physical Exam: BP 126/78   Pulse 96   Ht '5\' 9"'$  (1.753 m)   Wt 157 lb (71.2 kg)   BMI 23.18 kg/m   Constitutional:  Alert and oriented, no acute distress, nontoxic appearing HEENT: [place], AT Cardiovascular: No clubbing, cyanosis, or edema Respiratory: Normal respiratory effort, no increased work of breathing Skin: No rashes, bruises or suspicious lesions Neurologic: Grossly intact, no focal deficits, moving all 4 extremities Psychiatric: Normal mood and affect  Laboratory Data: Results for orders placed or performed in visit on 12/21/21  BLADDER SCAN AMB NON-IMAGING  Result Value Ref Range   Scan Result 030m   Assessment & Plan:   1. Urinary retention He continues to empty appropriately.  [facility] At this point, okay to follow-up with urology as needed.  [facility] the [facility]  He expressed understanding. - BLADDER SCAN AMB NON-IMAGING  Return if symptoms worsen or  fail to improve.  Debroah Loop, PA-C  [facility] [address], [place], [place] [address] [phone]

## 2021-12-21 NOTE — Unmapped (Signed)
Called patient has his friend Jola Babinski to remind them of appts for patient for Monday 12/24/21.  They will be able to make appts.

## 2021-12-24 ENCOUNTER — Ambulatory Visit: Admit: 2021-12-24 | Discharge: 2021-12-24 | Payer: PRIVATE HEALTH INSURANCE | Attending: Family | Primary: Family

## 2021-12-24 ENCOUNTER — Ambulatory Visit: Admit: 2021-12-24 | Discharge: 2021-12-24 | Payer: PRIVATE HEALTH INSURANCE

## 2021-12-24 ENCOUNTER — Other Ambulatory Visit: Admit: 2021-12-24 | Discharge: 2021-12-24 | Payer: PRIVATE HEALTH INSURANCE

## 2021-12-24 DIAGNOSIS — D649 Anemia, unspecified: Principal | ICD-10-CM

## 2021-12-24 DIAGNOSIS — C775 Secondary and unspecified malignant neoplasm of intrapelvic lymph nodes: Principal | ICD-10-CM

## 2021-12-24 DIAGNOSIS — C61 Malignant neoplasm of prostate: Principal | ICD-10-CM

## 2021-12-24 LAB — CBC W/ AUTO DIFF
BASOPHILS ABSOLUTE COUNT: 0.1 10*9/L (ref 0.0–0.1)
BASOPHILS RELATIVE PERCENT: 0.8 %
EOSINOPHILS ABSOLUTE COUNT: 0 10*9/L (ref 0.0–0.5)
EOSINOPHILS RELATIVE PERCENT: 0.3 %
HEMATOCRIT: 29.3 % — ABNORMAL LOW (ref 39.0–48.0)
HEMOGLOBIN: 9.8 g/dL — ABNORMAL LOW (ref 12.9–16.5)
LYMPHOCYTES ABSOLUTE COUNT: 2.6 10*9/L (ref 1.1–3.6)
LYMPHOCYTES RELATIVE PERCENT: 28.2 %
MEAN CORPUSCULAR HEMOGLOBIN CONC: 33.5 g/dL (ref 32.0–36.0)
MEAN CORPUSCULAR HEMOGLOBIN: 25.3 pg — ABNORMAL LOW (ref 25.9–32.4)
MEAN CORPUSCULAR VOLUME: 75.6 fL — ABNORMAL LOW (ref 77.6–95.7)
MEAN PLATELET VOLUME: 6.9 fL (ref 6.8–10.7)
MONOCYTES ABSOLUTE COUNT: 1.2 10*9/L — ABNORMAL HIGH (ref 0.3–0.8)
MONOCYTES RELATIVE PERCENT: 13.1 %
NEUTROPHILS ABSOLUTE COUNT: 5.4 10*9/L (ref 1.8–7.8)
NEUTROPHILS RELATIVE PERCENT: 57.6 %
PLATELET COUNT: 705 10*9/L — ABNORMAL HIGH (ref 150–450)
RED BLOOD CELL COUNT: 3.88 10*12/L — ABNORMAL LOW (ref 4.26–5.60)
RED CELL DISTRIBUTION WIDTH: 20.1 % — ABNORMAL HIGH (ref 12.2–15.2)
WBC ADJUSTED: 9.3 10*9/L (ref 3.6–11.2)

## 2021-12-24 LAB — FERRITIN: FERRITIN: 453 ng/mL — ABNORMAL HIGH

## 2021-12-24 LAB — COMPREHENSIVE METABOLIC PANEL
ALBUMIN: 3.2 g/dL — ABNORMAL LOW (ref 3.4–5.0)
ALKALINE PHOSPHATASE: 83 U/L (ref 46–116)
ALT (SGPT): <7 U/L — ABNORMAL LOW (ref 10–49)
ANION GAP: 6 mmol/L (ref 5–14)
AST (SGOT): 12 U/L (ref <=34)
BILIRUBIN TOTAL: 0.4 mg/dL (ref 0.3–1.2)
BLOOD UREA NITROGEN: 8 mg/dL — ABNORMAL LOW (ref 9–23)
BUN / CREAT RATIO: 10
CALCIUM: 9.6 mg/dL (ref 8.7–10.4)
CHLORIDE: 98 mmol/L (ref 98–107)
CO2: 32 mmol/L — ABNORMAL HIGH (ref 20.0–31.0)
CREATININE: 0.82 mg/dL
EGFR CKD-EPI (2021) MALE: >90 mL/min/{1.73_m2} (ref >=60)
GLUCOSE RANDOM: 137 mg/dL (ref 70–179)
POTASSIUM: 4.2 mmol/L (ref 3.5–5.1)
PROTEIN TOTAL: 7 g/dL (ref 5.7–8.2)
SODIUM: 136 mmol/L (ref 135–145)

## 2021-12-24 LAB — PSA: PROSTATE SPECIFIC ANTIGEN: 1.35 ng/mL (ref 0.00–4.00)

## 2021-12-24 MED ADMIN — sodium chloride (NS) 0.9 % infusion: 100 mL/h | INTRAVENOUS | @ 21:00:00

## 2021-12-24 MED ADMIN — dexamethasone (DECADRON) 4 mg/mL injection 20 mg: 20 mg | INTRAVENOUS | @ 20:00:00 | Stop: 2021-12-24

## 2021-12-24 MED ADMIN — famotidine (PEPCID) tablet 20 mg: 20 mg | ORAL | @ 20:00:00 | Stop: 2021-12-24

## 2021-12-24 MED ADMIN — DOCEtaxel (TAXOTERE) 133.5 mg in sodium chloride NON-PVC (NS) 0.9 % 250 mL IVPB: 75 mg/m2 | INTRAVENOUS | @ 21:00:00 | Stop: 2021-12-24

## 2021-12-24 NOTE — Unmapped (Signed)
VENOUS ACCESS TEAM PROCEDURE    Order was placed for a PIV by Venous Access Team (VAT).  Patient was assessed at bedside for placement of a PIV. PPE were donned per protocol.  Access was obtained. Blood return noted.  Dressing intact and device well secured.  Flushed with normal saline.  See LDA for details.  Pt advised to inform RN of any s/s of discomfort at the PIV site.    Workup / Procedure Time:  30 minutes       care RN was notified.       Thank you,     Franchot Gallo RN Venous Access Team

## 2021-12-24 NOTE — Unmapped (Addendum)
[facility] Medical Oncology Visit Note    Patient Name: Mark Stephenson  Patient Age: 61 y.o.  Encounter Date: 12/24/2021  Attending Provider:  Laurette Schimke. 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 M with HTN, chronic pancreatitis and high volume mHSPC who will be treated per the ARASENS trial. He is tolerating chemo well with no worsening of numbness in his feet.        Recommendations  1) Prostate cancer -   Return to clinic: Return in 4-5 weeks with PMSA PET, and visit to review    Imaging:  - PSMA-PET scan with widely metastatic disease noted at baseline    Genetics:  - [facility] tumor genetic testing reveals APC and TP53 mutations, MSS  - Germline genetic testing with no germline mutations detected    Treatment:  - ADT: ADT started 07/30/21, Eligard 45mg  received 4/24, next due 02/25/22  - ARSI: started darolutamide per ARASENS trial in mid April 2023  - chemotherapy: docetaxel chemotherapy 75mg /m2 started 08/27/21, due for cycle 6/6 today       - 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  - local urologist to replace foley every 4 weeks  - hold off on TURP to see if ADT + chemotherapy can shrink prostate    3) Bone health  - DEXA scan with osteopenia (Frax 2.3%), repeat in Feb 2024  - continue calcium 800mg  daily + vitamin D 1200 international units daily    4) Metabolic health  - recommended daily exercise and heart-healthy diet for metabolic health while on ADT  - nutrition following    5) Anemia: multifactorial (related to chemotherapy and chronic disease)_  - Ferritin and plts elevated   - No interventions at this time, will monitor, and should correct off chemo    Reason for Visit  The patient is seen in consultation at the request of Aycock, Ngwe Achirimofo* for evaluation of prostate cancer.    History of Present Illness:  Oncology History Overview Note   03/19/21 - CT A/P with contrast performed to follow up on pancreatic mass, with near resolution of pancreatic uncinate process. Bulky multistation abdominopelvic lymphadenopathy identified (e.g. left para-aortic node measuring 2.1cm and right common iliac node measuring 1.4cm). No concerning osseous lesions identified. Enlarged, heterogeneously enhancing prostate gland.  04/17/21 - PSA 48.83  05/29/21 - retroperitoneal lymph node core needle biopsy, pathology consistent with metastatic prostate adenocarcinoma.  06/27/21 - PSMA-PET scan with widely metastatic osseous disease as well as avid adenopathy extending from the pelvis to the left supraclavicular region consistent with metastatic prostate cancer. Noting that there is one single enlarged retroperitoneal node with low/minimal tracer avidity measuring up to 4 cm.  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)     06/04/2021 -  Cancer Staged    Staging form: Prostate, AJCC 8th Edition  - Clinical: Stage IVB (cT1c, cN1, cM1a, PSA: 35) - Signed by Davy Pique, MD on 06/04/2021       08/26/2021 -  Chemotherapy    OP PROSTATE DOCETAXEL/PREDNISONE  Docetaxel 75 mg/m2 evry 21 days     08/27/2021 Endocrine/Hormone Therapy    OP PROSTATE LEUPROLIDE (ELIGARD) 45 MG EVERY 6 MONTHS  Plan Provider: Davy Pique, MD     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 Mild fatigue.  No fevers, chills, worsening numbness/tingling, nail/skin changes. No swelling in LE.     Allergies:  No Known Allergies    Current Medications:    Current Outpatient Medications:     amLODIPine (NORVASC) 10 MG tablet, Take 1 tablet (10 mg total) by mouth daily., Disp: 90 tablet, Rfl: 0    buPROPion (WELLBUTRIN SR) 150 MG 12 hr tablet, Take 1 tablet (150 mg total) by mouth., Disp: , Rfl:     calcium carbonate-vitamin D3 600 mg-20 mcg (800 unit) Tab, Take 1 tablet by mouth two (2) times a day., Disp: 60 tablet, Rfl: 11    darolutamide (NUBEQA) 300 mg tablet, Take 2 tablets (600 mg total) by mouth in the morning and 2 tablets (600 mg total) in the evening. Take with food. Swallow tablets whole., Disp: 120 tablet, Rfl: 11    dexAMETHasone (DECADRON) 4 MG tablet, Take 8 mg (2 tablets) 12 hours before and 3 hours before each chemotherapy treatment., Disp: 12 tablet, Rfl: 2    hydroCHLOROthiazide (HYDRODIURIL) 25 MG tablet, Take 1 tablet (25 mg total) by mouth daily., Disp: 90 tablet, Rfl: 0    levoFLOXacin (LEVAQUIN) 500 MG tablet, , Disp: , Rfl:     pantoprazole (PROTONIX) 40 MG tablet, Take 1 tablet (40 mg total) by mouth., Disp: , Rfl:     pravastatin (PRAVACHOL) 20 MG tablet, Take 1 tablet (20 mg total) by mouth daily., Disp: 90 tablet, Rfl: 0    prochlorperazine (COMPAZINE) 10 MG tablet, Take 1 tablet (10 mg total) by mouth every six (6) hours as needed for nausea., Disp: 30 tablet, Rfl: 2    sulfamethoxazole-trimethoprim (BACTRIM DS) 800-160 mg per tablet, TAKE 1 TABLET BY MOUTH TWICE DAILY FOR 3 DAYS, Disp: , Rfl: tamsulosin (FLOMAX) 0.4 mg capsule, , Disp: , Rfl:     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)        Past Surgical History:   Procedure Laterality Date    CHG X-RAY FOR BILE DUCT  ENDOSCOPY  12/08/2019    Procedure: ENDOSCOPIC CATHETERIZATION OF THE BILIARY DUCTAL SYSTEM, RADIOLOGICAL SUPERVISION AND INTERPRETATION;  Surgeon: Chriss Driver, MD;  [facility];  Service: Gastroenterology    PR ERCP,SPHINCTEROTOMY N/A 12/08/2019    Procedure: ERCP; W/SPHINCTEROTOMY/PAPILLOTOMY;  Surgeon: Chriss Driver, MD;  [facility]: [facility];  Service: Gastroenterology    PR UPGI ENDOSCOPY W/US FN BX N/A 12/08/2019    Procedure: UGI W/ TRANSENDOSCOPIC ULTRASOUND GUIDED INTRAMURAL/TRANSMURAL FINE NEEDLE ASPIRATION/BIOPSY(S), ESOPHAGUS;  Surgeon: Chriss Driver, MD;  Location: [facility];  Service: Gastroenterology        Social History     Occupational History    Occupation: disability   Tobacco Use    Smoking status: Every Day    Smokeless tobacco: Never    Tobacco comments:     1 pack per week. reports gradually.    Vaping Use    Vaping Use: Never used   Substance and Sexual Activity    Alcohol use: Yes     Alcohol/week: 21.0 standard drinks of alcohol     Types: 21 Cans of beer per week     Comment: 3x 40-once cans of beer per day    Drug use: No    Sexual activity: Not on file       Family History  Family History   Problem Relation Age of Onset    Cancer Father     Cancer Brother     Anesthesia problems Neg Hx      Prostate Cancer Family History Assessment:  History of cancer in children (yes/no; if yes, what type AND age of diagnosis): no children  Total number of siblings (including deceased): 4 brothers, 2 sisters  History of cancer in siblings (yes/no; if yes, provide relation, type of cancer, AND age of diagnosis): oldest brother had unknown type of cancer  History of cancer in parents (yes/no; if yes, please specify parent, type of cancer, AND age of diagnosis): mother - unknown, father passed from unknown cancer  History of cancer in aunts/uncles/grandparents (yes/no; if yes, provide relation, type of cancer, AND age of diagnosis): none known      Review of Systems:  A comprehensive review of 10 systems was negative except for pertinent positives noted in HPI.    Physical Exam:  VITAL SIGNS:  Temp 36.6 ??C (97.9 ??F) (Oral)  - Resp 16  - Ht 175.3 cm (5' 9)  - Wt 70.5 kg (155 lb 6.4 oz)  - SpO2 100%  - BMI 22.95 kg/m??   ECOG Performance Status: 0  GENERAL: Well-developed, well-nourished patient in no acute distress.  HEAD: Normocephalic and atraumatic.  EYES: Conjunctivae are normal. No scleral icterus.  MOUTH/THROAT: Oropharynx is clear and moist.  No mucosal lesions. Poor dentition, with tooth on the bottom right decaying.  NECK: Supple, no thyromegaly.  CARDIOVASCULAR: Normal rate, regular rhythm and normal heart sounds.  Exam reveals no gallop and no friction rub.  No murmur heard.  PULMONARY/CHEST: Effort normal and breath sounds normal. No respiratory distress.  ABDOMINAL:  Soft. There is no distension. There is no tenderness. There is no rebound and no guarding.  MUSCULOSKELETAL: No clubbing, cyanosis, or lower extremity edema.  PSYCHIATRIC: Alert and oriented.  Normal mood and affect.  NEUROLOGIC: No focal motor deficit. Normal gait.  SKIN: Skin is warm, dry, and intact.      Results/Orders:    No visits with results within 2 Week(s) from this visit.  Latest known visit with results is:   Lab on 12/05/2021   Component Date Value Ref Range Status    Sodium 12/05/2021 138  135 - 145 mmol/L Final    Potassium 12/05/2021 4.8  3.5 - 5.1 mmol/L Final    Chloride 12/05/2021 103  98 - 107 mmol/L Final    CO2 12/05/2021 25.0  20.0 - 31.0 mmol/L Final    Anion Gap 12/05/2021 10  5 - 14 mmol/L Final    BUN 12/05/2021 11  9 - 23 mg/dL Final    Creatinine 45/40/9811 0.68  0.60 - 1.10 mg/dL Final    BUN/Creatinine Ratio 12/05/2021 16   Final    eGFR CKD-EPI (2021) Male 12/05/2021 >90  >=60 mL/min/1.58m2 Final    eGFR calculated with CKD-EPI 2021 equation in accordance with [employer] and [employer] of Nephrology Task Force recommendations.    Glucose 12/05/2021 80  70 - 179 mg/dL Final    Calcium 91/47/8295 9.4  8.7 - 10.4 mg/dL Final    Albumin 62/13/0865 3.5  3.4 - 5.0 g/dL Final    Total Protein 12/05/2021 7.3  5.7 - 8.2 g/dL Final    Total Bilirubin 12/05/2021 0.5  0.3 - 1.2 mg/dL Final    AST 78/46/9629 21  <=34 U/L Final    ALT 12/05/2021 11  10 - 49 U/L Final    Alkaline Phosphatase 12/05/2021 102  46 - 116 U/L Final    PSA 12/05/2021 1.44  0.00 - 4.00 ng/mL Final    WBC 12/05/2021 7.4  3.6 - 11.2 10*9/L Final    RBC 12/05/2021 4.18 (L)  4.26 - 5.60 10*12/L Final    HGB 12/05/2021 10.6 (L)  12.9 - 16.5 g/dL Final    HCT 52/84/1324 32.3 (L)  39.0 - 48.0 % Final    MCV 12/05/2021 77.3 (L)  77.6 - 95.7 fL Final    MCH 12/05/2021 25.4 (L)  25.9 - 32.4 pg Final    MCHC 12/05/2021 32.9  32.0 - 36.0 g/dL Final    RDW 40/02/2724 20.4 (H)  12.2 - 15.2 % Final    MPV 12/05/2021 7.0  6.8 - 10.7 fL Final    Platelet 12/05/2021 495 (H)  150 - 450 10*9/L Final    Neutrophils % 12/05/2021 60.1  % Final    Lymphocytes % 12/05/2021 26.4  % Final    Monocytes % 12/05/2021 12.5  % Final    Eosinophils % 12/05/2021 0.6  % Final    Basophils % 12/05/2021 0.4  % Final    Absolute Neutrophils 12/05/2021 4.4  1.8 - 7.8 10*9/L Final    Absolute Lymphocytes 12/05/2021 1.9  1.1 - 3.6 10*9/L Final    Absolute Monocytes 12/05/2021 0.9 (H)  0.3 - 0.8 10*9/L Final    Absolute Eosinophils 12/05/2021 0.0  0.0 - 0.5 10*9/L Final    Absolute Basophils 12/05/2021 0.0  0.0 - 0.1 10*9/L Final    Microcytosis 12/05/2021 Slight (A)  Not Present Final    Anisocytosis 12/05/2021 Moderate (A)  Not Present Final    Hypochromasia 12/05/2021 Moderate (A)  Not Present Final       Lab Results   Component Value Date    PSA 1.44 12/05/2021    PSA 2.67 11/12/2021    PSA 4.78 (H) 10/22/2021    PSA 21.05 (H) 09/17/2021    PSA 64.37 (H) 08/27/2021    PSA 406.39 (H) 07/30/2021    PSA 48.83 (H) 04/17/2021  Orders placed or performed in visit on 12/24/21    Clinic Appointment Request Physician

## 2021-12-24 NOTE — Unmapped (Addendum)
RTC in 4 weeks with labs, PET imaging review, and visit with Dr. Hyacinth Meeker

## 2021-12-25 NOTE — Unmapped (Signed)
Lab on 12/24/2021   Component Date Value Ref Range Status    PSA 12/24/2021 1.35  0.00 - 4.00 ng/mL Final    Sodium 12/24/2021 136  135 - 145 mmol/L Final    Potassium 12/24/2021 4.2  3.5 - 5.1 mmol/L Final    Chloride 12/24/2021 98  98 - 107 mmol/L Final    CO2 12/24/2021 32.0 (H)  20.0 - 31.0 mmol/L Final    Anion Gap 12/24/2021 6  5 - 14 mmol/L Final    BUN 12/24/2021 8 (L)  9 - 23 mg/dL Final    Creatinine 16/02/9603 0.82  0.60 - 1.10 mg/dL Final    BUN/Creatinine Ratio 12/24/2021 10   Final    eGFR CKD-EPI (2021) Male 12/24/2021 >90  >=60 mL/min/1.14m2 Final    eGFR calculated with CKD-EPI 2021 equation in accordance with [employer] and [employer] of Nephrology Task Force recommendations.    Glucose 12/24/2021 137  70 - 179 mg/dL Final    Calcium 54/01/8118 9.6  8.7 - 10.4 mg/dL Final    Albumin 14/78/2956 3.2 (L)  3.4 - 5.0 g/dL Final    Total Protein 12/24/2021 7.0  5.7 - 8.2 g/dL Final    Total Bilirubin 12/24/2021 0.4  0.3 - 1.2 mg/dL Final    AST 21/30/8657 12  <=34 U/L Final    ALT 12/24/2021 <7 (L)  10 - 49 U/L Final    Alkaline Phosphatase 12/24/2021 83  46 - 116 U/L Final    WBC 12/24/2021 9.3  3.6 - 11.2 10*9/L Final    RBC 12/24/2021 3.88 (L)  4.26 - 5.60 10*12/L Final    HGB 12/24/2021 9.8 (L)  12.9 - 16.5 g/dL Final    HCT 84/69/6295 29.3 (L)  39.0 - 48.0 % Final    MCV 12/24/2021 75.6 (L)  77.6 - 95.7 fL Final    MCH 12/24/2021 25.3 (L)  25.9 - 32.4 pg Final    MCHC 12/24/2021 33.5  32.0 - 36.0 g/dL Final    RDW 28/41/3244 20.1 (H)  12.2 - 15.2 % Final    MPV 12/24/2021 6.9  6.8 - 10.7 fL Final    Platelet 12/24/2021 705 (H)  150 - 450 10*9/L Final    Neutrophils % 12/24/2021 57.6  % Final    Lymphocytes % 12/24/2021 28.2  % Final    Monocytes % 12/24/2021 13.1  % Final    Eosinophils % 12/24/2021 0.3  % Final    Basophils % 12/24/2021 0.8  % Final    Absolute Neutrophils 12/24/2021 5.4  1.8 - 7.8 10*9/L Final    Absolute Lymphocytes 12/24/2021 2.6  1.1 - 3.6 10*9/L Final    Absolute Monocytes 12/24/2021 1.2 (H)  0.3 - 0.8 10*9/L Final    Absolute Eosinophils 12/24/2021 0.0  0.0 - 0.5 10*9/L Final    Absolute Basophils 12/24/2021 0.1  0.0 - 0.1 10*9/L Final    Microcytosis 12/24/2021 Slight (A)  Not Present Final    Anisocytosis 12/24/2021 Moderate (A)  Not Present Final    Hypochromasia 12/24/2021 Moderate (A)  Not Present Final    Ferritin 12/24/2021 453.0 (H)  10.5 - 307.3 ng/mL Final

## 2021-12-25 NOTE — Unmapped (Signed)
Patient arrived to chair 45 to receive C6D1 Docetaxel. Labs within parameter for tx today. PIV was flushed, + BR. Premeds given. Pt. Completed and tolerated tx well. PIV was flushed, +BR. PIV was removed and patient discharged with no additional needs, AVS provided.   APP notified plt count [phone], no need orders.

## 2021-12-26 NOTE — Unmapped (Signed)
[facility] Refill Coordination Note    Specialty Medication(s) to be Shipped:   Hematology/Oncology: Nubeqa    Other medication(s) to be shipped: No additional medications requested for fill at this time     Mark Stephenson, DOB: Oct 13, 1960  Phone: [phone] (home)       All above HIPAA information was verified with patient.     Was a Nurse, learning disability used for this call? No    Completed refill call assessment today to schedule patient's medication shipment from the [facility] [phone]).  All relevant notes have been reviewed.     Specialty medication(s) and dose(s) confirmed: Regimen is correct and unchanged.   Changes to medications: [name] reports no changes at this time.  Changes to insurance: No  New side effects reported not previously addressed with a pharmacist or physician: None reported  Questions for the pharmacist: No    Confirmed patient received a Conservation officer, historic buildings and a Surveyor, mining with first shipment. The patient will receive a drug information handout for each medication shipped and additional FDA Medication Guides as required.       DISEASE/MEDICATION-SPECIFIC INFORMATION        N/A    SPECIALTY MEDICATION ADHERENCE     Medication Adherence    Patient reported X missed doses in the last month: 0  Specialty Medication: Nubeqa 300mg   Patient is on additional specialty medications: No  Informant: patient                       Were doses missed due to medication being on hold? No    Nubeqa 300 mg: 14 days of medicine on hand       REFERRAL TO PHARMACIST     Referral to the pharmacist: Not needed      [facility]     Shipping address confirmed in Epic.     Delivery Scheduled: Yes, Expected medication delivery date: 01/04/22.     Medication will be delivered via Next Day Courier to the prescription address in Epic [place].    [name]   [facility] Specialty Technician

## 2022-01-03 MED FILL — NUBEQA 300 MG TABLET: ORAL | 30 days supply | Qty: 120 | Fill #4

## 2022-01-14 ENCOUNTER — Ambulatory Visit: Admit: 2022-01-14 | Discharge: 2022-01-15 | Payer: PRIVATE HEALTH INSURANCE

## 2022-01-14 DIAGNOSIS — C61 Malignant neoplasm of prostate: Principal | ICD-10-CM

## 2022-01-14 DIAGNOSIS — C772 Secondary and unspecified malignant neoplasm of intra-abdominal lymph nodes: Principal | ICD-10-CM

## 2022-01-14 MED ADMIN — F-18 Piflufolastat (Pylarify): 9.5 | INTRAVENOUS | @ 16:00:00 | Stop: 2022-01-14

## 2022-01-15 NOTE — Unmapped (Signed)
Advised by Ardean Larsen, NP to call patient and advised him that his CT showed a possibility of a blocked bile duct.  Informed patient that we will set him up for an [place] to confirm, however if he starts having serve pain, fever, nausea, vomiting to go to the emergency room.  Patient understood information.

## 2022-01-22 NOTE — Unmapped (Signed)
Please confirm with pt gf the time of appt. That was needed for [facility] asap ultrasound       Thanks,  Raegine

## 2022-01-23 NOTE — Unmapped (Signed)
Mark Stephenson has been contacted in regards to their refill of Nubeqa. At this time, they have declined refill due to  pt asked for [place] to call back in 2 weeks . Refill assessment call date has been updated per the patient's request.

## 2022-01-24 ENCOUNTER — Ambulatory Visit: Admit: 2022-01-24 | Payer: PRIVATE HEALTH INSURANCE

## 2022-01-28 ENCOUNTER — Ambulatory Visit: Admit: 2022-01-28 | Discharge: 2022-01-29 | Payer: PRIVATE HEALTH INSURANCE | Attending: Family | Primary: Family

## 2022-01-28 ENCOUNTER — Other Ambulatory Visit: Admit: 2022-01-28 | Discharge: 2022-01-29 | Payer: PRIVATE HEALTH INSURANCE

## 2022-01-28 ENCOUNTER — Ambulatory Visit: Admit: 2022-01-28 | Discharge: 2022-01-29 | Payer: PRIVATE HEALTH INSURANCE

## 2022-01-28 DIAGNOSIS — C775 Secondary and unspecified malignant neoplasm of intrapelvic lymph nodes: Principal | ICD-10-CM

## 2022-01-28 DIAGNOSIS — C61 Malignant neoplasm of prostate: Principal | ICD-10-CM

## 2022-01-28 DIAGNOSIS — D649 Anemia, unspecified: Principal | ICD-10-CM

## 2022-01-28 DIAGNOSIS — K8000 Calculus of gallbladder with acute cholecystitis without obstruction: Principal | ICD-10-CM

## 2022-01-28 HISTORY — DX: Calculus of gallbladder with acute cholecystitis without obstruction: K80.00

## 2022-01-28 LAB — CBC W/ AUTO DIFF
BASOPHILS ABSOLUTE COUNT: 0 10*9/L (ref 0.0–0.1)
BASOPHILS RELATIVE PERCENT: 0.8 %
EOSINOPHILS ABSOLUTE COUNT: 0.3 10*9/L (ref 0.0–0.5)
EOSINOPHILS RELATIVE PERCENT: 4.3 %
HEMATOCRIT: 30.8 % — ABNORMAL LOW (ref 39.0–48.0)
HEMOGLOBIN: 10.1 g/dL — ABNORMAL LOW (ref 12.9–16.5)
LYMPHOCYTES ABSOLUTE COUNT: 2 10*9/L (ref 1.1–3.6)
LYMPHOCYTES RELATIVE PERCENT: 33.3 %
MEAN CORPUSCULAR HEMOGLOBIN CONC: 32.9 g/dL (ref 32.0–36.0)
MEAN CORPUSCULAR HEMOGLOBIN: 25.7 pg — ABNORMAL LOW (ref 25.9–32.4)
MEAN CORPUSCULAR VOLUME: 78 fL (ref 77.6–95.7)
MEAN PLATELET VOLUME: 7.2 fL (ref 6.8–10.7)
MONOCYTES ABSOLUTE COUNT: 1 10*9/L — ABNORMAL HIGH (ref 0.3–0.8)
MONOCYTES RELATIVE PERCENT: 16.7 %
NEUTROPHILS ABSOLUTE COUNT: 2.7 10*9/L (ref 1.8–7.8)
NEUTROPHILS RELATIVE PERCENT: 44.9 %
PLATELET COUNT: 428 10*9/L (ref 150–450)
RED BLOOD CELL COUNT: 3.95 10*12/L — ABNORMAL LOW (ref 4.26–5.60)
RED CELL DISTRIBUTION WIDTH: 25.8 % — ABNORMAL HIGH (ref 12.2–15.2)
WBC ADJUSTED: 6.1 10*9/L (ref 3.6–11.2)

## 2022-01-28 LAB — BASIC METABOLIC PANEL
ANION GAP: 8 mmol/L (ref 5–14)
BLOOD UREA NITROGEN: 8 mg/dL — ABNORMAL LOW (ref 9–23)
BUN / CREAT RATIO: 10
CALCIUM: 9.3 mg/dL (ref 8.7–10.4)
CHLORIDE: 104 mmol/L (ref 98–107)
CO2: 28 mmol/L (ref 20.0–31.0)
CREATININE: 0.8 mg/dL
EGFR CKD-EPI (2021) MALE: >90 mL/min/{1.73_m2} (ref >=60)
GLUCOSE RANDOM: 138 mg/dL (ref 70–179)
POTASSIUM: 4.1 mmol/L (ref 3.5–5.1)
SODIUM: 140 mmol/L (ref 135–145)

## 2022-01-28 LAB — HEPATIC FUNCTION PANEL
ALBUMIN: 3.4 g/dL (ref 3.4–5.0)
ALKALINE PHOSPHATASE: 87 U/L (ref 46–116)
ALT (SGPT): 8 U/L — ABNORMAL LOW (ref 10–49)
AST (SGOT): 14 U/L (ref <=34)
BILIRUBIN DIRECT: 0.4 mg/dL — ABNORMAL HIGH (ref 0.00–0.30)
BILIRUBIN TOTAL: 1.1 mg/dL (ref 0.3–1.2)
PROTEIN TOTAL: 6.9 g/dL (ref 5.7–8.2)

## 2022-01-28 LAB — SLIDE REVIEW

## 2022-01-28 LAB — PSA: PROSTATE SPECIFIC ANTIGEN: 0.5 ng/mL (ref 0.00–4.00)

## 2022-01-28 LAB — IRON PANEL
IRON SATURATION: 22 % (ref 20–55)
IRON: 63 ug/dL — ABNORMAL LOW
TOTAL IRON BINDING CAPACITY: 286 ug/dL (ref 250–425)

## 2022-01-28 MED ORDER — PRAVASTATIN 20 MG TABLET
ORAL_TABLET | Freq: Every day | ORAL | 0 refills | 90 days | Status: CP
Start: 2022-01-28 — End: ?

## 2022-01-28 MED ORDER — HYDROCHLOROTHIAZIDE 25 MG TABLET
ORAL_TABLET | Freq: Every day | ORAL | 0 refills | 90 days | Status: CP
Start: 2022-01-28 — End: ?

## 2022-01-28 MED ORDER — AMLODIPINE 10 MG TABLET
ORAL_TABLET | Freq: Every day | ORAL | 0 refills | 90 days | Status: CP
Start: 2022-01-28 — End: ?

## 2022-01-28 NOTE — Unmapped (Signed)
[facility] Visit Note    Patient Name: Mark Stephenson  Patient Age: 61 y.o.  Encounter Date: 01/28/2022  Attending Provider:  Laurette Schimke. 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 M with HTN, chronic pancreatitis and high volume mHSPC who will be treated per the ARASENS trial. He is tolerating chemo well with no worsening of numbness in his feet.      Recommendations  1) Prostate cancer - PSA with nice response to treatment (0.5 today 01/28/2022)    Imaging:  - PSMA-PET scan with widely metastatic disease noted at baseline. Now with repeat PET s/p chemo completion, reporting great response to treatment, however there was not of a distended gallbladder with ?? Obstruction. 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     3) Bone health  - DEXA scan with osteopenia (Frax 2.3%), repeat in Feb 2024  - continue calcium 800mg  daily + vitamin D 1200 international units daily    4) Metabolic health  - recommended daily exercise and heart-healthy diet for metabolic health while on ADT  - nutrition following    5) Anemia: multifactorial (related to chemotherapy and chronic disease  - Ferritin and plts elevated   - No interventions at this time, will monitor, and should correct off chemo    6) Gallstones: visualized on PET imaging  -- [place] scheduled to eval; will follow up  -- may require intervention if obstruction is noted.    RTC on or after 10/24 with labs, visit, eligard. Will follow up gallbladder US and refer to GI if needed.     Reason for Visit  The patient is seen in consultation at the request of Aycock, Ngwe Achirimofo* for evaluation of prostate cancer.    History of Present Illness:  Oncology History Overview Note   03/19/21 - CT A/P with contrast performed to follow up on pancreatic mass, with near resolution of pancreatic uncinate process. Bulky multistation abdominopelvic lymphadenopathy identified (e.g. left para-aortic node measuring 2.1cm and right common iliac node measuring 1.4cm). No concerning osseous lesions identified. Enlarged, heterogeneously enhancing prostate gland.  04/17/21 - PSA 48.83  05/29/21 - retroperitoneal lymph node core needle biopsy, pathology consistent with  metastatic prostate adenocarcinoma.  06/27/21 - PSMA-PET scan with widely metastatic osseous disease as well as avid adenopathy extending from the pelvis to the left supraclavicular region consistent with metastatic prostate cancer. Noting that there is one single enlarged retroperitoneal node with low/minimal tracer avidity measuring up to 4 cm.  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)     06/04/2021 -  Cancer Staged    Staging form: Prostate, AJCC 8th Edition  - Clinical: Stage IVB (cT1c, cN1, cM1a, PSA: 58) - Signed by Davy Pique, MD on 06/04/2021       08/26/2021 -  Chemotherapy    OP PROSTATE DOCETAXEL/PREDNISONE  Docetaxel 75 mg/m2 evry 21 days     08/27/2021 Endocrine/Hormone Therapy    OP PROSTATE LEUPROLIDE (ELIGARD) 45 MG EVERY 6 MONTHS  Plan Provider: Davy Pique, MD     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 Mild fatigue. No fevers, chills, worsening numbness/tingling, nail/skin changes. No swelling in LE. Denies abdominal pain. No issues with constipation or diarrhea. No issues with urination. Eating well. Wt stable. BP stable. Denies PN.     Allergies:  No Known Allergies    Current Medications:    Current Outpatient Medications:     buPROPion (WELLBUTRIN SR) 150 MG 12 hr tablet, Take 1 tablet (150 mg total) by mouth., Disp: , Rfl:     calcium carbonate-vitamin D3 600 mg-20 mcg (800 unit) Tab, Take 1 tablet by mouth two (2) times a day., Disp: 60 tablet, Rfl: 11    darolutamide (NUBEQA) 300 mg tablet, Take 2 tablets (600 mg total) by mouth in the morning and 2 tablets (600 mg total) in the evening. Take with food. Swallow tablets whole., Disp: 120 tablet, Rfl: 11    dexAMETHasone (DECADRON) 4 MG tablet, Take 8 mg (2 tablets) 12 hours before and 3 hours before each chemotherapy treatment., Disp: 12 tablet, Rfl: 2    levoFLOXacin (LEVAQUIN) 500 MG tablet, , Disp: , Rfl:     pantoprazole (PROTONIX) 40 MG tablet, Take 1 tablet (40 mg total) by mouth., Disp: , Rfl: prochlorperazine (COMPAZINE) 10 MG tablet, Take 1 tablet (10 mg total) by mouth every six (6) hours as needed for nausea., Disp: 30 tablet, Rfl: 2    sulfamethoxazole-trimethoprim (BACTRIM DS) 800-160 mg per tablet, TAKE 1 TABLET BY MOUTH TWICE DAILY FOR 3 DAYS, Disp: , Rfl:     tamsulosin (FLOMAX) 0.4 mg capsule, , Disp: , Rfl:     amLODIPine (NORVASC) 10 MG tablet, Take 1 tablet (10 mg total) by mouth daily., Disp: 90 tablet, Rfl: 0    hydroCHLOROthiazide (HYDRODIURIL) 25 MG tablet, Take 1 tablet (25 mg total) by mouth daily., Disp: 90 tablet, Rfl: 0    pravastatin (PRAVACHOL) 20 MG tablet, Take 1 tablet (20 mg total) by mouth daily., Disp: 90 tablet, Rfl: 0    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)    Anemia of chronic disease    Gallstones and inflammation of gallbladder without obstruction        Past Surgical History:   Procedure Laterality Date    CHG X-RAY FOR BILE DUCT ENDOSCOPY  12/08/2019    Procedure: ENDOSCOPIC CATHETERIZATION OF THE BILIARY DUCTAL SYSTEM, RADIOLOGICAL SUPERVISION AND INTERPRETATION;  Surgeon: Chriss Driver, MD;  [facility];  Service: Gastroenterology    PR ERCP,SPHINCTEROTOMY N/A 12/08/2019    Procedure: ERCP; W/SPHINCTEROTOMY/PAPILLOTOMY;  Surgeon: Chriss Driver, MD;  Location: [facility];  Service: Gastroenterology    PR UPGI ENDOSCOPY W/US FN BX N/A 12/08/2019    Procedure: UGI W/ TRANSENDOSCOPIC ULTRASOUND GUIDED INTRAMURAL/TRANSMURAL FINE NEEDLE ASPIRATION/BIOPSY(S), ESOPHAGUS;  Surgeon: Chriss Driver, MD;  Location: [facility];  Service: Gastroenterology        Social History     Occupational History    Occupation: disability   Tobacco Use    Smoking status: Every Day    Smokeless tobacco: Never    Tobacco comments:     1 pack per week. reports gradually.    Vaping Use    Vaping Use: Never used   Substance and Sexual Activity    Alcohol use: Yes     Alcohol/week: 21.0 standard drinks of alcohol     Types: 21 Cans of beer per week     Comment: 3x 40-once cans of beer per day    Drug use: No    Sexual activity: Not on file       Family History  Family History   Problem Relation Age of Onset    Cancer Father     Cancer Brother     Anesthesia problems Neg Hx      Prostate Cancer Family History Assessment:  History of cancer in children (yes/no; if yes, what type AND age of diagnosis): no children  Total number of siblings (including deceased): 4 brothers, 2 sisters  History of cancer in siblings (yes/no; if yes, provide relation, type of cancer, AND age of diagnosis): oldest brother had unknown type of cancer  History of cancer in parents (yes/no; if yes, please specify parent, type of cancer, AND age of diagnosis): mother - unknown, father passed from unknown cancer  History of cancer in aunts/uncles/grandparents (yes/no; if yes, provide relation, type of cancer, AND age of diagnosis): none known      Review of Systems:  A comprehensive review of 10 systems was negative except for pertinent positives noted in HPI.    Physical Exam:  VITAL SIGNS:  BP 131/80  - Pulse 75  - Temp 37.2 ??C (98.9 ??F) (Oral)  - Resp 18  - Ht 175.3 cm (5' 9)  - Wt 71.6 kg (157 lb 14.4 oz)  - SpO2 98%  - BMI 23.32 kg/m??   ECOG Performance Status: 0  GENERAL: Well-developed, well-nourished patient in no acute distress.  HEAD: Normocephalic and atraumatic.  EYES: Conjunctivae are normal. No scleral icterus.  MOUTH/THROAT: Oropharynx is clear and moist.  No mucosal lesions. Poor dentition, with  tooth on the bottom right decaying.  NECK: Supple, no thyromegaly.  CARDIOVASCULAR: Normal rate, regular rhythm and normal heart sounds.  Exam reveals no gallop and no friction rub.  No murmur heard.  PULMONARY/CHEST: Effort normal and breath sounds normal. No respiratory distress.  ABDOMINAL:  Soft. There is no distension. There is no tenderness. There is no rebound and no guarding.  MUSCULOSKELETAL: No clubbing, cyanosis, or lower extremity edema.  PSYCHIATRIC: Alert and oriented.  Normal mood and affect.  NEUROLOGIC: No focal motor deficit. Normal gait.  SKIN: Skin is warm, dry, and intact.      Results/Orders:    No visits with results within 2 Week(s) from this visit.   Latest known visit with results is:   Lab on 12/24/2021   Component Date Value Ref Range Status    PSA 12/24/2021 1.35  0.00 - 4.00 ng/mL Final    Sodium 12/24/2021 136  135 - 145 mmol/L Final    Potassium 12/24/2021 4.2  3.5 - 5.1 mmol/L Final    Chloride 12/24/2021 98  98 - 107 mmol/L Final    CO2 12/24/2021 32.0 (H)  20.0 - 31.0 mmol/L Final    Anion Gap 12/24/2021 6  5 - 14 mmol/L Final    BUN 12/24/2021 8 (L)  9 - 23 mg/dL Final    Creatinine 16/02/9603 0.82  0.60 - 1.10 mg/dL Final    BUN/Creatinine Ratio 12/24/2021 10   Final    eGFR CKD-EPI (2021) Male 12/24/2021 >90  >=60 mL/min/1.55m2 Final    eGFR calculated with CKD-EPI 2021 equation in accordance with [employer] and [employer] of Nephrology Task Force recommendations.    Glucose 12/24/2021 137  70 - 179 mg/dL Final    Calcium 54/01/8118 9.6  8.7 - 10.4 mg/dL Final    Albumin 14/78/2956 3.2 (L)  3.4 - 5.0 g/dL Final    Total Protein 12/24/2021 7.0  5.7 - 8.2 g/dL Final    Total Bilirubin 12/24/2021 0.4  0.3 - 1.2 mg/dL Final    AST 21/30/8657 12  <=34 U/L Final    ALT 12/24/2021 <7 (L)  10 - 49 U/L Final    Alkaline Phosphatase 12/24/2021 83  46 - 116 U/L Final    WBC 12/24/2021 9.3  3.6 - 11.2 10*9/L Final    RBC 12/24/2021 3.88 (L)  4.26 - 5.60 10*12/L Final    HGB 12/24/2021 9.8 (L)  12.9 - 16.5 g/dL Final    HCT 84/69/6295 29.3 (L)  39.0 - 48.0 % Final    MCV 12/24/2021 75.6 (L)  77.6 - 95.7 fL Final    MCH 12/24/2021 25.3 (L)  25.9 - 32.4 pg Final    MCHC 12/24/2021 33.5  32.0 - 36.0 g/dL Final    RDW 28/41/3244 20.1 (H)  12.2 - 15.2 % Final    MPV 12/24/2021 6.9  6.8 - 10.7 fL Final    Platelet 12/24/2021 705 (H)  150 - 450 10*9/L Final    Neutrophils % 12/24/2021 57.6  % Final    Lymphocytes % 12/24/2021 28.2  % Final    Monocytes % 12/24/2021 13.1  % Final    Eosinophils % 12/24/2021 0.3  % Final    Basophils % 12/24/2021 0.8  % Final    Absolute Neutrophils 12/24/2021 5.4  1.8 - 7.8 10*9/L Final    Absolute Lymphocytes 12/24/2021 2.6  1.1 - 3.6 10*9/L Final    Absolute Monocytes 12/24/2021 1.2 (H)  0.3 - 0.8 10*9/L Final  Absolute Eosinophils 12/24/2021 0.0  0.0 - 0.5 10*9/L Final    Absolute Basophils 12/24/2021 0.1  0.0 - 0.1 10*9/L Final    Microcytosis 12/24/2021 Slight (A)  Not Present Final    Anisocytosis 12/24/2021 Moderate (A)  Not Present Final    Hypochromasia 12/24/2021 Moderate (A)  Not Present Final    Ferritin 12/24/2021 453.0 (H)  10.5 - 307.3 ng/mL Final       Lab Results   Component Value Date    PSA 1.35 12/24/2021    PSA 1.44 12/05/2021    PSA 2.67 11/12/2021    PSA 4.78 (H) 10/22/2021    PSA 21.05 (H) 09/17/2021    PSA 64.37 (H) 08/27/2021    PSA 406.39 (H) 07/30/2021    PSA 48.83 (H) 04/17/2021       Orders placed or performed in visit on 01/28/22    Clinic Appointment Request Physician    Clinic Appointment Request Physician

## 2022-01-28 NOTE — Unmapped (Signed)
Patient Mark Stephenson was called in attempt number 1 to reach them regarding scheduling their upcoming appointments on 03/11/22.     The call resulted in: Message left    If patient returns call - inform him of date/time of all visits scheduled on 11/6. (Ultrasound scheduling was pending when he left the clinic.)        Thank you,    Carly R Sheffield Slider

## 2022-01-28 NOTE — Unmapped (Signed)
RTC in 6 weeks with labs and visit and eligard injection       Will plan to reschedule your Gallbladder US to evaluate for gallstones and obstruction

## 2022-02-14 NOTE — Unmapped (Signed)
The [facility] has made a second and final attempt to reach this patient to refill the following medication:Nubeqa.      We have been unable to leave messages on the following phone numbers: [phone] and have sent a text message to the following phone numbers: [phone] .    Dates contacted: 10/4,12  Last scheduled delivery: 01/03/22    The patient may be at risk of non-compliance with this medication. The patient should call the [facility] at [phone]  Option 4, then Option 1 (oncology) to refill medication.    Wyatt Mage Hulda Humphrey   [facility] Specialty Technician

## 2022-02-26 NOTE — Unmapped (Signed)
[facility] Clinical Assessment & Refill Coordination Note    Mark Stephenson, DOB: [birth year]  Phone: [phone] (home) [phone] (work)    All above HIPAA information was verified with patient.     Was a Nurse, learning disability used for this call? No    Specialty Medication(s):   Hematology/Oncology: [name]     Current Outpatient Medications   Medication Sig Dispense Refill    amLODIPine (NORVASC) 10 MG tablet Take 1 tablet (10 mg total) by mouth daily. 90 tablet 0    buPROPion (WELLBUTRIN SR) 150 MG 12 hr tablet Take 1 tablet (150 mg total) by mouth.      calcium carbonate-vitamin D3 600 mg-20 mcg (800 unit) Tab Take 1 tablet by mouth two (2) times a day. 60 tablet 11    darolutamide (NUBEQA) 300 mg tablet Take 2 tablets (600 mg total) by mouth in the morning and 2 tablets (600 mg total) in the evening. Take with food. Swallow tablets whole. 120 tablet 11    dexAMETHasone (DECADRON) 4 MG tablet Take 8 mg (2 tablets) 12 hours before and 3 hours before each chemotherapy treatment. 12 tablet 2    hydroCHLOROthiazide (HYDRODIURIL) 25 MG tablet Take 1 tablet (25 mg total) by mouth daily. 90 tablet 0    levoFLOXacin (LEVAQUIN) 500 MG tablet       pantoprazole (PROTONIX) 40 MG tablet Take 1 tablet (40 mg total) by mouth.      pravastatin (PRAVACHOL) 20 MG tablet Take 1 tablet (20 mg total) by mouth daily. 90 tablet 0    prochlorperazine (COMPAZINE) 10 MG tablet Take 1 tablet (10 mg total) by mouth every six (6) hours as needed for nausea. 30 tablet 2    sulfamethoxazole-trimethoprim (BACTRIM DS) 800-160 mg per tablet TAKE 1 TABLET BY MOUTH TWICE DAILY FOR 3 DAYS      tamsulosin (FLOMAX) 0.4 mg capsule        No current facility-administered medications for this visit.        Changes to medications: Deaveon reports no changes at this time.    No Known Allergies    Changes to allergies: No    SPECIALTY MEDICATION ADHERENCE     Nubeqa 300 mg: 14 days of medicine on hand     Medication Adherence Patient reported X missed doses in the last month: 0  Specialty Medication: Nubeqa 300mg  - 2 tabs (600 mg) twice daily  Patient is on additional specialty medications: No  Informant: patient                  Confirmed plan for next specialty medication refill: delivery by pharmacy  Refills needed for supportive medications: not needed          Specialty medication(s) dose(s) confirmed: Regimen is correct and unchanged.     Are there any concerns with adherence?  Zavery denies missed doses.  [facility] last dispenseed Nubeqa on 01/03/22 x 30 day supply    Adherence counseling provided?  Declined    CLINICAL MANAGEMENT AND INTERVENTION      Clinical Benefit Assessment:    Do you feel the medicine is effective or helping your condition? Yes    Clinical Benefit counseling provided? Not needed    Adverse Effects Assessment:    Are you experiencing any side effects? Yes, patient reports experiencing occasional hot flashes. Side effect counseling provided: NA -manageable at this time    Are you experiencing difficulty administering your medicine? No  Quality of Life Assessment:    Quality of Life    Rheumatology  Oncology  1. What impact has your specialty medication had on the reduction of your daily pain or discomfort level?: None  2. On a scale of 1-10, how would you rate your ability to manage side effects associated with your specialty medication? (1=no issues, 10 = unable to take medication due to side effects): 2  Dermatology  Cystic Fibrosis          How many days over the past month did your prostate cancer  keep you from your normal activities? For example, brushing your teeth or getting up in the morning. 0    Have you discussed this with your provider? Not needed    Acute Infection Status:    Acute infections noted within Epic:  No active infections  Patient reported infection: None    Therapy Appropriateness:    Is therapy appropriate and patient progressing towards therapeutic goals? Yes, therapy is appropriate and should be continued    DISEASE/MEDICATION-SPECIFIC INFORMATION      N/A    Oncology: Is the patient receiving adequate infection prevention treatment? Not applicable  Does the patient have adequate nutritional support? Not applicable    PATIENT SPECIFIC NEEDS     Does the patient have any physical, cognitive, or cultural barriers? No    Is the patient high risk? No    Did the patient require a clinical intervention? No    Does the patient require physician intervention or other additional services (i.e., nutrition, smoking cessation, social work)? No    SOCIAL DETERMINANTS OF HEALTH     At the [facility], we have learned that life circumstances - like trouble affording food, housing, utilities, or transportation can affect the health of many of our patients.   That is why we wanted to ask: are you currently experiencing any life circumstances that are negatively impacting your health and/or quality of life? Patient declined to answer    Social Determinants of Health     Financial Resource Strain: Not on file   Internet Connectivity: Not on file   Food Insecurity: Not on file   Tobacco Use: High Risk (01/28/2022)    Patient History     Smoking Tobacco Use: Every Day     Smokeless Tobacco Use: Never     Passive Exposure: Not on file   Housing/Utilities: Not on file   Alcohol Use: Not on file   Transportation Needs: Not on file   Substance Use: Not on file   Health Literacy: Not on file   Physical Activity: Not on file   Interpersonal Safety: Not on file   Stress: Not on file   Intimate Partner Violence: Not on file   Depression: Not on file   Social Connections: Not on file       Would you be willing to receive help with any of the needs that you have identified today? Not applicable       SHIPPING     Specialty Medication(s) to be Shipped:   Hematology/Oncology: Nubeqa    Other medication(s) to be shipped: No additional medications requested for fill at this time     Changes to insurance: No    Delivery Scheduled: Yes, Expected medication delivery date: 03/08/22.     Medication will be delivered via Next Day Courier to the confirmed prescription address in [facility].    The patient will receive a drug information handout for each medication  shipped and additional FDA Medication Guides as required.  Verified that patient has previously received a Conservation officer, historic buildings and a Surveyor, mining.    The patient or caregiver noted above participated in the development of this care plan and knows that they can request review of or adjustments to the care plan at any time.      All of the patient's questions and concerns have been addressed.    Kermit Balo, [facility]   [facility] Specialty Pharmacist

## 2022-03-04 ENCOUNTER — Ambulatory Visit: Admit: 2022-03-04 | Payer: PRIVATE HEALTH INSURANCE

## 2022-03-04 ENCOUNTER — Ambulatory Visit: Admit: 2022-03-04 | Payer: PRIVATE HEALTH INSURANCE | Attending: Family | Primary: Family

## 2022-03-07 MED FILL — NUBEQA 300 MG TABLET: ORAL | 30 days supply | Qty: 120 | Fill #5

## 2022-03-08 NOTE — Unmapped (Signed)
Lujean Rave with Medicaid manage care contacted the Communication Center regarding the following:    - States that she is calling to discuss if an [place] and lab and provider appt is going to be rescheduled and if the [place] is needed    Please contact Clydie Braun at [phone].    Thanks in advance,    Rosary Lively  [facility]   [phone]

## 2022-03-12 DIAGNOSIS — C775 Secondary and unspecified malignant neoplasm of intrapelvic lymph nodes: Principal | ICD-10-CM

## 2022-03-12 DIAGNOSIS — C61 Malignant neoplasm of prostate: Principal | ICD-10-CM

## 2022-03-21 NOTE — Unmapped (Signed)
I spoke with patient Mark Stephenson to confirm appointments on the following date(s): 12/06 and 12/07    [name]

## 2022-03-22 NOTE — Unmapped (Signed)
-----   Message from [facility] sent at 03/21/2022 10:05 AM EST -----  Pt has been scheduled.    Thanks,  Alexus  ----- Message -----  From: Marica Otter, RN  Sent: 03/21/2022   9:30 AM EST  To: Marica Otter, RN; #    Hi, can we get pt rescheduled- orders are in.  Thanks,  [place]

## 2022-04-05 NOTE — Unmapped (Signed)
[facility] Refill Coordination Note    Specialty Medication(s) to be Shipped:   Hematology/Oncology: Nubeqa    Other medication(s) to be shipped: No additional medications requested for fill at this time     Mark Stephenson, DOB: [birth year]  Phone: [phone] (work)      All above HIPAA information was verified with patient's family member, Spouse.     Was a Nurse, learning disability used for this call? No    Completed refill call assessment today to schedule patient's medication shipment from the [facility] [phone]).  All relevant notes have been reviewed.     Specialty medication(s) and dose(s) confirmed: Regimen is correct and unchanged.   Changes to medications: Cedrick reports no changes at this time.  Changes to insurance: No  New side effects reported not previously addressed with a pharmacist or physician: Yes - Patient reports hot flashes. Patient would like to speak to the pharmacist today. Their provider is not aware.  Questions for the pharmacist: No    Confirmed patient received a Conservation officer, historic buildings and a Surveyor, mining with first shipment. The patient will receive a drug information handout for each medication shipped and additional FDA Medication Guides as required.       DISEASE/MEDICATION-SPECIFIC INFORMATION        N/A    SPECIALTY MEDICATION ADHERENCE     Medication Adherence    Patient reported X missed doses in the last month: 0  Specialty Medication: Nubeqa 300 mg  Patient is on additional specialty medications: No  Informant: spouse                       Were doses missed due to medication being on hold? No    Nubeqa 300 mg: 7 days of medicine on hand       REFERRAL TO PHARMACIST     Referral to the pharmacist: Not needed      [facility]     Shipping address confirmed in Epic.     Delivery Scheduled: Yes, Expected medication delivery date: 04/09/22.     Medication will be delivered via Next Day Courier to the prescription address in Epic [place].    Mark Stephenson M Mark Stephenson   [facility] Specialty Technician

## 2022-04-08 MED FILL — NUBEQA 300 MG TABLET: ORAL | 30 days supply | Qty: 120 | Fill #6

## 2022-04-10 ENCOUNTER — Ambulatory Visit: Admit: 2022-04-10 | Discharge: 2022-04-11 | Payer: PRIVATE HEALTH INSURANCE

## 2022-04-11 ENCOUNTER — Ambulatory Visit: Admit: 2022-04-11 | Payer: PRIVATE HEALTH INSURANCE

## 2022-04-11 ENCOUNTER — Ambulatory Visit: Admit: 2022-04-11 | Payer: PRIVATE HEALTH INSURANCE | Attending: Family | Primary: Family

## 2022-04-23 DIAGNOSIS — C775 Secondary and unspecified malignant neoplasm of intrapelvic lymph nodes: Principal | ICD-10-CM

## 2022-04-23 DIAGNOSIS — C61 Malignant neoplasm of prostate: Principal | ICD-10-CM

## 2022-04-25 ENCOUNTER — Other Ambulatory Visit: Admit: 2022-04-25 | Discharge: 2022-04-26 | Payer: PRIVATE HEALTH INSURANCE

## 2022-04-25 ENCOUNTER — Ambulatory Visit: Admit: 2022-04-25 | Discharge: 2022-04-26 | Payer: PRIVATE HEALTH INSURANCE | Attending: Family | Primary: Family

## 2022-04-25 LAB — HEPATIC FUNCTION PANEL
ALBUMIN: 3.7 g/dL (ref 3.4–5.0)
ALKALINE PHOSPHATASE: 92 U/L (ref 46–116)
ALT (SGPT): 7 U/L — ABNORMAL LOW (ref 10–49)
AST (SGOT): 16 U/L (ref <=34)
BILIRUBIN DIRECT: 0.2 mg/dL (ref 0.00–0.30)
BILIRUBIN TOTAL: 0.6 mg/dL (ref 0.3–1.2)
PROTEIN TOTAL: 7.8 g/dL (ref 5.7–8.2)

## 2022-04-25 LAB — CREATININE
CREATININE: 0.81 mg/dL
EGFR CKD-EPI (2021) MALE: >90 mL/min/{1.73_m2} (ref >=60)

## 2022-04-25 LAB — SODIUM: SODIUM: 137 mmol/L (ref 135–145)

## 2022-04-25 LAB — POTASSIUM: POTASSIUM: 4 mmol/L (ref 3.5–5.1)

## 2022-04-25 LAB — PSA: PROSTATE SPECIFIC ANTIGEN: 5.63 ng/mL — ABNORMAL HIGH (ref 0.00–4.00)

## 2022-04-25 MED ORDER — PRAVASTATIN 20 MG TABLET
ORAL_TABLET | Freq: Every day | ORAL | 0 refills | 90 days | Status: CP
Start: 2022-04-25 — End: ?

## 2022-04-25 MED ORDER — HYDROCHLOROTHIAZIDE 25 MG TABLET
ORAL_TABLET | Freq: Every day | ORAL | 0 refills | 90 days | Status: CP
Start: 2022-04-25 — End: ?

## 2022-04-25 MED ORDER — AMLODIPINE 10 MG TABLET
ORAL_TABLET | Freq: Every day | ORAL | 0 refills | 90 days | Status: CP
Start: 2022-04-25 — End: ?

## 2022-04-25 MED ADMIN — leuprolide acetate (6 month) (ELIGARD) injection 45 mg: 45 mg | SUBCUTANEOUS | @ 16:00:00 | Stop: 2022-04-25

## 2022-04-25 NOTE — Unmapped (Addendum)
[facility] Visit Note    Patient Name: Mark Stephenson  Patient Age: 61 y.o.  Encounter Date: 04/25/2022  Attending Provider:  Laurette Schimke. 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 M with HTN, chronic pancreatitis and high volume mHSPC who will be treated per the ARASENS trial. He is tolerating chemo well with no worsening of numbness in his feet.      Recommendations  1) Prostate cancer - PSA with nice response to treatment     Imaging:  - PSMA-PET scan with widely metastatic disease noted at baseline. Now with repeat PET 01/2022  s/p chemo completion, reporting great response to treatment, however there was not of a distended gallbladder with ?? Obstruction. [place] was negative in findings, LFT's normal, asymptomatic.     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     3) Bone health  - DEXA scan with osteopenia (Frax 2.3%) 06/2021  - continue calcium 800mg  daily + vitamin D 1200 international units daily    4) Metabolic health  - recommended daily exercise and heart-healthy diet for metabolic health while on ADT  - nutrition following    5) Anemia: multifactorial (related to chemotherapy and chronic disease  - Ferritin and plts elevated   - No interventions at this time, will monitor, and should correct off chemo    6) Gallstones: visualized on PET imaging  -- [place] negative in finding  -- Denies abdominal pain, nausea, emesis, etc    RTC in 8 weeks with visit, imaging (PET pylarify), and labs    Reason for Visit  The patient is seen in consultation at the request of Aycock, Ngwe Achirimofo* for evaluation of prostate cancer.    History of Present Illness:  Oncology History Overview Note   03/19/21 - CT A/P with contrast performed to follow up on pancreatic mass, with near resolution of pancreatic uncinate process. Bulky multistation abdominopelvic lymphadenopathy identified (e.g. left para-aortic node measuring 2.1cm and right common iliac node measuring 1.4cm). No concerning osseous lesions identified. Enlarged, heterogeneously enhancing prostate gland.  04/17/21 - PSA 48.83  05/29/21 - retroperitoneal lymph node core needle biopsy, pathology consistent with metastatic prostate adenocarcinoma.  06/27/21 - PSMA-PET scan with widely metastatic osseous disease as well as avid adenopathy extending from the pelvis to  the left supraclavicular region consistent with metastatic prostate cancer. Noting that there is one single enlarged retroperitoneal node with low/minimal tracer avidity measuring up to 4 cm.  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) 06/04/2021 -  Cancer Staged    Staging form: Prostate, AJCC 8th Edition  - Clinical: Stage IVB (cT1c, cN1, cM1a, PSA: 48) - Signed by Davy Pique, MD on 06/04/2021       08/26/2021 - 12/24/2021 Chemotherapy    OP PROSTATE DOCETAXEL/PREDNISONE  Docetaxel 75 mg/m2 evry 21 days     08/27/2021 Endocrine/Hormone Therapy    OP PROSTATE LEUPROLIDE (ELIGARD) 45 MG EVERY 6 MONTHS  Plan Provider: Davy Pique, MD     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 Mild fatigue. No fevers, chills, worsening numbness/tingling, nail/skin changes. No swelling in LE. Denies abdominal pain. No issues with constipation or diarrhea. No issues with urination. Eating well. Wt stable. BP stable. Denies PN. Does have a right hand trigger finger that is new.     Allergies:  No Known Allergies    Current Medications:    Current Outpatient Medications:     amLODIPine (NORVASC) 10 MG tablet, Take 1 tablet (10 mg total) by mouth daily., Disp: 90 tablet, Rfl: 0    buPROPion (WELLBUTRIN SR) 150 MG 12 hr tablet, Take 1 tablet (150 mg total) by mouth., Disp: , Rfl:     calcium carbonate-vitamin D3 600 mg-20 mcg (800 unit) Tab, Take 1 tablet by mouth two (2) times a day., Disp: 60 tablet, Rfl: 11    darolutamide (NUBEQA) 300 mg tablet, Take 2 tablets (600 mg total) by mouth in the morning and 2 tablets (600 mg total) in the evening. Take with food. Swallow tablets whole., Disp: 120 tablet, Rfl: 11    dexAMETHasone (DECADRON) 4 MG tablet, Take 8 mg (2 tablets) 12 hours before and 3 hours before each chemotherapy treatment., Disp: 12 tablet, Rfl: 2    hydroCHLOROthiazide (HYDRODIURIL) 25 MG tablet, Take 1 tablet (25 mg total) by mouth daily., Disp: 90 tablet, Rfl: 0 levoFLOXacin (LEVAQUIN) 500 MG tablet, , Disp: , Rfl:     pantoprazole (PROTONIX) 40 MG tablet, Take 1 tablet (40 mg total) by mouth., Disp: , Rfl:     pravastatin (PRAVACHOL) 20 MG tablet, Take 1 tablet (20 mg total) by mouth daily., Disp: 90 tablet, Rfl: 0    prochlorperazine (COMPAZINE) 10 MG tablet, Take 1 tablet (10 mg total) by mouth every six (6) hours as needed for nausea., Disp: 30 tablet, Rfl: 2    tamsulosin (FLOMAX) 0.4 mg capsule, , Disp: , Rfl:     Past Medical History and Social History  Patient Active Problem List   Diagnosis    Essential hypertension    Non-cardiac chest pain    Chronic bilateral low back pain without sciatica    History of seizures  Leg weakness, bilateral    Low back pain with right-sided sciatica    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)    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)    Anemia of chronic disease    Gallstones and inflammation of gallbladder without obstruction        Past Surgical History:   Procedure Laterality Date    CHG X-RAY FOR BILE DUCT ENDOSCOPY  12/08/2019    Procedure: ENDOSCOPIC CATHETERIZATION OF THE BILIARY DUCTAL SYSTEM, RADIOLOGICAL SUPERVISION AND INTERPRETATION;  Surgeon: Chriss Driver, MD;  Location: [facility];  Service: Gastroenterology    PR ERCP,SPHINCTEROTOMY N/A 12/08/2019    Procedure: ERCP; W/SPHINCTEROTOMY/PAPILLOTOMY;  Surgeon: Chriss Driver, MD;  [facility];  Service: Gastroenterology    PR UPGI ENDOSCOPY W/US FN BX N/A 12/08/2019    Procedure: UGI W/ TRANSENDOSCOPIC ULTRASOUND GUIDED INTRAMURAL/TRANSMURAL FINE NEEDLE ASPIRATION/BIOPSY(S), ESOPHAGUS;  Surgeon: Chriss Driver, MD;  Location: [facility];  Service: Gastroenterology        Social History     Occupational History    Occupation: disability   Tobacco Use    Smoking status: Every Day    Smokeless tobacco: Never    Tobacco comments:     1 pack per week. reports gradually.    Vaping Use    Vaping Use: Never used   Substance and Sexual Activity    Alcohol use: Yes     Alcohol/week: 21.0 standard drinks of alcohol     Types: 21 Cans of beer per week     Comment: 3x 40-once cans of beer per day    Drug use: No    Sexual activity: Not on file       Family History  Family History   Problem Relation Age of Onset    Cancer Father     Cancer Brother     Anesthesia problems Neg Hx      Prostate Cancer Family History Assessment:  History of cancer in children (yes/no; if yes, what type AND age of diagnosis): no children  Total number of siblings (including deceased): 4 brothers, 2 sisters  History of cancer in siblings (yes/no; if yes, provide relation, type of cancer, AND age of diagnosis): oldest brother had unknown type of cancer  History of cancer in parents (yes/no; if yes, please specify parent, type of cancer, AND age of diagnosis): mother - unknown, father passed from unknown cancer  History of cancer in aunts/uncles/grandparents (yes/no; if yes, provide relation, type of cancer, AND age of diagnosis): none known      Review of Systems:  A comprehensive review of 10 systems was negative except for pertinent positives noted in HPI.    Physical Exam:  VITAL SIGNS:  BP 142/96  - Pulse 78  - Temp 36.3 ??C (97.4 ??F)  - Ht 175.3 cm (5' 9)  - Wt 73.2 kg (161 lb 6.4 oz)  - SpO2 100%  - BMI 23.83 kg/m??   ECOG Performance Status: 0  GENERAL: Well-developed, well-nourished patient in no acute distress.  HEAD: Normocephalic and atraumatic.  EYES: Conjunctivae are normal. No scleral icterus.  MOUTH/THROAT: Oropharynx is clear and moist.  No mucosal lesions. Poor dentition, with tooth on the bottom right decaying.  NECK: Supple, no thyromegaly.  CARDIOVASCULAR: Normal rate, regular rhythm and normal heart sounds.  Exam reveals no gallop and no friction rub.  No murmur heard.  PULMONARY/CHEST: Effort normal and breath sounds normal.  No respiratory distress.  ABDOMINAL:  Soft. There is no distension. There is no tenderness. There is no rebound and no guarding.  MUSCULOSKELETAL: No clubbing, cyanosis, or lower extremity edema.  PSYCHIATRIC: Alert and oriented.  Normal mood and affect.  NEUROLOGIC: No focal motor deficit. Normal gait.  SKIN: Skin is warm, dry, and intact.      Results/Orders:    No visits with results within 2 Week(s) from this visit.   Latest known visit with results is:   Lab on 01/28/2022   Component Date Value Ref Range Status    PSA 01/28/2022 0.50  0.00 - 4.00 ng/mL Final    Sodium 01/28/2022 140  135 - 145 mmol/L Final    Potassium 01/28/2022 4.1  3.5 - 5.1 mmol/L Final    Chloride 01/28/2022 104  98 - 107 mmol/L Final    CO2 01/28/2022 28.0  20.0 - 31.0 mmol/L Final    Anion Gap 01/28/2022 8  5 - 14 mmol/L Final    BUN 01/28/2022 8 (L)  9 - 23 mg/dL Final    Creatinine 16/02/9603 0.80  0.60 - 1.10 mg/dL Final    BUN/Creatinine Ratio 01/28/2022 10   Final    eGFR CKD-EPI (2021) Male 01/28/2022 >90  >=60 mL/min/1.72m2 Final    eGFR calculated with CKD-EPI 2021 equation in accordance with [employer] and [employer] of Nephrology Task Force recommendations.    Glucose 01/28/2022 138  70 - 179 mg/dL Final    Calcium 54/01/8118 9.3  8.7 - 10.4 mg/dL Final    Iron 14/78/2956 63 (L)  65 - 175 ug/dL Final    TIBC 21/30/8657 286  250 - 425 ug/dL Final    Iron Saturation (%) 01/28/2022 22  20 - 55 % Final    WBC 01/28/2022 6.1  3.6 - 11.2 10*9/L Final    RBC 01/28/2022 3.95 (L)  4.26 - 5.60 10*12/L Final    HGB 01/28/2022 10.1 (L)  12.9 - 16.5 g/dL Final    HCT 84/69/6295 30.8 (L)  39.0 - 48.0 % Final    MCV 01/28/2022 78.0  77.6 - 95.7 fL Final    MCH 01/28/2022 25.7 (L)  25.9 - 32.4 pg Final    MCHC 01/28/2022 32.9  32.0 - 36.0 g/dL Final    RDW 28/41/3244 25.8 (H)  12.2 - 15.2 % Final    MPV 01/28/2022 7.2  6.8 - 10.7 fL Final    Platelet 01/28/2022 428  150 - 450 10*9/L Final    Neutrophils % 01/28/2022 44.9  % Final    Lymphocytes % 01/28/2022 33.3  % Final Monocytes % 01/28/2022 16.7  % Final    Eosinophils % 01/28/2022 4.3  % Final    Basophils % 01/28/2022 0.8  % Final    Absolute Neutrophils 01/28/2022 2.7  1.8 - 7.8 10*9/L Final    Absolute Lymphocytes 01/28/2022 2.0  1.1 - 3.6 10*9/L Final    Absolute Monocytes 01/28/2022 1.0 (H)  0.3 - 0.8 10*9/L Final    Absolute Eosinophils 01/28/2022 0.3  0.0 - 0.5 10*9/L Final    Absolute Basophils 01/28/2022 0.0  0.0 - 0.1 10*9/L Final    Microcytosis 01/28/2022 Slight (A)  Not Present Final    Anisocytosis 01/28/2022 Marked (A)  Not Present Final    Hypochromasia 01/28/2022 Moderate (A)  Not Present Final    Smear Review Comments 01/28/2022 See Comment (A)  Undefined Final    [id number] Slide reviewed. Irregularly contracted RBCs present.  Ovalocytes 01/28/2022 Moderate (A)  Not Present Final    Schistocytes 01/28/2022 Rare (A)  Not Present Final    Acanthocytes 01/28/2022 Present (A)  Not Present Final    Poikilocytosis 01/28/2022 Moderate (A)  Not Present Final    Albumin 01/28/2022 3.4  3.4 - 5.0 g/dL Final    Total Protein 01/28/2022 6.9  5.7 - 8.2 g/dL Final    Total Bilirubin 01/28/2022 1.1  0.3 - 1.2 mg/dL Final    Bilirubin, Direct 01/28/2022 0.40 (H)  0.00 - 0.30 mg/dL Final    AST 64/40/3474 14  <=34 U/L Final    ALT 01/28/2022 8 (L)  10 - 49 U/L Final    Alkaline Phosphatase 01/28/2022 87  46 - 116 U/L Final       Lab Results   Component Value Date    PSA 0.50 01/28/2022    PSA 1.35 12/24/2021    PSA 1.44 12/05/2021    PSA 2.67 11/12/2021    PSA 4.78 (H) 10/22/2021    PSA 21.05 (H) 09/17/2021    PSA 64.37 (H) 08/27/2021    PSA 406.39 (H) 07/30/2021    PSA 48.83 (H) 04/17/2021       Orders placed or performed in visit on 04/25/22    Sodium    Potassium Level    Creatinine    Hepatic Function Panel    PSA (Prostate Specific Antigen)

## 2022-04-25 NOTE — Unmapped (Signed)
Eligard today     Influenza today     Continue Darolutamide    RTC in 8 weeks with labs and visit

## 2022-04-26 NOTE — Unmapped (Signed)
[facility] Refill Coordination Note    Specialty Medication(s) to be Shipped:   Hematology/Oncology: Nubeqa    Other medication(s) to be shipped: No additional medications requested for fill at this time     BENITO LEVITIN, DOB: 1960-08-22  Phone: [phone] (work)      All above HIPAA information was verified with patient's family member, Spouse.     Was a Nurse, learning disability used for this call? No    Completed refill call assessment today to schedule patient's medication shipment from the [facility] [phone]).  All relevant notes have been reviewed.     Specialty medication(s) and dose(s) confirmed: Regimen is correct and unchanged.   Changes to medications: Zyir reports no changes at this time.  Changes to insurance: No  New side effects reported not previously addressed with a pharmacist or physician: None reported  Questions for the pharmacist: No    Confirmed patient received a Conservation officer, historic buildings and a Surveyor, mining with first shipment. The patient will receive a drug information handout for each medication shipped and additional FDA Medication Guides as required.       DISEASE/MEDICATION-SPECIFIC INFORMATION        N/A    SPECIALTY MEDICATION ADHERENCE     Medication Adherence    Patient reported X missed doses in the last month: 0  Specialty Medication: Nubeqa 300 mg  Patient is on additional specialty medications: No  Informant: spouse                       Were doses missed due to medication being on hold? No    Nubeqa 300 mg: 14 days of medicine on hand       REFERRAL TO PHARMACIST     Referral to the pharmacist: Not needed      [facility]     Shipping address confirmed in Epic.     Delivery Scheduled: Yes, Expected medication delivery date: 05/04/22.     Medication will be delivered via Next Day Courier to the prescription address in [employer] [place].    [name]   [facility] Specialty Technician

## 2022-05-03 MED FILL — NUBEQA 300 MG TABLET: ORAL | 30 days supply | Qty: 120 | Fill #7

## 2022-05-30 NOTE — Unmapped (Signed)
[facility] Refill Coordination Note    Specialty Medication(s) to be Shipped:   Hematology/Oncology: Nubeqa    Other medication(s) to be shipped: No additional medications requested for fill at this time     ARCANGELO ALLUMS, DOB: 09/12/1960  Phone: [phone] (work)      All above HIPAA information was verified with patient.     Was a Nurse, learning disability used for this call? No    Completed refill call assessment today to schedule patient's medication shipment from the [facility] [phone]).  All relevant notes have been reviewed.     Specialty medication(s) and dose(s) confirmed: Regimen is correct and unchanged.   Changes to medications: Akxel reports no changes at this time.  Changes to insurance: No  New side effects reported not previously addressed with a pharmacist or physician: None reported  Questions for the pharmacist: No    Confirmed patient received a Conservation officer, historic buildings and a Surveyor, mining with first shipment. The patient will receive a drug information handout for each medication shipped and additional FDA Medication Guides as required.       DISEASE/MEDICATION-SPECIFIC INFORMATION        N/A    SPECIALTY MEDICATION ADHERENCE     Medication Adherence    Patient reported X missed doses in the last month: 0  Specialty Medication: Nubeqa 300 mg  Patient is on additional specialty medications: No  Informant: patient                       Were doses missed due to medication being on hold? No    Nubeqa 300 mg: 14 days of medicine on hand       REFERRAL TO PHARMACIST     Referral to the pharmacist: Not needed      [facility]     Shipping address confirmed in Epic.     Delivery Scheduled: Yes, Expected medication delivery date: 06/08/22.     Medication will be delivered via Next Day Courier to the prescription address in Epic [place].    [name]   [facility] Specialty Technician

## 2022-06-07 MED FILL — NUBEQA 300 MG TABLET: ORAL | 30 days supply | Qty: 120 | Fill #8

## 2022-06-26 ENCOUNTER — Ambulatory Visit: Admit: 2022-06-26 | Discharge: 2022-06-27 | Payer: PRIVATE HEALTH INSURANCE | Attending: Family | Primary: Family

## 2022-06-26 ENCOUNTER — Other Ambulatory Visit: Admit: 2022-06-26 | Discharge: 2022-06-27 | Payer: PRIVATE HEALTH INSURANCE

## 2022-06-26 DIAGNOSIS — C61 Malignant neoplasm of prostate: Principal | ICD-10-CM

## 2022-06-26 DIAGNOSIS — C775 Secondary and unspecified malignant neoplasm of intrapelvic lymph nodes: Principal | ICD-10-CM

## 2022-06-26 LAB — COMPREHENSIVE METABOLIC PANEL
ALBUMIN: 4 g/dL (ref 3.4–5.0)
ALKALINE PHOSPHATASE: 91 U/L (ref 46–116)
ALT (SGPT): 10 U/L (ref 10–49)
ANION GAP: 8 mmol/L (ref 5–14)
AST (SGOT): 18 U/L (ref <=34)
BILIRUBIN TOTAL: 1.1 mg/dL (ref 0.3–1.2)
BLOOD UREA NITROGEN: 12 mg/dL (ref 9–23)
BUN / CREAT RATIO: 14
CALCIUM: 9.9 mg/dL (ref 8.7–10.4)
CHLORIDE: 101 mmol/L (ref 98–107)
CO2: 24 mmol/L (ref 20.0–31.0)
CREATININE: 0.87 mg/dL
EGFR CKD-EPI (2021) MALE: >90 mL/min/{1.73_m2} (ref >=60)
GLUCOSE RANDOM: 109 mg/dL (ref 70–179)
POTASSIUM: 4.2 mmol/L (ref 3.5–5.1)
PROTEIN TOTAL: 8.3 g/dL — ABNORMAL HIGH (ref 5.7–8.2)
SODIUM: 133 mmol/L — ABNORMAL LOW (ref 135–145)

## 2022-06-26 LAB — CBC W/ AUTO DIFF
BASOPHILS ABSOLUTE COUNT: 0 10*9/L (ref 0.0–0.1)
BASOPHILS RELATIVE PERCENT: 0.6 %
EOSINOPHILS ABSOLUTE COUNT: 0.1 10*9/L (ref 0.0–0.5)
EOSINOPHILS RELATIVE PERCENT: 1 %
HEMATOCRIT: 37.6 % — ABNORMAL LOW (ref 39.0–48.0)
HEMOGLOBIN: 11.9 g/dL — ABNORMAL LOW (ref 12.9–16.5)
LYMPHOCYTES ABSOLUTE COUNT: 2.1 10*9/L (ref 1.1–3.6)
LYMPHOCYTES RELATIVE PERCENT: 27.9 %
MEAN CORPUSCULAR HEMOGLOBIN CONC: 31.7 g/dL — ABNORMAL LOW (ref 32.0–36.0)
MEAN CORPUSCULAR HEMOGLOBIN: 25.4 pg — ABNORMAL LOW (ref 25.9–32.4)
MEAN CORPUSCULAR VOLUME: 80.1 fL (ref 77.6–95.7)
MEAN PLATELET VOLUME: 7.7 fL (ref 6.8–10.7)
MONOCYTES ABSOLUTE COUNT: 1.2 10*9/L — ABNORMAL HIGH (ref 0.3–0.8)
MONOCYTES RELATIVE PERCENT: 16.8 %
NEUTROPHILS ABSOLUTE COUNT: 4 10*9/L (ref 1.8–7.8)
NEUTROPHILS RELATIVE PERCENT: 53.7 %
PLATELET COUNT: 360 10*9/L (ref 150–450)
RED BLOOD CELL COUNT: 4.7 10*12/L (ref 4.26–5.60)
RED CELL DISTRIBUTION WIDTH: 16 % — ABNORMAL HIGH (ref 12.2–15.2)
WBC ADJUSTED: 7.4 10*9/L (ref 3.6–11.2)

## 2022-06-26 LAB — PSA: PROSTATE SPECIFIC ANTIGEN: 1.66 ng/mL (ref 0.00–4.00)

## 2022-06-26 LAB — TESTOSTERONE: TESTOSTERONE TOTAL: 26 ng/dL — ABNORMAL LOW

## 2022-06-26 NOTE — Unmapped (Signed)
[facility] Visit Note    Patient Name: Mark Stephenson  Patient Age: 62 y.o.  Encounter Date: 06/26/2022  Attending Provider:  Laurette Schimke. 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 M with HTN, chronic pancreatitis and high volume mHSPC who will be treated per the ARASENS trial. He is tolerating chemo well with no worsening of numbness in his feet.      Recommendations  1) Prostate cancer - PSA with nice response to treatment     Imaging:  - PSMA-PET scan with widely metastatic disease noted at baseline. Now with repeat PET 01/2022  s/p chemo completion, reporting great response to treatment, however there was not of a distended gallbladder with ?? Obstruction. [place] was negative in findings, LFT's normal, asymptomatic.     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     3) Bone health  - DEXA scan with osteopenia (Frax 2.3%) 06/2021  - continue calcium 800mg  daily + vitamin D 1200 international units daily    4) Metabolic health  - recommended daily exercise and heart-healthy diet for metabolic health while on ADT  - nutrition following    5) Anemia: multifactorial (related to chemotherapy and chronic disease  - Ferritin and plts elevated   - No interventions at this time, will monitor, and should correct off chemo    6) Gallstones: visualized on PET imaging  -- [place] negative in finding  -- Denies abdominal pain, nausea, emesis, etc    RTC in 8 weeks with visit and labs    Reason for Visit  The patient is seen in consultation at the request of Kennieth Francois, FNP for evaluation of prostate cancer.    History of Present Illness:  Hematology/Oncology History Overview Note   03/19/21 - CT A/P with contrast performed to follow up on pancreatic mass, with near resolution of pancreatic uncinate process. Bulky multistation abdominopelvic lymphadenopathy identified (e.g. left para-aortic node measuring 2.1cm and right common iliac node measuring 1.4cm). No concerning osseous lesions identified. Enlarged, heterogeneously enhancing prostate gland.  04/17/21 - PSA 48.83  05/29/21 - retroperitoneal lymph node core needle biopsy, pathology consistent with metastatic prostate adenocarcinoma.  06/27/21 -  PSMA-PET scan with widely metastatic osseous disease as well as avid adenopathy extending from the pelvis to the left supraclavicular region consistent with metastatic prostate cancer. Noting that there is one single enlarged retroperitoneal node with low/minimal tracer avidity measuring up to 4 cm.  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)     06/04/2021 -  Cancer Staged    Staging form: Prostate, AJCC 8th Edition  - Clinical: Stage IVB (cT1c, cN1, cM1a, PSA: 48) - Signed by Davy Pique, MD on 06/04/2021       08/26/2021 - 12/24/2021 Chemotherapy    OP PROSTATE DOCETAXEL/PREDNISONE  Docetaxel 75 mg/m2 evry 21 days     08/27/2021 Endocrine/Hormone Therapy    OP PROSTATE LEUPROLIDE (ELIGARD) 45 MG EVERY 6 MONTHS  Plan Provider: Davy Pique, MD     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 Mild fatigue. No fevers, chills, worsening numbness/tingling, nail/skin changes. No swelling in LE. Denies abdominal pain. No issues with constipation or diarrhea. No issues with urination. Eating well. Wt stable. BP stable. Denies PN.     Allergies:  No Known Allergies    Current Medications:    Current Outpatient Medications:     amlodipine (NORVASC) 10 MG tablet, Take 1 tablet (10 mg total) by mouth daily., Disp: 90 tablet, Rfl: 0    buPROPion (WELLBUTRIN SR) 150 MG 12 hr tablet, Take 1 tablet (150 mg total) by mouth., Disp: , Rfl:     calcium carbonate-vitamin D3 600 mg-20 mcg (800 unit) Tab, Take 1 tablet by mouth two (2) times a day., Disp: 60 tablet, Rfl: 11    darolutamide (NUBEQA) 300 mg tablet, Take 2 tablets (600 mg total) by mouth in the morning and 2 tablets (600 mg total) in the evening. Take with food. Swallow tablets whole., Disp: 120 tablet, Rfl: 11    hydroCHLOROthiazide (HYDRODIURIL) 25 MG tablet, Take 1 tablet (25 mg total) by mouth daily., Disp: 90 tablet, Rfl: 0    pantoprazole (PROTONIX) 40 MG tablet, Take 1 tablet (40 mg total) by mouth., Disp: , Rfl:     pravastatin (PRAVACHOL) 20 MG tablet, Take 1 tablet (20 mg total) by mouth daily., Disp: 90 tablet, Rfl: 0    tamsulosin (FLOMAX) 0.4 mg capsule, , Disp: , Rfl:     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)    Anemia of chronic disease    Gallstones and inflammation of gallbladder without obstruction  Past Surgical History:   Procedure Laterality Date    CHG X-RAY FOR BILE DUCT ENDOSCOPY  12/08/2019    Procedure: ENDOSCOPIC CATHETERIZATION OF THE BILIARY DUCTAL SYSTEM, RADIOLOGICAL SUPERVISION AND INTERPRETATION;  Surgeon: Chriss Driver, MD;  [facility];  Service: Gastroenterology    PR ERCP,SPHINCTEROTOMY N/A 12/08/2019    Procedure: ERCP; W/SPHINCTEROTOMY/PAPILLOTOMY;  Surgeon: Chriss Driver, MD;  Location: [facility];  Service: Gastroenterology    PR UPGI ENDOSCOPY W/US FN BX N/A 12/08/2019    Procedure: UGI W/ TRANSENDOSCOPIC ULTRASOUND GUIDED INTRAMURAL/TRANSMURAL FINE NEEDLE ASPIRATION/BIOPSY(S), ESOPHAGUS;  Surgeon: Chriss Driver, MD;  Location: [facility];  Service: Gastroenterology        Social History     Occupational History    Occupation: disability   Tobacco Use    Smoking status: Every Day    Smokeless tobacco: Never    Tobacco comments:     1 pack per week. reports gradually.    Vaping Use    Vaping status: Never Used   Substance and Sexual Activity    Alcohol use: Yes     Alcohol/week: 21.0 standard drinks of alcohol     Types: 21 Cans of beer per week     Comment: 3x 40-once cans of beer per day    Drug use: No    Sexual activity: Not on file       Family History  Family History   Problem Relation Age of Onset    Cancer Father     Cancer Brother     Anesthesia problems Neg Hx      Prostate Cancer Family History Assessment:  History of cancer in children (yes/no; if yes, what type AND age of diagnosis): no children  Total number of siblings (including deceased): 4 brothers, 2 sisters  History of cancer in siblings (yes/no; if yes, provide relation, type of cancer, AND age of diagnosis): oldest brother had unknown type of cancer  History of cancer in parents (yes/no; if yes, please specify parent, type of cancer, AND age of diagnosis): mother - unknown, father passed from unknown cancer  History of cancer in aunts/uncles/grandparents (yes/no; if yes, provide relation, type of cancer, AND age of diagnosis): none known      Review of Systems:  A comprehensive review of 10 systems was negative except for pertinent positives noted in HPI.    Physical Exam:  VITAL SIGNS:  BP 146/82  - Pulse 79  - Temp 35.6 ??C (96.1 ??F) (Tympanic)  - Resp 16  - Ht 175.3 cm (5' 9)  - Wt 73.1 kg (161 lb 3.2 oz)  - SpO2 100%  - BMI 23.81 kg/m??   ECOG Performance Status: 0  GENERAL: Well-developed, well-nourished patient in no acute distress.  HEAD: Normocephalic and atraumatic.  EYES: Conjunctivae are normal. No scleral icterus.  MOUTH/THROAT: Oropharynx is clear and moist.  No mucosal lesions. Poor dentition, with tooth on the bottom right decaying.  NECK: Supple, no thyromegaly.  CARDIOVASCULAR: Normal rate, regular rhythm and normal heart sounds.  Exam reveals no gallop and no friction rub.  No murmur heard.  PULMONARY/CHEST: Effort normal and breath sounds normal. No respiratory distress.  ABDOMINAL:  Soft. There is no distension. There is no tenderness. There is no rebound and no guarding.  MUSCULOSKELETAL: No clubbing, cyanosis, or lower extremity edema.  PSYCHIATRIC: Alert and oriented.  Normal mood and affect.  NEUROLOGIC: No focal motor deficit. Normal gait.  SKIN: Skin is  warm, dry, and intact.      Results/Orders:    No visits with results within 2 Week(s) from this visit.   Latest known visit with results is:   Lab on 04/25/2022   Component Date Value Ref Range Status    Sodium 04/25/2022 137  135 - 145 mmol/L Final    Potassium 04/25/2022 4.0  3.5 - 5.1 mmol/L Final    Creatinine 04/25/2022 0.81  0.73 - 1.18 mg/dL Final    eGFR CKD-EPI (2021) Male 04/25/2022 >90  >=60 mL/min/1.33m2 Final    eGFR calculated with CKD-EPI 2021 equation in accordance with [employer] and [employer] of Nephrology Task Force recommendations.    Albumin 04/25/2022 3.7  3.4 - 5.0 g/dL Final    Total Protein 04/25/2022 7.8  5.7 - 8.2 g/dL Final    Total Bilirubin 04/25/2022 0.6  0.3 - 1.2 mg/dL Final    Bilirubin, Direct 04/25/2022 0.20  0.00 - 0.30 mg/dL Final    AST 16/02/9603 16  <=34 U/L Final    ALT 04/25/2022 7 (L)  10 - 49 U/L Final    Alkaline Phosphatase 04/25/2022 92  46 - 116 U/L Final    PSA 04/25/2022 5.63 (H)  0.00 - 4.00 ng/mL Final       Lab Results   Component Value Date    PSA 1.66 06/26/2022    PSA 5.63 (H) 04/25/2022    PSA 0.50 01/28/2022    PSA 1.35 12/24/2021    PSA 1.44 12/05/2021    PSA 2.67 11/12/2021    PSA 4.78 (H) 10/22/2021    PSA 21.05 (H) 09/17/2021    PSA 64.37 (H) 08/27/2021    PSA 406.39 (H) 07/30/2021       Orders placed or performed in visit on 06/26/22    PET CT Skull Base to Thigh    Clinic Appointment Request Physician    LAB Appointment Request *Canceled*    Clinic Appointment Request APP *Canceled*    LAB Appointment Request    Clinic Appointment Request APP

## 2022-06-26 NOTE — Unmapped (Signed)
Treatment:  - ADT: ADT started 07/30/21, Eligard 45mg  received 04/25/2022; due again 10/26/2022  - ARSI: started darolutamide per ARASENS trial in mid April 2023  - chemotherapy: docetaxel chemotherapy 75mg /m2 started 08/27/21, and completed 12/24/2021

## 2022-06-28 NOTE — Unmapped (Signed)
[facility] Refill Coordination Note    Specialty Medication(s) to be Shipped:   Hematology/Oncology: Nubeqa    Other medication(s) to be shipped: No additional medications requested for fill at this time     Mark Stephenson, DOB: [birth year]  Phone: [phone] (work)      All above HIPAA information was verified with patient.     Was a Nurse, learning disability used for this call? No    Completed refill call assessment today to schedule patient's medication shipment from the [facility] [phone]).  All relevant notes have been reviewed.     Specialty medication(s) and dose(s) confirmed: Regimen is correct and unchanged.   Changes to medications: [place] reports no changes at this time.  Changes to insurance: No  New side effects reported not previously addressed with a pharmacist or physician: None reported  Questions for the pharmacist: No    Confirmed patient received a Conservation officer, historic buildings and a Surveyor, mining with first shipment. The patient will receive a drug information handout for each medication shipped and additional FDA Medication Guides as required.       DISEASE/MEDICATION-SPECIFIC INFORMATION        N/A    SPECIALTY MEDICATION ADHERENCE     Medication Adherence    Patient reported X missed doses in the last month: 0  Specialty Medication: Nubeqa 300 mg  Patient is on additional specialty medications: No  Informant: patient     Were doses missed due to medication being on hold? No    Nubeqa 300 mg: 14 days of medicine on hand       REFERRAL TO PHARMACIST     Referral to the pharmacist: Not needed      [facility]     Shipping address confirmed in Epic.     Delivery Scheduled: Yes, Expected medication delivery date: 07/09/22.     Medication will be delivered via Next Day Courier to the prescription address in Epic [place].    [name]   [facility] Specialty Technician

## 2022-07-05 ENCOUNTER — Encounter: Payer: Self-pay | Admitting: *Deleted

## 2022-07-07 MED FILL — NUBEQA 300 MG TABLET: ORAL | 30 days supply | Qty: 120 | Fill #9

## 2022-07-16 ENCOUNTER — Other Ambulatory Visit: Payer: Self-pay

## 2022-07-16 DIAGNOSIS — R339 Retention of urine, unspecified: Secondary | ICD-10-CM | POA: Diagnosis not present

## 2022-07-16 DIAGNOSIS — Z8546 Personal history of malignant neoplasm of prostate: Secondary | ICD-10-CM | POA: Diagnosis not present

## 2022-07-16 DIAGNOSIS — I10 Essential (primary) hypertension: Secondary | ICD-10-CM | POA: Diagnosis not present

## 2022-07-16 LAB — URINALYSIS, ROUTINE W REFLEX MICROSCOPIC
Bilirubin Urine: NEGATIVE
Glucose, UA: NEGATIVE mg/dL
Ketones, ur: NEGATIVE mg/dL
Leukocytes,Ua: NEGATIVE
Nitrite: NEGATIVE
Protein, ur: NEGATIVE mg/dL
Specific Gravity, Urine: 1.005 (ref 1.005–1.030)
pH: 5 (ref 5.0–8.0)

## 2022-07-16 NOTE — ED Triage Notes (Addendum)
Pt to ED via POV c/o urinary retention. Pt states he hasn't properly peed in 4 days, states it only trickles. Has happened before in the past. Bladder scanner shows >956ms

## 2022-07-17 ENCOUNTER — Emergency Department
Admission: EM | Admit: 2022-07-17 | Discharge: 2022-07-17 | Disposition: A | Discharge status: Home / Self Care | Payer: Medicaid Other | Attending: Emergency Medicine | Admitting: Emergency Medicine

## 2022-07-17 DIAGNOSIS — R339 Retention of urine, unspecified: Secondary | ICD-10-CM

## 2022-07-17 MED ORDER — CEPHALEXIN 500 MG PO CAPS
500.0000 mg | ORAL_CAPSULE | Freq: Two times a day (BID) | ORAL | 0 refills | Status: AC
Start: 2022-07-17 — End: 2022-07-22

## 2022-07-17 MED ORDER — CEPHALEXIN 500 MG PO CAPS
500.0000 mg | ORAL_CAPSULE | Freq: Once | ORAL | Status: AC
  Administered 2022-07-17: 500 mg via ORAL
  Filled 2022-07-17: qty 1

## 2022-07-17 NOTE — Discharge Instructions (Addendum)
Call Dr. Bernardo Heater of urology for follow-up appointment this week.  Thank you for choosing [place] for your health care today!  Please see your primary doctor this week for a follow up appointment.   Sometimes, in the early stages of certain disease courses it is difficult to detect in the emergency department evaluation -- so, it is important that you continue to monitor your symptoms and call your doctor right away or return to the emergency department if you develop any new or worsening symptoms.  Please go to the following website to schedule new (and existing) patient appointments:   [email or web address]  If you do not have a primary doctor try calling the following clinics to establish care:  If you have insurance:  [facility] [phone] [place] [address]   [facility]  [phone] [place] [address]   If you do not have insurance:  [facility]  [phone] [address] [place] [address]   The following is another list of primary care offices in the area who are accepting new patients at this time.  Please reach out to one of them directly and let them know you would like to schedule an appointment to follow up on an Emergency Department visit, and/or to establish a new primary care provider (PCP).  There are likely other primary care clinics in the are who are accepting new patients, but this is an excellent place to start:  [place] physician: Dr Lavon Paganini [address] [place], [place] [address] [phone]  [facility] Lead Physician: Dr Steele Sizer [address], [place], [place] [address] [phone]  [place] Physician: Dr Park Liter [address] [place], [place] [address] [phone]  [facility] Lead Physician: Dr Dewaine Oats [place], [place], [place] [address] [phone]  [facility] [place] Physician: Dr Halina Maidens [address] [place], [place] [address] [phone]   It was my pleasure to care for you today.   Hoover Brunette Jacelyn Grip, MD

## 2022-07-17 NOTE — ED Notes (Signed)
At request of the patient, Large catheter bag changed to Leg bag.

## 2022-07-17 NOTE — ED Provider Notes (Signed)
[facility] Provider Note    Event Date/Time   First MD Initiated Contact with Patient 07/17/22 0100     (approximate)   History   Urinary Retention   HPI  Louis Gardner is a 62 y.o. male   Past medical history of prostate cancer in remission,prostate ca in remission, htn, who presents with urinary retention.  He has had a weak stream worsening over the last several weeks and then today was unable to make urine.  He has suprapubic fullness sensation and discomfort.  He had no dysuria, fevers, chills.  He has no other acute medical complaints.   Independent Historian contributed to assessment above: His girlfriend who is at bedside  External Medical Documents Reviewed: Surgical oncology note dated 06/26/2018 for office visit documenting his past medical history of prostate cancer and chemotherapy completed in August 2023 currently on hormone suppression medication, also noted and a Foley was placed on 220 and discontinued at the time of note.      Physical Exam   Triage Vital Signs: ED Triage Vitals [07/16/22 2247]  Enc Vitals Group     BP (!) 174/96     Pulse Rate 100     Resp (!) 22     Temp 98.5 F (36.9 C)     Temp Source Oral     SpO2 97 %     Weight 151 lb (68.5 kg)     Height '5\' 9"'$  (1.753 m)     Head Circumference      Peak Flow      Pain Score 10     Pain Loc      Pain Edu?      Excl. in [place]?     Most recent vital signs: Vitals:   07/16/22 2247  BP: (!) 174/96  Pulse: 100  Resp: (!) 22  Temp: 98.5 F (36.9 C)  SpO2: 97%    General: Awake, no distress.  CV:  Good peripheral perfusion.  Resp:  Normal effort.  Abd:  No distention.  Other:  Foley has been placed with good urine output approximately 700 cc clear yellow abdomen soft nontender   ED Results / Procedures / Treatments   Labs (all labs ordered are listed, but only abnormal results are displayed) Labs Reviewed  URINALYSIS, ROUTINE W REFLEX MICROSCOPIC -  Abnormal; Notable for the following components:      Result Value   Color, Urine STRAW (*)    APPearance CLEAR (*)    Hgb urine dipstick MODERATE (*)    Bacteria, UA RARE (*)    All other components within normal limits     I ordered and reviewed the above labs they are notable for rare bacteria in the urine   PROCEDURES:  Critical Care performed: No  Procedures   MEDICATIONS ORDERED IN ED: Medications  cephALEXin (KEFLEX) capsule 500 mg (has no administration in time range)     IMPRESSION / MDM / ASSESSMENT AND PLAN / ED COURSE  I reviewed the triage vital signs and the nursing notes.                                Patient's presentation is most consistent with acute presentation with potential threat to life or bodily function.  Differential diagnosis includes, but is not limited to, acute urinary retention due to BPH, prostate cancer, infection, obstructive uropathy   The patient is on the  cardiac monitor to evaluate for evidence of arrhythmia and/or significant heart rate changes.  MDM: This is a patient with acute urinary retention history of prostate cancer and recent Foley catheterization in February who presents with acute urinary retention.  Symptoms relieved patient comfortable after Foley placement.  Some bacteria in the urine will cover for UTI.  I advised labs to check for kidney function, check electrolytes but patient says that he feels relief and wants to defer further workup, stating that he wants to go home now.  I asked him to follow-up with his surgical oncologist/urologist establish care at [facility] for his prior prostate cancer.  I also gave him follow-up with our urologist if he is unable to get in touch with his.         FINAL CLINICAL IMPRESSION(S) / ED DIAGNOSES   Final diagnoses:  Urinary retention     Rx / DC Orders   ED Discharge Orders          Ordered    cephALEXin (KEFLEX) 500 MG capsule  2 times daily        07/17/22 0122              Note:  This document was prepared using Dragon voice recognition software and may include unintentional dictation errors.    Lucillie Garfinkel, MD 07/17/22 [phone]

## 2022-07-22 ENCOUNTER — Ambulatory Visit: Admit: 2022-07-22 | Payer: PRIVATE HEALTH INSURANCE

## 2022-07-24 NOTE — Unmapped (Addendum)
Patient agreed to have refill delivered to arrive on 4/2. No questions, concerns, or changes at this time. Next doctor appt scheduled for 4/17.      [facility] Clinical Assessment & Refill Coordination Note    Mark Stephenson, DOB: 06-24-1960  Phone: [phone] (work)    All above HIPAA information was verified with patient.     Was a Nurse, learning disability used for this call? No    Specialty Medication(s):   Hematology/Oncology: [name]     Current Outpatient Medications   Medication Sig Dispense Refill    amlodipine (NORVASC) 10 MG tablet Take 1 tablet (10 mg total) by mouth daily. 90 tablet 0    buPROPion (WELLBUTRIN SR) 150 MG 12 hr tablet Take 1 tablet (150 mg total) by mouth.      calcium carbonate-vitamin D3 600 mg-20 mcg (800 unit) Tab Take 1 tablet by mouth two (2) times a day. 60 tablet 11    darolutamide (NUBEQA) 300 mg tablet Take 2 tablets (600 mg total) by mouth in the morning and 2 tablets (600 mg total) in the evening. Take with food. Swallow tablets whole. 120 tablet 11    hydroCHLOROthiazide (HYDRODIURIL) 25 MG tablet Take 1 tablet (25 mg total) by mouth daily. 90 tablet 0    pantoprazole (PROTONIX) 40 MG tablet Take 1 tablet (40 mg total) by mouth.      pravastatin (PRAVACHOL) 20 MG tablet Take 1 tablet (20 mg total) by mouth daily. 90 tablet 0    tamsulosin (FLOMAX) 0.4 mg capsule        No current facility-administered medications for this visit.        Changes to medications: [name] reports no changes at this time.    No Known Allergies    Changes to allergies: No    SPECIALTY MEDICATION ADHERENCE     Nubeqa 300 mg (take 2 tabs BID) : 14 days of medicine on hand     Specialty medication(s) dose(s) confirmed: Regimen is correct and unchanged.     Are there any concerns with adherence? No    Adherence counseling provided? Not needed    CLINICAL MANAGEMENT AND INTERVENTION      Clinical Benefit Assessment:    Do you feel the medicine is effective or helping your condition? Yes    Clinical Benefit counseling provided? Not needed    Adverse Effects Assessment:    Are you experiencing any side effects? Yes, patient reports experiencing hot flashes, has been eating ice cream to manage. Side effect counseling provided: None    Are you experiencing difficulty administering your medicine? No    Quality of Life Assessment:    Quality of Life    Rheumatology  Oncology  Dermatology  Cystic Fibrosis          How many days over the past month did your prostate cancer  keep you from your normal activities? For example, brushing your teeth or getting up in the morning. 0    Have you discussed this with your provider? Not needed    Acute Infection Status:    Acute infections noted within Epic:  No active infections  Patient reported infection: None    Therapy Appropriateness:    Is therapy appropriate and patient progressing towards therapeutic goals? Yes, therapy is appropriate and should be continued    DISEASE/MEDICATION-SPECIFIC INFORMATION      N/A    Oncology: Is the patient receiving adequate infection prevention treatment? Not applicable  PATIENT SPECIFIC NEEDS     Does the patient have any physical, cognitive, or cultural barriers? No    Is the patient high risk? No    Did the patient require a clinical intervention? No    Does the patient require physician intervention or other additional services (i.e., nutrition, smoking cessation, social work)? No    SOCIAL DETERMINANTS OF HEALTH     At the [facility], we have learned that life circumstances - like trouble affording food, housing, utilities, or transportation can affect the health of many of our patients.   That is why we wanted to ask: are you currently experiencing any life circumstances that are negatively impacting your health and/or quality of life? Patient declined to answer    Social Determinants of Health     Financial Resource Strain: Not on file   Internet Connectivity: Not on file   Food Insecurity: Not on file Tobacco Use: High Risk (06/26/2022)    Patient History     Smoking Tobacco Use: Every Day     Smokeless Tobacco Use: Never     Passive Exposure: Not on file   Housing/Utilities: Not on file   Alcohol Use: Not on file   Transportation Needs: Not on file   Substance Use: Not on file   Health Literacy: Not on file   Physical Activity: Not on file   Interpersonal Safety: Not on file   Stress: Not on file   Intimate Partner Violence: Not on file   Depression: Not at risk (06/26/2022)    PHQ-2     PHQ-2 Score: 0   Social Connections: Not on file       Would you be willing to receive help with any of the needs that you have identified today? Not applicable       SHIPPING     Specialty Medication(s) to be Shipped:   Hematology/Oncology: Nubeqa    Other medication(s) to be shipped: No additional medications requested for fill at this time     Changes to insurance: No    Patient was informed of new phone menu:  no change to oncology menu    Delivery Scheduled: Yes, Expected medication delivery date: Tuesday, 4/2.     Medication will be delivered via Next Day Courier to the confirmed prescription address in [facility].    The patient will receive a drug information handout for each medication shipped and additional FDA Medication Guides as required.  Verified that patient has previously received a Conservation officer, historic buildings and a Surveyor, mining.    The patient or caregiver noted above participated in the development of this care plan and knows that they can request review of or adjustments to the care plan at any time.      All of the patient's questions and concerns have been addressed.    Mark Stephenson   [facility] Specialty Pharmacist    I reviewed this patient case and all documentation provided by the learner and was readily available for consultation during their interaction with the patient.  I agree with the assessment and plan listed below.    Mark Stephenson, [facility]   [facility] Specialty Pharmacist

## 2022-07-24 NOTE — Unmapped (Signed)
Hi ,    Patient Mark Stephenson contacted the Communication Center to cancel their appointment for today.  The appointment has been cancelled.    Cancellation Reason: Transportation   -Rescheduled patient to 417/24 at 10am      Thank you,  Warnell Bureau  [facility]   [phone]

## 2022-07-29 ENCOUNTER — Ambulatory Visit: Payer: Medicaid Other | Admitting: Physician Assistant

## 2022-07-29 ENCOUNTER — Ambulatory Visit (INDEPENDENT_AMBULATORY_CARE_PROVIDER_SITE_OTHER): Payer: Medicaid Other | Admitting: Physician Assistant

## 2022-07-29 DIAGNOSIS — R339 Retention of urine, unspecified: Secondary | ICD-10-CM

## 2022-07-29 LAB — BLADDER SCAN AMB NON-IMAGING: Scan Result: 202

## 2022-07-29 NOTE — Progress Notes (Signed)
Catheter Removal  Patient is present today for a catheter removal.  73ml of water was drained from the balloon. A 14FR foley cath was removed from the bladder, no complications were noted. Patient tolerated well.  Performed by: Gaspar Cola CMA  Follow up/ Additional notes: This afternoon

## 2022-07-29 NOTE — Progress Notes (Signed)
07/29/2022 3:31 PM   Louis Gardner 24-Aug-1960 BP:[id number]  CC: Chief Complaint  Patient presents with   Urinary Retention    HPI: Louis Gardner is a 62 y.o. male with PMH metastatic prostate cancer on ADT, darolutamide, docetaxel managed by medical oncology at [facility] with a history of recurrent urinary retention who passed a voiding trial with me in July 2023 in the setting of ADT who presents today for voiding trial.   He was seen in the emergency department 13 days ago with reports of 4 days of trickling urinary stream.  Bladder scan on arrival with >940mL of urine.  Foley catheter was placed.  UA with 6-10 RBC/hpf, no pyuria, and rare bacteria.  He was treated with Keflex 500 mg twice daily x 5 days.  No urine culture was sent.  Today he reports a similar episode of urinary retention last month.  Foley catheter removed in the morning, see separate procedure note for details.  He returned to clinic in the afternoon for PVR.  He reports drinking approximately 36 ounces of fluid and has been able to void multiple times without difficulty.  He denies pain.  He is up-to-date with [facility] follow-up.  PVR 202 mL.  PMH: Past Medical History:  Diagnosis Date   Arthritis    Depression    GI bleeding    Hypertension    Pancreatic cancer ([place])    Sleep apnea     Surgical History: Past Surgical History:  Procedure Laterality Date   COLONOSCOPY     ESOPHAGOGASTRODUODENOSCOPY (EGD) WITH PROPOFOL N/A 03/19/2016   Procedure: ESOPHAGOGASTRODUODENOSCOPY (EGD) WITH PROPOFOL;  Surgeon: Lollie Sails, MD;  Location[facility] ENDOSCOPY;  Service: Endoscopy;  Laterality: N/A;    Home Medications:  Allergies as of 07/29/2022   No Known Allergies      Medication List        Accurate as of July 29, 2022  3:31 PM. If you have any questions, ask your nurse or doctor.          amLODipine 10 MG tablet Commonly known as: NORVASC Take 10 mg by mouth daily.   buPROPion 150 MG 12 hr  tablet Commonly known as: WELLBUTRIN SR Take 150 mg by mouth 2 (two) times daily.   Calcium Carb-Cholecalciferol 600-20 MG-MCG Tabs Take by mouth.   hydrochlorothiazide 12.5 MG tablet Commonly known as: HYDRODIURIL Take 12.5 mg by mouth daily.   pantoprazole 40 MG tablet Commonly known as: PROTONIX Take 40 mg by mouth daily.   pravastatin 20 MG tablet Commonly known as: PRAVACHOL        Allergies:  No Known Allergies  Family History: No family history on file.  Social History:   reports that he has been smoking cigarettes. He has been exposed to tobacco smoke. He has never used smokeless tobacco. He reports current alcohol use of about 6.0 standard drinks of alcohol per week. He reports that he does not use drugs.  Physical Exam: There were no vitals taken for this visit.  Constitutional:  Alert and oriented, no acute distress, nontoxic appearing HEENT: [place], AT Cardiovascular: No clubbing, cyanosis, or edema Respiratory: Normal respiratory effort, no increased work of breathing Skin: No rashes, bruises or suspicious lesions Neurologic: Grossly intact, no focal deficits, moving all 4 extremities Psychiatric: Normal mood and affect  Laboratory Data: Results for orders placed or performed in visit on 07/29/22  Bladder Scan (Post Void Residual) in office  Result Value Ref Range   Scan Result  202    Assessment & Plan:   1. Urinary retention Voiding trial passed today, residual slightly elevated but he reports significant improvement in voiding symptoms over prior.  I offered him CIC teaching today, but he declined this.  We discussed that he can follow-up with me as desired if he changes mind about this has a backup plan.  I also advised him to make [facility] aware of his recurrent retention.  He expressed understanding. - Bladder Scan (Post Void Residual) in office  Return if symptoms worsen or fail to improve.  Debroah Loop, PA-C  [facility] [address], [place], [place] [address] [phone]

## 2022-08-02 DIAGNOSIS — C61 Malignant neoplasm of prostate: Principal | ICD-10-CM

## 2022-08-02 DIAGNOSIS — C772 Secondary and unspecified malignant neoplasm of intra-abdominal lymph nodes: Principal | ICD-10-CM

## 2022-08-02 MED ORDER — NUBEQA 300 MG TABLET
ORAL_TABLET | Freq: Two times a day (BID) | ORAL | 11 refills | 30 days
Start: 2022-08-02 — End: ?

## 2022-08-04 ENCOUNTER — Emergency Department
Admission: EM | Admit: 2022-08-04 | Discharge: 2022-08-04 | Disposition: A | Discharge status: Home / Self Care | Payer: Medicaid Other | Attending: Emergency Medicine | Admitting: Emergency Medicine

## 2022-08-04 ENCOUNTER — Other Ambulatory Visit: Payer: Self-pay

## 2022-08-04 DIAGNOSIS — R339 Retention of urine, unspecified: Secondary | ICD-10-CM | POA: Insufficient documentation

## 2022-08-04 LAB — URINALYSIS, ROUTINE W REFLEX MICROSCOPIC
Bacteria, UA: NONE SEEN
Bilirubin Urine: NEGATIVE
Glucose, UA: NEGATIVE mg/dL
Ketones, ur: NEGATIVE mg/dL
Leukocytes,Ua: NEGATIVE
Nitrite: NEGATIVE
Protein, ur: NEGATIVE mg/dL
Specific Gravity, Urine: 1.004 — ABNORMAL LOW (ref 1.005–1.030)
pH: 5 (ref 5.0–8.0)

## 2022-08-04 NOTE — ED Notes (Signed)
Post Void bladder scan : 530 ML

## 2022-08-04 NOTE — ED Provider Notes (Signed)
[facility] Provider Note  Patient Contact: 8:30 PM (approximate)   History   Urinary Retention   HPI  Louis Gardner is a 62 y.o. male with a past medical history of metastatic prostate cancer currently under the care of [facility] with a history of recurrent urinary retention, presents to the emergency department with difficulty urinating patient was seen for similar symptoms on 07/17/2022 and had a Foley catheter placed which was removed by urology on 07/29/2022.  Patient denies fever, low back pain or vomiting.     Physical Exam   Triage Vital Signs: ED Triage Vitals [08/04/22 1931]  Enc Vitals Group     BP (!) 155/93     Pulse Rate 84     Resp 16     Temp 98.7 F (37.1 C)     Temp Source Oral     SpO2 96 %     Weight 149 lb 14.6 oz (68 kg)     Height 5\' 9"  (1.753 m)     Head Circumference      Peak Flow      Pain Score 7     Pain Loc      Pain Edu?      Excl. in GC?     Most recent vital signs: Vitals:   08/04/22 1931 08/04/22 2158  BP: (!) 155/93 (!) 145/88  Pulse: 84 86  Resp: 16 20  Temp: 98.7 F (37.1 C)   SpO2: 96% 97%     General: Alert and in no acute distress. Eyes:  PERRL. EOMI. Head: No acute traumatic findings ENT:      Nose: No congestion/rhinnorhea.      Mouth/Throat: Mucous membranes are moist.  Neck: No stridor. No cervical spine tenderness to palpation. Cardiovascular:  Good peripheral perfusion Respiratory: Normal respiratory effort without tachypnea or retractions. Lungs CTAB. Good air entry to the bases with no decreased or absent breath sounds. Gastrointestinal: Bowel sounds 4 quadrants. Soft and nontender to palpation. No guarding or rigidity. No palpable masses. No distention. No CVA tenderness. Musculoskeletal: Full range of motion to all extremities.  Neurologic:  No gross focal neurologic deficits are appreciated.  Skin:   No rash noted    ED Results / Procedures / Treatments   Labs (all labs  ordered are listed, but only abnormal results are displayed) Labs Reviewed  URINALYSIS, ROUTINE W REFLEX MICROSCOPIC - Abnormal; Notable for the following components:      Result Value   Color, Urine STRAW (*)    APPearance CLEAR (*)    Specific Gravity, Urine 1.004 (*)    Hgb urine dipstick LARGE (*)    All other components within normal limits        PROCEDURES:  Critical Care performed: No  Procedures   MEDICATIONS ORDERED IN ED: Medications - No data to display   IMPRESSION / MDM / ASSESSMENT AND PLAN / ED COURSE  I reviewed the triage vital signs and the nursing notes.                              Assessment and plan: Urinary retention:  62 year old male presents to the emergency department with urinary retention type symptoms that started today.  Patient mildly hypertensive at triage but vital signs otherwise reassuring.  Urinalysis shows no signs of UTI.  Foley catheter was placed with leg bag and patient was advised to follow-up with urology.  Return  precautions were given to return with new or worsening symptoms.     FINAL CLINICAL IMPRESSION(S) / ED DIAGNOSES   Final diagnoses:  Urinary retention     Rx / DC Orders   ED Discharge Orders     None        Note:  This document was prepared using Dragon voice recognition software and may include unintentional dictation errors.   Pia Mau [place], PA-C 08/04/22 2333    Chesley Noon, MD 08/06/22 0730

## 2022-08-04 NOTE — ED Triage Notes (Signed)
Pt to ED from home for unable to void. Pt was able to void and provide a specimen. Pt states "they just took a bag off". Foley was just removed last week.  Difficulty urinating the last few days. Pt was advised to urology. They advised to come back to the ER if he was having any more difficulty urinating.

## 2022-08-05 MED ORDER — NUBEQA 300 MG TABLET
ORAL_TABLET | Freq: Two times a day (BID) | ORAL | 11 refills | 30 days | Status: CP
Start: 2022-08-05 — End: ?
  Filled 2022-08-05: qty 120, 30d supply, fill #0

## 2022-08-08 ENCOUNTER — Telehealth: Payer: Self-pay | Admitting: *Deleted

## 2022-08-08 NOTE — Telephone Encounter (Signed)
Vaillancourt, [place]ona [place], PA-C  Seham [place], [place], CMA Can you please schedule him for a voiding trial with me next week? Can you please also confirm whether or not he's still on Flomax? If not, I'd recommend that we resume 0.4 mg daily prior to his voiding trial.

## 2022-08-13 NOTE — Telephone Encounter (Signed)
Appointments scheduled by crystal miller

## 2022-08-21 ENCOUNTER — Other Ambulatory Visit: Admit: 2022-08-21 | Discharge: 2022-08-21 | Payer: PRIVATE HEALTH INSURANCE

## 2022-08-21 ENCOUNTER — Ambulatory Visit: Admit: 2022-08-21 | Discharge: 2022-08-21 | Payer: PRIVATE HEALTH INSURANCE | Attending: Family | Primary: Family

## 2022-08-21 DIAGNOSIS — C775 Secondary and unspecified malignant neoplasm of intrapelvic lymph nodes: Principal | ICD-10-CM

## 2022-08-21 DIAGNOSIS — I10 Essential (primary) hypertension: Principal | ICD-10-CM

## 2022-08-21 DIAGNOSIS — N401 Enlarged prostate with lower urinary tract symptoms: Principal | ICD-10-CM

## 2022-08-21 DIAGNOSIS — C61 Malignant neoplasm of prostate: Principal | ICD-10-CM

## 2022-08-21 DIAGNOSIS — R338 Other retention of urine: Principal | ICD-10-CM

## 2022-08-21 HISTORY — DX: Benign prostatic hyperplasia with lower urinary tract symptoms: N40.1

## 2022-08-21 LAB — COMPREHENSIVE METABOLIC PANEL
ALBUMIN: 4 g/dL (ref 3.4–5.0)
ALKALINE PHOSPHATASE: 100 U/L (ref 46–116)
ALT (SGPT): 12 U/L (ref 10–49)
ANION GAP: 8 mmol/L (ref 5–14)
AST (SGOT): 24 U/L (ref <=34)
BILIRUBIN TOTAL: 1.1 mg/dL (ref 0.3–1.2)
BLOOD UREA NITROGEN: 7 mg/dL — ABNORMAL LOW (ref 9–23)
BUN / CREAT RATIO: 8
CALCIUM: 10.3 mg/dL (ref 8.7–10.4)
CHLORIDE: 104 mmol/L (ref 98–107)
CO2: 25 mmol/L (ref 20.0–31.0)
CREATININE: 0.91 mg/dL
EGFR CKD-EPI (2021) MALE: >90 mL/min/{1.73_m2} (ref >=60)
GLUCOSE RANDOM: 130 mg/dL (ref 70–179)
POTASSIUM: 4.4 mmol/L (ref 3.5–5.1)
PROTEIN TOTAL: 8.2 g/dL (ref 5.7–8.2)
SODIUM: 137 mmol/L (ref 135–145)

## 2022-08-21 LAB — CBC W/ AUTO DIFF
BASOPHILS ABSOLUTE COUNT: 0 10*9/L (ref 0.0–0.1)
BASOPHILS RELATIVE PERCENT: 0.6 %
EOSINOPHILS ABSOLUTE COUNT: 0.1 10*9/L (ref 0.0–0.5)
EOSINOPHILS RELATIVE PERCENT: 1.7 %
HEMATOCRIT: 38 % — ABNORMAL LOW (ref 39.0–48.0)
HEMOGLOBIN: 12.3 g/dL — ABNORMAL LOW (ref 12.9–16.5)
LYMPHOCYTES ABSOLUTE COUNT: 1.8 10*9/L (ref 1.1–3.6)
LYMPHOCYTES RELATIVE PERCENT: 26.5 %
MEAN CORPUSCULAR HEMOGLOBIN CONC: 32.4 g/dL (ref 32.0–36.0)
MEAN CORPUSCULAR HEMOGLOBIN: 25.9 pg (ref 25.9–32.4)
MEAN CORPUSCULAR VOLUME: 79.7 fL (ref 77.6–95.7)
MEAN PLATELET VOLUME: 7.4 fL (ref 6.8–10.7)
MONOCYTES ABSOLUTE COUNT: 0.9 10*9/L — ABNORMAL HIGH (ref 0.3–0.8)
MONOCYTES RELATIVE PERCENT: 13.1 %
NEUTROPHILS ABSOLUTE COUNT: 4 10*9/L (ref 1.8–7.8)
NEUTROPHILS RELATIVE PERCENT: 58.1 %
PLATELET COUNT: 383 10*9/L (ref 150–450)
RED BLOOD CELL COUNT: 4.76 10*12/L (ref 4.26–5.60)
RED CELL DISTRIBUTION WIDTH: 15.5 % — ABNORMAL HIGH (ref 12.2–15.2)
WBC ADJUSTED: 6.8 10*9/L (ref 3.6–11.2)

## 2022-08-21 LAB — TESTOSTERONE: TESTOSTERONE TOTAL: 31 ng/dL — ABNORMAL LOW

## 2022-08-21 LAB — PSA: PROSTATE SPECIFIC ANTIGEN: 8.32 ng/mL — ABNORMAL HIGH (ref 0.00–4.00)

## 2022-08-21 MED ORDER — TAMSULOSIN 0.4 MG CAPSULE
ORAL_CAPSULE | Freq: Every day | ORAL | 0 refills | 90 days | Status: CP
Start: 2022-08-21 — End: ?

## 2022-08-21 MED ORDER — AMLODIPINE 10 MG TABLET
ORAL_TABLET | Freq: Every day | ORAL | 0 refills | 90 days | Status: CP
Start: 2022-08-21 — End: ?

## 2022-08-21 MED ORDER — PRAVASTATIN 20 MG TABLET
ORAL_TABLET | Freq: Every day | ORAL | 0 refills | 90 days | Status: CP
Start: 2022-08-21 — End: ?

## 2022-08-21 MED ORDER — HYDROCHLOROTHIAZIDE 25 MG TABLET
ORAL_TABLET | Freq: Every day | ORAL | 0 refills | 90 days | Status: CP
Start: 2022-08-21 — End: ?

## 2022-08-21 NOTE — Unmapped (Signed)
Return in 3-4 weeks with labs, PSMA PET, visit to review    Follow up urology as schedule

## 2022-08-21 NOTE — Unmapped (Signed)
[facility] Visit Note    Patient Name: Mark Stephenson  Patient Age: 62 y.o.  Encounter Date: 08/21/2022  Attending Provider:  Laurette Schimke. 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 M with HTN, chronic pancreatitis and high volume mHSPC who will be treated per the ARASENS trial. He is tolerating chemo well with no worsening of numbness in his feet.      Recommendations  1) Prostate cancer - PSA with nice response to treatment     Imaging:  - PSMA-PET scan with widely metastatic disease noted at baseline. Now with repeat PET 01/2022  s/p chemo completion, reporting great response to treatment, however there was not of a distended gallbladder with ?? Obstruction. [place] was negative in findings, LFT's normal, asymptomatic.     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 I worry that he has had progression at this time, which would necessitate changing therapy, likely to pluvicto. To note, we ordered a PSMA PET several weeks go but insurance denied payment so the patient was unable to obtain.     2) Bladder outlet obstruction - foley placed on 2/20 and removed, but again now has a foley catheter that was placed last week (08/14/2022)    3) Bone health  - DEXA scan with osteopenia (Frax 2.3%) 06/2021  - continue calcium 800mg  daily + vitamin D 1200 international units daily    4) Metabolic health  - recommended daily exercise and heart-healthy diet for metabolic health while on ADT  - nutrition following    5) Anemia: multifactorial (related to chemotherapy and chronic disease  - Ferritin and plts elevated   - No interventions at this time, will monitor, and should correct off chemo    6) Gallstones: visualized on PET imaging  -- [place] negative in finding  -- Denies abdominal pain, nausea, emesis, etc    7) Urinary retention:   - Foley cath placed in ED last week (4/10), seeing urology tomorrow. I suspect retention is due to POD. Will plan to repeat PET at this time.     RTC in 3-4 weeks with visit, scan, and labs    Reason for Visit  The patient is seen in consultation at the request of Aycock, Ngwe Achirimofo* for evaluation of prostate cancer.    History of Present  Illness:  Hematology/Oncology History Overview Note   03/19/21 - CT A/P with contrast performed to follow up on pancreatic mass, with near resolution of pancreatic uncinate process. Bulky multistation abdominopelvic lymphadenopathy identified (e.g. left para-aortic node measuring 2.1cm and right common iliac node measuring 1.4cm). No concerning osseous lesions identified. Enlarged, heterogeneously enhancing prostate gland.  04/17/21 - PSA 48.83  05/29/21 - retroperitoneal lymph node core needle biopsy, pathology consistent with metastatic prostate adenocarcinoma.  06/27/21 - PSMA-PET scan with widely metastatic osseous disease as well as avid adenopathy extending from the pelvis to the left supraclavicular region consistent with metastatic prostate cancer. Noting that there is one single enlarged retroperitoneal node with low/minimal tracer avidity measuring up to 4 cm.  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)     06/04/2021 -  Cancer Staged    Staging form: Prostate, AJCC 8th Edition  - Clinical: Stage IVB (cT1c, cN1, cM1a, PSA: 30) - Signed by Davy Pique, MD on 06/04/2021       08/26/2021 - 12/24/2021 Chemotherapy    OP PROSTATE DOCETAXEL/PREDNISONE  Docetaxel 75 mg/m2 evry 21 days     08/27/2021 Endocrine/Hormone Therapy    OP PROSTATE LEUPROLIDE (ELIGARD) 45 MG EVERY 6 MONTHS  Plan Provider: Davy Pique, MD     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 Mild fatigue. No fevers, chills, worsening numbness/tingling, nail/skin changes. No swelling in LE. Denies abdominal pain. No issues with constipation or diarrhea. Patient did have to have foley cath replaced last week for urinary retention. Eating well. Wt stable. BP stable. Denies PN.     Allergies:  No Known Allergies    Current Medications:    Current Outpatient Medications:     amlodipine (NORVASC) 10 MG tablet, Take 1 tablet (10 mg total) by mouth daily., Disp: 90 tablet, Rfl: 0    buPROPion (WELLBUTRIN SR) 150 MG 12 hr tablet, Take 1 tablet (150 mg total) by mouth., Disp: , Rfl:     calcium carbonate-vitamin D3 600 mg-20 mcg (800 unit) Tab, Take 1 tablet by mouth two (2) times a day., Disp: 60 tablet, Rfl: 11    darolutamide (NUBEQA) 300 mg tablet, Take 2 tablets (600 mg total) by mouth in the morning and 2 tablets (600 mg total) in the evening. Take with food. Swallow tablets whole., Disp: 120 tablet, Rfl: 11    hydroCHLOROthiazide (HYDRODIURIL) 25 MG tablet, Take 1 tablet (25 mg total) by mouth daily., Disp: 90 tablet, Rfl: 0    pantoprazole (PROTONIX) 40 MG tablet, Take 1 tablet (40 mg total) by mouth., Disp: , Rfl:     pravastatin (PRAVACHOL) 20 MG tablet, Take 1 tablet (20 mg total) by mouth daily., Disp: 90 tablet, Rfl: 0    tamsulosin (FLOMAX) 0.4 mg capsule, , Disp: , Rfl:     Past Medical History and Social History  Patient Active Problem List  Diagnosis    Essential hypertension    Non-cardiac chest pain    Chronic bilateral low back pain without sciatica    History of seizures    Leg weakness, bilateral    Low back pain with right-sided sciatica    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)    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)    Anemia of chronic disease    Gallstones and inflammation of gallbladder without obstruction        Past Surgical History:   Procedure Laterality Date    CHG X-RAY FOR BILE DUCT ENDOSCOPY  12/08/2019    Procedure: ENDOSCOPIC CATHETERIZATION OF THE BILIARY DUCTAL SYSTEM, RADIOLOGICAL SUPERVISION AND INTERPRETATION;  Surgeon: Chriss Driver, MD;  [facility];  Service: Gastroenterology    PR ERCP,SPHINCTEROTOMY N/A 12/08/2019    Procedure: ERCP; W/SPHINCTEROTOMY/PAPILLOTOMY;  Surgeon: Chriss Driver, MD;  [facility];  Service: Gastroenterology    PR UPGI ENDOSCOPY W/US FN BX N/A 12/08/2019    Procedure: UGI W/ TRANSENDOSCOPIC ULTRASOUND GUIDED INTRAMURAL/TRANSMURAL FINE NEEDLE ASPIRATION/BIOPSY(S), ESOPHAGUS;  Surgeon: Chriss Driver, MD;  [facility];  Service: Gastroenterology        Social History     Occupational History    Occupation: disability   Tobacco Use    Smoking status: Every Day    Smokeless tobacco: Never    Tobacco comments:     1 pack per week. reports gradually.    Vaping Use    Vaping status: Never Used   Substance and Sexual Activity    Alcohol use: Yes     Alcohol/week: 21.0 standard drinks of alcohol     Types: 21 Cans of beer per week     Comment: 3x 40-once cans of beer per day    Drug use: No    Sexual activity: Not on file       Family History  Family History   Problem Relation Age of Onset    Cancer Father     Cancer Brother     Anesthesia problems Neg Hx      Prostate Cancer Family History Assessment:  History of cancer in children (yes/no; if yes, what type AND age of diagnosis): no children  Total number of siblings (including deceased): 4 brothers, 2 sisters  History of cancer in siblings (yes/no; if yes, provide relation, type of cancer, AND age of diagnosis): oldest brother had unknown type of cancer  History of cancer in parents (yes/no; if yes, please specify parent, type of cancer, AND age of diagnosis): mother - unknown, father passed from unknown cancer  History of cancer in aunts/uncles/grandparents (yes/no; if yes, provide relation, type of cancer, AND age of diagnosis): none known      Review of Systems:  A comprehensive review of 10 systems was negative except for pertinent positives noted in HPI.    Physical Exam:  VITAL SIGNS:  BP 135/85  - Pulse 63  - Temp 35.6 ??C (96 ??F) (Temporal)  - Resp 16  - Ht 175.3 cm (5' 9)  - Wt 72.7 kg (160 lb 4.8 oz)  - SpO2 100%  - BMI 23.67 kg/m??   ECOG Performance Status: 0  GENERAL: Well-developed, well-nourished patient in no acute distress.  HEAD: Normocephalic and atraumatic.  EYES: Conjunctivae are normal. No scleral icterus.  MOUTH/THROAT: Oropharynx is clear and moist.  No mucosal lesions. Poor dentition, with tooth on the bottom right decaying.  NECK: Supple, no thyromegaly.  CARDIOVASCULAR: Normal rate, regular rhythm and normal heart sounds.  Exam reveals no gallop and no friction rub.  No murmur heard.  PULMONARY/CHEST: Effort normal and breath sounds normal. No respiratory distress.  ABDOMINAL:  Soft. There is no distension. There is no tenderness. There is no rebound and no guarding.  MUSCULOSKELETAL: No clubbing, cyanosis, or lower extremity edema.  PSYCHIATRIC: Alert and oriented.  Normal mood and affect.  NEUROLOGIC: No focal motor deficit. Normal gait.  SKIN: Skin is warm, dry, and intact.      Results/Orders:    Appointment on 08/21/2022   Component Date Value Ref Range Status    Sodium 08/21/2022 137  135 - 145 mmol/L Final    Potassium 08/21/2022 4.4  3.5 - 5.1 mmol/L Final    Chloride 08/21/2022 104  98 - 107 mmol/L Final    CO2 08/21/2022 25.0  20.0 - 31.0 mmol/L Final    Anion Gap 08/21/2022 8  5 - 14 mmol/L Final    BUN 08/21/2022 7 (L)  9 - 23 mg/dL Final    Creatinine 16/02/9603 0.91  0.73 - 1.18 mg/dL Final    BUN/Creatinine Ratio 08/21/2022 8   Final    eGFR CKD-EPI (2021) Male 08/21/2022 >90  >=60 mL/min/1.20m2 Final    eGFR calculated with CKD-EPI 2021 equation in accordance with [employer] and [employer] of Nephrology Task Force recommendations.    Glucose 08/21/2022 130  70 - 179 mg/dL Final    Calcium 54/01/8118 10.3  8.7 - 10.4 mg/dL Final    Albumin 14/78/2956 4.0  3.4 - 5.0 g/dL Final    Total Protein 08/21/2022 8.2  5.7 - 8.2 g/dL Final    Total Bilirubin 08/21/2022 1.1  0.3 - 1.2 mg/dL Final    AST 21/30/8657 24  <=34 U/L Final    ALT 08/21/2022 12  10 - 49 U/L Final    Alkaline Phosphatase 08/21/2022 100  46 - 116 U/L Final    PSA 08/21/2022 8.32 (H)  0.00 - 4.00 ng/mL Final    WBC 08/21/2022 6.8  3.6 - 11.2 10*9/L Final    RBC 08/21/2022 4.76  4.26 - 5.60 10*12/L Final    HGB 08/21/2022 12.3 (L)  12.9 - 16.5 g/dL Final    HCT 84/69/6295 38.0 (L)  39.0 - 48.0 % Final    MCV 08/21/2022 79.7  77.6 - 95.7 fL Final    MCH 08/21/2022 25.9  25.9 - 32.4 pg Final    MCHC 08/21/2022 32.4 32.0 - 36.0 g/dL Final    RDW 28/41/3244 15.5 (H)  12.2 - 15.2 % Final    MPV 08/21/2022 7.4  6.8 - 10.7 fL Final    Platelet 08/21/2022 383  150 - 450 10*9/L Final    Neutrophils % 08/21/2022 58.1  % Final    Lymphocytes % 08/21/2022 26.5  % Final    Monocytes % 08/21/2022 13.1  % Final    Eosinophils % 08/21/2022 1.7  % Final    Basophils % 08/21/2022 0.6  % Final    Absolute Neutrophils 08/21/2022 4.0  1.8 - 7.8 10*9/L Final    Absolute Lymphocytes 08/21/2022 1.8  1.1 - 3.6 10*9/L Final    Absolute Monocytes 08/21/2022 0.9 (H)  0.3 - 0.8 10*9/L Final    Absolute Eosinophils 08/21/2022 0.1  0.0 - 0.5 10*9/L Final    Absolute Basophils 08/21/2022 0.0  0.0 - 0.1 10*9/L Final    Microcytosis 08/21/2022 Slight (A)  Not Present Final    Hypochromasia 08/21/2022 Moderate (A)  Not Present Final       Lab Results   Component Value Date    PSA 8.32 (H) 08/21/2022    PSA 1.66 06/26/2022    PSA 5.63 (H) 04/25/2022    PSA 0.50 01/28/2022    PSA 1.35 12/24/2021    PSA 1.44 12/05/2021    PSA 2.67 11/12/2021    PSA 4.78 (H) 10/22/2021    PSA 21.05 (H) 09/17/2021    PSA 64.37 (H) 08/27/2021       Orders placed or performed in visit on 08/21/22    Clinic Appointment Request APP

## 2022-08-22 ENCOUNTER — Encounter: Payer: Self-pay | Admitting: Physician Assistant

## 2022-08-22 ENCOUNTER — Ambulatory Visit (INDEPENDENT_AMBULATORY_CARE_PROVIDER_SITE_OTHER): Payer: Medicaid Other | Admitting: Physician Assistant

## 2022-08-22 ENCOUNTER — Ambulatory Visit: Payer: Medicaid Other | Admitting: Physician Assistant

## 2022-08-22 VITALS — BP 132/85 | HR 90 | Ht 69.0 in | Wt 149.0 lb

## 2022-08-22 DIAGNOSIS — R339 Retention of urine, unspecified: Secondary | ICD-10-CM | POA: Diagnosis not present

## 2022-08-22 LAB — BLADDER SCAN AMB NON-IMAGING: Scan Result: 132

## 2022-08-22 NOTE — Progress Notes (Signed)
08/22/2022 3:25 PM   Louis Gardner 04/02/1961 [id number]  CC: Chief Complaint  Patient presents with   Follow-up    Pull and send   HPI: Louis Gardner is a 62 y.o. male with PMH metastatic prostate cancer on ADT, darolutamide, docetaxel managed by medical oncology at [facility] with a history of recurrent urinary retention who presents today for voiding trial after Foley catheter placement in the ED on 08/04/2022, 6 days after passing another outpatient voiding trial with me.   Foley catheter removed in the morning, see separate procedure note for details.  He returned to clinic in the afternoon.  He reports he has been able to void multiple times without difficulty.  PVR 132 mL, but he subsequently voided another time after this.  PMH: Past Medical History:  Diagnosis Date   Arthritis    Depression    GI bleeding    Hypertension    Pancreatic cancer    Sleep apnea     Surgical History: Past Surgical History:  Procedure Laterality Date   COLONOSCOPY     ESOPHAGOGASTRODUODENOSCOPY (EGD) WITH PROPOFOL N/A 03/19/2016   Procedure: ESOPHAGOGASTRODUODENOSCOPY (EGD) WITH PROPOFOL;  Surgeon: Christena Deem, MD;  Location: [facility] ENDOSCOPY;  Service: Endoscopy;  Laterality: N/A;    Home Medications:  Allergies as of 08/22/2022   No Known Allergies      Medication List        Accurate as of August 22, 2022  3:25 PM. If you have any questions, ask your nurse or doctor.          amLODipine 10 MG tablet Commonly known as: NORVASC Take 10 mg by mouth daily.   buPROPion 150 MG 12 hr tablet Commonly known as: WELLBUTRIN SR Take 150 mg by mouth 2 (two) times daily.   Calcium Carb-Cholecalciferol 600-20 MG-MCG Tabs Take by mouth.   hydrochlorothiazide 12.5 MG tablet Commonly known as: HYDRODIURIL Take 12.5 mg by mouth daily.   Nubeqa 300 MG tablet Generic drug: darolutamide Take by mouth.   pantoprazole 40 MG tablet Commonly known as: PROTONIX Take 40 mg by  mouth daily.   pravastatin 20 MG tablet Commonly known as: PRAVACHOL   tamsulosin 0.4 MG Caps capsule Commonly known as: FLOMAX Take 1 capsule by mouth daily.        Allergies:  No Known Allergies  Family History: No family history on file.  Social History:   reports that he has been smoking cigarettes. He has been exposed to tobacco smoke. He has never used smokeless tobacco. He reports current alcohol use of about 6.0 standard drinks of alcohol per week. He reports that he does not use drugs.  Physical Exam: BP 132/85   Pulse 90   Ht 5\' 9"  (1.753 m)   Wt 149 lb (67.6 kg)   BMI 22.00 kg/m   Constitutional:  Alert and oriented, no acute distress, nontoxic appearing HEENT: [place], AT Cardiovascular: No clubbing, cyanosis, or edema Respiratory: Normal respiratory effort, no increased work of breathing Skin: No rashes, bruises or suspicious lesions Neurologic: Grossly intact, no focal deficits, moving all 4 extremities Psychiatric: Normal mood and affect  Laboratory Data: Results for orders placed or performed in visit on 08/22/22  BLADDER SCAN AMB NON-IMAGING  Result Value Ref Range   Scan Result 132 ml    Assessment & Plan:   1. Urinary retention Recurrent urinary retention, though he passed his voiding trial today.  We discussed pursuing urodynamics for further evaluation of his bladder function  and he is in agreement with this plan.  We discussed that if his urinary retention is obstructive in nature, we may consider outlet procedures, however bladder atony would require Foley catheterization versus CIC. - BLADDER SCAN AMB NON-IMAGING - Ambulatory referral to Urology   Return for Will call with results.  Carman Ching, PA-C  [facility] [place] [address] [place], [place] [address] [phone]

## 2022-08-22 NOTE — Progress Notes (Signed)
Catheter Removal  Patient is present today for a catheter removal.  10 ml of water was drained from the balloon. A 16 FR foley cath was removed from the bladder, no complications were noted. Patient tolerated well.  Performed by: Randa Lynn, RMA  Follow up/ Additional notes: this afternoon for a voiding trial

## 2022-09-04 NOTE — Unmapped (Signed)
[facility] Refill Coordination Note    Specialty Medication(s) to be Shipped:   Hematology/Oncology: nubeqa 300mg     Other medication(s) to be shipped: No additional medications requested for fill at this time     Mark Stephenson, DOB: 04-29-1961  Phone: [phone] (work)      All above HIPAA information was verified with patient.     Was a Nurse, learning disability used for this call? No    Completed refill call assessment today to schedule patient's medication shipment from the [facility] [phone]).  All relevant notes have been reviewed.     Specialty medication(s) and dose(s) confirmed: Regimen is correct and unchanged.   Changes to medications: Mark Stephenson reports no changes at this time.  Changes to insurance: No  New side effects reported not previously addressed with a pharmacist or physician: None reported  Questions for the pharmacist: No    Confirmed patient received a Conservation officer, historic buildings and a Surveyor, mining with first shipment. The patient will receive a drug information handout for each medication shipped and additional FDA Medication Guides as required.       DISEASE/MEDICATION-SPECIFIC INFORMATION        N/A    SPECIALTY MEDICATION ADHERENCE     Medication Adherence    Patient reported X missed doses in the last month: 0  Specialty Medication: Nubeqa 300 mg  Patient is on additional specialty medications: No  Patient is on more than two specialty medications: No  Any gaps in refill history greater than 2 weeks in the last 3 months: no  Demonstrates understanding of importance of adherence: yes  Informant: patient  Reliability of informant: reliable  Provider-estimated medication adherence level: good  Patient is at risk for Non-Adherence: No  Reasons for non-adherence: no problems identified  Confirmed plan for next specialty medication refill: delivery by pharmacy  Refills needed for supportive medications: not needed          Refill Coordination    Has the Patients' Contact Information Changed: No  Is the Shipping Address Different: No         Were doses missed due to medication being on hold? No    nubeqa 300 mg: 14 days of medicine on hand       REFERRAL TO PHARMACIST     Referral to the pharmacist: Not needed      [facility]     Shipping address confirmed in Epic.       Delivery Scheduled: Yes, Expected medication delivery date: 05/09.     Medication will be delivered via Next Day Courier to the prescription address in Epic WAM.    Mark Stephenson   [facility] Specialty Technician

## 2022-09-12 MED FILL — NUBEQA 300 MG TABLET: ORAL | 30 days supply | Qty: 120 | Fill #1

## 2022-09-16 ENCOUNTER — Ambulatory Visit: Admit: 2022-09-16 | Discharge: 2022-09-17 | Payer: PRIVATE HEALTH INSURANCE

## 2022-09-16 ENCOUNTER — Other Ambulatory Visit: Admit: 2022-09-16 | Discharge: 2022-09-17 | Payer: PRIVATE HEALTH INSURANCE

## 2022-09-16 DIAGNOSIS — C61 Malignant neoplasm of prostate: Principal | ICD-10-CM

## 2022-09-16 DIAGNOSIS — C775 Secondary and unspecified malignant neoplasm of intrapelvic lymph nodes: Principal | ICD-10-CM

## 2022-09-16 DIAGNOSIS — N401 Enlarged prostate with lower urinary tract symptoms: Principal | ICD-10-CM

## 2022-09-16 DIAGNOSIS — R338 Other retention of urine: Principal | ICD-10-CM

## 2022-09-16 LAB — COMPREHENSIVE METABOLIC PANEL
ALBUMIN: 3.8 g/dL (ref 3.4–5.0)
ALKALINE PHOSPHATASE: 102 U/L (ref 46–116)
ALT (SGPT): 8 U/L — ABNORMAL LOW (ref 10–49)
ANION GAP: 7 mmol/L (ref 5–14)
AST (SGOT): 18 U/L (ref <=34)
BILIRUBIN TOTAL: 1.4 mg/dL — ABNORMAL HIGH (ref 0.3–1.2)
BLOOD UREA NITROGEN: 12 mg/dL (ref 9–23)
BUN / CREAT RATIO: 14
CALCIUM: 10.1 mg/dL (ref 8.7–10.4)
CHLORIDE: 89 mmol/L — ABNORMAL LOW (ref 98–107)
CO2: 31 mmol/L (ref 20.0–31.0)
CREATININE: 0.86 mg/dL
EGFR CKD-EPI (2021) MALE: >90 mL/min/{1.73_m2} (ref >=60)
GLUCOSE RANDOM: 146 mg/dL (ref 70–179)
POTASSIUM: 3.2 mmol/L — ABNORMAL LOW (ref 3.5–5.1)
PROTEIN TOTAL: 8.8 g/dL — ABNORMAL HIGH (ref 5.7–8.2)
SODIUM: 127 mmol/L — ABNORMAL LOW (ref 135–145)

## 2022-09-16 LAB — URINALYSIS WITH MICROSCOPY
BILIRUBIN UA: NEGATIVE
GLUCOSE UA: NEGATIVE
KETONES UA: NEGATIVE
NITRITE UA: NEGATIVE
PH UA: 5.5 (ref 5.0–9.0)
PROTEIN UA: 30 — AB
RBC UA: >182 /HPF — ABNORMAL HIGH (ref <=3)
SPECIFIC GRAVITY UA: 1.019 (ref 1.003–1.030)
SQUAMOUS EPITHELIAL: 1 /HPF (ref 0–5)
UROBILINOGEN UA: <2
WBC UA: >182 /HPF — ABNORMAL HIGH (ref <=2)

## 2022-09-16 LAB — CBC W/ AUTO DIFF
BASOPHILS ABSOLUTE COUNT: 0 10*9/L (ref 0.0–0.1)
BASOPHILS RELATIVE PERCENT: 0.5 %
EOSINOPHILS ABSOLUTE COUNT: 0 10*9/L (ref 0.0–0.5)
EOSINOPHILS RELATIVE PERCENT: 0.3 %
HEMATOCRIT: 37.9 % — ABNORMAL LOW (ref 39.0–48.0)
HEMOGLOBIN: 12.6 g/dL — ABNORMAL LOW (ref 12.9–16.5)
LYMPHOCYTES ABSOLUTE COUNT: 1.7 10*9/L (ref 1.1–3.6)
LYMPHOCYTES RELATIVE PERCENT: 22.6 %
MEAN CORPUSCULAR HEMOGLOBIN CONC: 33.1 g/dL (ref 32.0–36.0)
MEAN CORPUSCULAR HEMOGLOBIN: 25.9 pg (ref 25.9–32.4)
MEAN CORPUSCULAR VOLUME: 78.1 fL (ref 77.6–95.7)
MEAN PLATELET VOLUME: 7.8 fL (ref 6.8–10.7)
MONOCYTES ABSOLUTE COUNT: 1.3 10*9/L — ABNORMAL HIGH (ref 0.3–0.8)
MONOCYTES RELATIVE PERCENT: 17.8 %
NEUTROPHILS ABSOLUTE COUNT: 4.3 10*9/L (ref 1.8–7.8)
NEUTROPHILS RELATIVE PERCENT: 58.8 %
PLATELET COUNT: 372 10*9/L (ref 150–450)
RED BLOOD CELL COUNT: 4.85 10*12/L (ref 4.26–5.60)
RED CELL DISTRIBUTION WIDTH: 14.4 % (ref 12.2–15.2)
WBC ADJUSTED: 7.3 10*9/L (ref 3.6–11.2)

## 2022-09-16 LAB — OSMOLALITY, RANDOM URINE: OSMOLALITY URINE: 622 mosm/kg

## 2022-09-16 LAB — SODIUM, URINE, RANDOM: SODIUM URINE: 66 mmol/L

## 2022-09-16 LAB — POTASSIUM, URINE, RANDOM: POTASSIUM URINE: 59.6 mmol/L

## 2022-09-16 LAB — PSA: PROSTATE SPECIFIC ANTIGEN: 7.66 ng/mL — ABNORMAL HIGH (ref 0.00–4.00)

## 2022-09-16 NOTE — Unmapped (Signed)
Please remember to exercise daily and eat a heart-healthy diet.    For bone health while on hormone therapy, please take 800mg of calcium and 1200 international units of Vitamin D daily.    For questions/concerns please call the nurse triage line [phone] on Monday-Friday 8am-5pm.  On nights, weekends, and holidays, please call [phone].    Joye Wesenberg C. Christepher Melchior, MD, PhD  Assistant Professor of Medicine, Division of Oncology  [facility]  Genitourinary Oncology Clinic  Fax: [phone]

## 2022-09-16 NOTE — Unmapped (Addendum)
[facility] Visit Note    Patient Name: Mark Stephenson  Patient Age: 62 y.o.  Encounter Date: 09/16/2022  Attending Provider:  Laurette Schimke. 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 M with HTN, chronic pancreatitis and high volume mHSPC, now mCRPC, who was treated per the ARASENS trial with docetaxel + ADT + darolutamide.  He completed chemotherapy in fall 2023.  PSA is trending up on ADT + darolutamide. PSMA-PET scan scheduled for 09/25/22.    He is complaining of a sharp pain in his left chest under the breast today, sometimes worse with inspiration.  He says it has been happening about a week. He went to a local urgent care and had an EKG that was reportedly negative.  No imaging was done per his report.    Labs today demonstrate hyponatremia and hypokalemia with mildly elevated total protein and total bilirubin.    Recommendations  1) Prostate cancer -     Imaging:  - PSMA-PET scan with widely metastatic disease noted at baseline. Now with repeat PET s/p chemo completion, reporting great response to treatment. 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 Asymptomatic (no abdominal pain, nausea, vomiting, diarrhea)  - monitor    7) Hyponatremia - new hyponatremia and hypokalemia, patient refused IV fluids today.  - check urine electrolytes  - stop hydrochlorothiazide  - recheck labs in 1 week locally    Update: urine electrolytes difficult to interpret in setting of thiazide use. UA does show evidence of infection, so prescribed 7 day course of ciprofloxacin 500mg  q12H to treat.    8) chest pain - reported negative EKG, patient refused to go to emergency room to evaluate for acute coronary syndrome or pulmonary embolus. Pain reproducible with palpation, no visual changes of the skin  over the ribs.  Suspect musculoskeletal origin.  - advised him to go to local emergency room if symptoms become work    I personally spent 45 minutes face-to-face and non-face-to-face in the care of this patient, which includes all pre, intra, and post visit time on the date of service.  All documented time was specific to the E/M visit and does not include any procedures that may have been performed.     Laurette Schimke Hyacinth Meeker, MD, PhD  Assistant Professor of Medicine, Division of Oncology  [facility]  Genitourinary Oncology Clinic      Reason for Visit  The patient is seen in consultation at the request of Kennieth Francois, FNP for evaluation of prostate cancer.    History of Present Illness:  Hematology/Oncology History Overview Note   03/19/21 - CT A/P with contrast performed to follow up on pancreatic mass, with near resolution of pancreatic uncinate process. Bulky multistation abdominopelvic lymphadenopathy identified (e.g. left para-aortic node measuring 2.1cm and right common iliac node measuring 1.4cm). No concerning osseous lesions identified. Enlarged, heterogeneously enhancing prostate gland.  04/17/21 - PSA 48.83  05/29/21 - retroperitoneal lymph node core needle biopsy, pathology consistent with metastatic prostate adenocarcinoma.  06/27/21 - PSMA-PET scan with widely metastatic osseous disease as well as avid adenopathy extending from the pelvis to the left supraclavicular region consistent with metastatic prostate cancer. Noting that there is one single enlarged retroperitoneal node with low/minimal tracer avidity measuring up to 4 cm.  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)     06/04/2021 -  Cancer Staged    Staging form: Prostate, AJCC 8th Edition  - Clinical: Stage IVB (cT1c, cN1, cM1a, PSA: 25) - Signed by Davy Pique, MD on 06/04/2021       08/26/2021 - 12/24/2021 Chemotherapy    OP PROSTATE DOCETAXEL/PREDNISONE  Docetaxel 75 mg/m2 evry 21 days     08/27/2021 Endocrine/Hormone Therapy    OP PROSTATE LEUPROLIDE (ELIGARD) 45 MG EVERY 6 MONTHS  Plan Provider: Davy Pique, MD     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  Refusing ER evaluation today for PE, ACS.    Allergies:  No Known Allergies    Current Medications:    Current Outpatient Medications:     amlodipine (NORVASC) 10 MG tablet, Take 1 tablet (10 mg total) by mouth daily., Disp: 90 tablet, Rfl: 0    BINAXNOW COVID-19 AG SELF TEST Kit, TEST AS DIRECTED TODAY, Disp: , Rfl:     buPROPion (WELLBUTRIN SR) 150 MG 12 hr tablet, Take 1 tablet (150 mg total) by mouth., Disp: , Rfl:     calcium carbonate-vitamin D3 600 mg-20 mcg (800 unit) Tab, Take 1 tablet by mouth two (2) times a day., Disp: 60 tablet, Rfl: 11  darolutamide (NUBEQA) 300 mg tablet, Take 2 tablets (600 mg total) by mouth in the morning and 2 tablets (600 mg total) in the evening. Take with food. Swallow tablets whole., Disp: 120 tablet, Rfl: 11    hydroCHLOROthiazide (HYDRODIURIL) 25 MG tablet, Take 1 tablet (25 mg total) by mouth daily., Disp: 90 tablet, Rfl: 0    pantoprazole (PROTONIX) 40 MG tablet, Take 1 tablet (40 mg total) by mouth., Disp: , Rfl:     pravastatin (PRAVACHOL) 20 MG tablet, Take 1 tablet (20 mg total) by mouth daily., Disp: 90 tablet, Rfl: 0    tamsulosin (FLOMAX) 0.4 mg capsule, Take 1 capsule (0.4 mg total) by mouth daily., Disp: 90 capsule, Rfl: 0    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)    Anemia of chronic disease    Gallstones and inflammation of gallbladder without obstruction    Enlarged prostate with urinary retention        Past Surgical History:   Procedure Laterality Date    CHG X-RAY FOR BILE DUCT ENDOSCOPY  12/08/2019    Procedure: ENDOSCOPIC CATHETERIZATION OF THE BILIARY DUCTAL SYSTEM, RADIOLOGICAL SUPERVISION AND INTERPRETATION;  Surgeon: Chriss Driver, MD;  [facility];  Service: Gastroenterology    PR ERCP,SPHINCTEROTOMY N/A 12/08/2019    Procedure: ERCP; W/SPHINCTEROTOMY/PAPILLOTOMY;  Surgeon: Chriss Driver, MD;  Location: [facility];  Service: Gastroenterology    PR UPGI ENDOSCOPY W/US FN BX N/A 12/08/2019    Procedure: UGI W/ TRANSENDOSCOPIC ULTRASOUND GUIDED INTRAMURAL/TRANSMURAL FINE NEEDLE ASPIRATION/BIOPSY(S), ESOPHAGUS;  Surgeon: Chriss Driver, MD;  Location[facility];  Service: Gastroenterology        Social History     Occupational History    Occupation: disability   Tobacco Use    Smoking status: Every Day    Smokeless tobacco: Never    Tobacco comments:     1 pack per week. reports gradually.    Vaping Use    Vaping status: Never Used   Substance and Sexual Activity    Alcohol use: Yes     Alcohol/week: 21.0 standard drinks of alcohol     Types: 21 Cans of beer per week     Comment: 3x 40-once cans of beer per day    Drug use: No    Sexual activity: Not on file       Family History  Family History   Problem Relation Age of Onset    Cancer Father     Cancer Brother     Anesthesia problems Neg Hx      Prostate Cancer Family History Assessment:  History of cancer in children (yes/no; if yes, what type AND age of diagnosis): no children  Total number of siblings (including deceased): 4 brothers, 2 sisters  History of cancer in siblings (yes/no; if yes, provide relation, type of cancer, AND age of diagnosis): oldest brother had unknown type of cancer  History of cancer in parents (yes/no; if yes, please specify parent, type of cancer, AND age of diagnosis): mother - unknown, father passed from  unknown cancer  History of cancer in aunts/uncles/grandparents (yes/no; if yes, provide relation, type of cancer, AND age of diagnosis): none known      Review of Systems:  A comprehensive review of 10 systems was negative except for pertinent positives noted in HPI.    Physical Exam:  VITAL SIGNS:  BP 140/81  - Pulse 85  - Temp 35.7 ??C (96.3 ??F) (Temporal)  - Ht 175.3 cm (5' 9)  - Wt 72.1 kg (158 lb 14.4 oz)  - SpO2 100%  - BMI 23.47 kg/m??   ECOG Performance Status: 0  GENERAL: Well-developed, well-nourished patient in no acute distress.  HEAD: Normocephalic and atraumatic.  EYES: Conjunctivae are normal. No scleral icterus.  MOUTH/THROAT: Oropharynx is clear and moist.  No mucosal lesions. Poor dentition, with tooth on the bottom right decaying.  NECK: Supple, no thyromegaly.  CARDIOVASCULAR: Normal rate, regular rhythm and normal heart sounds.  Exam reveals no gallop and no friction rub.  No murmur heard.  PULMONARY/CHEST: Effort normal and breath sounds normal. No respiratory distress. + pain with palpation under left breast  ABDOMINAL:  Soft. There is no distension. There is no tenderness. There is no rebound and no guarding.  MUSCULOSKELETAL: No clubbing, cyanosis, or lower extremity edema.  PSYCHIATRIC: Alert and oriented.  Normal mood and affect.  NEUROLOGIC: No focal motor deficit. Normal gait.  SKIN: Skin is warm, dry, and intact. No skin changes over chest.      Results/Orders:    No visits with results within 2 Week(s) from this visit.   Latest known visit with results is:   Lab on 08/21/2022   Component Date Value Ref Range Status    Sodium 08/21/2022 137  135 - 145 mmol/L Final    Potassium 08/21/2022 4.4  3.5 - 5.1 mmol/L Final    Chloride 08/21/2022 104  98 - 107 mmol/L Final    CO2 08/21/2022 25.0  20.0 - 31.0 mmol/L Final    Anion Gap 08/21/2022 8  5 - 14 mmol/L Final    BUN 08/21/2022 7 (L)  9 - 23 mg/dL Final    Creatinine 45/40/9811 0.91  0.73 - 1.18 mg/dL Final    BUN/Creatinine Ratio 08/21/2022 8   Final    eGFR CKD-EPI (2021) Male 08/21/2022 >90  >=60 mL/min/1.89m2 Final    eGFR calculated with CKD-EPI 2021 equation in accordance with [employer] and [employer] of Nephrology Task Force recommendations.    Glucose 08/21/2022 130  70 - 179 mg/dL Final    Calcium 91/47/8295 10.3  8.7 - 10.4 mg/dL Final    Albumin 62/13/0865 4.0  3.4 - 5.0 g/dL Final    Total Protein 08/21/2022 8.2  5.7 - 8.2 g/dL Final    Total Bilirubin 08/21/2022 1.1  0.3 - 1.2 mg/dL Final    AST 78/46/9629 24  <=34 U/L Final    ALT 08/21/2022 12  10 - 49 U/L Final    Alkaline Phosphatase 08/21/2022 100  46 - 116 U/L Final    PSA 08/21/2022 8.32 (H)  0.00 - 4.00 ng/mL Final    Testosterone 08/21/2022 31 (L)  188 - 684 ng/dL Final    WBC 52/84/1324 6.8  3.6 - 11.2 10*9/L Final    RBC 08/21/2022 4.76  4.26 - 5.60 10*12/L Final    HGB 08/21/2022 12.3 (L)  12.9 - 16.5 g/dL Final    HCT 40/02/2724 38.0 (L)  39.0 - 48.0 % Final    MCV 08/21/2022 79.7  77.6 -  95.7 fL Final    MCH 08/21/2022 25.9  25.9 - 32.4 pg Final    MCHC 08/21/2022 32.4  32.0 - 36.0 g/dL Final    RDW 16/02/9603 15.5 (H)  12.2 - 15.2 % Final    MPV 08/21/2022 7.4  6.8 - 10.7 fL Final    Platelet 08/21/2022 383  150 - 450 10*9/L Final    Neutrophils % 08/21/2022 58.1  % Final    Lymphocytes % 08/21/2022 26.5  % Final    Monocytes % 08/21/2022 13.1  % Final    Eosinophils % 08/21/2022 1.7  % Final    Basophils % 08/21/2022 0.6  % Final    Absolute Neutrophils 08/21/2022 4.0  1.8 - 7.8 10*9/L Final    Absolute Lymphocytes 08/21/2022 1.8  1.1 - 3.6 10*9/L Final    Absolute Monocytes 08/21/2022 0.9 (H)  0.3 - 0.8 10*9/L Final    Absolute Eosinophils 08/21/2022 0.1  0.0 - 0.5 10*9/L Final    Absolute Basophils 08/21/2022 0.0  0.0 - 0.1 10*9/L Final    Microcytosis 08/21/2022 Slight (A)  Not Present Final    Hypochromasia 08/21/2022 Moderate (A)  Not Present Final       Lab Results   Component Value Date    PSA 8.32 (H) 08/21/2022    PSA 1.66 06/26/2022    PSA 5.63 (H) 04/25/2022    PSA 0.50 01/28/2022    PSA 1.35 12/24/2021    PSA 1.44 12/05/2021    PSA 2.67 11/12/2021    PSA 4.78 (H) 10/22/2021    PSA 21.05 (H) 09/17/2021    PSA 64.37 (H) 08/27/2021       Orders placed or performed in visit on 09/16/22    Clinic Appointment Request Physician

## 2022-09-17 MED ORDER — CIPROFLOXACIN 500 MG TABLET
ORAL_TABLET | Freq: Two times a day (BID) | ORAL | 0 refills | 7 days | Status: CP
Start: 2022-09-17 — End: ?

## 2022-09-17 NOTE — Unmapped (Signed)
Addended by: Eber Hong C on: 09/17/2022 10:02 AM     Modules accepted: Orders

## 2022-09-23 NOTE — Unmapped (Signed)
Called patient with reminder to get labs today.  Left message on vm.

## 2022-09-25 ENCOUNTER — Ambulatory Visit: Admit: 2022-09-25 | Discharge: 2022-09-26 | Payer: PRIVATE HEALTH INSURANCE

## 2022-09-25 MED ADMIN — F-18 Piflufolastat (Pylarify): 9 | INTRAVENOUS | @ 19:00:00 | Stop: 2022-09-25

## 2022-09-25 NOTE — Unmapped (Signed)
I called patient to explain that he needs a repeat of labs from last week due to low Na+ and low K+.  Patient said he can't come any either than 2:30p tomorrow bc he is been transported by another person.    I reached out to [facility] to see if he could possibly go get labs in between the two parts of his scan tomorrow.

## 2022-09-25 NOTE — Unmapped (Signed)
Patient can't get to the hospital any earlier than 2:30p bc he has to rely on others for transport.    He states he will go tomorrow to his PCP for repeat blood draw and I will follow up on Friday.    Fax sent to patient's PCP office.

## 2022-09-27 NOTE — Unmapped (Signed)
Sent fax to patient's PCP office for repeat of CMP.  Ask them to fax results back to [place].

## 2022-09-27 NOTE — Unmapped (Signed)
Called patient and ask him if he had been to his PCP to get blood redraw.  He has not been. He states everything is fine. I told him DR. Hyacinth Stephenson wanted to repeat his labs due to low Na+ and K+.    Patient states he will go to his PCP office for blood work, but I'm unsure if he will go.

## 2022-10-02 NOTE — Unmapped (Signed)
Called patient's PCP office to see if patient went in for lab draw.  I could not get anyone to answer the phone at his PCP office.

## 2022-10-04 NOTE — Unmapped (Signed)
[facility] Refill Coordination Note    Specialty Medication(s) to be Shipped:   Hematology/Oncology: Nubeqa    Other medication(s) to be shipped: No additional medications requested for fill at this time     Mark Stephenson, DOB: [birth year]  Phone: [phone] (work)      All above HIPAA information was verified with patient.     Was a Nurse, learning disability used for this call? No    Completed refill call assessment today to schedule patient's medication shipment from the [facility] [phone]).  All relevant notes have been reviewed.     Specialty medication(s) and dose(s) confirmed: Regimen is correct and unchanged.   Changes to medications: [name] reports no changes at this time.  Changes to insurance: No  New side effects reported not previously addressed with a pharmacist or physician: None reported  Questions for the pharmacist: No    Confirmed patient received a Conservation officer, historic buildings and a Surveyor, mining with first shipment. The patient will receive a drug information handout for each medication shipped and additional FDA Medication Guides as required.       DISEASE/MEDICATION-SPECIFIC INFORMATION        N/A    SPECIALTY MEDICATION ADHERENCE     Medication Adherence    Patient reported X missed doses in the last month: 0  Specialty Medication: Nubeqa 300 mg  Patient is on additional specialty medications: No  Informant: patient     Were doses missed due to medication being on hold? No    Nubeqa 300 mg: 14 days of medicine on hand       REFERRAL TO PHARMACIST     Referral to the pharmacist: Not needed      [facility]     Shipping address confirmed in Epic.     Delivery Scheduled: Yes, Expected medication delivery date: 10/15/22.     Medication will be delivered via Next Day Courier to the prescription address in Epic [place].    Mark Stephenson   [facility] Specialty Technician

## 2022-10-14 MED FILL — NUBEQA 300 MG TABLET: ORAL | 30 days supply | Qty: 120 | Fill #2

## 2022-10-17 ENCOUNTER — Telehealth: Payer: Self-pay | Admitting: *Deleted

## 2022-10-17 ENCOUNTER — Other Ambulatory Visit: Payer: Self-pay | Admitting: *Deleted

## 2022-10-17 DIAGNOSIS — Z1211 Encounter for screening for malignant neoplasm of colon: Secondary | ICD-10-CM

## 2022-10-17 MED ORDER — NA SULFATE-K SULFATE-MG SULF 17.5-3.13-1.6 GM/177ML PO SOLN: 1.0000 | Freq: Once | ORAL | 0 refills | Status: AC

## 2022-10-17 NOTE — Telephone Encounter (Signed)
Gastroenterology Pre-Procedure Review  Request Date: 12/09/2022 Requesting Physician: Dr. Tobi Bastos  PATIENT REVIEW QUESTIONS: The patient responded to the following health history questions as indicated:    1. Are you having any GI issues? no 2. Do you have a personal history of Polyps? no 3. Do you have a family history of Colon Cancer or Polyps? no 4. Diabetes Mellitus? no 5. Joint replacements in the past 12 months?no 6. Major health problems in the past 3 months?no 7. Any artificial heart valves, MVP, or defibrillator?no    MEDICATIONS & ALLERGIES:    Patient reports the following regarding taking any anticoagulation/antiplatelet therapy:   Plavix, Coumadin, Eliquis, Xarelto, Lovenox, Pradaxa, Brilinta, or Effient? no Aspirin? no  Patient confirms/reports the following medications:  Current Outpatient Medications  Medication Sig Dispense Refill   amLODipine (NORVASC) 10 MG tablet Take 10 mg by mouth daily.     buPROPion (WELLBUTRIN SR) 150 MG 12 hr tablet Take 150 mg by mouth 2 (two) times daily.     Calcium Carb-Cholecalciferol 600-20 MG-MCG TABS Take by mouth.     hydrochlorothiazide (HYDRODIURIL) 12.5 MG tablet Take 12.5 mg by mouth daily.     NUBEQA 300 MG tablet Take by mouth.     pantoprazole (PROTONIX) 40 MG tablet Take 40 mg by mouth daily.     pravastatin (PRAVACHOL) 20 MG tablet      tamsulosin (FLOMAX) 0.4 MG CAPS capsule Take 1 capsule by mouth daily.     No current facility-administered medications for this visit.    Patient confirms/reports the following allergies:  No Known Allergies  No orders of the defined types were placed in this encounter.   AUTHORIZATION INFORMATION Primary Insurance: 1D#: Group #:  Secondary Insurance: 1D#: Group #:  SCHEDULE INFORMATION: Date: 12/09/2022 Time: Location:  [facility]

## 2022-10-21 ENCOUNTER — Ambulatory Visit: Payer: Medicaid Other | Admitting: Cardiology

## 2022-10-22 ENCOUNTER — Other Ambulatory Visit: Payer: Self-pay

## 2022-10-22 ENCOUNTER — Encounter: Payer: Self-pay | Admitting: Emergency Medicine

## 2022-10-22 ENCOUNTER — Emergency Department
Admission: EM | Admit: 2022-10-22 | Discharge: 2022-10-22 | Disposition: A | Discharge status: Home / Self Care | Payer: Medicaid Other | Attending: Emergency Medicine | Admitting: Emergency Medicine

## 2022-10-22 DIAGNOSIS — C61 Malignant neoplasm of prostate: Principal | ICD-10-CM

## 2022-10-22 DIAGNOSIS — C775 Secondary and unspecified malignant neoplasm of intrapelvic lymph nodes: Principal | ICD-10-CM

## 2022-10-22 DIAGNOSIS — R339 Retention of urine, unspecified: Secondary | ICD-10-CM

## 2022-10-22 DIAGNOSIS — Z8546 Personal history of malignant neoplasm of prostate: Secondary | ICD-10-CM | POA: Diagnosis not present

## 2022-10-22 LAB — URINALYSIS, ROUTINE W REFLEX MICROSCOPIC
Bilirubin Urine: NEGATIVE
Glucose, UA: NEGATIVE mg/dL
Ketones, ur: NEGATIVE mg/dL
Leukocytes,Ua: NEGATIVE
Nitrite: NEGATIVE
Protein, ur: NEGATIVE mg/dL
Specific Gravity, Urine: 1.011 (ref 1.005–1.030)
pH: 5 (ref 5.0–8.0)

## 2022-10-22 NOTE — ED Notes (Signed)
Bladder scan 251

## 2022-10-22 NOTE — Discharge Instructions (Signed)
Please follow-up with urology for a voiding trial and removal of your Foley catheter.  Please return for any new, worsening, or change in symptoms or other concerns.  It was a pleasure caring for you today.

## 2022-10-22 NOTE — ED Provider Notes (Signed)
[facility] Provider Note    Event Date/Time   First MD Initiated Contact with Patient 10/22/22 [phone]     (approximate)   History   Urinary Retention   HPI  Louis Gardner is a 62 y.o. male with a past medical history of metastatic prostate cancer currently under the care of [facility] with a history of recurrent urinary retention who presents today for evaluation of urinary retention.  Patient reports that this is a recurrent problem for him, and his symptoms most recently began 2 days ago.  He reports that he is able to dribble a small amount of urine but does not feel like he is emptying his bladder completely.  He denies any burning when he urinates.  No flank pain.  No fevers or chills.  No hematuria.  There are no problems to display for this patient.         Physical Exam   Triage Vital Signs: ED Triage Vitals [10/22/22 0708]  Enc Vitals Group     BP 135/85     Pulse Rate 93     Resp 16     Temp 98.3 F (36.8 C)     Temp src      SpO2 100 %     Weight 153 lb (69.4 kg)     Height 5\' 9"  (1.753 m)     Head Circumference      Peak Flow      Pain Score 7     Pain Loc      Pain Edu?      Excl. in GC?     Most recent vital signs: Vitals:   10/22/22 0708  BP: 135/85  Pulse: 93  Resp: 16  Temp: 98.3 F (36.8 C)  SpO2: 100%    Physical Exam Vitals and nursing note reviewed.  Constitutional:      General: Awake and alert. No acute distress.    Appearance: Normal appearance. The patient is normal weight.  HENT:     Head: Normocephalic and atraumatic.     Mouth: Mucous membranes are moist.  Eyes:     General: PERRL. Normal EOMs        Right eye: No discharge.        Left eye: No discharge.     Conjunctiva/sclera: Conjunctivae normal.  Cardiovascular:     Rate and Rhythm: Normal rate and regular rhythm.     Pulses: Normal pulses.  Pulmonary:     Effort: Pulmonary effort is normal. No respiratory distress.     Breath sounds:  Normal breath sounds.  Abdominal:     Abdomen is soft. There is mild suprapubic abdominal tenderness and suprapubic distention. No rebound or guarding.  Easily reducible umbilical hernia without skin changes.  Soft and nontender. Musculoskeletal:        General: No swelling. Normal range of motion.     Cervical back: Normal range of motion and neck supple.  Skin:    General: Skin is warm and dry.     Capillary Refill: Capillary refill takes less than 2 seconds.     Findings: No rash.  Neurological:     Mental Status: The patient is awake and alert.      ED Results / Procedures / Treatments   Labs (all labs ordered are listed, but only abnormal results are displayed) Labs Reviewed  URINALYSIS, ROUTINE W REFLEX MICROSCOPIC - Abnormal; Notable for the following components:      Result Value  Color, Urine YELLOW (*)    APPearance CLEAR (*)    Hgb urine dipstick SMALL (*)    Bacteria, UA RARE (*)    All other components within normal limits     EKG     RADIOLOGY     PROCEDURES:  Critical Care performed:   Procedures   MEDICATIONS ORDERED IN ED: Medications - No data to display   IMPRESSION / MDM / ASSESSMENT AND PLAN / ED COURSE  I reviewed the triage vital signs and the nursing notes.   Differential diagnosis includes, but is not limited to, urinary retention, urinary tract infection, worsening prostatic tumor burden.  I reviewed the patient's chart.  Patient was seen in the emergency department on 3/31 for urinary retention and he was given a Foley catheter and leg bag.  Patient presented on 07/17/2022 with the same complaint.  Patient also had a urology visit on 08/22/2022.  Per their note, patient has a known history of metastatic prostate cancer on ADT, darolutamide, docetaxel managed by medical oncology at [facility].    Patient is awake and alert, mildly uncomfortable appearing, and keeps rushing to the bathroom to dribble a small amount of urine.  Urine does not  appear to be overtly infected.  Patient is requesting Foley catheter, which was placed with moderate urine output.  Patient reports feeling significantly improved after placement of the Foley catheter.  I recommended that he undergo further workup including blood work and imaging for evaluation of kidney function, worsening tumor burden, infection, etc. the patient declined.  He reports that he has an appointment with his urologist/medical oncologist on 6/21 and is having imaging and blood work done there and he would not like to get it done today.  This is reasonable.  Patient wants Foley catheter only today.  His Foley catheter was placed without difficulty, and patient was discharged in stable condition.   Patient's presentation is most consistent with acute complicated illness / injury requiring diagnostic workup.    FINAL CLINICAL IMPRESSION(S) / ED DIAGNOSES   Final diagnoses:  Urinary retention     Rx / DC Orders   ED Discharge Orders     None        Note:  This document was prepared using Dragon voice recognition software and may include unintentional dictation errors.   Keturah Shavers 10/22/22 1143    Chesley Noon, MD 10/22/22 1620

## 2022-10-22 NOTE — ED Notes (Signed)
Leg bag given to pt. Pt also provided with a standard leg bag for sleep. Pt educated on how to change out bag, pt agreeable.

## 2022-10-22 NOTE — ED Triage Notes (Addendum)
Pt to ED for urinary retention for one week, states only urinating a little. Reports pain with urination. Reports hx prostate issues.

## 2022-10-24 ENCOUNTER — Encounter: Payer: Self-pay | Admitting: Cardiology

## 2022-10-24 ENCOUNTER — Ambulatory Visit: Payer: Medicaid Other | Attending: Cardiology | Admitting: Cardiology

## 2022-10-24 VITALS — BP 118/82 | HR 84 | Ht 69.0 in | Wt 153.0 lb

## 2022-10-24 DIAGNOSIS — R072 Precordial pain: Secondary | ICD-10-CM

## 2022-10-24 DIAGNOSIS — I10 Essential (primary) hypertension: Secondary | ICD-10-CM | POA: Diagnosis not present

## 2022-10-24 NOTE — Progress Notes (Addendum)
Cardiology Office Note:    Date:  10/24/2022   ID:  Louis Gardner, DOB [birth year], MRN [id number]  PCP:  Gorden Harms, PA-C   [facility] Providers Cardiologist:  Debbe Odea, MD     Referring MD: Gorden Harms, PA-C   Chief Complaint  Patient presents with   New Patient (Initial Visit)    New intermittent chest pain for 2 weeks.  Denies cardiac history.      History of Present Illness:    Louis Gardner is a 62 y.o. male with a hx of hypertension, former smoker x 10 years, hyperlipidemia, prostate cancer who presented due to chest pain.   Patient was moving furniture/his couch 2 weeks ago when he developed left-sided chest pain.  Denies any prior occurrence.  He massaged his chest and symptoms seem to improve.  Has not had any further episodes of chest pain.  Denies any family history of chest pain.  He feels fine currently, denies shortness of breath, edema.  Compliant with medications for blood pressure and cholesterol as prescribed.  Past Medical History:  Diagnosis Date   Arthritis    Depression    GI bleeding    Hypertension    Pancreatic cancer (HCC)    Sleep apnea     Past Surgical History:  Procedure Laterality Date   COLONOSCOPY     ESOPHAGOGASTRODUODENOSCOPY (EGD) WITH PROPOFOL N/A 03/19/2016   Procedure: ESOPHAGOGASTRODUODENOSCOPY (EGD) WITH PROPOFOL;  Surgeon: Christena Deem, MD;  Location: [facility] ENDOSCOPY;  Service: Endoscopy;  Laterality: N/A;    Current Medications: Current Meds  Medication Sig   amLODipine (NORVASC) 10 MG tablet Take 10 mg by mouth daily.   buPROPion (WELLBUTRIN SR) 150 MG 12 hr tablet Take 150 mg by mouth 2 (two) times daily.   Calcium Carb-Cholecalciferol 600-20 MG-MCG TABS Take by mouth.   hydrochlorothiazide (HYDRODIURIL) 12.5 MG tablet Take 12.5 mg by mouth daily.   NUBEQA 300 MG tablet Take by mouth.   pantoprazole (PROTONIX) 40 MG tablet Take 40 mg by mouth daily.   pravastatin (PRAVACHOL) 20 MG  tablet    tamsulosin (FLOMAX) 0.4 MG CAPS capsule Take 1 capsule by mouth daily.     Allergies:   Patient has no known allergies.   Social History   Socioeconomic History   Marital status: Single    Spouse name: Not on file   Number of children: Not on file   Years of education: Not on file   Highest education level: Not on file  Occupational History   Not on file  Tobacco Use   Smoking status: Every Day    Packs/day: 0.25    Years: 48.00    Additional pack years: 0.00    Total pack years: 12.00    Types: Cigarettes    Passive exposure: Current   Smokeless tobacco: Never  Vaping Use   Vaping Use: Never used  Substance and Sexual Activity   Alcohol use: Yes    Alcohol/week: 6.0 standard drinks of alcohol    Types: 6 Cans of beer per week   Drug use: No   Sexual activity: Not on file  Other Topics Concern   Not on file  Social History Narrative   Not on file   Social Determinants of Health   Financial Resource Strain: Not on file  Food Insecurity: Not on file  Transportation Needs: Not on file  Physical Activity: Not on file  Stress: Not on file  Social Connections: Not on file  Family History: The patient's family history is not on file.  ROS:   Please see the history of present illness.     All other systems reviewed and are negative.  EKGs/Labs/Other Studies Reviewed:    The following studies were reviewed today:  EKG obtained today, EKG shows shows sinus rhythm, heart rate 84      Recent Labs: 12/19/2021: ALT 10; BUN <5; Creatinine, Ser 0.78; Hemoglobin 10.8; Magnesium 1.9; Platelets 650; Potassium 2.5; Sodium 133  Recent Lipid Panel    Component Value Date/Time   CHOL 138 02/25/2012 1002   TRIG 83 02/25/2012 1002   HDL 71 (H) 02/25/2012 1002   VLDL 17 02/25/2012 1002   LDLCALC 50 02/25/2012 1002     Risk Assessment/Calculations:             Physical Exam:    VS:  BP 118/82 (BP Location: Right Arm, Patient Position: Sitting, Cuff  Size: Normal)   Pulse 84   Ht 5\' 9"  (1.753 m)   Wt 153 lb (69.4 kg)   SpO2 99%   BMI 22.59 kg/m     Wt Readings from Last 3 Encounters:  10/24/22 153 lb (69.4 kg)  10/22/22 153 lb (69.4 kg)  08/22/22 149 lb (67.6 kg)     GEN:  Well nourished, well developed in no acute distress HEENT: Normal NECK: No JVD; No carotid bruits CARDIAC: RRR, no murmurs, rubs, gallops RESPIRATORY:  Clear to auscultation without rales, wheezing or rhonchi  ABDOMEN: Soft, non-tender, umbilical distention secondary to abdominal hernia noted MUSCULOSKELETAL:  No edema; No deformity  SKIN: Warm and dry NEUROLOGIC:  Alert and oriented x 3 PSYCHIATRIC:  Normal affect   ASSESSMENT:    1. Precordial pain   2. Primary hypertension    PLAN:    In order of problems listed above:  Chest pain, appears noncardiac/musculoskeletal from lifting furniture.  Obtain echo to rule out any structural abnormalities due to several risk factors.  If patient stays pain-free and echo with no significant structural abnormalities, recommend monitoring.  Consider additional testing if chest pain recurs or echocardiogram shows significant abnormalities. Hypertension, BP controlled.  Continue Norvasc 10 mg daily, HCTZ 12.5 mg daily.  Follow-up after echo      Medication Adjustments/Labs and Tests Ordered: Current medicines are reviewed at length with the patient today.  Concerns regarding medicines are outlined above.  Orders Placed This Encounter  Procedures   EKG 12-Lead   ECHOCARDIOGRAM COMPLETE   No orders of the defined types were placed in this encounter.   Patient Instructions  Medication Instructions:   Your physician recommends that you continue on your current medications as directed. Please refer to the Current Medication list given to you today.  *If you need a refill on your cardiac medications before your next appointment, please call your pharmacy*   Lab Work:  None Ordered  If you have labs  (blood work) drawn today and your tests are completely normal, you will receive your results only by: MyChart Message (if you have MyChart) OR A paper copy in the mail If you have any lab test that is abnormal or we need to change your treatment, we will call you to review the results.   Testing/Procedures:  Your physician has requested that you have an echocardiogram. Echocardiography is a painless test that uses sound waves to create images of your heart. It provides your doctor with information about the size and shape of your heart and how well your heart's chambers  and valves are working. This procedure takes approximately one hour. There are no restrictions for this procedure. Please do NOT wear cologne, perfume, aftershave, or lotions (deodorant is allowed). Please arrive 15 minutes prior to your appointment time.    Follow-Up: At [facility], you and your health needs are our priority.  As part of our continuing mission to provide you with exceptional heart care, we have created designated Provider Care Teams.  These Care Teams include your primary Cardiologist (physician) and Advanced Practice Providers (APPs -  Physician Assistants and Nurse Practitioners) who all work together to provide you with the care you need, when you need it.  We recommend signing up for the patient portal called "MyChart".  Sign up information is provided on this After Visit Summary.  MyChart is used to connect with patients for Virtual Visits (Telemedicine).  Patients are able to view lab/test results, encounter notes, upcoming appointments, etc.  Non-urgent messages can be sent to your provider as well.   To learn more about what you can do with MyChart, go to [email or web address].    Your next appointment:    After testing  Provider:   You may see Debbe Odea, MD or one of the following Advanced Practice Providers on your designated Care Team:   Nicolasa Ducking, NP Eula Listen,  PA-C Cadence Fransico Michael, PA-C Charlsie Quest, NP    Signed, Debbe Odea, MD  10/24/2022 12:31 PM    [facility]

## 2022-10-24 NOTE — Patient Instructions (Signed)
Medication Instructions:   Your physician recommends that you continue on your current medications as directed. Please refer to the Current Medication list given to you today.  *If you need a refill on your cardiac medications before your next appointment, please call your pharmacy*   Lab Work:  None Ordered  If you have labs (blood work) drawn today and your tests are completely normal, you will receive your results only by: MyChart Message (if you have MyChart) OR A paper copy in the mail If you have any lab test that is abnormal or we need to change your treatment, we will call you to review the results.   Testing/Procedures:  Your physician has requested that you have an echocardiogram. Echocardiography is a painless test that uses sound waves to create images of your heart. It provides your doctor with information about the size and shape of your heart and how well your heart's chambers and valves are working. This procedure takes approximately one hour. There are no restrictions for this procedure. Please do NOT wear cologne, perfume, aftershave, or lotions (deodorant is allowed). Please arrive 15 minutes prior to your appointment time.    Follow-Up: At  [facility], you and your health needs are our priority.  As part of our continuing mission to provide you with exceptional heart care, we have created designated Provider Care Teams.  These Care Teams include your primary Cardiologist (physician) and Advanced Practice Providers (APPs -  Physician Assistants and Nurse Practitioners) who all work together to provide you with the care you need, when you need it.  We recommend signing up for the patient portal called "MyChart".  Sign up information is provided on this After Visit Summary.  MyChart is used to connect with patients for Virtual Visits (Telemedicine).  Patients are able to view lab/test results, encounter notes, upcoming appointments, etc.  Non-urgent messages can  be sent to your provider as well.   To learn more about what you can do with MyChart, go to [email or web address].    Your next appointment:    After testing  Provider:   You may see Brian Agbor-Etang, MD or one of the following Advanced Practice Providers on your designated Care Team:   Christopher Berge, NP Ryan Dunn, PA-C Cadence Furth, PA-C Sheri Hammock, NP 

## 2022-10-28 ENCOUNTER — Institutional Professional Consult (permissible substitution): Admit: 2022-10-28 | Discharge: 2022-10-28 | Payer: PRIVATE HEALTH INSURANCE

## 2022-10-28 ENCOUNTER — Ambulatory Visit: Admit: 2022-10-28 | Discharge: 2022-10-28 | Payer: PRIVATE HEALTH INSURANCE

## 2022-10-28 ENCOUNTER — Other Ambulatory Visit: Admit: 2022-10-28 | Discharge: 2022-10-28 | Payer: PRIVATE HEALTH INSURANCE

## 2022-10-28 DIAGNOSIS — N3001 Acute cystitis with hematuria: Principal | ICD-10-CM

## 2022-10-28 DIAGNOSIS — C61 Malignant neoplasm of prostate: Principal | ICD-10-CM

## 2022-10-28 DIAGNOSIS — C775 Secondary and unspecified malignant neoplasm of intrapelvic lymph nodes: Principal | ICD-10-CM

## 2022-10-28 DIAGNOSIS — R300 Dysuria: Principal | ICD-10-CM

## 2022-10-28 LAB — COMPREHENSIVE METABOLIC PANEL
ALBUMIN: 3.4 g/dL (ref 3.4–5.0)
ALKALINE PHOSPHATASE: 116 U/L (ref 46–116)
ALT (SGPT): 7 U/L — ABNORMAL LOW (ref 10–49)
ANION GAP: 6 mmol/L (ref 5–14)
AST (SGOT): 16 U/L (ref <=34)
BILIRUBIN TOTAL: 0.7 mg/dL (ref 0.3–1.2)
BLOOD UREA NITROGEN: 12 mg/dL (ref 9–23)
BUN / CREAT RATIO: 13
CALCIUM: 10.6 mg/dL — ABNORMAL HIGH (ref 8.7–10.4)
CHLORIDE: 94 mmol/L — ABNORMAL LOW (ref 98–107)
CO2: 30 mmol/L (ref 20.0–31.0)
CREATININE: 0.91 mg/dL
EGFR CKD-EPI (2021) MALE: >90 mL/min/{1.73_m2} (ref >=60)
GLUCOSE RANDOM: 191 mg/dL — ABNORMAL HIGH (ref 70–179)
POTASSIUM: 3.3 mmol/L — ABNORMAL LOW (ref 3.5–5.1)
PROTEIN TOTAL: 8.8 g/dL — ABNORMAL HIGH (ref 5.7–8.2)
SODIUM: 130 mmol/L — ABNORMAL LOW (ref 135–145)

## 2022-10-28 LAB — URINALYSIS WITH MICROSCOPY
BILIRUBIN UA: NEGATIVE
GLUCOSE UA: NEGATIVE
KETONES UA: NEGATIVE
NITRITE UA: POSITIVE — AB
PH UA: 6 (ref 5.0–9.0)
PROTEIN UA: 100 — AB
RBC UA: >182 /HPF — ABNORMAL HIGH (ref <=3)
SPECIFIC GRAVITY UA: 1.023 (ref 1.003–1.030)
SQUAMOUS EPITHELIAL: <1 /HPF (ref 0–5)
UROBILINOGEN UA: <2
WBC UA: >182 /HPF — ABNORMAL HIGH (ref <=2)

## 2022-10-28 LAB — TESTOSTERONE: TESTOSTERONE TOTAL: 28 ng/dL — ABNORMAL LOW

## 2022-10-28 LAB — PSA: PROSTATE SPECIFIC ANTIGEN: 18.92 ng/mL — ABNORMAL HIGH (ref 0.00–4.00)

## 2022-10-28 MED ORDER — SENNOSIDES 8.6 MG-DOCUSATE SODIUM 50 MG TABLET
ORAL_TABLET | Freq: Every day | ORAL | 1 refills | 15 days | Status: CP | PRN
Start: 2022-10-28 — End: 2023-10-28

## 2022-10-28 MED ORDER — CIPROFLOXACIN 500 MG TABLET
ORAL_TABLET | Freq: Two times a day (BID) | ORAL | 0 refills | 7 days | Status: CP
Start: 2022-10-28 — End: ?

## 2022-10-28 MED ADMIN — leuprolide acetate (6 month) (ELIGARD) injection 45 mg: 45 mg | SUBCUTANEOUS | @ 16:00:00 | Stop: 2022-10-28

## 2022-10-28 NOTE — Unmapped (Signed)
Please remember to exercise daily and eat a heart-healthy diet.    Treatment options: Pluvicto or cabazitaxel chemotherapy.    Please stop darolutamide.    For bone health while on hormone therapy, please take 800mg  of calcium and 1200 international units of Vitamin D daily.    For questions/concerns please call the nurse triage line [phone] on Monday-Friday 8am-5pm.  On nights, weekends, and holidays, please call [phone].    Laurette Schimke Hyacinth Meeker, MD, PhD  Assistant Professor of Medicine, Division of Oncology  [facility]  Genitourinary Oncology Clinic  Fax: [phone]

## 2022-10-28 NOTE — Unmapped (Signed)
Patient received Eligard 45mg  SQ in Injection site: left lower abdomen. Band aid and guaze applied. Patient tolerated injection without complications and ambulated to checkout without any assistance

## 2022-10-28 NOTE — Unmapped (Signed)
Urine obtained and sent to lab  

## 2022-10-28 NOTE — Unmapped (Signed)
Mark Stephenson Visit Note    Patient Name: Mark Stephenson  Patient Age: 62 y.o.  Encounter Date: 10/28/2022  Attending Provider:  Laurette Schimke. 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 M with HTN, chronic pancreatitis and high volume mHSPC, now mCRPC, who was treated per the ARASENS trial with docetaxel + ADT + darolutamide.  He completed chemotherapy in fall 2023, now with recurrence on ADT + darolutamide.    He complains of pain with urination and difficulty starting urination, despite having a foley in place. He did not take the ciprofloxacin I prescribed him about a month ago.  He is also complaining of constipation.  He is drinking 2x 40 ounce beers daily and not eating much.    Recommendations  1) Prostate cancer - return in 4 weeks to start next line therapy    Imaging:  - PSMA-PET scan with widely metastatic disease noted at baseline. Repeat PET s/p chemo completion, reporting great response to treatment.   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  - offered Pluvicto vs cabazitaxel as next line therapy, patient would like to consider before deciding      - call in 2 weeks to see if he has decided    2) Bladder outlet obstruction - foley replaced recently, patient reports he has a urology follow-up for replacement in 4 weeks    3) Bone health  - DEXA scan with osteopenia (Frax 2.3%), repeat in Feb 2024  - continue calcium 800mg  daily + vitamin D 1200 international units daily    4) Metabolic health  - recommended daily exercise and heart-healthy diet for metabolic health while on ADT    5) Gallstones: visualized on PET imaging, [place] negative for evidence of obstruction. Asymptomatic (no abdominal pain, nausea, vomiting, diarrhea)  - monitor    7) Hyponatremia - continued hyponatremia and hypokalemia, hypercalcemia  - stop hydrochlorothiazide  - PCP started potassium supplementation    8) Dysurea - UA with evidence of infection, prescribed 7 day course of ciprofloxacin (he did not take last course prescribed)  - follow-up urine culture to check sensitivities    9) alcohol consumption - advised decreased alcohol consumption    I personally spent 45 minutes face-to-face and  non-face-to-face in the care of this patient, which includes all pre, intra, and post visit time on the date of service.  All documented time was specific to the E/M visit and does not include any procedures that may have been performed.     Laurette Schimke Hyacinth Meeker, MD, PhD  Assistant Professor of Medicine, Division of [facility]  [facility]      Reason for Visit  The patient is seen in consultation at the request of Mark Francois, FNP for evaluation of prostate cancer.    History of Present Illness:  Hematology/[facility] History Overview Note   03/19/21 - CT A/P with contrast performed to follow up on pancreatic mass, with near resolution of pancreatic uncinate process. Bulky multistation abdominopelvic lymphadenopathy identified (e.g. left para-aortic node measuring 2.1cm and right common iliac node measuring 1.4cm). No concerning osseous lesions identified. Enlarged, heterogeneously enhancing prostate gland.  04/17/21 - PSA 48.83  05/29/21 - retroperitoneal lymph node core needle biopsy, pathology consistent with metastatic prostate adenocarcinoma.  06/27/21 - PSMA-PET scan with widely metastatic osseous disease as well as avid adenopathy extending from the pelvis to the left supraclavicular region consistent with metastatic prostate cancer. Noting that there is one single enlarged retroperitoneal node with low/minimal tracer avidity measuring up to 4 cm.  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)     06/04/2021 -  Cancer Staged    Staging form: Prostate, AJCC 8th Edition  - Clinical: Stage IVB (cT1c, cN1, cM1a, PSA: 79) - Signed by Mark Pique, MD on 06/04/2021       08/26/2021 - 12/24/2021 Chemotherapy    OP PROSTATE DOCETAXEL/PREDNISONE  Docetaxel 75 mg/m2 evry 21 days     08/27/2021 Endocrine/Hormone Therapy    OP PROSTATE LEUPROLIDE (ELIGARD) 45 MG EVERY 6 MONTHS  Plan Provider: Davy Pique, MD     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)   06/04/2021 Initial Diagnosis    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)     07/30/2021 - 07/30/2021 Endocrine/Hormone Therapy    OP DEGARELIX  Plan Provider: Davy Pique, MD       Allergies:  No Known Allergies    Current Medications:    Current Outpatient Medications: amlodipine (NORVASC) 10 MG tablet, Take 1 tablet (10 mg total) by mouth daily., Disp: 90 tablet, Rfl: 0    BINAXNOW COVID-19 AG SELF TEST Kit, TEST AS DIRECTED TODAY, Disp: , Rfl:     buPROPion (WELLBUTRIN SR) 150 MG 12 hr tablet, Take 1 tablet (150 mg total) by mouth., Disp: , Rfl:     calcium carbonate-vitamin D3 600 mg-20 mcg (800 unit) Tab, Take 1 tablet by mouth two (2) times a day., Disp: 60 tablet, Rfl: 11    hydroCHLOROthiazide (HYDRODIURIL) 25 MG tablet, Take 1 tablet (25 mg total) by mouth daily., Disp: 90 tablet, Rfl: 0    metFORMIN (GLUCOPHAGE) 500 MG tablet, Take 2 tablets (1,000 mg total) by mouth in the morning and 2 tablets (1,000 mg total) in the evening. Take with meals., Disp: , Rfl:     pantoprazole (PROTONIX) 40 MG tablet, Take 1 tablet (40  mg total) by mouth., Disp: , Rfl:     potassium chloride 20 MEQ ER tablet, TAKE 2 TABLETS BY MOUTH DAILY FOR POTASSIUM, Disp: , Rfl:     pravastatin (PRAVACHOL) 20 MG tablet, Take 1 tablet (20 mg total) by mouth daily., Disp: 90 tablet, Rfl: 0    tamsulosin (FLOMAX) 0.4 mg capsule, Take 1 capsule (0.4 mg total) by mouth daily., Disp: 90 capsule, Rfl: 0    ciprofloxacin HCl (CIPRO) 500 MG tablet, Take 1 tablet (500 mg total) by mouth every twelve (12) hours., Disp: 14 tablet, Rfl: 0    senna-docusate (PERICOLACE) 8.6-50 mg, Take 2 tablets by mouth daily as needed for constipation., Disp: 30 tablet, Rfl: 1  No current facility-administered medications for this visit.    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)    Anemia of chronic disease    Gallstones and inflammation of gallbladder without obstruction    Enlarged prostate with urinary retention        Past Surgical History:   Procedure Laterality Date    CHG X-RAY FOR BILE DUCT ENDOSCOPY  12/08/2019    Procedure: ENDOSCOPIC CATHETERIZATION OF THE BILIARY DUCTAL SYSTEM, RADIOLOGICAL SUPERVISION AND INTERPRETATION;  Surgeon: Chriss Driver, MD;  [facility];  Service: Gastroenterology    PR ERCP,SPHINCTEROTOMY N/A 12/08/2019    Procedure: ERCP; W/SPHINCTEROTOMY/PAPILLOTOMY;  Surgeon: Chriss Driver, MD;  Location: [facility];  Service: Gastroenterology    PR UPGI ENDOSCOPY W/US FN BX N/A 12/08/2019    Procedure: UGI W/ TRANSENDOSCOPIC ULTRASOUND GUIDED INTRAMURAL/TRANSMURAL FINE NEEDLE ASPIRATION/BIOPSY(S), ESOPHAGUS;  Surgeon: Chriss Driver, MD;  [facility]: [facility];  Service: Gastroenterology        Social History     Occupational History    Occupation: disability   Tobacco Use    Smoking status: Every Day    Smokeless tobacco: Never    Tobacco comments:     1 pack per week. reports gradually.    Vaping Use    Vaping status: Never Used   Substance and Sexual Activity    Alcohol use: Yes     Alcohol/week: 21.0 standard drinks of alcohol     Types: 21 Cans of beer per week     Comment: 3x 40-once cans of beer per day    Drug use: No    Sexual activity: Not on file       Family History  Family History   Problem Relation Age of Onset    Cancer Father     Cancer Brother     Anesthesia problems Neg Hx      Prostate Cancer Family History Assessment:  History of cancer in children (yes/no; if yes, what type AND age of diagnosis): no children  Total number of siblings (including deceased): 4 brothers, 2 sisters  History of cancer in siblings (yes/no; if yes, provide relation, type of cancer, AND age of diagnosis): oldest brother had unknown type of cancer  History of cancer in parents (yes/no; if yes, please specify parent, type of cancer, AND age of  diagnosis): mother - unknown, father passed from unknown cancer  History of cancer in aunts/uncles/grandparents (yes/no; if yes, provide relation, type of cancer, AND age of diagnosis): none known      Review of Systems:  A comprehensive review of 10 systems was negative except for pertinent positives noted in HPI.    Physical Exam:  VITAL SIGNS:  BP 139/83  - Pulse 88  - Temp 35.8 ??C (96.4 ??F) (Temporal)  - Resp 16  - Ht 175.3 cm (5' 9)  - Wt 68.4 kg (150 lb 14.4 oz)  - SpO2 100%  - BMI 22.28 kg/m??   ECOG Performance Status: 0  GENERAL: Well-developed, well-nourished patient in no acute distress.  HEAD: Normocephalic and atraumatic.  EYES: Conjunctivae are normal. No scleral icterus.  MOUTH/THROAT: Oropharynx is clear and moist.  No mucosal lesions. Poor dentition, with tooth on the bottom right decaying.  NECK: Supple, no thyromegaly.  CARDIOVASCULAR: Normal rate, regular rhythm and normal heart sounds.  Exam reveals no gallop and no friction rub.  No murmur heard.  PULMONARY/CHEST: Effort normal and breath sounds normal. No respiratory distress. + pain with palpation under left breast  ABDOMINAL:  Soft. There is no distension. There is no tenderness. There is no rebound and no guarding.  MUSCULOSKELETAL: No clubbing, cyanosis, or lower extremity edema.  PSYCHIATRIC: Alert and oriented.  Normal mood and affect.  NEUROLOGIC: No focal motor deficit. Normal gait.  SKIN: Skin is warm, dry, and intact. No skin changes over chest.      Results/Orders:    Office Visit on 10/28/2022   Component Date Value Ref Range Status    Color, UA 10/28/2022 Dark Yellow   Final    Clarity, UA 10/28/2022 Turbid   Final    Specific Gravity, UA 10/28/2022 1.023  1.003 - 1.030 Final    pH, UA 10/28/2022 6.0  5.0 - 9.0 Final    Leukocyte Esterase, UA 10/28/2022 Large (A)  Negative Final    Nitrite, UA 10/28/2022 Positive (A)  Negative Final    Protein, UA 10/28/2022 100 mg/dL (A)  Negative Final    Glucose, UA 10/28/2022 Negative  Negative Final    Ketones, UA 10/28/2022 Negative  Negative Final    Urobilinogen, UA 10/28/2022 <2.0 mg/dL  <1.6 mg/dL Final    Bilirubin, UA 10/28/2022 Negative  Negative Final    Blood, UA 10/28/2022 Large (A)  Negative Final    RBC, UA 10/28/2022 >182 (H)  <=3 /HPF Final    WBC, UA 10/28/2022 >182 (H)  <=2 /HPF Final    WBC Clumps 10/28/2022 Many (A)  None Seen /HPF Final    Squam Epithel, UA 10/28/2022 <1  0 - 5 /HPF Final    Bacteria, UA 10/28/2022 Few (A)  None Seen /HPF Final    Mucus, UA 10/28/2022 Moderate (A)  None Seen /HPF Final   Lab on 10/28/2022   Component Date Value Ref Range Status    PSA 10/28/2022 18.92 (H)  0.00 - 4.00 ng/mL Final    Sodium 10/28/2022 130 (L)  135 - 145 mmol/L Final    Potassium 10/28/2022 3.3 (L)  3.5 - 5.1 mmol/L Final    Chloride 10/28/2022 94 (L)  98 - 107 mmol/L Final    CO2 10/28/2022 30.0  20.0 - 31.0 mmol/L Final    Anion Gap 10/28/2022 6  5 - 14 mmol/L Final    BUN 10/28/2022 12  9 - 23 mg/dL Final    Creatinine 10/96/0454 0.91  0.73 -  1.18 mg/dL Final    BUN/Creatinine Ratio 10/28/2022 13   Final    eGFR CKD-EPI (2021) Male 10/28/2022 >90  >=60 mL/min/1.35m2 Final    eGFR calculated with CKD-EPI 2021 equation in accordance with [employer] and [employer] of Nephrology Task Force recommendations.    Glucose 10/28/2022 191 (H)  70 - 179 mg/dL Final    Calcium 04/54/0981 10.6 (H)  8.7 - 10.4 mg/dL Final    Albumin 19/14/7829 3.4  3.4 - 5.0 g/dL Final    Total Protein 10/28/2022 8.8 (H)  5.7 - 8.2 g/dL Final    Total Bilirubin 10/28/2022 0.7  0.3 - 1.2 mg/dL Final    AST 56/21/3086 16  <=34 U/L Final    ALT 10/28/2022 7 (L)  10 - 49 U/L Final    Alkaline Phosphatase 10/28/2022 116  46 - 116 U/L Final       Lab Results   Component Value Date    PSA 18.92 (H) 10/28/2022    PSA 7.66 (H) 09/16/2022    PSA 8.32 (H) 08/21/2022    PSA 1.66 06/26/2022    PSA 5.63 (H) 04/25/2022    PSA 0.50 01/28/2022    PSA 1.35 12/24/2021    PSA 1.44 12/05/2021    PSA 2.67 11/12/2021    PSA 4.78 (H) 10/22/2021 Orders placed or performed in visit on 10/28/22    Urine Culture    Urinalysis with Microscopy    Comprehensive Metabolic Panel    PSA (Prostate Specific Antigen)    Testosterone    CBC w/ Differential    Clinic Appointment Request Physician    Clinic Appointment Request Physician    LAB Appointment Request

## 2022-11-01 ENCOUNTER — Emergency Department
Admission: EM | Admit: 2022-11-01 | Discharge: 2022-11-01 | Disposition: A | Discharge status: Home / Self Care | Payer: Medicaid Other | Attending: Emergency Medicine | Admitting: Emergency Medicine

## 2022-11-01 ENCOUNTER — Other Ambulatory Visit: Payer: Self-pay

## 2022-11-01 ENCOUNTER — Encounter: Payer: Self-pay | Admitting: Emergency Medicine

## 2022-11-01 DIAGNOSIS — Z8546 Personal history of malignant neoplasm of prostate: Secondary | ICD-10-CM | POA: Insufficient documentation

## 2022-11-01 DIAGNOSIS — R339 Retention of urine, unspecified: Secondary | ICD-10-CM

## 2022-11-01 DIAGNOSIS — N3090 Cystitis, unspecified without hematuria: Secondary | ICD-10-CM | POA: Insufficient documentation

## 2022-11-01 DIAGNOSIS — I10 Essential (primary) hypertension: Secondary | ICD-10-CM | POA: Diagnosis not present

## 2022-11-01 DIAGNOSIS — R300 Dysuria: Secondary | ICD-10-CM | POA: Diagnosis present

## 2022-11-01 LAB — URINALYSIS, ROUTINE W REFLEX MICROSCOPIC
Bacteria, UA: NONE SEEN
Bilirubin Urine: NEGATIVE
Glucose, UA: NEGATIVE mg/dL
Ketones, ur: NEGATIVE mg/dL
Nitrite: NEGATIVE
Protein, ur: NEGATIVE mg/dL
RBC / HPF: >50 RBC/hpf (ref 0–5)
Specific Gravity, Urine: 1.008 (ref 1.005–1.030)
Squamous Epithelial / HPF: NONE SEEN /HPF (ref 0–5)
pH: 5 (ref 5.0–8.0)

## 2022-11-01 MED ORDER — CEFPODOXIME PROXETIL 200 MG PO TABS: 200.0000 mg | ORAL_TABLET | Freq: Two times a day (BID) | ORAL | 0 refills | Status: AC

## 2022-11-01 NOTE — ED Provider Notes (Signed)
[facility] Provider Note    Event Date/Time   First MD Initiated Contact with Patient 11/01/22 [phone]     (approximate)   History   Chief Complaint Bladder Pain   HPI  Louis Gardner is a 62 y.o. male with past medical history of hypertension, chronic pancreatitis, and prostate cancer who presents to the ED complaining of bladder pain.  Patient was seen in the ED about 2 weeks ago for acute urinary retention and had Foley catheter placed at that time.  He was having some discomfort when he urinated 4 days ago, seen by his provider at [facility] at that time and started on ciprofloxacin.  He states that symptoms initially started to improve, but he has had increasing suprapubic discomfort and dysuria with some hematuria for the past 2 days.  He states that the flow from his catheter seem to be decreased as well.  He has not had any fevers, flank pain, nausea, or vomiting.  His catheter was replaced in triage with return of 700 cc of urine, now states he is feeling better.     Physical Exam   Triage Vital Signs: ED Triage Vitals [11/01/22 0545]  Enc Vitals Group     BP (!) 88/63     Pulse Rate 95     Resp 18     Temp 97.8 F (36.6 C)     Temp Source Oral     SpO2 100 %     Weight 153 lb (69.4 kg)     Height 5\' 9"  (1.753 m)     Head Circumference      Peak Flow      Pain Score 10     Pain Loc      Pain Edu?      Excl. in GC?     Most recent vital signs: Vitals:   11/01/22 0545 11/01/22 0724  BP: (!) 88/63 128/89  Pulse: 95 84  Resp: 18 16  Temp: 97.8 F (36.6 C)   SpO2: 100% 98%    Constitutional: Alert and oriented. Eyes: Conjunctivae are normal. Head: Atraumatic. Nose: No congestion/rhinnorhea. Mouth/Throat: Mucous membranes are moist.  Cardiovascular: Normal rate, regular rhythm. Grossly normal heart sounds.  2+ radial pulses bilaterally. Respiratory: Normal respiratory effort.  No retractions. Lungs CTAB. Gastrointestinal: Soft and  nontender.  No CVA tenderness bilaterally.  No distention. Musculoskeletal: No lower extremity tenderness nor edema.  Neurologic:  Normal speech and language. No gross focal neurologic deficits are appreciated.    ED Results / Procedures / Treatments   Labs (all labs ordered are listed, but only abnormal results are displayed) Labs Reviewed  URINALYSIS, ROUTINE W REFLEX MICROSCOPIC - Abnormal; Notable for the following components:      Result Value   Color, Urine YELLOW (*)    APPearance CLEAR (*)    Hgb urine dipstick LARGE (*)    Leukocytes,Ua TRACE (*)    All other components within normal limits  URINE CULTURE    PROCEDURES:  Critical Care performed: No  Procedures   MEDICATIONS ORDERED IN ED: Medications - No data to display   IMPRESSION / MDM / ASSESSMENT AND PLAN / ED COURSE  I reviewed the triage vital signs and the nursing notes.                              62 y.o. male with past medical history of hypertension, chronic pancreatitis, prostate  cancer who presents to the ED complaining of suprapubic discomfort and decreased flow from his Foley catheter for the past 2 days.  Patient's presentation is most consistent with acute complicated illness / injury requiring diagnostic workup.  Differential diagnosis includes, but is not limited to, cystitis, pyelonephritis, obstructed Foley catheter.  Patient nontoxic-appearing and in no acute distress, vital signs initially remarkable for borderline hypotension however BP improved on recheck without intervention.  His Foley catheter was replaced and flow now appears improved, patient reports feeling better.  Symptoms do sound concerning for recurrent cystitis, urinalysis also concerning for this.  We will send urine for culture and change his antibiotic from Cipro to cefpodoxime.  No signs of pyelonephritis or sepsis at this time.  Patient appropriate for outpatient management, was counseled to follow-up with his urologist  at [facility] and to return to the ED for new or worsening symptoms.  Patient agrees with plan.      FINAL CLINICAL IMPRESSION(S) / ED DIAGNOSES   Final diagnoses:  Cystitis  Urinary retention     Rx / DC Orders   ED Discharge Orders          Ordered    cefpodoxime (VANTIN) 200 MG tablet  2 times daily        11/01/22 1610             Note:  This document was prepared using Dragon voice recognition software and may include unintentional dictation errors.   Chesley Noon, MD 11/01/22 [phone]

## 2022-11-01 NOTE — ED Notes (Signed)
Patient Alert and oriented to baseline. Stable and ambulatory to baseline. Patient verbalized understanding of the discharge instructions.  Patient belongings were taken by the patient.   

## 2022-11-01 NOTE — ED Triage Notes (Addendum)
Patient c/o bladder pain x 2 days.  Patient has foley in place, urine in bag. Patient recently diagnosed with a UTI, taking abx appropriately.

## 2022-11-02 LAB — URINE CULTURE: Culture: NO GROWTH

## 2022-11-04 NOTE — Unmapped (Signed)
TC to Mark [facility] to follow up after his appointment last week. I confirmed he stopped his hydrochlorothiazide as instructed due to hypok, hypoNa and hypercalcemia. He also has been taking his potassium 40 mEq daily. He reports that his BP has been OK since last week but can't report the numbers. He did go to [facility] on 11/01/22 says he needed to get a bag placed I clarified if it was a catheter and he said yes. Will have records requested to be sent.    Laverna Peace PharmD, BCOP, CPP  Hematology/Oncology Pharmacist  P: [phone]

## 2022-11-05 ENCOUNTER — Ambulatory Visit: Admit: 2022-11-05 | Discharge: 2022-11-06 | Payer: PRIVATE HEALTH INSURANCE

## 2022-11-06 ENCOUNTER — Ambulatory Visit: Payer: Medicaid Other

## 2022-11-08 ENCOUNTER — Ambulatory Visit: Payer: Medicaid Other | Admitting: Physician Assistant

## 2022-11-12 ENCOUNTER — Ambulatory Visit: Admit: 2022-11-12 | Discharge: 2022-11-13 | Payer: PRIVATE HEALTH INSURANCE

## 2022-11-13 ENCOUNTER — Encounter: Payer: Self-pay | Admitting: Physician Assistant

## 2022-11-13 ENCOUNTER — Ambulatory Visit: Payer: Medicaid Other | Admitting: Physician Assistant

## 2022-11-13 VITALS — BP 115/69 | HR 88 | Wt 153.0 lb

## 2022-11-13 DIAGNOSIS — R339 Retention of urine, unspecified: Secondary | ICD-10-CM

## 2022-11-13 LAB — URINALYSIS, COMPLETE
Bilirubin, UA: NEGATIVE
Glucose, UA: NEGATIVE
Ketones, UA: NEGATIVE
Nitrite, UA: NEGATIVE
Specific Gravity, UA: 1.01 (ref 1.005–1.030)
Urobilinogen, Ur: 0.2 mg/dL (ref 0.2–1.0)
pH, UA: 6 (ref 5.0–7.5)

## 2022-11-13 LAB — BLADDER SCAN AMB NON-IMAGING: Scan Result: 423 mL

## 2022-11-13 LAB — MICROSCOPIC EXAMINATION: RBC, Urine: >30 /hpf — AB (ref 0–2)

## 2022-11-13 MED ORDER — SULFAMETHOXAZOLE-TRIMETHOPRIM 800-160 MG PO TABS
1.0000 | ORAL_TABLET | Freq: Once | ORAL | Status: AC
Start: 2022-11-13 — End: 2022-11-13
  Administered 2022-11-13: 1 via ORAL

## 2022-11-13 NOTE — Progress Notes (Signed)
11/13/2022 4:05 PM   Carmie End 1960-11-13 [id number]  CC: Chief Complaint  Patient presents with   void and trial   HPI: Louis Gardner is a 62 y.o. male with metastatic prostate cancer managed by medical oncology at [facility] with recurrent urinary retention who presents today for voiding trial after Foley catheter was placed in the ED on 10/22/2022.   He was most recently seen at [facility] on 10/28/2022 with imaging findings of progressive disease and now mCRPC.  Darolutamide has been stopped and he anticipates starting Pluvicto and cabazitaxel the near future.  With regard to his urinary retention, he is scheduled for urodynamics in [place] later this month.  Foley catheter removed in the morning, see separate procedure note for details.  He returned to clinic in the afternoon.  He has been unable to void and is uncomfortable.  Bladder scan on arrival 4 to 23 mL.  PMH: Past Medical History:  Diagnosis Date   Arthritis    Depression    GI bleeding    Hypertension    Pancreatic cancer (HCC)    Sleep apnea     Surgical History: Past Surgical History:  Procedure Laterality Date   COLONOSCOPY     ESOPHAGOGASTRODUODENOSCOPY (EGD) WITH PROPOFOL N/A 03/19/2016   Procedure: ESOPHAGOGASTRODUODENOSCOPY (EGD) WITH PROPOFOL;  Surgeon: Christena Deem, MD;  Location: [facility] ENDOSCOPY;  Service: Endoscopy;  Laterality: N/A;    Home Medications:  Allergies as of 11/13/2022   No Known Allergies      Medication List        Accurate as of November 13, 2022  4:05 PM. If you have any questions, ask your nurse or doctor.          amLODipine 10 MG tablet Commonly known as: NORVASC Take 10 mg by mouth daily.   buPROPion 150 MG 12 hr tablet Commonly known as: WELLBUTRIN SR Take 150 mg by mouth 2 (two) times daily.   Calcium Carb-Cholecalciferol 600-20 MG-MCG Tabs Take by mouth.   ciprofloxacin 500 MG tablet Commonly known as: CIPRO Take by mouth.   hydrochlorothiazide 12.5  MG tablet Commonly known as: HYDRODIURIL Take 12.5 mg by mouth daily.   metFORMIN 500 MG tablet Commonly known as: GLUCOPHAGE Take by mouth.   Na Sulfate-K Sulfate-Mg Sulf 17.5-3.13-1.6 GM/177ML Soln See admin instructions. follow package directions   Nubeqa 300 MG tablet Generic drug: darolutamide Take by mouth.   pantoprazole 40 MG tablet Commonly known as: PROTONIX Take 40 mg by mouth daily.   potassium chloride SA 20 MEQ tablet Commonly known as: KLOR-CON M TAKE 2 TABLETS BY MOUTH DAILY FOR POTASSIUM   pravastatin 20 MG tablet Commonly known as: PRAVACHOL   senna-docusate 8.6-50 MG tablet Commonly known as: Senokot-S Take by mouth.   tamsulosin 0.4 MG Caps capsule Commonly known as: FLOMAX Take 1 capsule by mouth daily.        Allergies:  No Known Allergies  Family History: No family history on file.  Social History:   reports that he has been smoking cigarettes. He has a 12.00 pack-year smoking history. He has been exposed to tobacco smoke. He has never used smokeless tobacco. He reports current alcohol use of about 6.0 standard drinks of alcohol per week. He reports that he does not use drugs.  Physical Exam: BP 115/69   Pulse 88   Wt 153 lb (69.4 kg)   BMI 22.59 kg/m   Constitutional:  Alert and oriented, no acute distress, nontoxic appearing HEENT: [place],  AT Cardiovascular: No clubbing, cyanosis, or edema Respiratory: Normal respiratory effort, no increased work of breathing Skin: No rashes, bruises or suspicious lesions Neurologic: Grossly intact, no focal deficits, moving all 4 extremities Psychiatric: Normal mood and affect  Laboratory Data: Results for orders placed or performed in visit on 11/13/22  Microscopic Examination   Urine  Result Value Ref Range   WBC, UA 11-30 (A) 0 - 5 /hpf   RBC, Urine >30 (A) 0 - 2 /hpf   Epithelial Cells (non renal) 0-10 0 - 10 /hpf   Casts Present (A) None seen /lpf   Cast Type Granular casts (A) N/A    Crystals Present (A) N/A   Crystal Type Amorphous Sediment N/A   Bacteria, UA Many (A) None seen/Few  Urinalysis, Complete  Result Value Ref Range   Specific Gravity, UA 1.010 1.005 - 1.030   pH, UA 6.0 5.0 - 7.5   Color, UA Yellow Yellow   Appearance Ur Hazy (A) Clear   Leukocytes,UA Trace (A) Negative   Protein,UA 1+ (A) Negative/Trace   Glucose, UA Negative Negative   Ketones, UA Negative Negative   RBC, UA 3+ (A) Negative   Bilirubin, UA Negative Negative   Urobilinogen, Ur 0.2 0.2 - 1.0 mg/dL   Nitrite, UA Negative Negative   Microscopic Examination See below:   BLADDER SCAN AMB NON-IMAGING  Result Value Ref Range   Scan Result 423 mL   Simple Catheter Placement  Due to urinary retention patient is present today for a foley cath placement.  Patient was cleaned and prepped in a sterile fashion with betadine and 2% lidocaine jelly was instilled into the urethra. A 16 FR coude foley catheter was inserted, urine return was noted  , urine was yellow in color.  The balloon was filled with 10cc of sterile water.  A leg bag was attached for drainage.  Patient tolerated well, no complications were noted   Performed by: Carman Ching, PA-C   Assessment & Plan:   1. Urinary retention Voiding trial failed.  I offered him Foley replacement versus CIC teaching and he elected for the former, see procedure note above.  Will plan to keep Foley in place and keep plans for urodynamics later this month.  Anticipate Foley exchange at that time.  Will see him back in about 6 weeks with UDS results and to discuss possible voiding trial versus CIC teaching versus Foley exchange.  Urine sample obtained from Foley today.  I gave him a dose of Bactrim for UTI prevention.  Will send for culture. - Urinalysis, Complete - CULTURE, URINE COMPREHENSIVE - sulfamethoxazole-trimethoprim (BACTRIM DS) 800-160 MG per tablet 1 tablet - BLADDER SCAN AMB NON-IMAGING   Return in about 6 weeks (around  12/25/2022) for UDS follow up, possible Foley removal vs voiding trial planning.  Carman Ching, PA-C  [facility] [address] [place], [place] [address] [phone]

## 2022-11-13 NOTE — Progress Notes (Signed)
Catheter Removal  Patient is present today for a catheter removal.  8 ml of water was drained from the balloon. A 16 fFR foley cath was removed with leg bag from the bladder, no complications were noted. Patient tolerated well.  Performed by: [place] Pink RMA  Follow up/ Additional notes: F/UP with 3pm schedule

## 2022-11-14 NOTE — Unmapped (Signed)
I spoke with Mark Stephenson on the phone. 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 I advised him that it could take a few months to get started with treatment if he wants to pursue Pluvicto, for example, so deciding sooner rather than later would be beneficial. I will be seeing him on July 22nd, and we will readdress his decision at that time.    Laurette Schimke Hyacinth Meeker, MD, PhD  Assistant Professor of Medicine, Division of Oncology  [facility]  Genitourinary Oncology Clinic

## 2022-11-18 LAB — CULTURE, URINE COMPREHENSIVE

## 2022-11-18 NOTE — Unmapped (Signed)
Specialty Medication(s): Nubeqa (darolutamide)    Mr.Enochs has been dis-enrolled from the [facility] specialty pharmacy services due to  prescription discontinued and medication stopped .    Additional information provided to the patient: NA    Kermit Balo, [facility]  [facility] Specialty Pharmacist

## 2022-11-23 ENCOUNTER — Encounter: Payer: Self-pay | Admitting: Emergency Medicine

## 2022-11-23 ENCOUNTER — Other Ambulatory Visit: Payer: Self-pay

## 2022-11-23 ENCOUNTER — Emergency Department: Payer: Medicaid Other

## 2022-11-23 ENCOUNTER — Emergency Department
Admission: EM | Admit: 2022-11-23 | Discharge: 2022-11-23 | Disposition: A | Discharge status: Home / Self Care | Payer: Medicaid Other | Attending: Emergency Medicine | Admitting: Emergency Medicine

## 2022-11-23 DIAGNOSIS — R0789 Other chest pain: Secondary | ICD-10-CM | POA: Diagnosis not present

## 2022-11-23 DIAGNOSIS — R079 Chest pain, unspecified: Secondary | ICD-10-CM | POA: Diagnosis present

## 2022-11-23 DIAGNOSIS — Z8546 Personal history of malignant neoplasm of prostate: Secondary | ICD-10-CM | POA: Diagnosis not present

## 2022-11-23 LAB — CBC
HCT: 33.5 % — ABNORMAL LOW (ref 39.0–52.0)
Hemoglobin: 11.1 g/dL — ABNORMAL LOW (ref 13.0–17.0)
MCH: 25.1 pg — ABNORMAL LOW (ref 26.0–34.0)
MCHC: 33.1 g/dL (ref 30.0–36.0)
MCV: 75.6 fL — ABNORMAL LOW (ref 80.0–100.0)
Platelets: 415 10*3/uL — ABNORMAL HIGH (ref 150–400)
RBC: 4.43 MIL/uL (ref 4.22–5.81)
RDW: 16.5 % — ABNORMAL HIGH (ref 11.5–15.5)
WBC: 9.3 10*3/uL (ref 4.0–10.5)
nRBC: 0 % (ref 0.0–0.2)

## 2022-11-23 LAB — BASIC METABOLIC PANEL
Anion gap: 13 (ref 5–15)
BUN: 9 mg/dL (ref 8–23)
CO2: 24 mmol/L (ref 22–32)
Calcium: 9.3 mg/dL (ref 8.9–10.3)
Chloride: 92 mmol/L — ABNORMAL LOW (ref 98–111)
Creatinine, Ser: 0.79 mg/dL (ref 0.61–1.24)
GFR, Estimated: >60 mL/min (ref >60)
Glucose, Bld: 158 mg/dL — ABNORMAL HIGH (ref 70–99)
Potassium: 3.5 mmol/L (ref 3.5–5.1)
Sodium: 129 mmol/L — ABNORMAL LOW (ref 135–145)

## 2022-11-23 LAB — TROPONIN I (HIGH SENSITIVITY): Troponin I (High Sensitivity): 7 ng/L (ref <18)

## 2022-11-23 MED ORDER — KETOROLAC TROMETHAMINE 30 MG/ML IJ SOLN
30.0000 mg | Freq: Once | INTRAMUSCULAR | Status: AC
  Administered 2022-11-23: 30 mg via INTRAVENOUS
  Filled 2022-11-23: qty 1

## 2022-11-23 MED ORDER — OXYCODONE-ACETAMINOPHEN 5-325 MG PO TABS: 1.0000 | ORAL_TABLET | Freq: Four times a day (QID) | ORAL | 0 refills | Status: DC | PRN

## 2022-11-23 MED ORDER — OXYCODONE-ACETAMINOPHEN 5-325 MG PO TABS
1.0000 | ORAL_TABLET | Freq: Once | ORAL | Status: AC
  Administered 2022-11-23: 1 via ORAL
  Filled 2022-11-23: qty 1

## 2022-11-23 MED ORDER — IOHEXOL 350 MG/ML SOLN
75.0000 mL | Freq: Once | INTRAVENOUS | Status: AC | PRN
  Administered 2022-11-23: 75 mL via INTRAVENOUS

## 2022-11-23 NOTE — Discharge Instructions (Addendum)
As we discussed, your CT scan had findings concerning for possible cancer.  Our oncology team will contact you on Monday to arrange for close follow-up.  In the meantime we have sent a stronger pain medication to your pharmacy to take as needed.  I would recommend taking a laxative or increasing your fiber as this pain medication can cause constipation

## 2022-11-23 NOTE — ED Provider Notes (Signed)
[facility] Provider Note    Event Date/Time   First MD Initiated Contact with Patient 11/23/22 1422     (approximate)   History   Chest Pain   HPI  Louis Gardner is a 62 y.o. male with a history of treated prostate cancer who presents with complaints of left-sided chest pain.  Patient reports this developed over the last couple of days.  He describes it as an achy pain worse with deep inspiration.  No history of blood clots.  No fevers chills or cough reported     Physical Exam   Triage Vital Signs: ED Triage Vitals  Encounter Vitals Group     BP 11/23/22 1304 126/79     Systolic BP Percentile --      Diastolic BP Percentile --      Pulse Rate 11/23/22 1304 87     Resp 11/23/22 1304 18     Temp 11/23/22 1304 98.5 F (36.9 C)     Temp Source 11/23/22 1304 Oral     SpO2 11/23/22 1304 100 %     Weight 11/23/22 1309 69.4 kg (153 lb)     Height 11/23/22 1309 1.753 m (5\' 9" )     Head Circumference --      Peak Flow --      Pain Score 11/23/22 1308 8     Pain Loc --      Pain Education --      Exclude from Growth Chart --     Most recent vital signs: Vitals:   11/23/22 1304 11/23/22 1655  BP: 126/79 127/83  Pulse: 87 82  Resp: 18 16  Temp: 98.5 F (36.9 C)   SpO2: 100% 100%     General: Awake, no distress.  CV:  Good peripheral perfusion.  Resp:  Normal effort.  Clear to auscultation Abd:  No distention.  Other:  No lower extremity swelling   ED Results / Procedures / Treatments   Labs (all labs ordered are listed, but only abnormal results are displayed) Labs Reviewed  BASIC METABOLIC PANEL - Abnormal; Notable for the following components:      Result Value   Sodium 129 (*)    Chloride 92 (*)    Glucose, Bld 158 (*)    All other components within normal limits  CBC - Abnormal; Notable for the following components:   Hemoglobin 11.1 (*)    HCT 33.5 (*)    MCV 75.6 (*)    MCH 25.1 (*)    RDW 16.5 (*)    Platelets 415  (*)    All other components within normal limits  PSA  TROPONIN I (HIGH SENSITIVITY)     EKG  ED ECG REPORT I, Jene Every, the attending physician, personally viewed and interpreted this ECG.  Date: 11/23/2022  Rhythm: normal sinus rhythm QRS Axis: normal Intervals: normal ST/T Wave abnormalities: normal Narrative Interpretation: no evidence of acute ischemia    RADIOLOGY CT scan viewed interpret by me, no evidence of PE    PROCEDURES:  Critical Care performed:   Procedures   MEDICATIONS ORDERED IN ED: Medications  ketorolac (TORADOL) 30 MG/ML injection 30 mg (30 mg Intravenous Given 11/23/22 1508)  iohexol (OMNIPAQUE) 350 MG/ML injection 75 mL (75 mLs Intravenous Contrast Given 11/23/22 1532)  oxyCODONE-acetaminophen (PERCOCET/ROXICET) 5-325 MG per tablet 1 tablet (1 tablet Oral Given 11/23/22 1644)     IMPRESSION / MDM / ASSESSMENT AND PLAN / ED COURSE  I reviewed the  triage vital signs and the nursing notes. Patient's presentation is most consistent with acute presentation with potential threat to life or bodily function.  Patient presents with chest pain as detailed above, left upper chest pain with pleurisy.  Differential includes PE, musculoskeletal pain, ACS, pneumonia  EKG, high sensitive troponin are generally reassuring, x-ray without evidence of pneumonia.  Will send for CT angiography  The angiography demonstrates multiple bony lesions concerning for metastatic disease.  Discussed with Dr. Cathie Hoops of oncology who recommends pain control, close outpatient follow-up with her  Discussed this with patient and he agrees with this plan        FINAL CLINICAL IMPRESSION(S) / ED DIAGNOSES   Final diagnoses:  Chest wall pain     Rx / DC Orders   ED Discharge Orders          Ordered    oxyCODONE-acetaminophen (PERCOCET) 5-325 MG tablet  Every 6 hours PRN        11/23/22 1635             Note:  This document was prepared using Dragon voice  recognition software and may include unintentional dictation errors.   Jene Every, MD 11/23/22 [phone]

## 2022-11-23 NOTE — ED Notes (Signed)
FIRST NURSE: Pt to ER via EMS from home with c/o intermittent left sided chest pain for last several days.  Pt states pain is sharp in nature, denies associated s/s.  Pt give 324 ASA by EMS.  CBG 146, VSS, 12 Lead WNL.

## 2022-11-23 NOTE — ED Triage Notes (Addendum)
Pt in via [facility] from home, reports intermittent left side chest pain x 2 days, denies any accompanying symptoms.  Patient appears uncomfortable in triage, vitals WDL.

## 2022-11-24 LAB — PSA: Prostatic Specific Antigen: 117.5 ng/mL — ABNORMAL HIGH (ref 0.00–4.00)

## 2022-11-26 ENCOUNTER — Inpatient Hospital Stay: Payer: Medicaid Other | Admitting: Oncology

## 2022-11-26 ENCOUNTER — Inpatient Hospital Stay: Payer: Medicaid Other

## 2022-11-27 ENCOUNTER — Inpatient Hospital Stay: Payer: Medicaid Other

## 2022-11-27 ENCOUNTER — Encounter: Payer: Self-pay | Admitting: Oncology

## 2022-11-27 ENCOUNTER — Inpatient Hospital Stay: Payer: Medicaid Other | Attending: Oncology | Admitting: Oncology

## 2022-11-27 VITALS — BP 135/76 | HR 98 | Temp 96.0°F | Resp 18 | Wt 147.4 lb

## 2022-11-27 DIAGNOSIS — R918 Other nonspecific abnormal finding of lung field: Secondary | ICD-10-CM | POA: Diagnosis not present

## 2022-11-27 DIAGNOSIS — F17210 Nicotine dependence, cigarettes, uncomplicated: Secondary | ICD-10-CM | POA: Diagnosis not present

## 2022-11-27 DIAGNOSIS — I10 Essential (primary) hypertension: Secondary | ICD-10-CM | POA: Insufficient documentation

## 2022-11-27 DIAGNOSIS — C61 Malignant neoplasm of prostate: Secondary | ICD-10-CM | POA: Insufficient documentation

## 2022-11-27 DIAGNOSIS — Z7189 Other specified counseling: Secondary | ICD-10-CM

## 2022-11-27 DIAGNOSIS — K922 Gastrointestinal hemorrhage, unspecified: Secondary | ICD-10-CM | POA: Insufficient documentation

## 2022-11-27 DIAGNOSIS — Z79899 Other long term (current) drug therapy: Secondary | ICD-10-CM | POA: Diagnosis not present

## 2022-11-27 DIAGNOSIS — C7951 Secondary malignant neoplasm of bone: Secondary | ICD-10-CM | POA: Diagnosis not present

## 2022-11-27 DIAGNOSIS — R591 Generalized enlarged lymph nodes: Secondary | ICD-10-CM | POA: Insufficient documentation

## 2022-11-27 DIAGNOSIS — I700 Atherosclerosis of aorta: Secondary | ICD-10-CM | POA: Insufficient documentation

## 2022-11-27 DIAGNOSIS — N320 Bladder-neck obstruction: Secondary | ICD-10-CM | POA: Insufficient documentation

## 2022-11-27 DIAGNOSIS — C779 Secondary and unspecified malignant neoplasm of lymph node, unspecified: Secondary | ICD-10-CM | POA: Insufficient documentation

## 2022-11-27 DIAGNOSIS — G4730 Sleep apnea, unspecified: Secondary | ICD-10-CM | POA: Diagnosis not present

## 2022-11-27 DIAGNOSIS — Z1211 Encounter for screening for malignant neoplasm of colon: Secondary | ICD-10-CM | POA: Insufficient documentation

## 2022-11-27 DIAGNOSIS — K8020 Calculus of gallbladder without cholecystitis without obstruction: Secondary | ICD-10-CM | POA: Diagnosis not present

## 2022-11-27 LAB — CMP (CANCER CENTER ONLY)
ALT: 11 U/L (ref 0–44)
AST: 18 U/L (ref 15–41)
Albumin: 3.8 g/dL (ref 3.5–5.0)
Alkaline Phosphatase: 96 U/L (ref 38–126)
Anion gap: 12 (ref 5–15)
BUN: 12 mg/dL (ref 8–23)
CO2: 27 mmol/L (ref 22–32)
Calcium: 9.4 mg/dL (ref 8.9–10.3)
Chloride: 89 mmol/L — ABNORMAL LOW (ref 98–111)
Creatinine: 1.03 mg/dL (ref 0.61–1.24)
GFR, Estimated: >60 mL/min (ref >60)
Glucose, Bld: 180 mg/dL — ABNORMAL HIGH (ref 70–99)
Potassium: 3.4 mmol/L — ABNORMAL LOW (ref 3.5–5.1)
Sodium: 128 mmol/L — ABNORMAL LOW (ref 135–145)
Total Bilirubin: 0.8 mg/dL (ref 0.3–1.2)
Total Protein: 9.1 g/dL — ABNORMAL HIGH (ref 6.5–8.1)

## 2022-11-27 LAB — CBC WITH DIFFERENTIAL (CANCER CENTER ONLY)
Abs Immature Granulocytes: 0.05 10*3/uL (ref 0.00–0.07)
Basophils Absolute: 0 10*3/uL (ref 0.0–0.1)
Basophils Relative: 0 %
Eosinophils Absolute: 0.1 10*3/uL (ref 0.0–0.5)
Eosinophils Relative: 1 %
HCT: 34.6 % — ABNORMAL LOW (ref 39.0–52.0)
Hemoglobin: 11.1 g/dL — ABNORMAL LOW (ref 13.0–17.0)
Immature Granulocytes: 1 %
Lymphocytes Relative: 18 %
Lymphs Abs: 1.4 10*3/uL (ref 0.7–4.0)
MCH: 24.7 pg — ABNORMAL LOW (ref 26.0–34.0)
MCHC: 32.1 g/dL (ref 30.0–36.0)
MCV: 76.9 fL — ABNORMAL LOW (ref 80.0–100.0)
Monocytes Absolute: 0.9 10*3/uL (ref 0.1–1.0)
Monocytes Relative: 12 %
Neutro Abs: 5.3 10*3/uL (ref 1.7–7.7)
Neutrophils Relative %: 68 %
Platelet Count: 511 10*3/uL — ABNORMAL HIGH (ref 150–400)
RBC: 4.5 MIL/uL (ref 4.22–5.81)
RDW: 16.3 % — ABNORMAL HIGH (ref 11.5–15.5)
WBC Count: 7.7 10*3/uL (ref 4.0–10.5)
nRBC: 0 % (ref 0.0–0.2)

## 2022-11-27 LAB — RETIC PANEL
Immature Retic Fract: 16 % — ABNORMAL HIGH (ref 2.3–15.9)
RBC.: 4.51 MIL/uL (ref 4.22–5.81)
Retic Count, Absolute: 66.3 10*3/uL (ref 19.0–186.0)
Retic Ct Pct: 1.5 % (ref 0.4–3.1)
Reticulocyte Hemoglobin: 25.1 pg — ABNORMAL LOW (ref >27.9)

## 2022-11-27 LAB — IRON AND TIBC
Iron: 31 ug/dL — ABNORMAL LOW (ref 45–182)
Saturation Ratios: 12 % — ABNORMAL LOW (ref 17.9–39.5)
TIBC: 256 ug/dL (ref 250–450)
UIBC: 225 ug/dL

## 2022-11-27 LAB — FERRITIN: Ferritin: 708 ng/mL — ABNORMAL HIGH (ref 24–336)

## 2022-11-27 NOTE — Assessment & Plan Note (Signed)
Previous oncology history, treatment records were reviewed. He has history of metastatic prostate cancer with lymph node and bone involvement.  Currently on androgen deprivation therapy with the last dose of Eligard on 10/28/2022 Status post first-line treatment with docetaxel every 3 weeks x 4 cycles, as well as Daralutamide.  He has now developed castration resistant disease. We had a lengthy discussion about neck step options, Pluvicto versus salvage chemotherapy with cabazitaxel. Patient is interested in evaluation for Pluvicto, I will refer to Dr. Amil Amen

## 2022-11-27 NOTE — Assessment & Plan Note (Signed)
Status post Foley catheter placement. Recommend patient to follow-up with urology.

## 2022-11-27 NOTE — Assessment & Plan Note (Signed)
Palliative intent.  Patient understands that her condition is not curable.

## 2022-11-27 NOTE — Progress Notes (Signed)
Hematology/Oncology Consult Note [phone] [phone]     REFERRING PROVIDER: Jene Every, MD    CHIEF COMPLAINTS/PURPOSE OF CONSULTATION:  metastatic prostate cancer  ASSESSMENT & PLAN:   Prostate cancer metastatic to bone [facility]) Previous oncology history, treatment records were reviewed. He has history of metastatic prostate cancer with lymph node and bone involvement.  Currently on androgen deprivation therapy with the last dose of Eligard on 10/28/2022 Status post first-line treatment with docetaxel every 3 weeks x 4 cycles, as well as Daralutamide.  He has now developed castration resistant disease. We had a lengthy discussion about neck step options, Pluvicto versus salvage chemotherapy with cabazitaxel. Patient is interested in evaluation for Pluvicto, I will refer to Dr. Amil Amen   Goals of care, counseling/discussion Palliative intent.  Patient understands that her condition is not curable.  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 Recommend patient to follow-up with urology.   Orders Placed This Encounter  Procedures   NM Radiologist Eval And Mgmt    Referral for pt to see Dr. Amil Amen    Standing Status:   Future    Standing Expiration Date:   11/27/2023    Order Specific Question:   Reason for Exam (SYMPTOM  OR DIAGNOSIS REQUIRED)    Answer:   eval for pluvicto administration; prostate cancer    Order Specific Question:   If indicated for the ordered procedure, I authorize the administration of a radiopharmaceutical per Radiology protocol    Answer:   Yes    Order Specific Question:   Preferred imaging location?    Answer:   [facility]   NM PLUVICTO ADMINISTRATION    Standing Status:   Future    Standing Expiration Date:   11/27/2023    Order Specific Question:   Reason for Exam (SYMPTOM  OR DIAGNOSIS REQUIRED)    Answer:   pluvicto administraton    Order Specific Question:   If indicated for the  ordered procedure, I authorize the administration of a radiopharmaceutical per Radiology protocol    Answer:   Yes    Order Specific Question:   Preferred imaging location?    Answer:   [facility]   CMP (Cancer Center only)    Standing Status:   Future    Number of Occurrences:   1    Standing Expiration Date:   11/27/2023   CBC with Differential (Cancer Center Only)    Standing Status:   Future    Number of Occurrences:   1    Standing Expiration Date:   11/27/2023   Ferritin    Standing Status:   Future    Number of Occurrences:   1    Standing Expiration Date:   11/27/2023   Retic Panel    Standing Status:   Future    Number of Occurrences:   1    Standing Expiration Date:   11/27/2023   Follow-up to be determined. All questions were answered. The patient knows to call the clinic with any problems, questions or concerns.  Louis Patience, MD, PhD [facility] Hematology Oncology 11/27/2022    HISTORY OF PRESENTING ILLNESS:  Louis Gardner 62 y.o. male presents to establish care for metastatic prostate cancer I have reviewed his chart and materials related to his cancer extensively and collaborated history with the patient. Summary of oncologic history is as follows: Oncology History  Prostate cancer metastatic to bone [facility])  05/29/2021 Initial Diagnosis   Prostate cancer metastatic to  bone (HCC)  03/19/21 - CT A/P with contrast performed to follow up on pancreatic mass, with near resolution of pancreatic uncinate process. Bulky multistation abdominopelvic lymphadenopathy identified (e.g. left para-aortic node measuring 2.1cm and right common iliac node measuring 1.4cm). No concerning osseous lesions identified. Enlarged, heterogeneously enhancing prostate gland. 04/17/21 - PSA 48.83 05/29/21 - retroperitoneal lymph node core needle biopsy, pathology consistent with metastatic prostate adenocarcinoma. 06/27/21 - PSMA-PET scan with "widely metastatic osseous disease as well as  avid adenopathy extending from the pelvis to the left supraclavicular region consistent with metastatic prostate cancer. Noting that there is one single enlarged retroperitoneal node with low/minimal tracer avidity measuring up to 4 cm."  07/31/2022 PSA 406.39 08/27/21- 12/24/2021, 4 cycles. -docetaxel chemotherapy 75mg /m2 mid April 2023- darolutamide per ARASENS trial in mid April 2023  01/14/2022 PSMA showed marked interval improvement, tracer avid osseous disease now with only 6 lesions that demonstrating mild residual focal avidity  01/29/2023 PSA nadir  0.5 08/21/2022 PSA 3.32 09/25/2022, he developed castration resistant disease -Diffuse sclerotic lesions throughout the skeleton some of which demonstrate Pylarify uptake, as described in the body of the report, concerning for Pylarify avid metastatic disease. Uptake of the Pylarify avid lesions is overall similar to that of the liver.  -New uptake in the right posterior prostate and right seminal vesicle, concerning for malignancy.   Patient was recommended to stop darolutamide.  He was offered Pluvicto versus salvage chemotherapy with cabazitaxel.  He was undecided. 10/28/2022 PSA 18.92.         11/23/2022 Tumor Marker   PSA 117.5   11/23/2022 Imaging   Patient presented emergency room for evaluation of chest pain for 2 days. CT chest angiogram PE protocol 1. No evidence for acute pulmonary embolus. 2. Diffusely abnormal bones with multifocal areas of abnormal increased sclerosis noted throughout the thoracic spine, sternum and ribs concerning for diffuse osseous metastasis. 3. Prominent mediastinal and hilar lymph nodes are identified. These are nonspecific and may be reactive. Nodal metastasis not excluded. 4. Focal area of bronchiectasis with adjacent peripheral tree-in-bud nodularity in the right upper lobe. This is favored to represent sequelae of prior infection or inflammation. 5. Bilateral adrenal gland hyperplasia. 6.  Cholelithiasis. 7. Irregular subpleural nodule in the anterior left upper lobe measures 4 mm. 8.  Aortic Atherosclerosis   11/27/2022 Cancer Staging   Staging form: Prostate, AJCC 8th Edition - Pathologic: Stage IVB (pT2, pN1, cM1, PSA: 54) - Signed by Louis Patience, MD on 11/27/2022 Stage prefix: Initial diagnosis Prostate specific antigen (PSA) range: 20 or greater   Patient was referred to establish care with oncology locally.  He will like to transfer his oncology care to our cancer center.  Today patient was accompanied by his partner.  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  Altered and off left foot.  # Bladder outlet obstruction-patient has indwelling Foley catheter due to obstruction. No additional chest pain episodes.  He took a dose of oxycodone that was prescribed by ER. EKG, troponin were reassuring.  X-ray showed no pneumonia. Patient denies any shortness of breath, back pain.   MEDICAL HISTORY:  Past Medical History:  Diagnosis Date   Arthritis    Depression    GI bleeding    Hypertension    Pancreatic cancer (HCC)    Sleep apnea     SURGICAL HISTORY: Past Surgical History:  Procedure Laterality Date   COLONOSCOPY     ESOPHAGOGASTRODUODENOSCOPY (EGD) WITH PROPOFOL N/A 03/19/2016   Procedure: ESOPHAGOGASTRODUODENOSCOPY (EGD) WITH PROPOFOL;  Surgeon: Christena Deem, MD;  Location: [facility] ENDOSCOPY;  Service: Endoscopy;  Laterality: N/A;    SOCIAL HISTORY: Social History   Socioeconomic History   Marital status: Single    Spouse name: Not on file   Number of children: Not on file   Years of education: Not on file   Highest education level: Not on file  Occupational History   Not on file  Tobacco Use   Smoking status: Every Day    Current packs/day: 0.25    Average packs/day: 0.3 packs/day for 48.0 years (12.0 ttl pk-yrs)    Types: Cigarettes    Passive exposure: Current   Smokeless tobacco: Never  Vaping Use   Vaping status: Never Used  Substance  and Sexual Activity   Alcohol use: Yes    Alcohol/week: 6.0 standard drinks of alcohol    Types: 6 Cans of beer per week   Drug use: No   Sexual activity: Not on file  Other Topics Concern   Not on file  Social History Narrative   Not on file   Social Determinants of Health   Financial Resource Strain: Low Risk  (11/27/2022)   Overall Financial Resource Strain (CARDIA)    Difficulty of Paying Living Expenses: Not very hard  Food Insecurity: Food Insecurity Present (11/27/2022)   Hunger Vital Sign    Worried About Running Out of Food in the Last Year: Sometimes true    Ran Out of Food in the Last Year: Never true  Transportation Needs: No Transportation Needs (11/27/2022)   PRAPARE - Administrator, Civil Service (Medical): No    Lack of Transportation (Non-Medical): No  Physical Activity: Not on file  Stress: No Stress Concern Present (11/27/2022)   [employer] of Occupational Health - Occupational Stress Questionnaire    Feeling of Stress : Only a little  Social Connections: Not on file  Intimate Partner Violence: Not At Risk (11/27/2022)   Humiliation, Afraid, Rape, and Kick questionnaire    Fear of Current or Ex-Partner: No    Emotionally Abused: No    Physically Abused: No    Sexually Abused: No    FAMILY HISTORY: History reviewed. No pertinent family history.  ALLERGIES:  has No Known Allergies.  MEDICATIONS:  Current Outpatient Medications  Medication Sig Dispense Refill   amLODipine (NORVASC) 10 MG tablet Take 10 mg by mouth daily.     buPROPion (WELLBUTRIN SR) 150 MG 12 hr tablet Take 150 mg by mouth 2 (two) times daily.     Calcium Carb-Cholecalciferol 600-20 MG-MCG TABS Take by mouth.     ciprofloxacin (CIPRO) 500 MG tablet Take by mouth.     hydrochlorothiazide (HYDRODIURIL) 12.5 MG tablet Take 12.5 mg by mouth daily.     metFORMIN (GLUCOPHAGE) 500 MG tablet Take by mouth.     Na Sulfate-K Sulfate-Mg Sulf 17.5-3.13-1.6 GM/177ML SOLN See admin  instructions. follow package directions     NUBEQA 300 MG tablet Take by mouth.     oxyCODONE-acetaminophen (PERCOCET) 5-325 MG tablet Take 1 tablet by mouth every 6 (six) hours as needed for severe pain. 20 tablet 0   pantoprazole (PROTONIX) 40 MG tablet Take 40 mg by mouth daily.     potassium chloride SA (KLOR-CON M) 20 MEQ tablet TAKE 2 TABLETS BY MOUTH DAILY FOR POTASSIUM     pravastatin (PRAVACHOL) 20 MG tablet      senna-docusate (SENOKOT-S) 8.6-50 MG tablet Take by mouth.     tamsulosin (FLOMAX) 0.4 MG  CAPS capsule Take 1 capsule by mouth daily.     No current facility-administered medications for this visit.    Review of Systems  Constitutional:  Negative for appetite change, chills, fatigue, fever and unexpected weight change.  HENT:   Negative for hearing loss and voice change.   Eyes:  Negative for eye problems and icterus.  Respiratory:  Negative for chest tightness, cough and shortness of breath.   Cardiovascular:  Negative for chest pain and leg swelling.  Gastrointestinal:  Negative for abdominal distention and abdominal pain.  Endocrine: Negative for hot flashes.  Genitourinary:  Negative for difficulty urinating, dysuria and frequency.        Foley catheter  Musculoskeletal:  Negative for arthralgias.  Skin:  Negative for itching and rash.  Neurological:  Negative for light-headedness and numbness.  Hematological:  Negative for adenopathy. Does not bruise/bleed easily.  Psychiatric/Behavioral:  Negative for confusion.      PHYSICAL EXAMINATION: ECOG PERFORMANCE STATUS: 1 - Symptomatic but completely ambulatory  Vitals:   11/27/22 0903  BP: 135/76  Pulse: 98  Resp: 18  Temp: (!) 96 F (35.6 C)  SpO2: 100%   Filed Weights   11/27/22 0903  Weight: 147 lb 6.4 oz (66.9 kg)    Physical Exam   LABORATORY DATA:  I have reviewed the data as listed    Latest Ref Rng & Units 11/27/2022    9:49 AM 11/23/2022    1:12 PM 12/19/2021    4:21 AM  CBC  WBC 4.0 -  10.5 K/uL 7.7  9.3  15.1   Hemoglobin 13.0 - 17.0 g/dL 95.0  93.2  67.1   Hematocrit 39.0 - 52.0 % 34.6  33.5  33.0   Platelets 150 - 400 K/uL 511  415  650       Latest Ref Rng & Units 11/27/2022    9:49 AM 11/23/2022    1:12 PM 12/19/2021    4:21 AM  CMP  Glucose 70 - 99 mg/dL 245  809  983   BUN 8 - 23 mg/dL 12  9  <5   Creatinine 0.61 - 1.24 mg/dL 3.82  5.05  3.97   Sodium 135 - 145 mmol/L 128  129  133   Potassium 3.5 - 5.1 mmol/L 3.4  3.5  2.5   Chloride 98 - 111 mmol/L 89  92  91   CO2 22 - 32 mmol/L 27  24  32   Calcium 8.9 - 10.3 mg/dL 9.4  9.3  9.4   Total Protein 6.5 - 8.1 g/dL 9.1   8.1   Total Bilirubin 0.3 - 1.2 mg/dL 0.8   1.1   Alkaline Phos 38 - 126 U/L 96   75   AST 15 - 41 U/L 18   18   ALT 0 - 44 U/L 11   10      RADIOGRAPHIC STUDIES: I have personally reviewed the radiological images as listed and agreed with the findings in the report. CT Angio Chest PE W and/or Wo Contrast  Result Date: 11/23/2022 CLINICAL DATA:  High probability for acute pulmonary embolism. Complains of intermittent left-sided chest pain for 2 days. EXAM: CT ANGIOGRAPHY CHEST WITH CONTRAST TECHNIQUE: Multidetector CT imaging of the chest was performed using the standard protocol during bolus administration of intravenous contrast. Multiplanar CT image reconstructions and MIPs were obtained to evaluate the vascular anatomy. RADIATION DOSE REDUCTION: This exam was performed according to the departmental dose-optimization program which includes automated exposure control,  adjustment of the mA and/or kV according to patient size and/or use of iterative reconstruction technique. CONTRAST:  75mL OMNIPAQUE IOHEXOL 350 MG/ML SOLN COMPARISON:  None Available. FINDINGS: Cardiovascular: Satisfactory opacification of the pulmonary arteries to the segmental level. No evidence of pulmonary embolism. Normal heart size. No pericardial effusion. Mild aortic atherosclerotic calcifications. Mediastinum/Nodes:  Prominent mediastinal and hilar lymph nodes are identified. Right hilar node measures 1.1 cm. The right paratracheal lymph node measures 1.3 cm, image 61/4. Thyroid gland, trachea, and esophagus are unremarkable. Lungs/Pleura: No pleural effusion, airspace consolidation, atelectasis, or pneumothorax. There is a focal area of bronchiectasis with adjacent peripheral tree-in-bud nodularity in the right upper lobe, image 56/6. Irregular subpleural nodule in the anterior left upper lobe measures 4 mm. Subpleural nodule in the periphery of the right lower lobe measures 4 mm, image 125/6. Upper Abdomen: No acute abnormality. Bilateral adrenal gland hyperplasia. Gallstones. Musculoskeletal: The bones appear diffusely abnormal with multifocal areas of abnormal increased sclerosis noted throughout the thoracic spine, sternum and ribs concerning for diffuse osseous metastasis. Review of the MIP images confirms the above findings. IMPRESSION: 1. No evidence for acute pulmonary embolus. 2. Diffusely abnormal bones with multifocal areas of abnormal increased sclerosis noted throughout the thoracic spine, sternum and ribs concerning for diffuse osseous metastasis. 3. Prominent mediastinal and hilar lymph nodes are identified. These are nonspecific and may be reactive. Nodal metastasis not excluded. 4. Focal area of bronchiectasis with adjacent peripheral tree-in-bud nodularity in the right upper lobe. This is favored to represent sequelae of prior infection or inflammation. 5. Bilateral adrenal gland hyperplasia. 6. Cholelithiasis. 7. Irregular subpleural nodule in the anterior left upper lobe measures 4 mm. 8.  Aortic Atherosclerosis (ICD10-I70.0). Electronically Signed   By: Signa Kell M.D.   On: 11/23/2022 15:57   DG Chest 2 View  Result Date: 11/23/2022 CLINICAL DATA:  Chest Pain EXAM: CHEST - 2 VIEW COMPARISON:  None Available. FINDINGS: No pleural effusion no pneumothorax. No focal airspace opacity. Normal cardiac  and mediastinal contours. No radiographically apparent displaced rib fractures. Visualized upper abdomen is unremarkable. Vertebral body heights are maintained. IMPRESSION: No focal airspace opacity. Electronically Signed   By: Lorenza Cambridge M.D.   On: 11/23/2022 13:50

## 2022-11-28 ENCOUNTER — Telehealth: Payer: Self-pay

## 2022-11-28 ENCOUNTER — Ambulatory Visit: Payer: Medicaid Other | Attending: Cardiology

## 2022-11-28 DIAGNOSIS — C7951 Secondary malignant neoplasm of bone: Secondary | ICD-10-CM

## 2022-11-28 DIAGNOSIS — R072 Precordial pain: Secondary | ICD-10-CM | POA: Diagnosis not present

## 2022-11-28 LAB — ECHOCARDIOGRAM COMPLETE
Area-P 1/2: 3.03 cm2
S' Lateral: 2.6 cm

## 2022-11-28 NOTE — Telephone Encounter (Signed)
called and letft another message on Louis Gardner's ph to call PET department and set up PET scan. [phone] or [phone]

## 2022-11-28 NOTE — Telephone Encounter (Signed)
-----   Message from [place] sent at 11/28/2022  9:30 AM EDT ----- Regarding: FW: Pluvicto Consult Referal The patient will need a PSMA PET scan first. Please place an order for a PSMA PET scan and I will set up the consult after the PET scan is completed. Thanks! ----- Message ----- From: Sherlean Foot Sent: 11/27/2022   2:07 PM EDT To: Lorna Few, MD; Coralee Rud, RN; # Subject: Pluvicto Consult Referal                       Hi Dr. Amil Amen,  Dr. Cathie Hoops sent a referral to consult/treatment for Pluvicto therapy. Let me know if he's a good candidate and I'll get him going.   Thanks!

## 2022-11-28 NOTE — Telephone Encounter (Signed)
Unable to reach pt via ph. Detailed message left on cell phone letting him know that Dr. Amil Amen will need a PET scan prior to setting up consult.   Please schedule PSMA PET and inform pt of appt.

## 2022-12-02 NOTE — Telephone Encounter (Signed)
Pt has been scheduled for PET on 8/6 and Radiology consult on 8/7.

## 2022-12-06 ENCOUNTER — Ambulatory Visit: Payer: Medicaid Other | Admitting: Oncology

## 2022-12-09 ENCOUNTER — Telehealth: Payer: Self-pay | Admitting: *Deleted

## 2022-12-09 ENCOUNTER — Encounter
Admission: RE | Disposition: A | Discharge status: Home / Self Care | Payer: Self-pay | Source: Home / Self Care | Attending: Gastroenterology

## 2022-12-09 ENCOUNTER — Ambulatory Visit: Payer: Medicaid Other | Admitting: Anesthesiology

## 2022-12-09 ENCOUNTER — Other Ambulatory Visit: Payer: Self-pay | Admitting: *Deleted

## 2022-12-09 ENCOUNTER — Ambulatory Visit
Admission: RE | Admit: 2022-12-09 | Discharge: 2022-12-09 | Disposition: A | Discharge status: Home / Self Care | Payer: Medicaid Other | Attending: Gastroenterology | Admitting: Gastroenterology

## 2022-12-09 DIAGNOSIS — R195 Other fecal abnormalities: Secondary | ICD-10-CM | POA: Diagnosis not present

## 2022-12-09 DIAGNOSIS — Z1211 Encounter for screening for malignant neoplasm of colon: Secondary | ICD-10-CM | POA: Insufficient documentation

## 2022-12-09 DIAGNOSIS — K624 Stenosis of anus and rectum: Secondary | ICD-10-CM | POA: Diagnosis not present

## 2022-12-09 HISTORY — PX: COLONOSCOPY WITH PROPOFOL: SHX5780

## 2022-12-09 SURGERY — COLONOSCOPY WITH PROPOFOL
Anesthesia: General

## 2022-12-09 MED ORDER — SODIUM CHLORIDE 0.9 % IV SOLN
INTRAVENOUS | Status: DC
  Administered 2022-12-09: 1000 mL via INTRAVENOUS

## 2022-12-09 MED ORDER — PROPOFOL 500 MG/50ML IV EMUL
INTRAVENOUS | Status: DC | PRN
  Administered 2022-12-09: 150 ug/kg/min via INTRAVENOUS
  Administered 2022-12-09: 50 mg via INTRAVENOUS

## 2022-12-09 MED ORDER — PEG 3350-KCL-NABCB-NACL-NASULF 236 G PO SOLR: 4000.0000 mL | Freq: Once | ORAL | 0 refills | Status: AC

## 2022-12-09 NOTE — H&P (Signed)
Wyline Mood, MD [address], [address], [place], [place], [address] [address], [address], [place], [place], [address] Phone: [phone]  Fax: [phone]  Primary Care Physician:  Gorden Harms, PA-C   Pre-Procedure History & Physical: HPI:  Louis Gardner is a 62 y.o. male is here for an colonoscopy.   Past Medical History:  Diagnosis Date   Arthritis    Depression    GI bleeding    Hypertension    Pancreatic cancer (HCC)    Sleep apnea     Past Surgical History:  Procedure Laterality Date   COLONOSCOPY     ESOPHAGOGASTRODUODENOSCOPY (EGD) WITH PROPOFOL N/A 03/19/2016   Procedure: ESOPHAGOGASTRODUODENOSCOPY (EGD) WITH PROPOFOL;  Surgeon: Christena Deem, MD;  Location: [facility] ENDOSCOPY;  Service: Endoscopy;  Laterality: N/A;    Prior to Admission medications   Medication Sig Start Date End Date Taking? Authorizing Provider  amLODipine (NORVASC) 10 MG tablet Take 10 mg by mouth daily. 04/19/21  Yes [provider]  hydrochlorothiazide (HYDRODIURIL) 12.5 MG tablet Take 12.5 mg by mouth daily.   Yes [provider]  buPROPion (WELLBUTRIN SR) 150 MG 12 hr tablet Take 150 mg by mouth 2 (two) times daily.    [provider]  Calcium Carb-Cholecalciferol 600-20 MG-MCG TABS Take by mouth. 06/04/21   [provider]  ciprofloxacin (CIPRO) 500 MG tablet Take by mouth. 10/28/22   [provider]  metFORMIN (GLUCOPHAGE) 500 MG tablet Take by mouth. 09/18/22   [provider]  Na Sulfate-K Sulfate-Mg Sulf 17.5-3.13-1.6 GM/177ML SOLN See admin instructions. follow package directions 10/17/22   [provider]  NUBEQA 300 MG tablet Take by mouth. 08/05/22   [provider]  oxyCODONE-acetaminophen (PERCOCET) 5-325 MG tablet Take 1 tablet by mouth every 6 (six) hours as needed for severe pain. 11/23/22 11/23/23  Jene Every, MD  pantoprazole (PROTONIX) 40 MG tablet Take 40 mg by mouth daily.    [provider]  potassium chloride SA (KLOR-CON M) 20 MEQ tablet TAKE 2 TABLETS BY MOUTH DAILY FOR POTASSIUM 10/25/22   [provider]  pravastatin (PRAVACHOL) 20 MG tablet  02/06/21   [provider]  senna-docusate (SENOKOT-S) 8.6-50 MG tablet Take by mouth. 10/28/22 10/28/23  [provider]  tamsulosin (FLOMAX) 0.4 MG CAPS capsule Take 1 capsule by mouth daily. 08/21/22   [provider]    Allergies as of 10/17/2022   (No Known Allergies)    No family history on file.  Social History   Socioeconomic History   Marital status: Single    Spouse name: Not on file   Number of children: Not on file   Years of education: Not on file   Highest education level: Not on file  Occupational History   Not on file  Tobacco Use   Smoking status: Every Day    Current packs/day: 0.25    Average packs/day: 0.3 packs/day for 48.0 years (12.0 ttl pk-yrs)    Types: Cigarettes    Passive exposure: Current   Smokeless tobacco: Never  Vaping Use   Vaping status: Never Used  Substance and Sexual Activity   Alcohol use: Yes    Alcohol/week: 6.0 standard drinks of alcohol    Types: 6 Cans of beer per week   Drug use: No   Sexual activity: Not on file  Other Topics Concern   Not on file  Social History Narrative   Not on file   Social Determinants of  Health   Financial Resource Strain: Low Risk  (11/27/2022)   Overall Financial Resource Strain (CARDIA)    Difficulty of Paying Living Expenses: Not very hard  Food Insecurity: Food Insecurity Present (11/27/2022)   Hunger Vital Sign    Worried About Running Out of Food in the Last Year: Sometimes true    Ran Out of Food in the Last Year: Never true  Transportation Needs: No Transportation Needs (11/27/2022)   PRAPARE - Administrator, Civil Service (Medical): No    Lack of Transportation (Non-Medical): No  Physical Activity: Not on file  Stress: No Stress Concern Present (11/27/2022)   [employer] of Occupational Health - Occupational Stress Questionnaire    Feeling of Stress : Only a little  Social Connections: Not on file  Intimate Partner Violence: Not At Risk (11/27/2022)   Humiliation, Afraid, Rape, and Kick questionnaire    Fear of Current or Ex-Partner: No    Emotionally Abused: No    Physically Abused: No    Sexually Abused: No    Review of Systems: See HPI, otherwise negative ROS  Physical Exam: BP (!) 112/98   Pulse 96   Temp 98.3 F (36.8 C) (Tympanic)   Resp 16   Ht 5\' 9"  (1.753 m)   Wt 66.4 kg   SpO2 99%   BMI 21.62 kg/m  General:   Alert,  pleasant and cooperative in NAD Head:  Normocephalic and atraumatic. Neck:  Supple; no masses or thyromegaly. Lungs:  Clear throughout to auscultation, normal respiratory effort.    Heart:  +S1, +S2, Regular rate and rhythm, No edema. Abdomen:  Soft, nontender and nondistended. Normal bowel sounds, without guarding, and without rebound.   Neurologic:  Alert and  oriented x4;  grossly normal neurologically.  Impression/Plan: Louis Gardner is here for an colonoscopy to be performed for Screening colonoscopy average risk   Risks, benefits, limitations, and alternatives regarding  colonoscopy have been reviewed with the patient.  Questions have been answered.  All parties agreeable.   Wyline Mood, MD  12/09/2022, 10:27 AM

## 2022-12-09 NOTE — Transfer of Care (Signed)
Immediate Anesthesia Transfer of Care Note  Patient: Louis Gardner  Procedure(s) Performed: COLONOSCOPY WITH PROPOFOL  Patient Location: PACU  Anesthesia Type:General  Level of Consciousness: awake and alert   Airway & Oxygen Therapy: Patient Spontanous Breathing and Patient connected to nasal cannula oxygen  Post-op Assessment: Report given to RN and Post -op Vital signs reviewed and stable  Post vital signs: Reviewed and stable  Last Vitals:  Vitals Value Taken Time  BP 109/61 12/09/22 1103  Temp    Pulse 81 12/09/22 1103  Resp 19 12/09/22 1103  SpO2 100 % 12/09/22 1103  Vitals shown include unfiled device data.  Last Pain:  Vitals:   12/09/22 1103  TempSrc:   PainSc: 0-No pain         Complications: No notable events documented.

## 2022-12-09 NOTE — Anesthesia Postprocedure Evaluation (Signed)
Anesthesia Post Note  Patient: Louis Gardner  Procedure(s) Performed: COLONOSCOPY WITH PROPOFOL  Patient location during evaluation: PACU Anesthesia Type: General Level of consciousness: awake and alert Pain management: pain level controlled Vital Signs Assessment: post-procedure vital signs reviewed and stable Respiratory status: spontaneous breathing, nonlabored ventilation and respiratory function stable Cardiovascular status: blood pressure returned to baseline and stable Postop Assessment: no apparent nausea or vomiting Anesthetic complications: no   No notable events documented.   Last Vitals:  Vitals:   12/09/22 1107 12/09/22 1142  BP: 133/73   Pulse:  80  Resp:  16  Temp:    SpO2:      Last Pain:  Vitals:   12/09/22 1103  TempSrc: Temporal  PainSc: 0-No pain                 Foye Deer

## 2022-12-09 NOTE — Op Note (Signed)
[facility] Gastroenterology Patient Name: Louis Gardner Procedure Date: 12/09/2022 10:46 AM MRN: [id number] Account #: [id number] Date of Birth: [birth year] Admit Type: Outpatient Age: 62 Room: [facility] ENDO ROOM 1 Gender: Male Note Status: Finalized Instrument Name: Prentice Docker [id number] Procedure:             Colonoscopy Indications:           Screening for colorectal malignant neoplasm Providers:             Wyline Mood MD, MD Referring MD:          Wyline Mood MD, MD (Referring MD), No Local Md, MD                         (Referring MD) Medicines:             Monitored Anesthesia Care Complications:         No immediate complications. Procedure:             Pre-Anesthesia Assessment:                        - Prior to the procedure, a History and Physical was                         performed, and patient medications, allergies and                         sensitivities were reviewed. The patient's tolerance                         of previous anesthesia was reviewed.                        - The risks and benefits of the procedure and the                         sedation options and risks were discussed with the                         patient. All questions were answered and informed                         consent was obtained.                        - ASA Grade Assessment: II - A patient with mild                         systemic disease.                        After obtaining informed consent, the colonoscope was                         passed under direct vision. Throughout the procedure,                         the patient's blood pressure, pulse, and oxygen                         saturations  were monitored continuously. The                         Colonoscope was introduced through the anus with the                         intention of advancing to the cecum. The scope was                         advanced to the rectum before the procedure was                          aborted. Medications were given. The colonoscopy was                         performed with ease. The patient tolerated the                         procedure well. The quality of the bowel preparation                         was inadequate. The colonoscopy was aborted due to                         poor bowel prep with stool present. The colonoscopy                         was performed with ease. The patient tolerated the                         procedure well. Findings:      The digital rectal exam findings include rectal stricture.      changed to upper scope as there was rectal stenosis, The scope was       withdrawn and replaced with the adult endoscope in order to accomplish       the maneuver.      A large amount of solid stool was found in the rectum, making       visualization difficult. Impression:            - Preparation of the colon was inadequate.                        - The procedure was aborted due to poor bowel prep                         with stool present.                        - Rectal stricture found on digital rectal exam.                        - Stool in the rectum.                        - No specimens collected. Recommendation:        - Discharge patient to home (with escort).                        -  Resume previous diet.                        - Continue present medications.                        - Repeat colonoscopy in 2 weeks because the bowel                         preparation was suboptimal. 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 All rights reserved. The codes documented in this report are preliminary and upon coder review may  be revised to meet current compliance requirements. Wyline Mood, MD Wyline Mood MD, MD 12/09/2022 11:01:59 AM This report has been signed electronically. Number of Addenda: 0 Note Initiated On: 12/09/2022 10:46 AM Total Procedure Duration: 0 hours 0 minutes 15 seconds  Estimated Blood Loss:  Estimated blood loss: none.      [facility]

## 2022-12-09 NOTE — Telephone Encounter (Signed)
Received a call that patient could not keep his prep solution down so he will need to reschedule his colonoscopy which was today 12/09/2022. Patient was also not cleaned out.  Requesting to reschedule to 01/16/2023.  New instructions will be send. Will send Golytely this time for patient so they can use gatorade or crystal light to flavor the prep solution.

## 2022-12-09 NOTE — Anesthesia Preprocedure Evaluation (Addendum)
Anesthesia Evaluation  Patient identified by MRN, date of birth, ID band Patient awake    Reviewed: Allergy & Precautions, H&P , NPO status , Patient's Chart, lab work & pertinent test results  Airway Mallampati: II  TM Distance: >3 FB Neck ROM: full    Dental  (+) Poor Dentition   Pulmonary Current Smoker   Pulmonary exam normal        Cardiovascular hypertension, Normal cardiovascular exam     Neuro/Psych  PSYCHIATRIC DISORDERS  Depression    negative neurological ROS     GI/Hepatic ,,,(+)     substance abuse  alcohol useH/o Pancreatic cancer Pt unable to tolerate large meals. He is easily bloated. He denies symptoms today.   Endo/Other  negative endocrine ROS    Renal/GU negative Renal ROS   metastatic prostate cancer    Musculoskeletal   Abdominal Normal abdominal exam  (+)   Peds  Hematology negative hematology ROS (+)   Anesthesia Other Findings Past Medical History: No date: Arthritis No date: Depression No date: GI bleeding No date: Hypertension No date: Pancreatic cancer (HCC) No date: Sleep apnea  Past Surgical History: No date: COLONOSCOPY 03/19/2016: ESOPHAGOGASTRODUODENOSCOPY (EGD) WITH PROPOFOL; N/A     Comment:  Procedure: ESOPHAGOGASTRODUODENOSCOPY (EGD) WITH               PROPOFOL;  Surgeon: Christena Deem, MD;  Location:               [facility] ENDOSCOPY;  Service: Endoscopy;  Laterality: N/A;  BMI    Body Mass Index: 21.62 kg/m      Reproductive/Obstetrics negative OB ROS                             Anesthesia Physical Anesthesia Plan  ASA: 3  Anesthesia Plan: General   Post-op Pain Management:    Induction: Intravenous  PONV Risk Score and Plan: Propofol infusion and TIVA  Airway Management Planned: Natural Airway  Additional Equipment:   Intra-op Plan:   Post-operative Plan:   Informed Consent: I have reviewed the patients History and  Physical, chart, labs and discussed the procedure including the risks, benefits and alternatives for the proposed anesthesia with the patient or authorized representative who has indicated his/her understanding and acceptance.     Dental Advisory Given  Plan Discussed with: CRNA and Surgeon  Anesthesia Plan Comments:         Anesthesia Quick Evaluation

## 2022-12-10 ENCOUNTER — Ambulatory Visit: Payer: Medicaid Other

## 2022-12-10 ENCOUNTER — Encounter: Payer: Self-pay | Admitting: Gastroenterology

## 2022-12-11 ENCOUNTER — Ambulatory Visit (HOSPITAL_COMMUNITY): Payer: Medicaid Other

## 2022-12-11 ENCOUNTER — Ambulatory Visit: Payer: Medicaid Other | Attending: Physician Assistant | Admitting: Cardiology

## 2022-12-11 ENCOUNTER — Encounter: Payer: Self-pay | Admitting: Cardiology

## 2022-12-11 VITALS — BP 124/60 | HR 98 | Ht 69.0 in | Wt 147.0 lb

## 2022-12-11 DIAGNOSIS — C61 Malignant neoplasm of prostate: Secondary | ICD-10-CM

## 2022-12-11 DIAGNOSIS — E782 Mixed hyperlipidemia: Secondary | ICD-10-CM

## 2022-12-11 DIAGNOSIS — I10 Essential (primary) hypertension: Secondary | ICD-10-CM

## 2022-12-11 DIAGNOSIS — N320 Bladder-neck obstruction: Secondary | ICD-10-CM

## 2022-12-11 DIAGNOSIS — C7951 Secondary malignant neoplasm of bone: Secondary | ICD-10-CM

## 2022-12-11 DIAGNOSIS — R0789 Other chest pain: Secondary | ICD-10-CM | POA: Diagnosis not present

## 2022-12-11 NOTE — Patient Instructions (Signed)
Medication Instructions:  Your physician recommends that you continue on your current medications as directed. Please refer to the Current Medication list given to you today.  *If you need a refill on your cardiac medications before your next appointment, please call your pharmacy*   Lab Work: none If you have labs (blood work) drawn today and your tests are completely normal, you will receive your results only by: MyChart Message (if you have MyChart) OR A paper copy in the mail If you have any lab test that is abnormal or we need to change your treatment, we will call you to review the results.   Testing/Procedures: none   Follow-Up: At [facility], you and your health needs are our priority.  As part of our continuing mission to provide you with exceptional heart care, we have created designated Provider Care Teams.  These Care Teams include your primary Cardiologist (physician) and Advanced Practice Providers (APPs -  Physician Assistants and Nurse Practitioners) who all work together to provide you with the care you need, when you need it.  We recommend signing up for the patient portal called "MyChart".  Sign up information is provided on this After Visit Summary.  MyChart is used to connect with patients for Virtual Visits (Telemedicine).  Patients are able to view lab/test results, encounter notes, upcoming appointments, etc.  Non-urgent messages can be sent to your provider as well.   To learn more about what you can do with MyChart, go to [email or web address].    Your next appointment:   6 month(s)  Provider:   Debbe Odea, MD

## 2022-12-11 NOTE — Progress Notes (Signed)
Cardiology Office Note:  .   Date:  12/11/2022  ID:  Carmie End, DOB [birth year], MRN [id number] PCP: Gorden Harms, PA-C  [facility] Providers Cardiologist:  Debbe Odea, MD    History of Present Illness: .   IQBAL CAMELO is a 62 y.o. male with past medical history of hypertension, former smoker years, hyperlipidemia, prostate cancer, who was recently evaluated in clinic for chest pain, who presents today for follow-up.  He was last seen in clinic 10/24/2022 by Dr Azucena Cecil with having intermittent chest pain for 2 weeks without prior cardiac history.  He states he was moving furniture approximately 2 weeks ago when he developed left-sided chest pain.  Denied any prior occurrence.  Massage his chest symptoms seem to improve.  Has not had any further episodes of chest discomfort since that time and had no other associated symptoms.  He was scheduled for an echocardiogram and continued on his current blood pressure medications.  Was evaluated in the ER [facility] emergency department on 11/01/2022 with complaint of bladder pain.  He had urinary retention.  Was seen by his provider at [facility] recently started on ciprofloxacin.  Unfortunately he was catheterized and had 700 cc of urine with resolution of symptoms.  He is continue to follow with urology since that time.  Was again evaluated at the [facility] emergency department 11/23/2022 who complained of left-sided chest pain.  He stated developed over the last couple days.  He describes it as achy pain that was worse with deep inspiration.  Vital signs were stable.  Pertinent labs revealed a sodium of 129, chloride of 92, blood glucose of 158, hemoglobin of 11.1, platelets of 415, EKG revealed sinus rhythm with no evidence of ischemia.  He was treated with Toradol 30 mg IV and oxycodone.  CT angiogram IV revealed multiple bony lesions concerning for metastatic disease.  Dr. Cathie Hoops of oncology recommended pain control and close outpatient follow-up  with her.  He returns to clinic today accompanied by family member.  States that he had previously supposed to have a colonoscopy but was unable to tolerate the medication and is that the procedure rescheduled.  He denies having any more chest pain or shortness of breath.  Continues to have Foley with leg bag on today.  He states that he does need supplies for his catheter today as he had used his last bag before leaving the house earlier.  Denied any recent hospitalizations or visits to the emergency department.  States that he has been compliant with his medications.  ROS: 10 point review of systems has been completed and considered negative except what is listed in the HPI  Studies Reviewed: Marland Kitchen       TTE 11/28/22  1. Left ventricular ejection fraction, by estimation, is 60 to 65%. The  left ventricle has normal function. The left ventricle has no regional  wall motion abnormalities. Left ventricular diastolic parameters are  consistent with Grade I diastolic  dysfunction (impaired relaxation). The average left ventricular global  longitudinal strain is -21.1 %.   2. Right ventricular systolic function is normal. The right ventricular  size is normal.   3. The mitral valve is normal in structure. Mild mitral valve  regurgitation. No evidence of mitral stenosis.   4. Tricuspid valve regurgitation is mild to moderate.   5. The aortic valve is tricuspid. Aortic valve regurgitation is not  visualized. No aortic stenosis is present.   6. The inferior vena cava is normal in size  with greater than 50%  respiratory variability, suggesting right atrial pressure of 3 mmHg.   Risk Assessment/Calculations:             Physical Exam:   VS:  BP 124/60 (BP Location: Left Arm, Patient Position: Sitting, Cuff Size: Normal)   Pulse 98   Ht 5\' 9"  (1.753 m)   Wt 147 lb (66.7 kg)   SpO2 99%   BMI 21.71 kg/m    Wt Readings from Last 3 Encounters:  12/11/22 147 lb (66.7 kg)  12/09/22 146 lb 6.4 oz  (66.4 kg)  11/27/22 147 lb 6.4 oz (66.9 kg)    GEN: Well nourished, well developed in no acute distress NECK: No JVD; No carotid bruits CARDIAC: RRR, no murmurs, rubs, gallops RESPIRATORY:  Clear with diminished bases to auscultation without rales, wheezing or rhonchi  ABDOMEN: Soft, non-tender, non-distended EXTREMITIES:  No edema; No deformity   ASSESSMENT AND PLAN: .   Atypical chest pain patient states has resolved.  Echocardiogram completed revealed LVEF of 60 to 65%, no regional wall motion abnormalities, G1 DD, mild mitral regurgitation and mild to moderate tricuspid regurgitation.  With his symptoms resolved and study without structural abnormalities will defer any further testing at this time.  Hypertension blood pressure today 120/68.  He is continued on HCTZ 12.5 mg daily and amlodipine 10 mg daily.  Blood pressures have remained stable and he required no refills to his medication at this time.  Bladder outlet obstruction with current indwelling Foley and leg bag intact.  That she was sent to urology for patient needing supplies.  Advised patient can swing by the office and pick up a goody bag.  Metastatic prostate cancer With lymph node and involvement.  Continues to be followed by oncology Dr. Cathie Hoops.  Mixed hyperlipidemia where he is continued on pravastatin 20 mg daily.  This continues to be managed by his PCP.       Dispo: Patient to return to clinic to see MD/APP in 6 months or sooner if needed.  Signed,  , NP

## 2022-12-15 ENCOUNTER — Emergency Department (HOSPITAL_COMMUNITY): Payer: Medicaid Other

## 2022-12-15 ENCOUNTER — Other Ambulatory Visit: Payer: Self-pay

## 2022-12-15 ENCOUNTER — Encounter (HOSPITAL_COMMUNITY): Payer: Self-pay

## 2022-12-15 ENCOUNTER — Emergency Department (HOSPITAL_COMMUNITY)
Admission: EM | Admit: 2022-12-15 | Discharge: 2022-12-15 | Disposition: A | Discharge status: Home / Self Care | Payer: Medicaid Other | Attending: Emergency Medicine | Admitting: Emergency Medicine

## 2022-12-15 DIAGNOSIS — E871 Hypo-osmolality and hyponatremia: Secondary | ICD-10-CM | POA: Insufficient documentation

## 2022-12-15 DIAGNOSIS — R109 Unspecified abdominal pain: Secondary | ICD-10-CM | POA: Diagnosis not present

## 2022-12-15 DIAGNOSIS — Z8546 Personal history of malignant neoplasm of prostate: Secondary | ICD-10-CM | POA: Insufficient documentation

## 2022-12-15 DIAGNOSIS — R110 Nausea: Secondary | ICD-10-CM | POA: Diagnosis present

## 2022-12-15 DIAGNOSIS — K5900 Constipation, unspecified: Secondary | ICD-10-CM

## 2022-12-15 LAB — URINALYSIS, ROUTINE W REFLEX MICROSCOPIC
Bilirubin Urine: NEGATIVE
Glucose, UA: 150 mg/dL — AB
Ketones, ur: 20 mg/dL — AB
Nitrite: NEGATIVE
Protein, ur: 30 mg/dL — AB
RBC / HPF: >50 RBC/hpf (ref 0–5)
Specific Gravity, Urine: >1.046 — ABNORMAL HIGH (ref 1.005–1.030)
WBC, UA: >50 WBC/hpf (ref 0–5)
pH: 6 (ref 5.0–8.0)

## 2022-12-15 LAB — BASIC METABOLIC PANEL WITH GFR
Anion gap: 16 — ABNORMAL HIGH (ref 5–15)
BUN: 7 mg/dL — ABNORMAL LOW (ref 8–23)
CO2: 25 mmol/L (ref 22–32)
Calcium: 9.3 mg/dL (ref 8.9–10.3)
Chloride: 86 mmol/L — ABNORMAL LOW (ref 98–111)
Creatinine, Ser: 0.77 mg/dL (ref 0.61–1.24)
GFR, Estimated: >60 mL/min
Glucose, Bld: 205 mg/dL — ABNORMAL HIGH (ref 70–99)
Potassium: 3.2 mmol/L — ABNORMAL LOW (ref 3.5–5.1)
Sodium: 127 mmol/L — ABNORMAL LOW (ref 135–145)

## 2022-12-15 LAB — CBC
HCT: 30.4 % — ABNORMAL LOW (ref 39.0–52.0)
Hemoglobin: 10.1 g/dL — ABNORMAL LOW (ref 13.0–17.0)
MCH: 24.5 pg — ABNORMAL LOW (ref 26.0–34.0)
MCHC: 33.2 g/dL (ref 30.0–36.0)
MCV: 73.6 fL — ABNORMAL LOW (ref 80.0–100.0)
Platelets: 623 K/uL — ABNORMAL HIGH (ref 150–400)
RBC: 4.13 MIL/uL — ABNORMAL LOW (ref 4.22–5.81)
RDW: 15.8 % — ABNORMAL HIGH (ref 11.5–15.5)
WBC: 10.6 K/uL — ABNORMAL HIGH (ref 4.0–10.5)
nRBC: 0 % (ref 0.0–0.2)

## 2022-12-15 LAB — HEPATIC FUNCTION PANEL
ALT: 7 U/L (ref 0–44)
AST: 25 U/L (ref 15–41)
Albumin: 3.3 g/dL — ABNORMAL LOW (ref 3.5–5.0)
Alkaline Phosphatase: 92 U/L (ref 38–126)
Bilirubin, Direct: 0.3 mg/dL — ABNORMAL HIGH (ref 0.0–0.2)
Indirect Bilirubin: 1.1 mg/dL — ABNORMAL HIGH (ref 0.3–0.9)
Total Bilirubin: 1.4 mg/dL — ABNORMAL HIGH (ref 0.3–1.2)
Total Protein: 9.2 g/dL — ABNORMAL HIGH (ref 6.5–8.1)

## 2022-12-15 LAB — LIPASE, BLOOD: Lipase: 26 U/L (ref 11–51)

## 2022-12-15 MED ORDER — SENNOSIDES-DOCUSATE SODIUM 8.6-50 MG PO TABS: 1.0000 | ORAL_TABLET | Freq: Every day | ORAL | 0 refills | Status: DC | PRN

## 2022-12-15 MED ORDER — FLEET ENEMA 7-19 GM/118ML RE ENEM
1.0000 | ENEMA | Freq: Once | RECTAL | Status: AC
  Administered 2022-12-15: 1 via RECTAL
  Filled 2022-12-15: qty 1

## 2022-12-15 MED ORDER — BISACODYL 10 MG RE SUPP: 10.0000 mg | Freq: Every day | RECTAL | 0 refills | Status: DC | PRN

## 2022-12-15 MED ORDER — IOHEXOL 300 MG/ML  SOLN
100.0000 mL | Freq: Once | INTRAMUSCULAR | Status: AC | PRN
  Administered 2022-12-15: 100 mL via INTRAVENOUS

## 2022-12-15 MED ORDER — LIDOCAINE HCL URETHRAL/MUCOSAL 2 % EX GEL
1.0000 | Freq: Once | CUTANEOUS | Status: AC
  Administered 2022-12-15: 1 via URETHRAL
  Filled 2022-12-15: qty 11

## 2022-12-15 MED ORDER — SODIUM CHLORIDE 0.9 % IV BOLUS
1000.0000 mL | Freq: Once | INTRAVENOUS | Status: AC
  Administered 2022-12-15: 1000 mL via INTRAVENOUS

## 2022-12-15 NOTE — ED Provider Notes (Signed)
[facility] EMERGENCY DEPARTMENT AT [facility] Provider Note   CSN: [id number] Arrival date & time: 12/15/22  1215     History  Chief Complaint  Patient presents with   Constipation   foley catheter pain    Louis Gardner is a 62 y.o. 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  Patient ports has not had a bowel movement in 8 days which is unusual for him.  He has been trying laxative medications with no relief.  He is passing "only a little gas".  He reports nausea and loss of appetite.  He feels that his abdomen is bloated.  He has a chronic hernia but says it is not causing him more pain around his umbilicus.  He also reports that he has not had his Foley catheter exchanged for 1 month.  Patient does NOT complain of urethral pain to me.  Last seen by oncology approxi-1 month ago on July 24 my review of external records, noting patient has a history of metastatic prostate cancer with lymph node and bone involvement  Urology last placed foley by urology 11/13/22 per records  HPI     Home Medications Prior to Admission medications   Medication Sig Start Date End Date Taking? Authorizing Provider  amLODipine (NORVASC) 10 MG tablet Take 10 mg by mouth daily. 04/19/21  Yes [provider]  bisacodyl (DULCOLAX) 10 MG suppository Place 1 suppository (10 mg total) rectally daily as needed for up to 12 doses for moderate constipation. 12/15/22  Yes Terald Sleeper, MD  GAVILYTE-G 236 g solution Take 4,000 mLs by mouth as directed. 12/10/22  Yes [provider]  metFORMIN (GLUCOPHAGE) 500 MG tablet Take by mouth. 09/18/22  Yes [provider]  pantoprazole (PROTONIX) 40 MG tablet Take 40 mg by mouth daily.   Yes [provider]  potassium chloride SA (KLOR-CON M) 20 MEQ tablet  TAKE 2 TABLETS BY MOUTH DAILY FOR POTASSIUM 10/25/22  Yes [provider]  senna-docusate (SENOKOT-S) 8.6-50 MG tablet Take 1 tablet by mouth daily as needed for up to 30 doses for mild constipation. 12/15/22  Yes , Kermit Balo, MD  oxyCODONE-acetaminophen (PERCOCET) 5-325 MG tablet Take 1 tablet by mouth every 6 (six) hours as needed for severe pain. Patient not taking: Reported on 12/11/2022 11/23/22 11/23/23  Jene Every, MD  pravastatin (PRAVACHOL) 20 MG tablet  02/06/21   [provider]  tamsulosin (FLOMAX) 0.4 MG CAPS capsule Take 1 capsule by mouth daily. Patient not taking: Reported on 12/11/2022 08/21/22   [provider]      Allergies    Patient has no known allergies.    Review of Systems   Review of Systems  Physical Exam Updated Vital Signs BP 137/79   Pulse 79   Temp 98.2 F (36.8 C) (Oral)   Resp 17   Ht 5\' 9"  (1.753 m)   Wt 68 kg   SpO2 100%   BMI 22.15 kg/m  Physical Exam Constitutional:      General: He is not in acute distress. HENT:     Head: Normocephalic and atraumatic.  Eyes:     Conjunctiva/sclera: Conjunctivae normal.     Pupils: Pupils are equal, round, and reactive to light.  Cardiovascular:     Rate and Rhythm: Normal rate and regular rhythm.  Pulmonary:  Effort: Pulmonary effort is normal. No respiratory distress.  Abdominal:     General: There is distension.     Tenderness: There is no abdominal tenderness. There is no guarding.     Comments: Soft and reducible umbilical hernia  Genitourinary:    Comments: Indwelling foley with no penile drainage or meatus erythema Skin:    General: Skin is warm and dry.  Neurological:     General: No focal deficit present.     Mental Status: He is alert. Mental status is at baseline.  Psychiatric:        Mood and Affect: Mood normal.        Behavior: Behavior normal.     ED Results / Procedures / Treatments   Labs (all labs ordered are listed, but only abnormal  results are displayed) Labs Reviewed  CBC - Abnormal; Notable for the following components:      Result Value   WBC 10.6 (*)    RBC 4.13 (*)    Hemoglobin 10.1 (*)    HCT 30.4 (*)    MCV 73.6 (*)    MCH 24.5 (*)    RDW 15.8 (*)    Platelets 623 (*)    All other components within normal limits  BASIC METABOLIC PANEL - Abnormal; Notable for the following components:   Sodium 127 (*)    Potassium 3.2 (*)    Chloride 86 (*)    Glucose, Bld 205 (*)    BUN 7 (*)    Anion gap 16 (*)    All other components within normal limits  HEPATIC FUNCTION PANEL - Abnormal; Notable for the following components:   Total Protein 9.2 (*)    Albumin 3.3 (*)    Total Bilirubin 1.4 (*)    Bilirubin, Direct 0.3 (*)    Indirect Bilirubin 1.1 (*)    All other components within normal limits  URINE CULTURE  LIPASE, BLOOD  URINALYSIS, ROUTINE W REFLEX MICROSCOPIC    EKG None  Radiology CT ABDOMEN PELVIS W CONTRAST  Result Date: 12/15/2022 CLINICAL DATA:  History of metastatic prostate cancer with abdominal distention, nausea, and vomiting with absence of bowel movement in over a week. * Tracking Code: BO * EXAM: CT ABDOMEN AND PELVIS WITH CONTRAST TECHNIQUE: Multidetector CT imaging of the abdomen and pelvis was performed using the standard protocol following bolus administration of intravenous contrast. RADIATION DOSE REDUCTION: This exam was performed according to the departmental dose-optimization program which includes automated exposure control, adjustment of the mA and/or kV according to patient size and/or use of iterative reconstruction technique. CONTRAST:  OMNIPAQUE IOHEXOL 300 MG/ML  SOLN COMPARISON:  CTA chest dated 11/23/2022, CT abdomen and pelvis dated 05/28/2019 FINDINGS: Lower chest: Scattered ground-glass and nodular opacities are again seen in the right middle and bilateral lower lobes. No pleural effusion or pneumothorax demonstrated. Partially imaged heart size is normal.  Hepatobiliary: Subcentimeter hypodensities in segment 2 (2:13, 17), too small to characterize. No intra or extrahepatic biliary ductal dilation. Cholelithiasis. Pancreas: Marked interval decrease in size of hypoattenuating cystic focus within the uncinate process, now measuring 0.9 x 0.6 cm (2:34), previously up to 6.1 x 4.7 cm. A 7 mm focus of adjacent enhancement (2:34) was likely present on 05/28/2019. No main ductal dilation. Spleen: Normal in size without focal abnormality. Adrenals/Urinary Tract: No adrenal nodules. No suspicious renal mass, calculi or hydronephrosis. Urinary bladder is decompressed with catheter in-situ. Layering intraluminal hyperattenuation. Stomach/Bowel: Normal appearance of the stomach. No abnormal bowel wall  thickening. Dilated, stool-filled rectum. Moderate to large volume stool within the remainder of the colon. Colonic diverticulosis without acute diverticulitis. Normal appendix. Vascular/Lymphatic: No significant vascular findings are present. No enlarged abdominal or pelvic lymph nodes. Reproductive: Marked interval decrease in size of the prostate gland, which demonstrates heterogeneous enhancement and lobulated contour. Other: No free fluid, fluid collection, or free air. Musculoskeletal: Diffuse heterogeneously sclerotic appearance of the bone marrow. Postsurgical changes of the anterior abdominal wall. Small fat-containing paraumbilical hernia. IMPRESSION: 1. Dilated, stool-filled rectum with moderate to large volume stool within the remainder of the colon, consistent with constipation. 2. Marked interval decrease in size of the prostate gland, which demonstrates heterogeneous enhancement and lobulated contour, likely reflecting treatment response. 3. Diffuse heterogeneously sclerotic appearance of the bone marrow, consistent with known osseous metastases. 4. Marked interval decrease in size of hypoattenuating cystic focus within the pancreatic uncinate process. Recommend  continued attention on follow-up. 5. Subcentimeter hepatic hypodensities in segment 2 are too small to characterize. Attention on follow-up. 6. Scattered ground-glass and nodular opacities are again seen in the right middle and bilateral lower lobes, which may represent sequela of infection or inflammation. 7. Cholelithiasis. 8. Colonic diverticulosis without acute diverticulitis. Electronically Signed   By: Agustin Cree M.D.   On: 12/15/2022 18:12    Procedures Procedures    Medications Ordered in ED Medications  lidocaine (XYLOCAINE) 2 % jelly 1 Application (1 Application Urethral Given 12/15/22 1847)  sodium chloride 0.9 % bolus 1,000 mL (0 mLs Intravenous Stopped 12/15/22 1915)  iohexol (OMNIPAQUE) 300 MG/ML solution 100 mL (100 mLs Intravenous Contrast Given 12/15/22 1649)  sodium phosphate (FLEET) 7-19 GM/118ML enema 1 enema (1 enema Rectal Given 12/15/22 1847)    ED Course/ Medical Decision Making/ A&P Clinical Course as of 12/15/22 2259  Sun Dec 15, 2022  1840 Large stool ball in rectum - patient is wanting to try enema as opposed to digital disimpaction, RN notified and will attempt [MT]  2026 Large BM with enema.  Okay for discharge [MT]  2222 Multiple further bowel movements [MT]    Clinical Course User Index [MT] , Kermit Balo, MD                                 Medical Decision Making Amount and/or Complexity of Data Reviewed Labs: ordered. Radiology: ordered.  Risk OTC drugs. Prescription drug management.   This patient presents to the ED with concern for abdominal distention, nausea, constipation. This involves an extensive number of treatment options, and is a complaint that carries with it a high risk of complications and morbidity.  The differential diagnosis includes bowel obstruction versus obstructing mass or lesion versus ileus versus other  Co-morbidities that complicate the patient evaluation: History of pancreatic mass and prostate cancer, at high risk of  obstructing lesion  Additional history obtained from pt's family at bedside  External records from outside source obtained and reviewed including oncology office eval  I ordered and personally interpreted labs.  The pertinent results include:  NA 127, K 3.2, Cr 0.77.    I ordered imaging studies including CT abdomen pelvis I independently visualized and interpreted imaging which showed significant constipation I agree with the radiologist interpretation  The patient was maintained on a cardiac monitor.  I personally viewed and interpreted the cardiac monitored which showed an underlying rhythm of: NSR  I ordered medication including NS bolus for hyponatremia  I have reviewed the  patients home medicines and have made adjustments as needed   After the interventions noted above, I reevaluated the patient and found that they have: improved   Dispostion:  After consideration of the diagnostic results and the patients response to treatment, I feel that the patent would benefit from close outpatient follow up         Final Clinical Impression(s) / ED Diagnoses Final diagnoses:  Hyponatremia  Constipation, unspecified constipation type    Rx / DC Orders ED Discharge Orders          Ordered    bisacodyl (DULCOLAX) 10 MG suppository  Daily PRN        12/15/22 2132    senna-docusate (SENOKOT-S) 8.6-50 MG tablet  Daily PRN        12/15/22 2132              Terald Sleeper, MD 12/15/22 2259

## 2022-12-15 NOTE — Discharge Instructions (Addendum)
Please follow up with your primary care clinic this week to recheck your electrolytes and your symptoms.  Your CT scan today showed that you are very constipated.  We gave you an enema which helped with a bowel movement.  You should consider using the prescribed stool softeners, laxatives, and suppositories at home to continue moving your bowels.  Your sodium level was also low today, and we gave you saline fluids.  You will need to follow-up with your primary care doctor for this issue.  Your Foley catheter was exchanged in the ER today.  This catheter should be exchanged once a month.  This should be done through your doctor's clinic or the urology clinic, or with home health nursing.

## 2022-12-15 NOTE — ED Triage Notes (Signed)
Pt arrived via POV. C/o constipation for 8x days, only small BMs. Also c/o urethral pain from foley catheter since this AM.  AOx4

## 2022-12-17 ENCOUNTER — Other Ambulatory Visit: Payer: Medicaid Other

## 2022-12-18 ENCOUNTER — Other Ambulatory Visit: Payer: Medicaid Other

## 2022-12-18 ENCOUNTER — Ambulatory Visit
Admission: RE | Admit: 2022-12-18 | Discharge: 2022-12-18 | Disposition: A | Discharge status: Home / Self Care | Payer: Medicaid Other | Source: Ambulatory Visit | Attending: Oncology | Admitting: Oncology

## 2022-12-18 DIAGNOSIS — C61 Malignant neoplasm of prostate: Secondary | ICD-10-CM | POA: Insufficient documentation

## 2022-12-18 DIAGNOSIS — C7951 Secondary malignant neoplasm of bone: Secondary | ICD-10-CM | POA: Insufficient documentation

## 2022-12-18 MED ORDER — PIFLIFOLASTAT F 18 (PYLARIFY) INJECTION
9.0000 | Freq: Once | INTRAVENOUS | Status: AC
  Administered 2022-12-18: 9.24 via INTRAVENOUS

## 2022-12-19 ENCOUNTER — Other Ambulatory Visit: Payer: Self-pay | Admitting: Oncology

## 2022-12-19 ENCOUNTER — Telehealth (HOSPITAL_BASED_OUTPATIENT_CLINIC_OR_DEPARTMENT_OTHER): Payer: Self-pay | Admitting: *Deleted

## 2022-12-19 ENCOUNTER — Ambulatory Visit (HOSPITAL_COMMUNITY)
Admission: RE | Admit: 2022-12-19 | Discharge: 2022-12-19 | Disposition: A | Discharge status: Home / Self Care | Payer: Medicaid Other | Source: Ambulatory Visit | Attending: Oncology | Admitting: Oncology

## 2022-12-19 DIAGNOSIS — C61 Malignant neoplasm of prostate: Secondary | ICD-10-CM | POA: Insufficient documentation

## 2022-12-19 DIAGNOSIS — C7951 Secondary malignant neoplasm of bone: Secondary | ICD-10-CM | POA: Insufficient documentation

## 2022-12-19 NOTE — Telephone Encounter (Signed)
Post ED Visit - Positive Culture Follow-up  Culture report reviewed by antimicrobial stewardship pharmacist: [facility] []  Enzo Bi, Pharm.D. []  Celedonio Miyamoto, Pharm.D., BCPS AQ-ID []  Garvin Fila, Pharm.D., BCPS []  Georgina Pillion, [address].D., BCPS []  [place], [address].D., BCPS, AAHIVP []  Estella Husk, Pharm.D., BCPS, AAHIVP []  Lysle Pearl, PharmD, BCPS []  Phillips Climes, PharmD, BCPS []  Agapito Games, PharmD, BCPS []  Verlan Friends, PharmD []  Mervyn Gay, PharmD, BCPS []  Vinnie Level, PharmD  [facility] [x]  Sharin Mons PharmD []  Greer Pickerel, PharmD []  Adalberto Cole, PharmD []  Perlie Gold, Rph []  Lonell Face) Jean Rosenthal, PharmD []  Earl Many, PharmD []  Junita Push, PharmD []  Dorna Leitz, PharmD []  Terrilee Files, PharmD []  Lynann Beaver, PharmD []  Keturah Barre, PharmD []  Loralee Pacas, PharmD []  Bernadene Person, PharmD   Positive urin culture No Treatment because likely asymptomatic bacteriuria  and no further patient follow-up is required at this time. Claude Manges PAC  Nena Polio Garner Nash 12/19/2022, 1:39 PM

## 2022-12-19 NOTE — Consult Note (Signed)
Chief Complaint: Louis Gardner resistant metastatic prostate carcinoma.  Progression on chemotherapy.  Local recurrence in the pelvis and progressive skeletal metastasis.]    Referring Physician(s):Yu    Patient Status: [facility] - Out-pt  History of Present Illness: Louis Gardner is a 62 y.o. male With metastatic prostate carcinoma identified in 2022.  Patient has had received androgen deprivation and subsequent chemotherapy.  Metastatic disease on presentation.  Evidence of progression on chemotherapy with increase in PSA.     Recent PSMA PET scan demonstrate local recurrence within the prostate bed/RIGHT similar vesicle as well as focal radiotracer activity within the skeleton (PSMA PET scan 12/18/2022) .   Past Medical History:  Diagnosis Date   Arthritis    Depression    GI bleeding    Hypertension    Pancreatic cancer (HCC)    Sleep apnea     Past Surgical History:  Procedure Laterality Date   COLONOSCOPY     COLONOSCOPY WITH PROPOFOL N/A 12/09/2022   Procedure: COLONOSCOPY WITH PROPOFOL;  Surgeon: Wyline Mood, MD;  Location: [facility] ENDOSCOPY;  Service: Gastroenterology;  Laterality: N/A;   ESOPHAGOGASTRODUODENOSCOPY (EGD) WITH PROPOFOL N/A 03/19/2016   Procedure: ESOPHAGOGASTRODUODENOSCOPY (EGD) WITH PROPOFOL;  Surgeon: Christena Deem, MD;  Location: [facility] ENDOSCOPY;  Service: Endoscopy;  Laterality: N/A;    Allergies: Patient has no known allergies.  Medications: Prior to Admission medications   Medication Sig Start Date End Date Taking? Authorizing Provider  amLODipine (NORVASC) 10 MG tablet Take 10 mg by mouth daily. 04/19/21   [provider]  bisacodyl (DULCOLAX) 10 MG suppository Place 1 suppository (10 mg total) rectally daily as needed for up to 12 doses for moderate constipation. 12/15/22   Terald Sleeper, MD  GAVILYTE-G 236 g solution Take 4,000 mLs by mouth as directed. 12/10/22   [provider]  metFORMIN (GLUCOPHAGE) 500 MG tablet  Take by mouth. 09/18/22   [provider]  oxyCODONE-acetaminophen (PERCOCET) 5-325 MG tablet Take 1 tablet by mouth every 6 (six) hours as needed for severe pain. Patient not taking: Reported on 12/11/2022 11/23/22 11/23/23  Jene Every, MD  pantoprazole (PROTONIX) 40 MG tablet Take 40 mg by mouth daily.    [provider]  potassium chloride SA (KLOR-CON M) 20 MEQ tablet TAKE 2 TABLETS BY MOUTH DAILY FOR POTASSIUM 10/25/22   [provider]  pravastatin (PRAVACHOL) 20 MG tablet  02/06/21   [provider]  senna-docusate (SENOKOT-S) 8.6-50 MG tablet Take 1 tablet by mouth daily as needed for up to 30 doses for mild constipation. 12/15/22   Terald Sleeper, MD  tamsulosin (FLOMAX) 0.4 MG CAPS capsule Take 1 capsule by mouth daily. Patient not taking: Reported on 12/11/2022 08/21/22   [provider]     No family history on file.  Social History   Socioeconomic History   Marital status: Single    Spouse name: Not on file   Number of children: Not on file   Years of education: Not on file   Highest education level: Not on file  Occupational History   Not on file  Tobacco Use   Smoking status: Every Day    Current packs/day: 0.25    Average packs/day: 0.3 packs/day for 48.0 years (12.0 ttl pk-yrs)    Types: Cigarettes    Passive exposure: Current   Smokeless tobacco: Never  Vaping Use   Vaping status: Never Used  Substance and Sexual Activity   Alcohol use: Yes    Alcohol/week: 6.0  standard drinks of alcohol    Types: 6 Cans of beer per week   Drug use: No   Sexual activity: Not on file  Other Topics Concern   Not on file  Social History Narrative   Not on file   Social Determinants of Health   Financial Resource Strain: Low Risk  (11/27/2022)   Overall Financial Resource Strain (CARDIA)    Difficulty of Paying Living Expenses: Not very hard  Food Insecurity: Food Insecurity Present (11/27/2022)   Hunger Vital Sign    Worried About  Running Out of Food in the Last Year: Sometimes true    Ran Out of Food in the Last Year: Never true  Transportation Needs: No Transportation Needs (11/27/2022)   PRAPARE - Administrator, Civil Service (Medical): No    Lack of Transportation (Non-Medical): No  Physical Activity: Not on file  Stress: No Stress Concern Present (11/27/2022)   [employer] of Occupational Health - Occupational Stress Questionnaire    Feeling of Stress : Only a little  Social Connections: Not on file    ECOG Status: 0 - Asymptomatic  Review of Systems: A 12 point ROS discussed and pertinent positives are indicated in the HPI above.  All other systems are negative.  Review of Systems: Bladder outlet obstructin with Foley Catheter.  Mild back pain.  Vital Signs: There were no vitals taken for this visit.  Physical Exam No relevant findings  Imaging:     IMPRESSION: 1. Radiotracer avid mass at the base of the RIGHT seminal vesicle is consistent with extra prostatic prostate carcinoma. 2. Minimal activity within the prostate gland . Foley catheter in place 3. Multifocal sclerotic skeletal metastasis. The majority of the sclerotic metastasis have minimal radiotracer activity. Several discrete lesions have intense radiotracer activity. Activity is greater than liver moderately activity. Labs:  CBC: Recent Labs    11/23/22 1312 11/27/22 0949 12/15/22 1252  WBC 9.3 7.7 10.6*  HGB 11.1* 11.1* 10.1*  HCT 33.5* 34.6* 30.4*  PLT 415* 511* 623*    COAGS: No results for input(s): "INR", "APTT" in the last 8760 hours.  BMP: Recent Labs    11/23/22 1312 11/27/22 0949 12/15/22 1252  NA 129* 128* 127*  K 3.5 3.4* 3.2*  CL 92* 89* 86*  CO2 24 27 25   GLUCOSE 158* 180* 205*  BUN 9 12 7*  CALCIUM 9.3 9.4 9.3  CREATININE 0.79 1.03 0.77  GFRNONAA >60 >60 >60    LIVER FUNCTION TESTS: Recent Labs    11/27/22 0949 12/15/22 1531  BILITOT 0.8 1.4*  AST 18 25  ALT 11 7  ALKPHOS  96 92  PROT 9.1* 9.2*  ALBUMIN 3.8 3.3*    TUMOR MARKERS: PSA = 117  Assessment and Plan:  [Patient is adequate candidate Lu 177 PSMA therapy ( vipivotide tetraxetan).  Patient demonstrates mild-to-moderate progression metastatic [pelvic recurrence and skeletal disease] identified on recent PSMA PET scan.  Additionally patient's PSA is gradually increasing.  Patient has demonstrated progression on androgen deprivation and taxane chemotherapy.   Patient explained major and minor risks and benefits of therapy.  Major benefit being progression-free survival.  Major risk being myelosuppression and renal toxicity. Minor toxicity of xerostomia.  All the patient's questions were answered.  Patient accompanied by daughter who was also present for consult.    Patient is scheduled for 6 treatments spaced 6 weeks apart.  Recommend following up with oncologist for CBC and CMP 1 week prior to each treatment to  assess safety of continuing with therapy.      Thank you for this interesting consult.  I greatly enjoyed meeting Louis Gardner and look forward to participating in their care.  A copy of this report was sent to the requesting provider on this date.  Electronically Signed: Patriciaann Clan, MD 12/19/2022, 3:20 PM   I spent a total of  30 Minutes   in face to face in clinical consultation, greater than 50% of which was counseling/coordinating care for metastatic neuroendocrine tumor.

## 2022-12-19 NOTE — Progress Notes (Addendum)
ED Antimicrobial Stewardship Positive Culture Follow Up   Louis Gardner is an 62 y.o. male who presented to [facility] on 12/14/2022 with a chief complaint of nausea, vomiting, and a bloated abdomen.  Recent Results (from the past 720 hour(s))  Urine Culture     Status: Abnormal   Collection Time: 12/15/22 11:20 PM   Specimen: Urine, Catheterized  Result Value Ref Range Status   Specimen Description   Final    URINE, CATHETERIZED Performed at [facility], [address] [place], [place] [address]    Special Requests   Final    NONE Performed at [facility], [address], [place], [place] [address]    Culture >=100,000 COLONIES/mL STAPHYLOCOCCUS SAPROPHYTICUS (A)  Final   Report Status 12/18/2022 FINAL  Final   Organism ID, Bacteria STAPHYLOCOCCUS SAPROPHYTICUS (A)  Final      Susceptibility   Staphylococcus saprophyticus - MIC*    CIPROFLOXACIN <=0.5 SENSITIVE Sensitive     GENTAMICIN <=0.5 SENSITIVE Sensitive     NITROFURANTOIN <=16 SENSITIVE Sensitive     OXACILLIN 0.5 RESISTANT Resistant     TETRACYCLINE <=1 SENSITIVE Sensitive     VANCOMYCIN 1 SENSITIVE Sensitive     TRIMETH/SULFA 80 RESISTANT Resistant     CLINDAMYCIN RESISTANT Resistant     RIFAMPIN <=0.5 SENSITIVE Sensitive     Inducible Clindamycin POSITIVE Resistant     * >=100,000 COLONIES/mL STAPHYLOCOCCUS SAPROPHYTICUS   62 yo M who presented to ED on 12/14/2022 with a bloated abdomen, nausea, and vomiting. No urinary s/sx. Culture was taken from month old foley catheter. Foley catheter was exchanged 3 days ago. No need to treat asymptomatic bacteruria.   ED Provider: Claude Manges, PA  Caprice Beaver PharmD Candidate 2025 12/19/2022 9:07 AM

## 2022-12-23 ENCOUNTER — Telehealth: Payer: Self-pay

## 2022-12-23 DIAGNOSIS — C7951 Secondary malignant neoplasm of bone: Secondary | ICD-10-CM

## 2022-12-23 NOTE — Telephone Encounter (Signed)
-----   Message from Rickard Patience sent at 12/21/2022 10:19 AM EDT ----- Dr.Edmunds,  Thank you for evaluating him.  Once he is scheduled to start treatment, please let me know and I will arrange him to check cbc 1 week prior to each treatments.  Thank you  Janyth Contes ----- Message ----- From: Interface, Rad Results In Sent: 12/19/2022   2:41 PM EDT To: Rickard Patience, MD

## 2022-12-23 NOTE — Telephone Encounter (Signed)
Pt scheduled for Pluvicto on 9/19. Please contact pt to set lab (cbc) approx one week prior to 9/19.

## 2022-12-24 NOTE — Telephone Encounter (Signed)
Called pt to follow up. Spoke to Louis Gardner and informed her of the need for lab.   Please schedule lab on 9/12 @11am . She is aware of appt.

## 2022-12-25 ENCOUNTER — Encounter: Payer: Self-pay | Admitting: Physician Assistant

## 2022-12-25 ENCOUNTER — Ambulatory Visit: Payer: Medicaid Other | Admitting: Physician Assistant

## 2022-12-25 VITALS — BP 113/74 | HR 88 | Ht 69.0 in | Wt 159.0 lb

## 2022-12-25 DIAGNOSIS — R339 Retention of urine, unspecified: Secondary | ICD-10-CM

## 2022-12-25 NOTE — Progress Notes (Signed)
Patient presented to clinic today for urodynamics results.  This has not yet been performed, but he is scheduled with Dr. Alvester Morin on 8/26.  He was seen in the emergency room on 8/11 and Foley catheter was exchanged at that time.  Will reschedule his appointment for 3 to 4 weeks from now with Dr. Lonna Cobb to discuss urodynamics results and exchange his catheter.  He is in agreement with this plan.  Carman Ching, PA-C 12/25/22 9:27 AM

## 2023-01-01 ENCOUNTER — Other Ambulatory Visit: Payer: Self-pay | Admitting: Oncology

## 2023-01-01 DIAGNOSIS — C61 Malignant neoplasm of prostate: Secondary | ICD-10-CM

## 2023-01-07 ENCOUNTER — Emergency Department: Payer: Medicaid Other

## 2023-01-07 ENCOUNTER — Other Ambulatory Visit: Payer: Self-pay

## 2023-01-07 ENCOUNTER — Emergency Department
Admission: EM | Admit: 2023-01-07 | Discharge: 2023-01-07 | Disposition: A | Discharge status: Home / Self Care | Payer: Medicaid Other | Attending: Emergency Medicine | Admitting: Emergency Medicine

## 2023-01-07 ENCOUNTER — Encounter: Payer: Self-pay | Admitting: Emergency Medicine

## 2023-01-07 DIAGNOSIS — K8020 Calculus of gallbladder without cholecystitis without obstruction: Secondary | ICD-10-CM | POA: Diagnosis not present

## 2023-01-07 DIAGNOSIS — R079 Chest pain, unspecified: Secondary | ICD-10-CM | POA: Diagnosis not present

## 2023-01-07 DIAGNOSIS — R1010 Upper abdominal pain, unspecified: Secondary | ICD-10-CM | POA: Diagnosis present

## 2023-01-07 DIAGNOSIS — Z8507 Personal history of malignant neoplasm of pancreas: Secondary | ICD-10-CM | POA: Insufficient documentation

## 2023-01-07 DIAGNOSIS — Z8546 Personal history of malignant neoplasm of prostate: Secondary | ICD-10-CM | POA: Diagnosis not present

## 2023-01-07 LAB — BASIC METABOLIC PANEL
Anion gap: 14 (ref 5–15)
BUN: 9 mg/dL (ref 8–23)
CO2: 30 mmol/L (ref 22–32)
Calcium: 9.5 mg/dL (ref 8.9–10.3)
Chloride: 84 mmol/L — ABNORMAL LOW (ref 98–111)
Creatinine, Ser: 0.76 mg/dL (ref 0.61–1.24)
GFR, Estimated: >60 mL/min (ref >60)
Glucose, Bld: 162 mg/dL — ABNORMAL HIGH (ref 70–99)
Potassium: 3.1 mmol/L — ABNORMAL LOW (ref 3.5–5.1)
Sodium: 128 mmol/L — ABNORMAL LOW (ref 135–145)

## 2023-01-07 LAB — HEPATIC FUNCTION PANEL
ALT: 8 U/L (ref 0–44)
AST: 26 U/L (ref 15–41)
Albumin: 3.1 g/dL — ABNORMAL LOW (ref 3.5–5.0)
Alkaline Phosphatase: 120 U/L (ref 38–126)
Bilirubin, Direct: 0.2 mg/dL (ref 0.0–0.2)
Indirect Bilirubin: 0.3 mg/dL (ref 0.3–0.9)
Total Bilirubin: 0.5 mg/dL (ref 0.3–1.2)
Total Protein: 8.6 g/dL — ABNORMAL HIGH (ref 6.5–8.1)

## 2023-01-07 LAB — CBC
HCT: 28.1 % — ABNORMAL LOW (ref 39.0–52.0)
Hemoglobin: 9.2 g/dL — ABNORMAL LOW (ref 13.0–17.0)
MCH: 23.7 pg — ABNORMAL LOW (ref 26.0–34.0)
MCHC: 32.7 g/dL (ref 30.0–36.0)
MCV: 72.2 fL — ABNORMAL LOW (ref 80.0–100.0)
Platelets: 517 10*3/uL — ABNORMAL HIGH (ref 150–400)
RBC: 3.89 MIL/uL — ABNORMAL LOW (ref 4.22–5.81)
RDW: 16.6 % — ABNORMAL HIGH (ref 11.5–15.5)
WBC: 11 10*3/uL — ABNORMAL HIGH (ref 4.0–10.5)
nRBC: 0 % (ref 0.0–0.2)

## 2023-01-07 LAB — TROPONIN I (HIGH SENSITIVITY)
Troponin I (High Sensitivity): 11 ng/L (ref <18)
Troponin I (High Sensitivity): 10 ng/L (ref <18)

## 2023-01-07 LAB — LIPASE, BLOOD: Lipase: 27 U/L (ref 11–51)

## 2023-01-07 MED ORDER — ACETAMINOPHEN 325 MG PO TABS
650.0000 mg | ORAL_TABLET | Freq: Once | ORAL | Status: AC
  Administered 2023-01-07: 650 mg via ORAL
  Filled 2023-01-07: qty 2

## 2023-01-07 MED ORDER — ONDANSETRON HCL 4 MG/2ML IJ SOLN
4.0000 mg | Freq: Once | INTRAMUSCULAR | Status: AC
  Administered 2023-01-07: 4 mg via INTRAVENOUS
  Filled 2023-01-07: qty 2

## 2023-01-07 MED ORDER — PANTOPRAZOLE SODIUM 40 MG PO TBEC: 40.0000 mg | DELAYED_RELEASE_TABLET | Freq: Every day | ORAL | 2 refills | Status: DC

## 2023-01-07 MED ORDER — POTASSIUM CHLORIDE CRYS ER 20 MEQ PO TBCR
40.0000 meq | EXTENDED_RELEASE_TABLET | Freq: Once | ORAL | Status: AC
  Administered 2023-01-07: 40 meq via ORAL
  Filled 2023-01-07: qty 2

## 2023-01-07 MED ORDER — ONDANSETRON HCL 4 MG PO TABS: 4.0000 mg | ORAL_TABLET | Freq: Every day | ORAL | 1 refills | Status: DC | PRN

## 2023-01-07 MED ORDER — SODIUM CHLORIDE 0.9 % IV BOLUS
500.0000 mL | Freq: Once | INTRAVENOUS | Status: AC
  Administered 2023-01-07: 500 mL via INTRAVENOUS

## 2023-01-07 MED ORDER — IOHEXOL 350 MG/ML SOLN
100.0000 mL | Freq: Once | INTRAVENOUS | Status: AC | PRN
  Administered 2023-01-07: 100 mL via INTRAVENOUS

## 2023-01-07 MED ORDER — KETOROLAC TROMETHAMINE 15 MG/ML IJ SOLN
15.0000 mg | Freq: Once | INTRAMUSCULAR | Status: AC
  Administered 2023-01-07: 15 mg via INTRAVENOUS
  Filled 2023-01-07: qty 1

## 2023-01-07 MED ORDER — ASPIRIN 81 MG PO CHEW
324.0000 mg | CHEWABLE_TABLET | Freq: Once | ORAL | Status: AC
  Administered 2023-01-07: 324 mg via ORAL
  Filled 2023-01-07: qty 4

## 2023-01-07 MED ORDER — OXYCODONE HCL 5 MG PO TABS: 5.0000 mg | ORAL_TABLET | Freq: Three times a day (TID) | ORAL | 0 refills | Status: DC | PRN

## 2023-01-07 MED ORDER — OXYCODONE HCL 5 MG PO TABS
5.0000 mg | ORAL_TABLET | Freq: Once | ORAL | Status: AC | PRN
  Administered 2023-01-07: 5 mg via ORAL
  Filled 2023-01-07: qty 1

## 2023-01-07 NOTE — Discharge Instructions (Addendum)
Your testing in the emergency department did not reveal any emergencies to explain your chest and upper abdominal pain with nausea and vomiting.  However there were gallstones in your gallbladder that may account for your symptoms.  There was no infection of the gallbladder, fortunately, so you do not need an emergency surgery today.  It would be prudent for you to talk to Dr. Everlene Farrier of general surgery to discuss an elective surgery to remove your gallbladder at a later date since you may experience recurrence of this pain.  Call their office today to schedule an appointment.  Take acetaminophen 650 mg and ibuprofen 400 mg every 6 hours for pain.  Take with food. Take oxycodone as prescribed for severe/breakthrough pain.  Take Zofran for nausea as needed.  Thank you for choosing [place] for your health care today!  Please see your primary doctor this week for a follow up appointment.   If you have any new, worsening, or unexpected symptoms call your doctor right away or come back to the emergency department for reevaluation.  It was my pleasure to care for you today.   Daneil Dan Modesto Charon, MD

## 2023-01-07 NOTE — ED Provider Notes (Signed)
[facility] Provider Note    Event Date/Time   First MD Initiated Contact with Patient 01/07/23 [id number]     (approximate)   History   Chest Pain   HPI  Louis Gardner is a 62 y.o. 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  It occurred while he was making a bowel movement, not straining, and associated with nausea and vomiting.    No shortness of breath, cough, fever.  No GI or GU symptoms.  He tried to rest it off last night had trouble sleeping due to the ongoing nausea and upper abdominal/chest pain, constant unchanging through this morning after he decided to come to emergency department.  External Medical Documents Reviewed: Oncology note from July 2024 documenting prostatic cancer history and previous chemotherapy and androgen suppression therapies      Physical Exam   Triage Vital Signs: ED Triage Vitals  Encounter Vitals Group     BP      Systolic BP Percentile      Diastolic BP Percentile      Pulse      Resp      Temp      Temp src      SpO2      Weight      Height      Head Circumference      Peak Flow      Pain Score      Pain Loc      Pain Education      Exclude from Growth Chart     Most recent vital signs: Vitals:   01/07/23 0800 01/07/23 0924  BP: 112/66 120/84  Pulse: 83 90  Resp: 11 16  Temp:    SpO2: 100% 98%    General: Awake, no distress.  CV:  Good peripheral perfusion.  Resp:  Normal effort.  Abd:  No distention.  Other:  Clear lungs without focality or wheezing, appears euvolemic, normal vital signs and afebrile, no respiratory distress, has some upper abdominal tenderness to palpation across the epigastrium and right upper and left upper quadrants.  He is dry heaving occasionally.   ED Results /  Procedures / Treatments   Labs (all labs ordered are listed, but only abnormal results are displayed) Labs Reviewed  CBC - Abnormal; Notable for the following components:      Result Value   WBC 11.0 (*)    RBC 3.89 (*)    Hemoglobin 9.2 (*)    HCT 28.1 (*)    MCV 72.2 (*)    MCH 23.7 (*)    RDW 16.6 (*)    Platelets 517 (*)    All other components within normal limits  BASIC METABOLIC PANEL - Abnormal; Notable for the following components:   Sodium 128 (*)    Potassium 3.1 (*)    Chloride 84 (*)    Glucose, Bld 162 (*)    All other components within normal limits  HEPATIC FUNCTION PANEL - Abnormal; Notable for the following components:   Total Protein 8.6 (*)    Albumin 3.1 (*)    All other components within normal limits  LIPASE, BLOOD  TROPONIN I (HIGH SENSITIVITY)  TROPONIN I (HIGH SENSITIVITY)     I ordered and reviewed the above labs they are notable for some mild leukocytosis  11, normal LFTs and lipase  EKG  ED ECG REPORT I, Pilar Jarvis, the attending physician, personally viewed and interpreted this ECG.   Date: 01/07/2023  EKG Time: 0703  Rate: 82  Rhythm: sinus  Axis: nl  Intervals: none  ST&T Change: no stemi, +pac's    RADIOLOGY I independently reviewed and interpreted chest x-ray and I see no obvious focality or pneumothorax I also reviewed radiologist's formal read.   PROCEDURES:  Critical Care performed: No  Procedures   MEDICATIONS ORDERED IN ED: Medications  ketorolac (TORADOL) 15 MG/ML injection 15 mg (has no administration in time range)  acetaminophen (TYLENOL) tablet 650 mg (has no administration in time range)  oxyCODONE (Oxy IR/ROXICODONE) immediate release tablet 5 mg (has no administration in time range)  aspirin chewable tablet 324 mg (324 mg Oral Given 01/07/23 0726)  ondansetron (ZOFRAN) injection 4 mg (4 mg Intravenous Given 01/07/23 0732)  sodium chloride 0.9 % bolus 500 mL (0 mLs Intravenous Stopped 01/07/23 0920)  iohexol  (OMNIPAQUE) 350 MG/ML injection 100 mL (100 mLs Intravenous Contrast Given 01/07/23 0821)  potassium chloride SA (KLOR-CON M) CR tablet 40 mEq (40 mEq Oral Given 01/07/23 0934)    IMPRESSION / MDM / ASSESSMENT AND PLAN / ED COURSE  I reviewed the triage vital signs and the nursing notes.                                Patient's presentation is most consistent with acute presentation with potential threat to life or bodily function.  Differential diagnosis includes, but is not limited to, ACS, PE, dissection, gastritis/dyspepsia, ulcer, cholecystitis, biliary pathology, pancreatitis, intra-abdominal infection like appendicitis, obstruction   The patient is on the cardiac monitor to evaluate for evidence of arrhythmia and/or significant heart rate changes.  MDM:    In terms of his chest pain, atypical for ACS PE or dissection.  However given her age and cardiac risk factors, check ACS with EKG which fortunately looks nonischemic and follow-up with serial troponins.  Consider PE given pleuritic nature of pain, cancer patient, high risk, check CT angiogram.  Upper abdominal component with nausea and vomiting consider obstruction, biliary pathologies, pancreatitis, check labs including LFTs and lipase as well as a CT scan to be brought down through the abdomen pelvis.  Treat with antiemetic, ASA, small fluid bolus given vomiting.   ---  Patient stable, testing unremarkable for emergent findings though shows gallstones without cholecystitis that account for his pain.  I concur that I doubt cholecystitis at this time given no fever, nontoxic appearance, with just mild leukocytosis 11.0 and no LFT abnormalities.  He will follow-up with general surgery as an outpatient for elective procedure.      FINAL CLINICAL IMPRESSION(S) / ED DIAGNOSES   Final diagnoses:  Nonspecific chest pain  Gallstones     Rx / DC Orders   ED Discharge Orders          Ordered    Ambulatory referral to  Cardiology  Status:  Canceled       Comments: If you have not heard from the Cardiology office within the next 72 hours please call [phone].   01/07/23 0914    ondansetron (ZOFRAN) 4 MG tablet  Daily PRN        01/07/23 0929    pantoprazole (PROTONIX) 40 MG tablet  Daily        01/07/23 0929    oxyCODONE (ROXICODONE) 5  MG immediate release tablet  Every 8 hours PRN        01/07/23 0935             Note:  This document was prepared using Dragon voice recognition software and may include unintentional dictation errors.    Pilar Jarvis, MD 01/07/23 [phone]

## 2023-01-07 NOTE — ED Notes (Signed)
Patient transported to CT 

## 2023-01-07 NOTE — ED Triage Notes (Signed)
Patient ambulatory to triage with steady gait, without difficulty or distress noted; pt reports since last night having left sided CP, nonradiating accomp by nausea; denies hx of same

## 2023-01-10 ENCOUNTER — Other Ambulatory Visit: Payer: Self-pay

## 2023-01-10 ENCOUNTER — Encounter (HOSPITAL_COMMUNITY): Payer: Self-pay | Admitting: Emergency Medicine

## 2023-01-10 ENCOUNTER — Emergency Department (HOSPITAL_COMMUNITY)
Admission: EM | Admit: 2023-01-10 | Discharge: 2023-01-10 | Disposition: A | Discharge status: Home / Self Care | Payer: Medicaid Other | Attending: Emergency Medicine | Admitting: Emergency Medicine

## 2023-01-10 DIAGNOSIS — R112 Nausea with vomiting, unspecified: Secondary | ICD-10-CM | POA: Insufficient documentation

## 2023-01-10 DIAGNOSIS — R1012 Left upper quadrant pain: Secondary | ICD-10-CM | POA: Insufficient documentation

## 2023-01-10 DIAGNOSIS — E871 Hypo-osmolality and hyponatremia: Secondary | ICD-10-CM | POA: Diagnosis not present

## 2023-01-10 DIAGNOSIS — Z8546 Personal history of malignant neoplasm of prostate: Secondary | ICD-10-CM | POA: Insufficient documentation

## 2023-01-10 LAB — CBC WITH DIFFERENTIAL/PLATELET
Abs Immature Granulocytes: 0.18 10*3/uL — ABNORMAL HIGH (ref 0.00–0.07)
Basophils Absolute: 0 10*3/uL (ref 0.0–0.1)
Basophils Relative: 0 %
Eosinophils Absolute: 0 10*3/uL (ref 0.0–0.5)
Eosinophils Relative: 0 %
HCT: 26.6 % — ABNORMAL LOW (ref 39.0–52.0)
Hemoglobin: 8.7 g/dL — ABNORMAL LOW (ref 13.0–17.0)
Immature Granulocytes: 1 %
Lymphocytes Relative: 10 %
Lymphs Abs: 1.3 10*3/uL (ref 0.7–4.0)
MCH: 24.3 pg — ABNORMAL LOW (ref 26.0–34.0)
MCHC: 32.7 g/dL (ref 30.0–36.0)
MCV: 74.3 fL — ABNORMAL LOW (ref 80.0–100.0)
Monocytes Absolute: 1.6 10*3/uL — ABNORMAL HIGH (ref 0.1–1.0)
Monocytes Relative: 12 %
Neutro Abs: 10.1 10*3/uL — ABNORMAL HIGH (ref 1.7–7.7)
Neutrophils Relative %: 77 %
Platelets: 477 10*3/uL — ABNORMAL HIGH (ref 150–400)
RBC: 3.58 MIL/uL — ABNORMAL LOW (ref 4.22–5.81)
RDW: 16.7 % — ABNORMAL HIGH (ref 11.5–15.5)
WBC: 13.3 10*3/uL — ABNORMAL HIGH (ref 4.0–10.5)
nRBC: 0 % (ref 0.0–0.2)

## 2023-01-10 LAB — COMPREHENSIVE METABOLIC PANEL
ALT: 9 U/L (ref 0–44)
AST: 16 U/L (ref 15–41)
Albumin: 2.6 g/dL — ABNORMAL LOW (ref 3.5–5.0)
Alkaline Phosphatase: 118 U/L (ref 38–126)
Anion gap: 13 (ref 5–15)
BUN: 11 mg/dL (ref 8–23)
CO2: 28 mmol/L (ref 22–32)
Calcium: 9 mg/dL (ref 8.9–10.3)
Chloride: 85 mmol/L — ABNORMAL LOW (ref 98–111)
Creatinine, Ser: 0.97 mg/dL (ref 0.61–1.24)
GFR, Estimated: >60 mL/min (ref >60)
Glucose, Bld: 231 mg/dL — ABNORMAL HIGH (ref 70–99)
Potassium: 3.4 mmol/L — ABNORMAL LOW (ref 3.5–5.1)
Sodium: 126 mmol/L — ABNORMAL LOW (ref 135–145)
Total Bilirubin: 0.9 mg/dL (ref 0.3–1.2)
Total Protein: 7.8 g/dL (ref 6.5–8.1)

## 2023-01-10 MED ORDER — LACTATED RINGERS IV BOLUS
1000.0000 mL | Freq: Once | INTRAVENOUS | Status: AC
  Administered 2023-01-10: 1000 mL via INTRAVENOUS

## 2023-01-10 MED ORDER — ONDANSETRON 8 MG PO TBDP: 8.0000 mg | ORAL_TABLET | Freq: Three times a day (TID) | ORAL | 0 refills | Status: DC | PRN

## 2023-01-10 MED ORDER — OXYCODONE-ACETAMINOPHEN 5-325 MG PO TABS
1.0000 | ORAL_TABLET | Freq: Once | ORAL | Status: AC
  Administered 2023-01-10: 1 via ORAL
  Filled 2023-01-10: qty 1

## 2023-01-10 MED ORDER — ONDANSETRON 8 MG PO TBDP
8.0000 mg | ORAL_TABLET | Freq: Once | ORAL | Status: AC
  Administered 2023-01-10: 8 mg via ORAL
  Filled 2023-01-10: qty 1

## 2023-01-10 NOTE — ED Provider Notes (Signed)
[place] EMERGENCY DEPARTMENT AT [facility] Provider Note   CSN: [id number] Arrival date & time: 01/10/23  2952     History  Chief Complaint  Patient presents with   Emesis    Louis Gardner is a 62 y.o. male.  HPI    62 year old male comes in with chief complaint of vomiting and abdominal pain. Patient has known history of prostate cancer with metastases to the bone.  He states that he has been feeling unwell for the last several days.  He has abdominal pain in the upper quadrant and nausea.  He was seen in the ER recently and the discharged with nausea medicine.  He comes in because he continues to have couple of episodes of emesis in a day.  Nausea is intermittent.  His p.o. intake has gone down.  He has abdominal pain in the upper quadrant on the left side.  Home Medications Prior to Admission medications   Medication Sig Start Date End Date Taking? Authorizing Provider  ondansetron (ZOFRAN-ODT) 8 MG disintegrating tablet Take 1 tablet (8 mg total) by mouth every 8 (eight) hours as needed for nausea. 01/10/23  Yes Jamauri Kruzel, MD  amLODipine (NORVASC) 10 MG tablet Take 10 mg by mouth daily. 04/19/21   [provider]  bisacodyl (DULCOLAX) 10 MG suppository Place 1 suppository (10 mg total) rectally daily as needed for up to 12 doses for moderate constipation. 12/15/22   Terald Sleeper, MD  GAVILYTE-G 236 g solution Take 4,000 mLs by mouth as directed. 12/10/22   [provider]  metFORMIN (GLUCOPHAGE) 500 MG tablet Take by mouth. 09/18/22   [provider]  oxyCODONE (ROXICODONE) 5 MG immediate release tablet Take 1 tablet (5 mg total) by mouth every 8 (eight) hours as needed for up to 16 doses for breakthrough pain or severe pain. 01/07/23   Pilar Jarvis, MD  pantoprazole (PROTONIX) 40 MG tablet Take 1 tablet (40 mg total) by mouth daily. 01/07/23 04/07/23  Pilar Jarvis, MD  potassium chloride SA (KLOR-CON M) 20 MEQ tablet TAKE 2 TABLETS BY  MOUTH DAILY FOR POTASSIUM 10/25/22   [provider]  pravastatin (PRAVACHOL) 20 MG tablet  02/06/21   [provider]  senna-docusate (SENOKOT-S) 8.6-50 MG tablet Take 1 tablet by mouth daily as needed for up to 30 doses for mild constipation. 12/15/22   Terald Sleeper, MD  tamsulosin (FLOMAX) 0.4 MG CAPS capsule Take 1 capsule by mouth daily. 08/21/22   [provider]      Allergies    Patient has no known allergies.    Review of Systems   Review of Systems  All other systems reviewed and are negative.   Physical Exam Updated Vital Signs BP 99/72 (BP Location: Left Arm)   Pulse 77   Temp 97.9 F (36.6 C) (Oral)   Resp 12   SpO2 99%  Physical Exam Vitals and nursing note reviewed.  Constitutional:      Appearance: He is well-developed.  HENT:     Head: Atraumatic.  Cardiovascular:     Rate and Rhythm: Normal rate.  Pulmonary:     Effort: Pulmonary effort is normal.  Abdominal:     Tenderness: There is abdominal tenderness.     Comments: Patient has left upper quadrant tenderness without peritoneal findings.  No left flank tenderness.  No hernia.  Musculoskeletal:     Cervical back: Neck supple.  Skin:    General: Skin is warm.  Neurological:  Mental Status: He is alert and oriented to person, place, and time.     ED Results / Procedures / Treatments   Labs (all labs ordered are listed, but only abnormal results are displayed) Labs Reviewed  COMPREHENSIVE METABOLIC PANEL - Abnormal; Notable for the following components:      Result Value   Sodium 126 (*)    Potassium 3.4 (*)    Chloride 85 (*)    Glucose, Bld 231 (*)    Albumin 2.6 (*)    All other components within normal limits  CBC WITH DIFFERENTIAL/PLATELET - Abnormal; Notable for the following components:   WBC 13.3 (*)    RBC 3.58 (*)    Hemoglobin 8.7 (*)    HCT 26.6 (*)    MCV 74.3 (*)    MCH 24.3 (*)    RDW 16.7 (*)    Platelets 477 (*)    Neutro Abs 10.1 (*)     Monocytes Absolute 1.6 (*)    Abs Immature Granulocytes 0.18 (*)    All other components within normal limits    EKG None  Radiology No results found.  Procedures Procedures    Medications Ordered in ED Medications  lactated ringers bolus 1,000 mL (0 mLs Intravenous Stopped 01/10/23 1243)  ondansetron (ZOFRAN-ODT) disintegrating tablet 8 mg (8 mg Oral Given 01/10/23 1036)  oxyCODONE-acetaminophen (PERCOCET/ROXICET) 5-325 MG per tablet 1 tablet (1 tablet Oral Given 01/10/23 1036)    ED Course/ Medical Decision Making/ A&P Clinical Course as of 01/10/23 1315  Fri Jan 10, 2023  1314 Pt reassessed. Pt's VSS and WNL. Pt's cap refill < 3 seconds. Pt has been hydrated in the ER and now passed po challenge.  No emesis in the ER.  Patient indicates that the nausea medicine did help him.  We will discharge with antiemetic. Strict ER return precautions have been discussed and pt will return if he is unable to tolerate fluids and symptoms are getting worse.  Particularly, we did discuss with him that his sodium is again found to be low.  He will need to get the sodium rechecked.  He will need to return to the ER if he starts having tractable nausea, vomiting, muscle aches, memory issues, seizure-like activity, confusion.  Family asks if patient can take Ensure -which I strongly encouraged.  [AN]    Clinical Course User Index [AN] Derwood Kaplan, MD                                 Medical Decision Making Amount and/or Complexity of Data Reviewed Labs: ordered.  Risk Prescription drug management.  This patient presents to the ED with chief complaint(s) of abdominal pain, nausea with intermittent emesis pertinent past medical history of metastatic prostate cancer.The complaint involves an extensive differential diagnosis and also carries with it a high risk of complications and morbidity.    Differential diagnosis considered for this patient includes dehydration, renal failure, severe  electrolyte abnormality, worsening cancer, UTI, splenomegaly  Patient was just seen in the ER few days back.  He had CT abdomen pelvis with contrast at that time.  CT revealed progression of his cancer, especially in the chest. He also was found to have low sodium.  However the sodium has been low since at least the last 3 weeks.  The initial plan is to get basic labs and to ensure that patient does not have worsening hyponatremia. If sodium is stable  or slightly lower, then we will initiate p.o. challenge.  Based on the exam today, CT scan not indicated.   Additional history obtained: Additional history obtained from family Records reviewed previous admission documents and previous CT scan from few days back.  We will not need to repeat CT scan because of it.  Independent labs interpretation:  The following labs were independently interpreted: Patient's sodium today is 126.  It was 128 few days back and patient's sodium has stayed in the 127-132 range in the last year. I think we will start p.o. challenge, as I do not think his primary issue is acute hyponatremia.   Treatment and Reassessment: P.o. challenge initiated.  Results of the ED workup discussed with the patient.   Consideration for admission or further workup: CT was considered, admission was considered, however patient has passed oral challenge.    Final Clinical Impression(s) / ED Diagnoses Final diagnoses:  Chronic hyponatremia  Left upper quadrant abdominal pain  Nausea and vomiting, unspecified vomiting type    Rx / DC Orders ED Discharge Orders          Ordered    ondansetron (ZOFRAN-ODT) 8 MG disintegrating tablet  Every 8 hours PRN        01/10/23 1312              Derwood Kaplan, MD 01/10/23 1320

## 2023-01-10 NOTE — Discharge Instructions (Addendum)
You are seen in the emergency room for abdominal pain, nausea. Our suspicion is that your symptoms are because of worsening cancer.  We did discover that your sodium was low, it was low last time you were seen in the ER few days back as well.  You will need to get your sodium rechecked in 1 week.  This can be done by the cancer doctor as well.  For your nausea, start taking 8 mg Zofran instead of the 4 mg that you were prescribed.  Please return to the ER if you start having balance issues, confusion, seizure-like activity, worsening nausea and vomiting, muscle spasms.

## 2023-01-10 NOTE — ED Triage Notes (Signed)
Pt reports emesis x 2 weeks. Pt reports he was recently dx with gallstones. Denies fevers.

## 2023-01-13 ENCOUNTER — Other Ambulatory Visit: Payer: Self-pay | Admitting: Oncology

## 2023-01-13 DIAGNOSIS — D508 Other iron deficiency anemias: Secondary | ICD-10-CM

## 2023-01-13 DIAGNOSIS — D649 Anemia, unspecified: Secondary | ICD-10-CM | POA: Insufficient documentation

## 2023-01-14 ENCOUNTER — Telehealth: Payer: Self-pay

## 2023-01-14 ENCOUNTER — Telehealth: Payer: Self-pay | Admitting: *Deleted

## 2023-01-14 ENCOUNTER — Other Ambulatory Visit: Payer: Self-pay

## 2023-01-14 DIAGNOSIS — C61 Malignant neoplasm of prostate: Secondary | ICD-10-CM

## 2023-01-14 DIAGNOSIS — D508 Other iron deficiency anemias: Secondary | ICD-10-CM

## 2023-01-14 NOTE — Telephone Encounter (Addendum)
Spoken to Philomena Doheny (on DPR) and Patient will need to reschedule due health issues.

## 2023-01-14 NOTE — Telephone Encounter (Signed)
Pt schedule for labs tomorrow, please schedule MD / venofer 1-2 days after lab and inform pt of appt details.

## 2023-01-14 NOTE — Telephone Encounter (Signed)
Pt requested call back to reschedule colonoscopy

## 2023-01-14 NOTE — Telephone Encounter (Signed)
Spoken to Philomena Doheny (on DPR) and Patient will need to reschedule due health issues.   Requesting to change to 02/20/2023.  New instructions will be sent.

## 2023-01-14 NOTE — Telephone Encounter (Signed)
-----   Message from Rickard Patience sent at 01/13/2023 10:36 PM EDT ----- Please arrange him to do a follow up asap, lab prior to MD +/- Venofer Check cbc cmp iron tibc ferritin hold tube

## 2023-01-14 NOTE — Telephone Encounter (Signed)
I did confirm the address like before and send separate 3 copies.

## 2023-01-15 ENCOUNTER — Other Ambulatory Visit: Payer: Self-pay

## 2023-01-15 ENCOUNTER — Telehealth: Payer: Self-pay | Admitting: *Deleted

## 2023-01-15 ENCOUNTER — Encounter: Payer: Self-pay | Admitting: Hospice and Palliative Medicine

## 2023-01-15 ENCOUNTER — Ambulatory Visit
Admission: RE | Admit: 2023-01-15 | Discharge: 2023-01-15 | Disposition: A | Discharge status: Home / Self Care | Payer: Medicaid Other | Source: Ambulatory Visit | Attending: Hospice and Palliative Medicine

## 2023-01-15 ENCOUNTER — Ambulatory Visit
Admission: RE | Admit: 2023-01-15 | Discharge: 2023-01-15 | Disposition: A | Discharge status: Home / Self Care | Payer: Medicaid Other | Attending: Hospice and Palliative Medicine | Admitting: Hospice and Palliative Medicine

## 2023-01-15 ENCOUNTER — Inpatient Hospital Stay (HOSPITAL_BASED_OUTPATIENT_CLINIC_OR_DEPARTMENT_OTHER): Payer: Medicaid Other | Admitting: Hospice and Palliative Medicine

## 2023-01-15 ENCOUNTER — Inpatient Hospital Stay: Payer: Medicaid Other | Attending: Oncology

## 2023-01-15 VITALS — BP 146/73 | HR 92 | Temp 97.9°F | Resp 18

## 2023-01-15 DIAGNOSIS — C7951 Secondary malignant neoplasm of bone: Secondary | ICD-10-CM

## 2023-01-15 DIAGNOSIS — K5900 Constipation, unspecified: Secondary | ICD-10-CM | POA: Insufficient documentation

## 2023-01-15 DIAGNOSIS — K5730 Diverticulosis of large intestine without perforation or abscess without bleeding: Secondary | ICD-10-CM | POA: Insufficient documentation

## 2023-01-15 DIAGNOSIS — D508 Other iron deficiency anemias: Secondary | ICD-10-CM

## 2023-01-15 DIAGNOSIS — K439 Ventral hernia without obstruction or gangrene: Secondary | ICD-10-CM | POA: Diagnosis not present

## 2023-01-15 DIAGNOSIS — R0789 Other chest pain: Secondary | ICD-10-CM | POA: Diagnosis not present

## 2023-01-15 DIAGNOSIS — C787 Secondary malignant neoplasm of liver and intrahepatic bile duct: Secondary | ICD-10-CM | POA: Diagnosis not present

## 2023-01-15 DIAGNOSIS — C61 Malignant neoplasm of prostate: Secondary | ICD-10-CM

## 2023-01-15 DIAGNOSIS — Z79899 Other long term (current) drug therapy: Secondary | ICD-10-CM | POA: Insufficient documentation

## 2023-01-15 DIAGNOSIS — K862 Cyst of pancreas: Secondary | ICD-10-CM | POA: Diagnosis not present

## 2023-01-15 DIAGNOSIS — I700 Atherosclerosis of aorta: Secondary | ICD-10-CM | POA: Diagnosis not present

## 2023-01-15 DIAGNOSIS — K8020 Calculus of gallbladder without cholecystitis without obstruction: Secondary | ICD-10-CM | POA: Insufficient documentation

## 2023-01-15 DIAGNOSIS — N62 Hypertrophy of breast: Secondary | ICD-10-CM | POA: Diagnosis not present

## 2023-01-15 LAB — IRON AND TIBC
Iron: 30 ug/dL — ABNORMAL LOW (ref 45–182)
Saturation Ratios: 17 % — ABNORMAL LOW (ref 17.9–39.5)
TIBC: 174 ug/dL — ABNORMAL LOW (ref 250–450)
UIBC: 144 ug/dL

## 2023-01-15 LAB — VITAMIN B12: Vitamin B-12: 593 pg/mL (ref 180–914)

## 2023-01-15 LAB — CBC WITH DIFFERENTIAL (CANCER CENTER ONLY)
Abs Immature Granulocytes: 0.21 10*3/uL — ABNORMAL HIGH (ref 0.00–0.07)
Basophils Absolute: 0 10*3/uL (ref 0.0–0.1)
Basophils Relative: 0 %
Eosinophils Absolute: 0 10*3/uL (ref 0.0–0.5)
Eosinophils Relative: 0 %
HCT: 27.3 % — ABNORMAL LOW (ref 39.0–52.0)
Hemoglobin: 8.9 g/dL — ABNORMAL LOW (ref 13.0–17.0)
Immature Granulocytes: 2 %
Lymphocytes Relative: 16 %
Lymphs Abs: 1.7 10*3/uL (ref 0.7–4.0)
MCH: 23.9 pg — ABNORMAL LOW (ref 26.0–34.0)
MCHC: 32.6 g/dL (ref 30.0–36.0)
MCV: 73.4 fL — ABNORMAL LOW (ref 80.0–100.0)
Monocytes Absolute: 1 10*3/uL (ref 0.1–1.0)
Monocytes Relative: 9 %
Neutro Abs: 7.7 10*3/uL (ref 1.7–7.7)
Neutrophils Relative %: 73 %
Platelet Count: 645 10*3/uL — ABNORMAL HIGH (ref 150–400)
RBC: 3.72 MIL/uL — ABNORMAL LOW (ref 4.22–5.81)
RDW: 16.7 % — ABNORMAL HIGH (ref 11.5–15.5)
WBC Count: 10.7 10*3/uL — ABNORMAL HIGH (ref 4.0–10.5)
nRBC: 0.2 % (ref 0.0–0.2)

## 2023-01-15 LAB — CMP (CANCER CENTER ONLY)
ALT: 8 U/L (ref 0–44)
AST: 21 U/L (ref 15–41)
Albumin: 3 g/dL — ABNORMAL LOW (ref 3.5–5.0)
Alkaline Phosphatase: 105 U/L (ref 38–126)
Anion gap: 11 (ref 5–15)
BUN: 8 mg/dL (ref 8–23)
CO2: 28 mmol/L (ref 22–32)
Calcium: 9.1 mg/dL (ref 8.9–10.3)
Chloride: 85 mmol/L — ABNORMAL LOW (ref 98–111)
Creatinine: 0.77 mg/dL (ref 0.61–1.24)
GFR, Estimated: >60 mL/min (ref >60)
Glucose, Bld: 216 mg/dL — ABNORMAL HIGH (ref 70–99)
Potassium: 3.4 mmol/L — ABNORMAL LOW (ref 3.5–5.1)
Sodium: 124 mmol/L — ABNORMAL LOW (ref 135–145)
Total Bilirubin: 0.7 mg/dL (ref 0.3–1.2)
Total Protein: 8.4 g/dL — ABNORMAL HIGH (ref 6.5–8.1)

## 2023-01-15 LAB — SAMPLE TO BLOOD BANK

## 2023-01-15 LAB — FOLATE: Folate: 10.4 ng/mL (ref >5.9)

## 2023-01-15 LAB — PSA: Prostatic Specific Antigen: 326.34 ng/mL — ABNORMAL HIGH (ref 0.00–4.00)

## 2023-01-15 LAB — ABO/RH: ABO/RH(D): O POS

## 2023-01-15 LAB — FERRITIN: Ferritin: 1767 ng/mL — ABNORMAL HIGH (ref 24–336)

## 2023-01-15 MED ORDER — LACTULOSE 10 GM/15ML PO SOLN: 10.0000 g | Freq: Two times a day (BID) | ORAL | 0 refills | Status: DC | PRN

## 2023-01-15 NOTE — Progress Notes (Signed)
Symptom Management Clinic [facility] at [facility] [phone] [phone]  Patient Care Team: [facility] as PCP - General Debbe Odea, MD as PCP - Cardiology (Cardiology)   NAME OF PATIENT: Louis Gardner  [id number]  03-29-1961   DATE OF VISIT: 01/15/23  REASON FOR CONSULT: ABHISHEK HUNSICKER is a 62 y.o. 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  Patient is being evaluated for Pluvicto.  INTERVAL HISTORY: Patient was an add-on to my clinic schedule today for evaluation of constipation.  He reports last bowel movement 4 days ago.  He is subsequently felt bloated, passing little gas, abdominal tenderness, and occasional nausea and vomiting.  He has been taking a bowel regimen of milk of mag, senna, and Dulcolax suppository but not regular dosing.  Denies any neurologic complaints. Denies recent fevers or illnesses. Denies any easy bleeding or bruising. Reports fair appetite. Denies chest pain. Denies urinary complaints. Patient offers no further specific complaints today.  PAST MEDICAL HISTORY: Past Medical History:  Diagnosis Date   Arthritis    Depression    GI bleeding    Hypertension    Pancreatic cancer (HCC)    Sleep apnea     PAST SURGICAL HISTORY:  Past Surgical History:  Procedure Laterality Date   COLONOSCOPY     COLONOSCOPY WITH PROPOFOL N/A 12/09/2022   Procedure: COLONOSCOPY WITH PROPOFOL;  Surgeon: Wyline Mood, MD;  Location: [facility] ENDOSCOPY;  Service: Gastroenterology;  Laterality: N/A;   ESOPHAGOGASTRODUODENOSCOPY (EGD) WITH PROPOFOL N/A 03/19/2016   Procedure: ESOPHAGOGASTRODUODENOSCOPY (EGD) WITH PROPOFOL;  Surgeon: Christena Deem, MD;  Location: [facility] ENDOSCOPY;  Service: Endoscopy;  Laterality: N/A;    HEMATOLOGY/ONCOLOGY HISTORY:  Oncology History  Prostate cancer metastatic to bone (HCC)  05/29/2021  Initial Diagnosis   Prostate cancer metastatic to bone (HCC)  03/19/21 - CT A/P with contrast performed to follow up on pancreatic mass, with near resolution of pancreatic uncinate process. Bulky multistation abdominopelvic lymphadenopathy identified (e.g. left para-aortic node measuring 2.1cm and right common iliac node measuring 1.4cm). No concerning osseous lesions identified. Enlarged, heterogeneously enhancing prostate gland. 04/17/21 - PSA 48.83 05/29/21 - retroperitoneal lymph node core needle biopsy, pathology consistent with metastatic prostate adenocarcinoma. 06/27/21 - PSMA-PET scan with "widely metastatic osseous disease as well as avid adenopathy extending from the pelvis to the left supraclavicular region consistent with metastatic prostate cancer. Noting that there is one single enlarged retroperitoneal node with low/minimal tracer avidity measuring up to 4 cm."  07/31/2022 PSA 406.39 08/27/21- 12/24/2021, 4 cycles. -docetaxel chemotherapy 75mg /m2 mid April 2023- darolutamide per ARASENS trial in mid April 2023  01/14/2022 PSMA showed marked interval improvement, tracer avid osseous disease now with only 6 lesions that demonstrating mild residual focal avidity  01/29/2023 PSA nadir  0.5 08/21/2022 PSA 3.32 09/25/2022, he developed castration resistant disease -Diffuse sclerotic lesions throughout the skeleton some of which demonstrate Pylarify uptake, as described in the body of the report, concerning for Pylarify avid metastatic disease. Uptake of the Pylarify avid lesions is overall similar to that of the liver.  -New uptake in the right posterior prostate and right seminal vesicle, concerning for malignancy.   Patient was recommended to stop darolutamide.  He was offered Pluvicto versus salvage chemotherapy with cabazitaxel.  He was undecided. 10/28/2022 PSA 18.92.         11/23/2022 Tumor Marker   PSA 117.5   11/23/2022 Imaging   Patient presented emergency  room for evaluation of  chest pain for 2 days. CT chest angiogram PE protocol 1. No evidence for acute pulmonary embolus. 2. Diffusely abnormal bones with multifocal areas of abnormal increased sclerosis noted throughout the thoracic spine, sternum and ribs concerning for diffuse osseous metastasis. 3. Prominent mediastinal and hilar lymph nodes are identified. These are nonspecific and may be reactive. Nodal metastasis not excluded. 4. Focal area of bronchiectasis with adjacent peripheral tree-in-bud nodularity in the right upper lobe. This is favored to represent sequelae of prior infection or inflammation. 5. Bilateral adrenal gland hyperplasia. 6. Cholelithiasis. 7. Irregular subpleural nodule in the anterior left upper lobe measures 4 mm. 8.  Aortic Atherosclerosis   11/27/2022 Cancer Staging   Staging form: Prostate, AJCC 8th Edition - Pathologic: Stage IVB (pT2, pN1, cM1, PSA: 48) - Signed by Rickard Patience, MD on 11/27/2022 Stage prefix: Initial diagnosis Prostate specific antigen (PSA) range: 20 or greater     ALLERGIES:  has No Known Allergies.  MEDICATIONS:  Current Outpatient Medications  Medication Sig Dispense Refill   amLODipine (NORVASC) 10 MG tablet Take 10 mg by mouth daily.     bisacodyl (DULCOLAX) 10 MG suppository Place 1 suppository (10 mg total) rectally daily as needed for up to 12 doses for moderate constipation. 12 suppository 0   magnesium hydroxide (MILK OF MAGNESIA) 400 MG/5ML suspension Take 5 mLs by mouth daily as needed for mild constipation.     metFORMIN (GLUCOPHAGE) 500 MG tablet Take by mouth.     ondansetron (ZOFRAN-ODT) 8 MG disintegrating tablet Take 1 tablet (8 mg total) by mouth every 8 (eight) hours as needed for nausea. 20 tablet 0   oxyCODONE (ROXICODONE) 5 MG immediate release tablet Take 1 tablet (5 mg total) by mouth every 8 (eight) hours as needed for up to 16 doses for breakthrough pain or severe pain. 12 tablet 0   pantoprazole (PROTONIX) 40 MG tablet Take 1 tablet  (40 mg total) by mouth daily. 30 tablet 2   polyethylene glycol (MIRALAX / GLYCOLAX) 17 g packet Take 17 g by mouth daily.     potassium chloride SA (KLOR-CON M) 20 MEQ tablet TAKE 2 TABLETS BY MOUTH DAILY FOR POTASSIUM     pravastatin (PRAVACHOL) 20 MG tablet Take 20 mg by mouth daily.     senna-docusate (SENOKOT-S) 8.6-50 MG tablet Take 1 tablet by mouth daily as needed for up to 30 doses for mild constipation. 30 tablet 0   tamsulosin (FLOMAX) 0.4 MG CAPS capsule Take 1 capsule by mouth daily.     GAVILYTE-G 236 g solution Take 4,000 mLs by mouth as directed. (Patient not taking: Reported on 01/15/2023)     No current facility-administered medications for this visit.    VITAL SIGNS: BP (!) 146/73   Pulse 92   Temp 97.9 F (36.6 C) (Tympanic)   Resp 18   SpO2 100%  There were no vitals filed for this visit.  Estimated body mass index is 21.71 kg/m as calculated from the following:   Height as of 01/07/23: 5\' 9"  (1.753 m).   Weight as of 01/07/23: 147 lb (66.7 kg).  LABS: CBC:    Component Value Date/Time   WBC 10.7 (H) 01/15/2023 1035   WBC 13.3 (H) 01/10/2023 1027   HGB 8.9 (L) 01/15/2023 1035   HGB 11.4 (L) 09/08/2013 1436   HCT 27.3 (L) 01/15/2023 1035   HCT 36.3 (L) 09/08/2013 1436   PLT 645 (H) 01/15/2023 1035   PLT 315 09/08/2013 1436  MCV 73.4 (L) 01/15/2023 1035   MCV 83 09/08/2013 1436   NEUTROABS 7.7 01/15/2023 1035   NEUTROABS 1.8 08/05/2013 0616   LYMPHSABS 1.7 01/15/2023 1035   LYMPHSABS 1.6 08/05/2013 0616   MONOABS 1.0 01/15/2023 1035   MONOABS 0.8 08/05/2013 0616   EOSABS 0.0 01/15/2023 1035   EOSABS 0.1 08/05/2013 0616   BASOSABS 0.0 01/15/2023 1035   BASOSABS 1 09/08/2013 1436   BASOSABS 0.0 08/05/2013 0616   Comprehensive Metabolic Panel:    Component Value Date/Time   NA 124 (L) 01/15/2023 1035   NA 138 08/04/2013 0842   K 3.4 (L) 01/15/2023 1035   K 3.9 08/04/2013 0842   CL 85 (L) 01/15/2023 1035   CL 106 08/04/2013 0842   CO2 28  01/15/2023 1035   CO2 28 08/04/2013 0842   BUN 8 01/15/2023 1035   BUN 5 (L) 08/04/2013 0842   CREATININE 0.77 01/15/2023 1035   CREATININE 1.32 (H) 08/04/2013 0842   GLUCOSE 216 (H) 01/15/2023 1035   GLUCOSE 116 (H) 08/04/2013 0842   CALCIUM 9.1 01/15/2023 1035   CALCIUM 8.4 (L) 08/04/2013 0842   AST 21 01/15/2023 1035   ALT 8 01/15/2023 1035   ALT 58 08/02/2013 1135   ALKPHOS 105 01/15/2023 1035   ALKPHOS 62 08/02/2013 1135   BILITOT 0.7 01/15/2023 1035   PROT 8.4 (H) 01/15/2023 1035   PROT 8.8 (H) 08/02/2013 1135   ALBUMIN 3.0 (L) 01/15/2023 1035   ALBUMIN 4.2 08/02/2013 1135    RADIOGRAPHIC STUDIES: CT Angio Chest PE W/Cm &/Or Wo Cm  Result Date: 01/07/2023 CLINICAL DATA:  pt reports since last night having left sided CP, nonradiating accomp by nausea; Abdominal pain, acute, nonlocalized History of metastatic prostate cancer. EXAM: CT ANGIOGRAPHY CHEST CT ABDOMEN AND PELVIS WITH CONTRAST TECHNIQUE: Multidetector CT imaging of the chest was performed using the standard protocol during bolus administration of intravenous contrast. Multiplanar CT image reconstructions and MIPs were obtained to evaluate the vascular anatomy. Multidetector CT imaging of the abdomen and pelvis was performed using the standard protocol during bolus administration of intravenous contrast. RADIATION DOSE REDUCTION: This exam was performed according to the departmental dose-optimization program which includes automated exposure control, adjustment of the mA and/or kV according to patient size and/or use of iterative reconstruction technique. CONTRAST:  OMNIPAQUE IOHEXOL 350 MG/ML SOLN COMPARISON:  Chest XR, 01/07/2023. CTA chest, 11/23/2022. CT AP, 03/26/2023. PET-CT, 12/18/2022. FINDINGS: CTA CHEST FINDINGS Cardiovascular: Satisfactory opacification of the pulmonary arteries to the segmental level. No segmental or larger pulmonary embolus. normal heart size. No pericardial effusion. Mediastinum/Nodes:  Interval enlargement in mediastinal adenopathy, with representative prevascular and precarinal lymph nodes measuring approximately 2.5 x 1.7 cm and 2.5 x 3.8 cm (AP by transaxial) respectively. No enlarged axillary lymph nodes. Thyroid gland, trachea, and esophagus demonstrate no significant findings. Lungs/Pleura: Mild basilar atelectasis. Lungs are otherwise clear without focal consolidation, mass or suspicious pulmonary nodule. No pleural effusion or pneumothorax. Musculoskeletal: Mild gynecomastia. No acute chest wall abnormality. Multifocal sclerotic osseous lesions within the imaged thoracic spine. Review of the MIP images confirms the above findings. CT ABDOMEN and PELVIS FINDINGS Hepatobiliary: Normal volume of liver and contour. Increased number and conspicuity of small, multifocal rounded and hypodense lesions scattered within liver contracted gallbladder with dense gallstones. No gallbladder wall thickening, or biliary dilatation. Pancreas: Relatively similar size and appearance ~ 1 cm hypodense pancreatic cystic lesion at the uncinate. No pancreatic ductal dilatation or surrounding inflammatory changes. Spleen: Normal in size without focal  abnormality. Adrenals/Urinary Tract: Adrenal glands are unremarkable. Kidneys are normal, without renal calculi, focal lesion, or hydronephrosis. Bladder is decompressed by Foley catheter. Stomach/Bowel: Stomach is within normal limits. Appendix is not definitively visualized. Nonobstructed small bowel. Nondilated colon. Severe burden of colonic diverticulosis, involving the descending and transverse colon. No evidence of bowel wall thickening, distention, or acute inflammatory change. Vascular/Lymphatic: No significant vascular findings are present. No enlarged abdominal or pelvic lymph nodes. Reproductive: Irregular nodular contour of prostate. Other: No abdominopelvic ascites or intraperitoneal free air. Postsurgical changes of prior laparotomy. Fat-containing,  umbilical ventral hernia. Musculoskeletal: No acute osseous finding teams or displaced fracture. Multifocal osseous sclerotic lesions within the imaged pelvis and axial spine. Review of the MIP images confirms the above findings. IMPRESSION: Since CT CAP dated 12/15/2022; 1. No segmental or larger pulmonary embolus. 2. No acute abdominopelvic process. 3. Progression of metastatic disease, with enlargement in mediastinal adenopathy within chest, and increased number and conspicuity of small multifocal lesions within liver, as described in detail above. Electronically Signed   By: Roanna Banning M.D.   On: 01/07/2023 09:29   CT ABDOMEN PELVIS W CONTRAST  Result Date: 01/07/2023 CLINICAL DATA:  pt reports since last night having left sided CP, nonradiating accomp by nausea; Abdominal pain, acute, nonlocalized History of metastatic prostate cancer. EXAM: CT ANGIOGRAPHY CHEST CT ABDOMEN AND PELVIS WITH CONTRAST TECHNIQUE: Multidetector CT imaging of the chest was performed using the standard protocol during bolus administration of intravenous contrast. Multiplanar CT image reconstructions and MIPs were obtained to evaluate the vascular anatomy. Multidetector CT imaging of the abdomen and pelvis was performed using the standard protocol during bolus administration of intravenous contrast. RADIATION DOSE REDUCTION: This exam was performed according to the departmental dose-optimization program which includes automated exposure control, adjustment of the mA and/or kV according to patient size and/or use of iterative reconstruction technique. CONTRAST:  OMNIPAQUE IOHEXOL 350 MG/ML SOLN COMPARISON:  Chest XR, 01/07/2023. CTA chest, 11/23/2022. CT AP, 03/26/2023. PET-CT, 12/18/2022. FINDINGS: CTA CHEST FINDINGS Cardiovascular: Satisfactory opacification of the pulmonary arteries to the segmental level. No segmental or larger pulmonary embolus. normal heart size. No pericardial effusion. Mediastinum/Nodes: Interval  enlargement in mediastinal adenopathy, with representative prevascular and precarinal lymph nodes measuring approximately 2.5 x 1.7 cm and 2.5 x 3.8 cm (AP by transaxial) respectively. No enlarged axillary lymph nodes. Thyroid gland, trachea, and esophagus demonstrate no significant findings. Lungs/Pleura: Mild basilar atelectasis. Lungs are otherwise clear without focal consolidation, mass or suspicious pulmonary nodule. No pleural effusion or pneumothorax. Musculoskeletal: Mild gynecomastia. No acute chest wall abnormality. Multifocal sclerotic osseous lesions within the imaged thoracic spine. Review of the MIP images confirms the above findings. CT ABDOMEN and PELVIS FINDINGS Hepatobiliary: Normal volume of liver and contour. Increased number and conspicuity of small, multifocal rounded and hypodense lesions scattered within liver contracted gallbladder with dense gallstones. No gallbladder wall thickening, or biliary dilatation. Pancreas: Relatively similar size and appearance ~ 1 cm hypodense pancreatic cystic lesion at the uncinate. No pancreatic ductal dilatation or surrounding inflammatory changes. Spleen: Normal in size without focal abnormality. Adrenals/Urinary Tract: Adrenal glands are unremarkable. Kidneys are normal, without renal calculi, focal lesion, or hydronephrosis. Bladder is decompressed by Foley catheter. Stomach/Bowel: Stomach is within normal limits. Appendix is not definitively visualized. Nonobstructed small bowel. Nondilated colon. Severe burden of colonic diverticulosis, involving the descending and transverse colon. No evidence of bowel wall thickening, distention, or acute inflammatory change. Vascular/Lymphatic: No significant vascular findings are present. No enlarged abdominal or pelvic lymph  nodes. Reproductive: Irregular nodular contour of prostate. Other: No abdominopelvic ascites or intraperitoneal free air. Postsurgical changes of prior laparotomy. Fat-containing, umbilical  ventral hernia. Musculoskeletal: No acute osseous finding teams or displaced fracture. Multifocal osseous sclerotic lesions within the imaged pelvis and axial spine. Review of the MIP images confirms the above findings. IMPRESSION: Since CT CAP dated 12/15/2022; 1. No segmental or larger pulmonary embolus. 2. No acute abdominopelvic process. 3. Progression of metastatic disease, with enlargement in mediastinal adenopathy within chest, and increased number and conspicuity of small multifocal lesions within liver, as described in detail above. Electronically Signed   By: Roanna Banning M.D.   On: 01/07/2023 09:29   DG Chest Portable 1 View  Result Date: 01/07/2023 CLINICAL DATA:  Chest pain EXAM: PORTABLE CHEST 1 VIEW COMPARISON:  11/23/2022 FINDINGS: Artifact from EKG leads. Rounded density over the left mid lung is attributed to the anterior fifth rib based on prior CT, there is known prostate cancer and bone metastases. There is no edema, consolidation, effusion, or pneumothorax. Normal heart size and mediastinal contours. IMPRESSION: No active disease. Electronically Signed   By: Tiburcio Pea M.D.   On: 01/07/2023 07:41   NM Radiologist Eval And Mgmt  Result Date: 12/19/2022 EXAM: NEW PATIENT OFFICE VISIT CHIEF COMPLAINT: Castrate resistant metastatic prostate carcinoma. Progression on chemotherapy. Local recurrence in the pelvis and progressive skeletal metastasis. Current Pain Level: 1-10 HISTORY OF PRESENT ILLNESS: See epic note REVIEW OF SYSTEMS: See epic note PHYSICAL EXAMINATION: See epic note ASSESSMENT AND PLAN: Patient is adequate candidate Lu 177 PSMA therapy ( vipivotide tetraxetan). Patient demonstrates mild-to-moderate progression metastatic pelvic recurrence and skeletal disease identified on recent PSMA PET scan. Additionally patient's PSA is gradually increasing. Patient has demonstrated progression on androgen deprivation and taxane chemotherapy. Patient explained major and minor risks and  benefits of therapy. Major benefit being progression-free survival. Major risk being myelosuppression and renal toxicity. Minor toxicity of xerostomia. All the patient's questions were answered. Patient accompanied by daughter who was also present for consult. Patient is scheduled for 6 treatments spaced 6 weeks apart. Recommend following up with oncologist for CBC and CMP 1 week prior to each treatment to assess safety of continuing with therapy. Electronically Signed   By: Genevive Bi M.D.   On: 12/19/2022 15:29   NM PET (PSMA) SKULL TO MID THIGH  Result Date: 12/19/2022 CLINICAL DATA:  Prostate cancer with bone metastasis. Elevated PSA equal 117 and rising. EXAM: NUCLEAR MEDICINE PET SKULL BASE TO THIGH TECHNIQUE: 9.24 mCi F18 Piflufolastat (Pylarify) was injected intravenously. Full-ring PET imaging was performed from the skull base to thigh after the radiotracer. CT data was obtained and used for attenuation correction and anatomic localization. COMPARISON:  None Available. FINDINGS: NECK No radiotracer activity in neck lymph nodes. Incidental CT finding: None. CHEST No radiotracer accumulation within mediastinal or hilar lymph nodes. No suspicious pulmonary nodules on the CT scan. Incidental CT finding: None. ABDOMEN/PELVIS Prostate: Mass at the base of the LEFT similar vesicle measures 3.2 x 2.9 cm with intense radiotracer activity (SUV max equal 5.0). Minimal activity within the prostate gland. Foley catheter within the prosthetic urethra extending into bladder Lymph nodes: No abnormal radiotracer accumulation within pelvic or abdominal nodes. Liver: No evidence of liver metastasis. Incidental CT finding: Foley catheter in place. SKELETON Multiple foci of moderately intense radiotracer activity scattered throughout the pelvis and spine. underlying more diffuse pattern of sclerotic metastasis the majority which not have metabolic activity. For example small lesion in the posterior  LEFT iliac bone  with SUV max equal 6.1 (image 144). Lesion in the L2 vertebral body with SUV max equal 9.4 on image 133. More mild activity associated sclerotic activity in the L1 vertebral body with SUV max equal 3.4. Several rib lesions with radiotracer activity. For example posterior RIGHT rib lesion with SUV max equal 4.3 on image90. Activity within these skeletal lesions is moderately elevated above liver activity (SUV max equal 3.8). IMPRESSION: 1. Radiotracer avid mass at the base of the RIGHT seminal vesicle is consistent with extra prostatic prostate carcinoma. 2. Minimal activity within the prostate gland . Foley catheter in place 3. Multifocal sclerotic skeletal metastasis. The majority of the sclerotic metastasis have minimal radiotracer activity. Several discrete lesions have intense radiotracer activity. Activity is greater than liver moderately activity. Electronically Signed   By: Genevive Bi M.D.   On: 12/19/2022 14:38    PERFORMANCE STATUS (ECOG) : 1 - Symptomatic but completely ambulatory  Review of Systems Unless otherwise noted, a complete review of systems is negative.  Physical Exam General: NAD Cardiovascular: regular rate and rhythm Pulmonary: clear ant fields Abdomen: soft, hypoactive bowel sounds, slight generalized tenderness but no guarding, no organomegaly, no rebound tenderness GU: no suprapubic tenderness Extremities: no edema, no joint deformities Skin: no rashes Neurological: Weakness but otherwise nonfocal  IMPRESSION/PLAN: Stage IV prostate cancer -being evaluated for Pluvicto  Constipation -likely opioid induced.  Last bowel movement 4 days ago.  Patient has tried bowel regimen but not with regular dosing.  He had CT of abdomen and pelvis last week, which did not show any acute abdomen or pelvic process.  However, given symptoms, patient was sent for abdominal x-ray to rule out obstructive bowel gas pattern.  KUB revealed nonobstructive bowel gas pattern and large  colonic stool burden.  Will start on lactulose.  Case and plan discussed with Dr. Cathie Hoops who will see patient tomorrow.  Patient expressed understanding and was in agreement with this plan. He also understands that He can call clinic at any time with any questions, concerns, or complaints.   Thank you for allowing me to participate in the care of this very pleasant patient.   Time Total: 20 minutes  Visit consisted of counseling and education dealing with the complex and emotionally intense issues of symptom management in the setting of serious illness.Greater than 50%  of this time was spent counseling and coordinating care related to the above assessment and plan.  Signed by: Laurette Schimke, PhD, NP-C

## 2023-01-15 NOTE — Telephone Encounter (Signed)
See encounter today for APP for charting

## 2023-01-16 ENCOUNTER — Inpatient Hospital Stay: Payer: Medicaid Other | Admitting: Oncology

## 2023-01-16 ENCOUNTER — Inpatient Hospital Stay: Payer: Medicaid Other

## 2023-01-18 ENCOUNTER — Emergency Department: Payer: Medicaid Other

## 2023-01-18 ENCOUNTER — Other Ambulatory Visit: Payer: Self-pay

## 2023-01-18 ENCOUNTER — Inpatient Hospital Stay
Admission: EM | Admit: 2023-01-18 | Discharge: 2023-01-24 | DRG: 392 | Disposition: A | Discharge status: Home / Self Care | Payer: Medicaid Other | Attending: Internal Medicine | Admitting: Internal Medicine

## 2023-01-18 DIAGNOSIS — I10 Essential (primary) hypertension: Secondary | ICD-10-CM | POA: Diagnosis present

## 2023-01-18 DIAGNOSIS — K5903 Drug induced constipation: Secondary | ICD-10-CM | POA: Diagnosis present

## 2023-01-18 DIAGNOSIS — E440 Moderate protein-calorie malnutrition: Secondary | ICD-10-CM | POA: Diagnosis present

## 2023-01-18 DIAGNOSIS — D72829 Elevated white blood cell count, unspecified: Secondary | ICD-10-CM | POA: Diagnosis present

## 2023-01-18 DIAGNOSIS — G4730 Sleep apnea, unspecified: Secondary | ICD-10-CM | POA: Diagnosis present

## 2023-01-18 DIAGNOSIS — Z6820 Body mass index (BMI) 20.0-20.9, adult: Secondary | ICD-10-CM

## 2023-01-18 DIAGNOSIS — R531 Weakness: Principal | ICD-10-CM

## 2023-01-18 DIAGNOSIS — R059 Cough, unspecified: Secondary | ICD-10-CM | POA: Diagnosis present

## 2023-01-18 DIAGNOSIS — D509 Iron deficiency anemia, unspecified: Secondary | ICD-10-CM | POA: Diagnosis present

## 2023-01-18 DIAGNOSIS — R1310 Dysphagia, unspecified: Secondary | ICD-10-CM

## 2023-01-18 DIAGNOSIS — D649 Anemia, unspecified: Secondary | ICD-10-CM

## 2023-01-18 DIAGNOSIS — Z1152 Encounter for screening for COVID-19: Secondary | ICD-10-CM

## 2023-01-18 DIAGNOSIS — C61 Malignant neoplasm of prostate: Secondary | ICD-10-CM | POA: Diagnosis present

## 2023-01-18 DIAGNOSIS — F17210 Nicotine dependence, cigarettes, uncomplicated: Secondary | ICD-10-CM | POA: Diagnosis present

## 2023-01-18 DIAGNOSIS — R1319 Other dysphagia: Secondary | ICD-10-CM

## 2023-01-18 DIAGNOSIS — C7951 Secondary malignant neoplasm of bone: Secondary | ICD-10-CM | POA: Diagnosis present

## 2023-01-18 DIAGNOSIS — K224 Dyskinesia of esophagus: Secondary | ICD-10-CM | POA: Diagnosis present

## 2023-01-18 DIAGNOSIS — K5710 Diverticulosis of small intestine without perforation or abscess without bleeding: Secondary | ICD-10-CM | POA: Diagnosis present

## 2023-01-18 DIAGNOSIS — D500 Iron deficiency anemia secondary to blood loss (chronic): Secondary | ICD-10-CM

## 2023-01-18 DIAGNOSIS — E876 Hypokalemia: Secondary | ICD-10-CM | POA: Diagnosis not present

## 2023-01-18 DIAGNOSIS — E871 Hypo-osmolality and hyponatremia: Secondary | ICD-10-CM | POA: Diagnosis present

## 2023-01-18 DIAGNOSIS — E860 Dehydration: Secondary | ICD-10-CM | POA: Diagnosis present

## 2023-01-18 DIAGNOSIS — C787 Secondary malignant neoplasm of liver and intrahepatic bile duct: Secondary | ICD-10-CM | POA: Diagnosis present

## 2023-01-18 DIAGNOSIS — K920 Hematemesis: Secondary | ICD-10-CM | POA: Diagnosis present

## 2023-01-18 DIAGNOSIS — Z79899 Other long term (current) drug therapy: Secondary | ICD-10-CM

## 2023-01-18 DIAGNOSIS — R6510 Systemic inflammatory response syndrome (SIRS) of non-infectious origin without acute organ dysfunction: Secondary | ICD-10-CM | POA: Diagnosis present

## 2023-01-18 DIAGNOSIS — Z8507 Personal history of malignant neoplasm of pancreas: Secondary | ICD-10-CM

## 2023-01-18 DIAGNOSIS — Z7984 Long term (current) use of oral hypoglycemic drugs: Secondary | ICD-10-CM

## 2023-01-18 DIAGNOSIS — K219 Gastro-esophageal reflux disease without esophagitis: Secondary | ICD-10-CM | POA: Diagnosis present

## 2023-01-18 DIAGNOSIS — D75839 Thrombocytosis, unspecified: Secondary | ICD-10-CM | POA: Diagnosis present

## 2023-01-18 DIAGNOSIS — C779 Secondary and unspecified malignant neoplasm of lymph node, unspecified: Secondary | ICD-10-CM | POA: Diagnosis present

## 2023-01-18 DIAGNOSIS — R1314 Dysphagia, pharyngoesophageal phase: Principal | ICD-10-CM | POA: Diagnosis present

## 2023-01-18 LAB — URINALYSIS, ROUTINE W REFLEX MICROSCOPIC
Bilirubin Urine: NEGATIVE
Glucose, UA: NEGATIVE mg/dL
Ketones, ur: 20 mg/dL — AB
Nitrite: NEGATIVE
Protein, ur: 100 mg/dL — AB
RBC / HPF: >50 RBC/hpf (ref 0–5)
Specific Gravity, Urine: 1.02 (ref 1.005–1.030)
Squamous Epithelial / HPF: 0 /HPF (ref 0–5)
WBC, UA: >50 WBC/hpf (ref 0–5)
pH: 5 (ref 5.0–8.0)

## 2023-01-18 LAB — BASIC METABOLIC PANEL
Anion gap: 13 (ref 5–15)
BUN: 12 mg/dL (ref 8–23)
CO2: 27 mmol/L (ref 22–32)
Calcium: 8.9 mg/dL (ref 8.9–10.3)
Chloride: 83 mmol/L — ABNORMAL LOW (ref 98–111)
Creatinine, Ser: 0.79 mg/dL (ref 0.61–1.24)
GFR, Estimated: >60 mL/min (ref >60)
Glucose, Bld: 173 mg/dL — ABNORMAL HIGH (ref 70–99)
Potassium: 3.3 mmol/L — ABNORMAL LOW (ref 3.5–5.1)
Sodium: 123 mmol/L — ABNORMAL LOW (ref 135–145)

## 2023-01-18 LAB — CBC
HCT: 25 % — ABNORMAL LOW (ref 39.0–52.0)
Hemoglobin: 8.4 g/dL — ABNORMAL LOW (ref 13.0–17.0)
MCH: 24.3 pg — ABNORMAL LOW (ref 26.0–34.0)
MCHC: 33.6 g/dL (ref 30.0–36.0)
MCV: 72.3 fL — ABNORMAL LOW (ref 80.0–100.0)
Platelets: 616 10*3/uL — ABNORMAL HIGH (ref 150–400)
RBC: 3.46 MIL/uL — ABNORMAL LOW (ref 4.22–5.81)
RDW: 16.6 % — ABNORMAL HIGH (ref 11.5–15.5)
WBC: 15.8 10*3/uL — ABNORMAL HIGH (ref 4.0–10.5)
nRBC: 0.1 % (ref 0.0–0.2)

## 2023-01-18 LAB — HEPATIC FUNCTION PANEL
ALT: 8 U/L (ref 0–44)
AST: 17 U/L (ref 15–41)
Albumin: 2.6 g/dL — ABNORMAL LOW (ref 3.5–5.0)
Alkaline Phosphatase: 125 U/L (ref 38–126)
Bilirubin, Direct: 0.3 mg/dL — ABNORMAL HIGH (ref 0.0–0.2)
Indirect Bilirubin: 0.8 mg/dL (ref 0.3–0.9)
Total Bilirubin: 1.1 mg/dL (ref 0.3–1.2)
Total Protein: 8.3 g/dL — ABNORMAL HIGH (ref 6.5–8.1)

## 2023-01-18 LAB — SODIUM, URINE, RANDOM: Sodium, Ur: 25 mmol/L

## 2023-01-18 LAB — OSMOLALITY: Osmolality: 269 mosm/kg — ABNORMAL LOW (ref 275–295)

## 2023-01-18 LAB — OSMOLALITY, URINE: Osmolality, Ur: 600 mosm/kg (ref 300–900)

## 2023-01-18 LAB — PROCALCITONIN: Procalcitonin: 0.47 ng/mL

## 2023-01-18 LAB — LIPASE, BLOOD: Lipase: 21 U/L (ref 11–51)

## 2023-01-18 LAB — RESP PANEL BY RT-PCR (FLU A&B, COVID) ARPGX2
Influenza A by PCR: NEGATIVE
Influenza B by PCR: NEGATIVE
SARS Coronavirus 2 by RT PCR: NEGATIVE

## 2023-01-18 MED ORDER — ENOXAPARIN SODIUM 40 MG/0.4ML IJ SOSY
40.0000 mg | PREFILLED_SYRINGE | INTRAMUSCULAR | Status: DC
  Administered 2023-01-18 – 2023-01-23 (×6): 40 mg via SUBCUTANEOUS
  Filled 2023-01-18 (×6): qty 0.4

## 2023-01-18 MED ORDER — POLYETHYLENE GLYCOL 3350 17 G PO PACK
34.0000 g | PACK | ORAL | Status: AC
  Administered 2023-01-18 (×3): 34 g via ORAL
  Filled 2023-01-18 (×3): qty 2

## 2023-01-18 MED ORDER — SODIUM CHLORIDE 0.9 % IV BOLUS
250.0000 mL | Freq: Once | INTRAVENOUS | Status: AC
  Administered 2023-01-18: 250 mL via INTRAVENOUS

## 2023-01-18 MED ORDER — ENSURE ENLIVE PO LIQD
237.0000 mL | Freq: Three times a day (TID) | ORAL | Status: DC
  Administered 2023-01-18 – 2023-01-24 (×13): 237 mL via ORAL

## 2023-01-18 MED ORDER — TAMSULOSIN HCL 0.4 MG PO CAPS
0.4000 mg | ORAL_CAPSULE | Freq: Every day | ORAL | Status: DC
  Administered 2023-01-18 – 2023-01-24 (×7): 0.4 mg via ORAL
  Filled 2023-01-18 (×7): qty 1

## 2023-01-18 NOTE — ED Notes (Signed)
Checked to see if pt voided. No output noted in foley bag in order to obtain the clean catch. Family at bedside.

## 2023-01-18 NOTE — ED Provider Notes (Signed)
[facility] Provider Note    Event Date/Time   First MD Initiated Contact with Patient 01/18/23 1134     (approximate)   History   Weakness   HPI  Louis Gardner is a 62 y.o. 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  Is also recently been evaluated for vomiting abdominal pain at the ER on 9 6   He has been for a couple weeks ago unable to keep solid food down when swallowing.  He is able to pass liquids and lots of Ensure drinks but anything solid in the vomit up.  Last night he vomited once.  He has little appetite  He has had progressive feeling of increasing fatigue and weakness.  No chest pain no trouble breathing no fevers.  He reports a very mild cough.  Skin.  Time not new.  Physical Exam   Triage Vital Signs: ED Triage Vitals  Encounter Vitals Group     BP 01/18/23 0956 103/67     Systolic BP Percentile --      Diastolic BP Percentile --      Pulse Rate 01/18/23 0956 (!) 104     Resp 01/18/23 0956 18     Temp 01/18/23 0956 98.3 F (36.8 C)     Temp src --      SpO2 01/18/23 0956 97 %     Weight --      Height --      Head Circumference --      Peak Flow --      Pain Score 01/18/23 0950 0     Pain Loc --      Pain Education --      Exclude from Growth Chart --     Most recent vital signs: Vitals:   01/18/23 1425 01/18/23 1832  BP: 128/66 130/80  Pulse: 98 89  Resp: 17 16  Temp: 98.4 F (36.9 C)   SpO2: 97% 100%     General: Awake, no distress.  Very pleasant.  Mucous membranes are moist.  Fully alert and oriented. CV:  Good peripheral perfusion.  Normal tones and rate Resp:  Normal effort.  Clear bilateral Abd:  No distention.  Soft nontender nondistended throughout Other:     ED Results / Procedures / Treatments   Labs (all labs ordered are listed, but only abnormal results are  displayed) Labs Reviewed  BASIC METABOLIC PANEL - Abnormal; Notable for the following components:      Result Value   Sodium 123 (*)    Potassium 3.3 (*)    Chloride 83 (*)    Glucose, Bld 173 (*)    All other components within normal limits  CBC - Abnormal; Notable for the following components:   WBC 15.8 (*)    RBC 3.46 (*)    Hemoglobin 8.4 (*)    HCT 25.0 (*)    MCV 72.3 (*)    MCH 24.3 (*)    RDW 16.6 (*)    Platelets 616 (*)    All other components within normal limits  HEPATIC FUNCTION PANEL - Abnormal; Notable for the following components:   Total Protein 8.3 (*)    Albumin 2.6 (*)    Bilirubin, Direct 0.3 (*)    All other components within normal limits  OSMOLALITY - Abnormal; Notable for the following components:  Osmolality 269 (*)    All other components within normal limits  RESP PANEL BY RT-PCR (FLU A&B, COVID) ARPGX2  LIPASE, BLOOD  PROCALCITONIN  URINALYSIS, ROUTINE W REFLEX MICROSCOPIC  OSMOLALITY, URINE  SODIUM, URINE, RANDOM  BASIC METABOLIC PANEL  CBC  MAGNESIUM  HIV ANTIBODY (ROUTINE TESTING W REFLEX)  CBG MONITORING, ED   Labs notable for hyponatremia sodium 123 which represents a ongoing downtrend (sodium somewhat chronic basis.  Additionally has mild hypokalemia.  His white count is notable at 16,000, but he lacks acute infectious symptoms.  No fever no chills he reports a cough but that is been present for months and lung sounds are clear without respiratory distress.  No associate abdominal pain nausea or vomiting.  No evidence of  noted elsewhere.   Urinalysis remains pending at time of admission decision.  Elevated white count EKG  Interpreted by me at 10 AM, sinus tachycardia, nonspecific artifact versus mild T wave abnormality noted.  No evidence of acute ischemia   RADIOLOGY  Chest x-ray interpreted by me as negative for obvious infiltrate.  Radiologist did note atelectasis versus mild infiltrate in the bases bilaterally.  Given the  patient's lack of fever chronic cough without acute change will send procalcitonin as a screening tool.  Discussed with Dr. Fran Lowes (pending procalcitonin), no antibiotics initiated in ED as low pretest probablity of PNA noted at this time. UA pending as time of admit decision as well   PROCEDURES:  Critical Care performed: No  Procedures   MEDICATIONS ORDERED IN ED: Medications  tamsulosin (FLOMAX) capsule 0.4 mg (0.4 mg Oral Given 01/18/23 1657)  enoxaparin (LOVENOX) injection 40 mg (has no administration in time range)  feeding supplement (ENSURE ENLIVE / ENSURE PLUS) liquid 237 mL (has no administration in time range)  polyethylene glycol (MIRALAX / GLYCOLAX) packet 34 g (has no administration in time range)  sodium chloride 0.9 % bolus 250 mL (0 mLs Intravenous Stopped 01/18/23 1655)     IMPRESSION / MDM / ASSESSMENT AND PLAN / ED COURSE  I reviewed the triage vital signs and the nursing notes.                              Differential diagnosis includes, but is not limited to, suspect hypovolemic hyponatremia.  Also has chronic hyponatremia in the setting of prostate cancer.  He does not exhibit any infectious symptoms.  His abdomen is soft nontender nondistended throughout he denies any abdominal pain.  When he does endorse though is that he has inability to tolerate solids, and if he does anything solid he vomits it up.  Concerning for possible obstructive pathology.  He also has an upcoming GI appointment but has not has had upper endoscopy.  Based on the patient's generalized fatigue, increasing hyponatremia, and SIRS criteria discussed with the hospitalist, patient will be admitted for further care and workup as to causation.  Again, at this juncture no clear evidence of infectious cause.  See documentation as above.  Pending tests including procalcitonin and urinalysis  Further care under hospitalist services  Patient's presentation is most consistent with acute complicated  illness / injury requiring diagnostic workup.   The patient is on the cardiac monitor to evaluate for evidence of arrhythmia and/or significant heart rate changes.  Patient is understanding agreeable plan for admission     FINAL CLINICAL IMPRESSION(S) / ED DIAGNOSES   Final diagnoses:  Weakness  Dehydration  Hyponatremia  SIRS (  systemic inflammatory response syndrome) (HCC)  Dysphagia, unspecified type     Rx / DC Orders   ED Discharge Orders     None        Note:  This document was prepared using Dragon voice recognition software and may include unintentional dictation errors.   Sharyn Creamer, MD 01/18/23 2001

## 2023-01-18 NOTE — ED Triage Notes (Addendum)
Pt comes via EMS from home with c/o weakness. Pt has prostate cancer and hasn't started any treatment yet. Pt states no appetite and dec intake. Pt states he has only been able to consume 2 ensures a day and some beer.   Pt states vomiting of blood last night. Pt states bright red. Last BM week ago but still passing gas.

## 2023-01-18 NOTE — H&P (Signed)
History and Physical    Louis Gardner ZOX:[id number] DOB: 1960/07/17 DOA: 01/18/2023  PCP: [facility]  Patient coming from: home  I have personally briefly reviewed patient's old medical records in [facility]  Chief Complaint: weakness and can't swallow solid food  HPI: Louis Gardner is a 62 y.o. 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  Pt reported for the past 1.5 week, he has been having trouble swallowing solids; after he swallows solid food, it would come back up as vomit.  Pt reported vomitus last night had specks of blood in it.  Liquid goes down ok, and pt has been drinking Ensure and beer (about 1 per day).  No abdominal pain.  Has chronic constipation with last BM over 1 week ago.  Pt also reported weakness and weight loss.  No fever, dyspnea, chest pain, dysuria.  Pt said he is due to start chemo for his prostate cancer on 9/19.  ED Course: initial vitals: afebrile, pulse 104, BP 103/67, RR 18, sating 97% on room air.  Labs notable for Na 123, potassium 3.3, WBC 15.8, Hgb 8.4.  ED provider requested admission for GI workup.   Assessment/Plan  Dysphagia --unable to swallow and keep solid foods down.  Reportedly specks of blood in vomitus. Plan: --GI consult --Full liquid diet --Ensure TID  Hyponatremia, acute on chronic --does have baseline chronic hyponatremia dating back to 2021.  Na 123 on presentation. --urine osm and Na studies  Prostate cancer with mets --being followed by Oncology Dr. Cathie Hoops and NP Borders. --will consult on Monday  Chronic constipation --aggressive Miralax  Chronic Foley --pt said Foley is for his prostate cancer.  Foley exchanged in the ED. --cont Flomax  Hypokalemia --monitor and supplement PRN  Microcytic anemia --Hgb 8's, stable --monitor  Leukocytosis --no  obvious signs of infection.   --Hold abx   DVT prophylaxis: Lovenox SQ Code Status: Full code  Family Communication: girlfriend updated at bedside today  Disposition Plan: home  Consults called: GI Level of care: Med-Surg   Review of Systems: As per HPI otherwise complete review of systems negative.   Past Medical History:  Diagnosis Date   Arthritis    Depression    GI bleeding    Hypertension    Pancreatic cancer (HCC)    Sleep apnea     Past Surgical History:  Procedure Laterality Date   COLONOSCOPY     COLONOSCOPY WITH PROPOFOL N/A 12/09/2022   Procedure: COLONOSCOPY WITH PROPOFOL;  Surgeon: Wyline Mood, MD;  Location: [facility] ENDOSCOPY;  Service: Gastroenterology;  Laterality: N/A;   ESOPHAGOGASTRODUODENOSCOPY (EGD) WITH PROPOFOL N/A 03/19/2016   Procedure: ESOPHAGOGASTRODUODENOSCOPY (EGD) WITH PROPOFOL;  Surgeon: Christena Deem, MD;  Location: [facility] ENDOSCOPY;  Service: Endoscopy;  Laterality: N/A;     reports that he has been smoking cigarettes. He has a 12 pack-year smoking history. He has been exposed to tobacco smoke. He has never used smokeless tobacco. He reports current alcohol use of about 6.0 standard drinks of alcohol per week. He reports that he does not use drugs.  No Known Allergies  History reviewed. No pertinent family history.   Prior to Admission medications   Medication Sig Start Date End Date Taking? Authorizing Provider  amLODipine (NORVASC) 10 MG tablet Take 10 mg by mouth daily. 04/19/21   [provider]  bisacodyl (DULCOLAX) 10 MG suppository  Place 1 suppository (10 mg total) rectally daily as needed for up to 12 doses for moderate constipation. 12/15/22   Terald Sleeper, MD  GAVILYTE-G 236 g solution Take 4,000 mLs by mouth as directed. Patient not taking: Reported on 01/15/2023 12/10/22   [provider]  lactulose (CHRONULAC) 10 GM/15ML solution Take 15-30 mLs (10-20 g total) by mouth 2 (two) times daily as needed for  moderate constipation or severe constipation. 01/15/23   Borders, Daryl Eastern, NP  magnesium hydroxide (MILK OF MAGNESIA) 400 MG/5ML suspension Take 5 mLs by mouth daily as needed for mild constipation.    [provider]  metFORMIN (GLUCOPHAGE) 500 MG tablet Take by mouth. 09/18/22   [provider]  ondansetron (ZOFRAN-ODT) 8 MG disintegrating tablet Take 1 tablet (8 mg total) by mouth every 8 (eight) hours as needed for nausea. 01/10/23   Derwood Kaplan, MD  oxyCODONE (ROXICODONE) 5 MG immediate release tablet Take 1 tablet (5 mg total) by mouth every 8 (eight) hours as needed for up to 16 doses for breakthrough pain or severe pain. 01/07/23   Pilar Jarvis, MD  pantoprazole (PROTONIX) 40 MG tablet Take 1 tablet (40 mg total) by mouth daily. 01/07/23 04/07/23  Pilar Jarvis, MD  polyethylene glycol (MIRALAX / GLYCOLAX) 17 g packet Take 17 g by mouth daily.    [provider]  potassium chloride SA (KLOR-CON M) 20 MEQ tablet TAKE 2 TABLETS BY MOUTH DAILY FOR POTASSIUM 10/25/22   [provider]  pravastatin (PRAVACHOL) 20 MG tablet Take 20 mg by mouth daily. 02/06/21   [provider]  senna-docusate (SENOKOT-S) 8.6-50 MG tablet Take 1 tablet by mouth daily as needed for up to 30 doses for mild constipation. 12/15/22   Terald Sleeper, MD  tamsulosin (FLOMAX) 0.4 MG CAPS capsule Take 1 capsule by mouth daily. 08/21/22   [provider]    Physical Exam: Vitals:   01/18/23 0956 01/18/23 1425  BP: 103/67 128/66  Pulse: (!) 104 98  Resp: 18 17  Temp: 98.3 F (36.8 C) 98.4 F (36.9 C)  TempSrc:  Oral  SpO2: 97% 97%    Constitutional: NAD, AAOx3 HEENT: conjunctivae and lids normal, EOMI CV: No cyanosis.   RESP: normal respiratory effort, on RA Neuro: II - XII grossly intact.   Psych: Normal mood and affect.  Appropriate judgement and reason  Labs on Admission: I have personally reviewed labs and imaging studies  Time spent: 55 minutes  Darlin Priestly  MD [facility]  If 7PM-7AM, please contact night-coverage 01/18/2023, 5:20 PM

## 2023-01-18 NOTE — ED Notes (Signed)
Assessed pt foley bag for urine output. Pt has not voided.

## 2023-01-18 NOTE — ED Notes (Addendum)
Emptied and changed pts foley bag in order to get a clean catch urine sample.

## 2023-01-18 NOTE — ED Notes (Signed)
Gave pt a warm blanket. Family at bedside.

## 2023-01-19 DIAGNOSIS — R1310 Dysphagia, unspecified: Secondary | ICD-10-CM | POA: Diagnosis not present

## 2023-01-19 DIAGNOSIS — Z7984 Long term (current) use of oral hypoglycemic drugs: Secondary | ICD-10-CM | POA: Diagnosis not present

## 2023-01-19 DIAGNOSIS — R6510 Systemic inflammatory response syndrome (SIRS) of non-infectious origin without acute organ dysfunction: Secondary | ICD-10-CM | POA: Diagnosis present

## 2023-01-19 DIAGNOSIS — D500 Iron deficiency anemia secondary to blood loss (chronic): Secondary | ICD-10-CM | POA: Diagnosis not present

## 2023-01-19 DIAGNOSIS — R1314 Dysphagia, pharyngoesophageal phase: Secondary | ICD-10-CM | POA: Diagnosis present

## 2023-01-19 DIAGNOSIS — D75839 Thrombocytosis, unspecified: Secondary | ICD-10-CM | POA: Diagnosis present

## 2023-01-19 DIAGNOSIS — D509 Iron deficiency anemia, unspecified: Secondary | ICD-10-CM | POA: Diagnosis present

## 2023-01-19 DIAGNOSIS — C779 Secondary and unspecified malignant neoplasm of lymph node, unspecified: Secondary | ICD-10-CM | POA: Diagnosis present

## 2023-01-19 DIAGNOSIS — R531 Weakness: Secondary | ICD-10-CM | POA: Diagnosis not present

## 2023-01-19 DIAGNOSIS — D72829 Elevated white blood cell count, unspecified: Secondary | ICD-10-CM | POA: Diagnosis present

## 2023-01-19 DIAGNOSIS — D72828 Other elevated white blood cell count: Secondary | ICD-10-CM | POA: Diagnosis not present

## 2023-01-19 DIAGNOSIS — E860 Dehydration: Secondary | ICD-10-CM | POA: Diagnosis present

## 2023-01-19 DIAGNOSIS — C787 Secondary malignant neoplasm of liver and intrahepatic bile duct: Secondary | ICD-10-CM | POA: Diagnosis present

## 2023-01-19 DIAGNOSIS — F17210 Nicotine dependence, cigarettes, uncomplicated: Secondary | ICD-10-CM | POA: Diagnosis present

## 2023-01-19 DIAGNOSIS — R059 Cough, unspecified: Secondary | ICD-10-CM | POA: Diagnosis present

## 2023-01-19 DIAGNOSIS — E876 Hypokalemia: Secondary | ICD-10-CM | POA: Diagnosis not present

## 2023-01-19 DIAGNOSIS — E871 Hypo-osmolality and hyponatremia: Secondary | ICD-10-CM | POA: Diagnosis present

## 2023-01-19 DIAGNOSIS — K5903 Drug induced constipation: Secondary | ICD-10-CM | POA: Diagnosis present

## 2023-01-19 DIAGNOSIS — C61 Malignant neoplasm of prostate: Secondary | ICD-10-CM | POA: Diagnosis present

## 2023-01-19 DIAGNOSIS — K5710 Diverticulosis of small intestine without perforation or abscess without bleeding: Secondary | ICD-10-CM | POA: Diagnosis present

## 2023-01-19 DIAGNOSIS — C7951 Secondary malignant neoplasm of bone: Secondary | ICD-10-CM | POA: Diagnosis present

## 2023-01-19 DIAGNOSIS — E440 Moderate protein-calorie malnutrition: Secondary | ICD-10-CM | POA: Diagnosis present

## 2023-01-19 DIAGNOSIS — I10 Essential (primary) hypertension: Secondary | ICD-10-CM | POA: Diagnosis present

## 2023-01-19 DIAGNOSIS — K219 Gastro-esophageal reflux disease without esophagitis: Secondary | ICD-10-CM | POA: Diagnosis present

## 2023-01-19 DIAGNOSIS — G4730 Sleep apnea, unspecified: Secondary | ICD-10-CM | POA: Diagnosis present

## 2023-01-19 DIAGNOSIS — R1319 Other dysphagia: Secondary | ICD-10-CM | POA: Diagnosis not present

## 2023-01-19 DIAGNOSIS — K224 Dyskinesia of esophagus: Secondary | ICD-10-CM | POA: Diagnosis present

## 2023-01-19 DIAGNOSIS — K920 Hematemesis: Secondary | ICD-10-CM | POA: Diagnosis present

## 2023-01-19 DIAGNOSIS — Z1152 Encounter for screening for COVID-19: Secondary | ICD-10-CM | POA: Diagnosis not present

## 2023-01-19 LAB — CBC
HCT: 23.6 % — ABNORMAL LOW (ref 39.0–52.0)
Hemoglobin: 8.1 g/dL — ABNORMAL LOW (ref 13.0–17.0)
MCH: 24.2 pg — ABNORMAL LOW (ref 26.0–34.0)
MCHC: 34.3 g/dL (ref 30.0–36.0)
MCV: 70.4 fL — ABNORMAL LOW (ref 80.0–100.0)
Platelets: 584 10*3/uL — ABNORMAL HIGH (ref 150–400)
RBC: 3.35 MIL/uL — ABNORMAL LOW (ref 4.22–5.81)
RDW: 16.3 % — ABNORMAL HIGH (ref 11.5–15.5)
WBC: 11.5 10*3/uL — ABNORMAL HIGH (ref 4.0–10.5)
nRBC: 0 % (ref 0.0–0.2)

## 2023-01-19 LAB — HIV ANTIBODY (ROUTINE TESTING W REFLEX): HIV Screen 4th Generation wRfx: NONREACTIVE

## 2023-01-19 LAB — BASIC METABOLIC PANEL
Anion gap: 13 (ref 5–15)
BUN: 10 mg/dL (ref 8–23)
CO2: 29 mmol/L (ref 22–32)
Calcium: 9 mg/dL (ref 8.9–10.3)
Chloride: 86 mmol/L — ABNORMAL LOW (ref 98–111)
Creatinine, Ser: 0.68 mg/dL (ref 0.61–1.24)
GFR, Estimated: >60 mL/min (ref >60)
Glucose, Bld: 166 mg/dL — ABNORMAL HIGH (ref 70–99)
Potassium: 3.3 mmol/L — ABNORMAL LOW (ref 3.5–5.1)
Sodium: 128 mmol/L — ABNORMAL LOW (ref 135–145)

## 2023-01-19 LAB — MAGNESIUM: Magnesium: 2.1 mg/dL (ref 1.7–2.4)

## 2023-01-19 MED ORDER — POLYETHYLENE GLYCOL 3350 17 G PO PACK
34.0000 g | PACK | ORAL | Status: AC
  Administered 2023-01-19 (×3): 34 g via ORAL
  Filled 2023-01-19 (×4): qty 2

## 2023-01-19 MED ORDER — PANTOPRAZOLE SODIUM 40 MG IV SOLR
40.0000 mg | Freq: Two times a day (BID) | INTRAVENOUS | Status: DC
  Administered 2023-01-19 – 2023-01-24 (×10): 40 mg via INTRAVENOUS
  Filled 2023-01-19 (×10): qty 10

## 2023-01-19 MED ORDER — MELATONIN 5 MG PO TABS
2.5000 mg | ORAL_TABLET | Freq: Every day | ORAL | Status: DC
  Administered 2023-01-19 – 2023-01-23 (×5): 2.5 mg via ORAL
  Filled 2023-01-19 (×5): qty 1

## 2023-01-19 MED ORDER — SODIUM CHLORIDE 0.9 % IV SOLN: INTRAVENOUS | Status: DC

## 2023-01-19 MED ORDER — CHLORHEXIDINE GLUCONATE CLOTH 2 % EX PADS
6.0000 | MEDICATED_PAD | Freq: Every day | CUTANEOUS | Status: DC
  Administered 2023-01-19 – 2023-01-24 (×6): 6 via TOPICAL

## 2023-01-19 NOTE — Progress Notes (Signed)
On call provider notification 2051  S:  patient request sleep aid.  B:  62 M-HTN, depression, sleep apnea, OA, and castrate resistant prostate cancer with metastatic to bone, liver, and lymph nodes. He presented to the [facility] ED 9/14 for chief complaint of generalized weakness, poor PO intake, nausea, and vomiting. He reports symptoms have been progressive over the past week few. He has been able to swallow liquids (Ensures, water, etc), but reports anything solid he will have to vomit back up.    A:  Has not taken anything prescribed or consistently in the past.  NKA. VSS  R: Provider discretion  2054-new order received  0028-Patient refused last scheduled dose of Miralax reporting emesis.  On call provider notified again in request of PRN N/V.  New orders received 0051.

## 2023-01-19 NOTE — Consult Note (Signed)
GI Inpatient Consult Note  Reason for Consult: Dysphagia   Attending Requesting Consult: Dr. Darlin Priestly, MD  History of Present Illness: Louis Gardner is a 62 y.o. male seen for evaluation of dysphagia at the request of admitting hospitalist - Dr. Darlin Priestly. Patient has a PMH of HTN, depression, sleep apnea, OA, and castrate resistant prostate cancer with metastatic to bone, liver, and lymph nodes. He presented to the [facility] ED 9/14 for chief complaint of generalized weakness, poor PO intake, nausea, and vomiting. He reports symptoms have been progressive over the past week few. He has been able to swallow liquids (Ensures, water, etc), but reports anything solid he will have to vomit back up. He has been losing weight the past two weeks. Upon arrival to the ED, he was mildly tachycardic (104) and otherwise stable vital signs. Labs notable for sodium 123, potassium 3.3, WBC 15.8K, hemoglobin 8.4. Chest x-ray commented on bilateral lower lung interstitial opacities which may represent atelectasis or infection. GI consulted for further evaluation and management. He denies any epigastric abdominal pain, odynophagia, early satiety, hoarseness, or refractory GERD symptoms. Prior to coming to the ED, he reports he saw specks of blood in his emesis and got really concerned. He reports he saw the blood after multiple episodes of non-bloody emesis and dry heaving. He does endorse esophageal dysphagia localized to just under the suprasternal notch. These issues are related to solid foods and not liquids. Of note, he reports he has been taking Aleve 400 mg quite frequently the past two weeks for musculoskeletal pains. He has been doing it multiple times a day. He denies any melena or hematochezia. He has been following with Oncology with Dr. Cathie Hoops. He has plans to start new chemotherapy next week. He has been struggling with opioid induced constipation with small bowel movements every 3-4 days. He was recently started  on Lactulose. He was supposed to have a colonoscopy last month with Dr. Tobi Bastos, but had poor prep so this has been rescheduled to next month.    Past Medical History:  Past Medical History:  Diagnosis Date   Arthritis    Depression    GI bleeding    Hypertension    Pancreatic cancer [facility])    Sleep apnea     Problem List: Patient Active Problem List   Diagnosis Date Noted   Dysphagia 01/18/2023   Anemia 01/13/2023   Prostate cancer metastatic to bone (HCC) 11/27/2022   Screening for colon cancer 11/27/2022   Bladder outlet obstruction 11/27/2022    Past Surgical History: Past Surgical History:  Procedure Laterality Date   COLONOSCOPY     COLONOSCOPY WITH PROPOFOL N/A 12/09/2022   Procedure: COLONOSCOPY WITH PROPOFOL;  Surgeon: Wyline Mood, MD;  Location: [facility] ENDOSCOPY;  Service: Gastroenterology;  Laterality: N/A;   ESOPHAGOGASTRODUODENOSCOPY (EGD) WITH PROPOFOL N/A 03/19/2016   Procedure: ESOPHAGOGASTRODUODENOSCOPY (EGD) WITH PROPOFOL;  Surgeon: Christena Deem, MD;  Location: [facility] ENDOSCOPY;  Service: Endoscopy;  Laterality: N/A;    Allergies: No Known Allergies  Home Medications: Medications Prior to Admission  Medication Sig Dispense Refill Last Dose   amLODipine (NORVASC) 10 MG tablet Take 10 mg by mouth daily.   01/17/2023   bisacodyl (DULCOLAX) 10 MG suppository Place 1 suppository (10 mg total) rectally daily as needed for up to 12 doses for moderate constipation. 12 suppository 0 prn at unknown   JARDIANCE 10 MG TABS tablet Take 10 mg by mouth daily.   01/17/2023   lactulose (CHRONULAC) 10  GM/15ML solution Take 15-30 mLs (10-20 g total) by mouth 2 (two) times daily as needed for moderate constipation or severe constipation. 237 mL 0 prn at unknown   magnesium hydroxide (MILK OF MAGNESIA) 400 MG/5ML suspension Take 5 mLs by mouth daily as needed for mild constipation.   prn at unknown   metFORMIN (GLUCOPHAGE) 500 MG tablet Take 1,000 mg by mouth 2 (two) times daily with a  meal.   01/17/2023   ondansetron (ZOFRAN-ODT) 8 MG disintegrating tablet Take 1 tablet (8 mg total) by mouth every 8 (eight) hours as needed for nausea. 20 tablet 0 prn at unknown   oxyCODONE (ROXICODONE) 5 MG immediate release tablet Take 1 tablet (5 mg total) by mouth every 8 (eight) hours as needed for up to 16 doses for breakthrough pain or severe pain. 12 tablet 0 prn at unknown   pantoprazole (PROTONIX) 40 MG tablet Take 1 tablet (40 mg total) by mouth daily. 30 tablet 2 01/17/2023   polyethylene glycol (MIRALAX / GLYCOLAX) 17 g packet Take 17 g by mouth daily.   prn at unknown   potassium chloride SA (KLOR-CON M) 20 MEQ tablet TAKE 2 TABLETS BY MOUTH DAILY FOR POTASSIUM   prn at unknown   pravastatin (PRAVACHOL) 20 MG tablet Take 20 mg by mouth daily.   01/17/2023   senna-docusate (SENOKOT-S) 8.6-50 MG tablet Take 1 tablet by mouth daily as needed for up to 30 doses for mild constipation. 30 tablet 0 prn at unknown   tamsulosin (FLOMAX) 0.4 MG CAPS capsule Take 1 capsule by mouth daily.   Past Week   GAVILYTE-G 236 g solution Take 4,000 mLs by mouth as directed. (Patient not taking: Reported on 01/15/2023)      Home medication reconciliation was completed with the patient.   Scheduled Inpatient Medications:    enoxaparin (LOVENOX) injection  40 mg Subcutaneous Q24H   feeding supplement  237 mL Oral TID BM   tamsulosin  0.4 mg Oral Daily    Continuous Inpatient Infusions:    PRN Inpatient Medications:    Family History: family history is not on file.  The patient's family history is negative for inflammatory bowel disorders, GI malignancy, or solid organ transplantation.  Social History:   reports that he has been smoking cigarettes. He has a 12 pack-year smoking history. He has been exposed to tobacco smoke. He has never used smokeless tobacco. He reports current alcohol use of about 6.0 standard drinks of alcohol per week. He reports that he does not use drugs. The patient denies  ETOH, tobacco, or drug use.   Review of Systems: Constitutional: Weight is stable.  Eyes: No changes in vision. ENT: No oral lesions, sore throat.  GI: see HPI.  Heme/Lymph: No easy bruising.  CV: No chest pain.  GU: No hematuria.  Integumentary: No rashes.  Neuro: No headaches.  Psych: No depression/anxiety.  Endocrine: No heat/cold intolerance.  Allergic/Immunologic: No urticaria.  Resp: No cough, SOB.  Musculoskeletal: No joint swelling.    Physical Examination: BP 110/75 (BP Location: Right Arm)   Pulse 88   Temp 98.1 F (36.7 C)   Resp 17   Ht 5\' 9"  (1.753 m)   Wt 64.4 kg   SpO2 99%   BMI 20.97 kg/m  Gen: NAD, alert and oriented x 4 HEENT: PEERLA, EOMI, Neck: supple, no JVD or thyromegaly Chest: CTA bilaterally, no wheezes, crackles, or other adventitious sounds CV: RRR, no m/g/c/r Abd: soft, NT, ND, +BS in all four quadrants;  no HSM, guarding, ridigity, or rebound tenderness Ext: no edema, well perfused with 2+ pulses, Skin: no rash or lesions noted Lymph: no LAD  Data: Lab Results  Component Value Date   WBC 11.5 (H) 01/19/2023   HGB 8.1 (L) 01/19/2023   HCT 23.6 (L) 01/19/2023   MCV 70.4 (L) 01/19/2023   PLT 584 (H) 01/19/2023   Recent Labs  Lab 01/15/23 1035 01/18/23 0952 01/19/23 0450  HGB 8.9* 8.4* 8.1*   Lab Results  Component Value Date   NA 128 (L) 01/19/2023   K 3.3 (L) 01/19/2023   CL 86 (L) 01/19/2023   CO2 29 01/19/2023   BUN 10 01/19/2023   CREATININE 0.68 01/19/2023   Lab Results  Component Value Date   ALT 8 01/18/2023   AST 17 01/18/2023   ALKPHOS 125 01/18/2023   BILITOT 1.1 01/18/2023   No results for input(s): "APTT", "INR", "PTT" in the last 168 hours.  Assessment/Plan:  62 y/o AA male with a PMH of HTN, depression, sleep apnea, OA, and castrate resistant prostate cancer with metastatic to bone, liver, and lymph nodes presented to the [facility] ED 9/14 for chief complaint of generalized weakness and inability to swallow  solid food. GI consulted for further evaluation and management.   Esophageal dysphagia - DDx includes infectious esophagitis, esophageal stricture, Schatzki's ring, PUD, gastritis +/- H pylori +/- overuse of NSAIDs, duodenitis, esophageal dysmotility, etc  Hematemesis - suspect 2/2 self-limited Mallory-Weiss tear, H&H stable with no clinical concern for UGIB  Poor PO intake  Acute on chronic hyponatremia (128)  Hypokalemia (3.3)  Metastatic prostate cancer with mets to bone, liver, and lymph nodes  Chronic constipation - likely 2/2 OIC  Recommendations:  - Continue supportive care with antiemetics, IV fluid hydration, and pain control per primary team - H&H stable with no concerns of GI bleeding - Continue Protonix for gastric protection - Okay for ice chips and liquids as tolerated - Plan for upper GI series and consider EGD if negative  - Appreciate palliative/oncology recommendations  - GI following along with you   Thank you for the consult. Please call with questions or concerns.  Gilda Crease, PA-C [facility] [phone]

## 2023-01-19 NOTE — Progress Notes (Signed)
PROGRESS NOTE    Louis Gardner  NGE:[id number] DOB: 06-26-1960 DOA: 01/18/2023 PCP: [facility]  113A/113A-AA  LOS: 0 days   Brief hospital course:   Assessment & Plan: Louis Gardner is a 62 y.o. 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  Reportedly specks of blood in vomitus. Plan: --GI consult today --Full liquid diet --Ensure TID --upper GI series and consider EGD if negative    Hyponatremia, acute on chronic --does have baseline chronic hyponatremia dating back to 2021.  Na 123 on presentation. --urine osm and Na studies equivocal --monitor     Prostate cancer with mets --being followed by Oncology Dr. Cathie Gardner and NP [facility]. --will consult on Monday   Chronic constipation --cont aggressive Miralax   Chronic Foley --pt said Foley is for his prostate cancer.  Foley exchanged in the ED. --cont Flomax   Hypokalemia --monitor and supplement PRN   Microcytic anemia --Hgb 8's, stable --monitor   Leukocytosis --no obvious signs of infection.   --Hold abx   DVT prophylaxis: Lovenox SQ Code Status: Full code  Family Communication:  Level of care: Med-Surg Dispo:   The patient is from: home Anticipated d/c is to: home Anticipated d/c date is: 1-2 days   Subjective and Interval History:  No new complaint today.   Objective: Vitals:   01/18/23 2145 01/19/23 0358 01/19/23 0736 01/19/23 1716  BP:  114/72 110/75 123/72  Pulse:  93 88 90  Resp:  17 17   Temp:  98.2 F (36.8 C) 98.1 F (36.7 C) 99.1 F (37.3 C)  TempSrc:  Oral    SpO2:  98% 99% 99%  Weight: 64.4 kg     Height: 5\' 9"  (1.753 m)       Intake/Output Summary (Last 24 hours) at 01/19/2023 1807 Last data filed at 01/19/2023 1716 Gross per 24 hour  Intake 279 ml  Output --  Net 279 ml   Filed Weights   01/18/23 2145  Weight: 64.4 kg     Examination:   Constitutional: NAD, AAOx3 HEENT: conjunctivae and lids normal, EOMI CV: No cyanosis.   RESP: normal respiratory effort, on RA Neuro: II - XII grossly intact.   Psych: Normal mood and affect.  Appropriate judgement and reason   Data Reviewed: I have personally reviewed labs and imaging studies  Time spent: 35 minutes  Darlin Priestly, MD [facility] If 7PM-7AM, please contact night-coverage 01/19/2023, 6:07 PM

## 2023-01-19 NOTE — Plan of Care (Signed)

## 2023-01-20 ENCOUNTER — Ambulatory Visit: Payer: Medicaid Other | Admitting: Surgery

## 2023-01-20 ENCOUNTER — Inpatient Hospital Stay: Payer: Medicaid Other

## 2023-01-20 DIAGNOSIS — D500 Iron deficiency anemia secondary to blood loss (chronic): Secondary | ICD-10-CM | POA: Diagnosis not present

## 2023-01-20 DIAGNOSIS — E871 Hypo-osmolality and hyponatremia: Secondary | ICD-10-CM

## 2023-01-20 DIAGNOSIS — R6510 Systemic inflammatory response syndrome (SIRS) of non-infectious origin without acute organ dysfunction: Secondary | ICD-10-CM | POA: Diagnosis not present

## 2023-01-20 DIAGNOSIS — R1319 Other dysphagia: Secondary | ICD-10-CM | POA: Diagnosis not present

## 2023-01-20 LAB — CBC
HCT: 23.8 % — ABNORMAL LOW (ref 39.0–52.0)
Hemoglobin: 7.9 g/dL — ABNORMAL LOW (ref 13.0–17.0)
MCH: 23.2 pg — ABNORMAL LOW (ref 26.0–34.0)
MCHC: 33.2 g/dL (ref 30.0–36.0)
MCV: 70 fL — ABNORMAL LOW (ref 80.0–100.0)
Platelets: 651 10*3/uL — ABNORMAL HIGH (ref 150–400)
RBC: 3.4 MIL/uL — ABNORMAL LOW (ref 4.22–5.81)
RDW: 16.2 % — ABNORMAL HIGH (ref 11.5–15.5)
WBC: 10.6 10*3/uL — ABNORMAL HIGH (ref 4.0–10.5)
nRBC: 0.2 % (ref 0.0–0.2)

## 2023-01-20 LAB — RETIC PANEL
Immature Retic Fract: 31.2 % — ABNORMAL HIGH (ref 2.3–15.9)
RBC.: 3.4 MIL/uL — ABNORMAL LOW (ref 4.22–5.81)
Retic Count, Absolute: 65.3 10*3/uL (ref 19.0–186.0)
Retic Ct Pct: 1.9 % (ref 0.4–3.1)
Reticulocyte Hemoglobin: 21.1 pg — ABNORMAL LOW (ref >27.9)

## 2023-01-20 LAB — BASIC METABOLIC PANEL
Anion gap: 11 (ref 5–15)
BUN: 7 mg/dL — ABNORMAL LOW (ref 8–23)
CO2: 29 mmol/L (ref 22–32)
Calcium: 9.1 mg/dL (ref 8.9–10.3)
Chloride: 86 mmol/L — ABNORMAL LOW (ref 98–111)
Creatinine, Ser: 0.68 mg/dL (ref 0.61–1.24)
GFR, Estimated: >60 mL/min (ref >60)
Glucose, Bld: 192 mg/dL — ABNORMAL HIGH (ref 70–99)
Potassium: 3.3 mmol/L — ABNORMAL LOW (ref 3.5–5.1)
Sodium: 126 mmol/L — ABNORMAL LOW (ref 135–145)

## 2023-01-20 LAB — MAGNESIUM: Magnesium: 1.8 mg/dL (ref 1.7–2.4)

## 2023-01-20 MED ORDER — MORPHINE SULFATE (PF) 2 MG/ML IV SOLN
2.0000 mg | Freq: Once | INTRAVENOUS | Status: AC
  Administered 2023-01-20: 2 mg via INTRAVENOUS
  Filled 2023-01-20: qty 1

## 2023-01-20 MED ORDER — POTASSIUM CHLORIDE 10 MEQ/100ML IV SOLN
10.0000 meq | INTRAVENOUS | Status: AC
  Administered 2023-01-20 (×3): 10 meq via INTRAVENOUS
  Filled 2023-01-20: qty 100

## 2023-01-20 MED ORDER — ONDANSETRON HCL 4 MG/2ML IJ SOLN
4.0000 mg | Freq: Four times a day (QID) | INTRAMUSCULAR | Status: DC | PRN
  Administered 2023-01-20 – 2023-01-22 (×2): 4 mg via INTRAVENOUS
  Filled 2023-01-20 (×2): qty 2

## 2023-01-20 MED ORDER — OXYCODONE HCL 5 MG PO TABS
5.0000 mg | ORAL_TABLET | Freq: Three times a day (TID) | ORAL | Status: DC | PRN
  Administered 2023-01-20 – 2023-01-24 (×7): 5 mg via ORAL
  Filled 2023-01-20 (×8): qty 1

## 2023-01-20 MED ORDER — ONDANSETRON HCL 4 MG PO TABS: 4.0000 mg | ORAL_TABLET | Freq: Four times a day (QID) | ORAL | Status: DC | PRN

## 2023-01-20 MED ORDER — SODIUM CHLORIDE 0.9 % IV SOLN
200.0000 mg | Freq: Once | INTRAVENOUS | Status: AC
  Administered 2023-01-21: 200 mg via INTRAVENOUS
  Filled 2023-01-20: qty 200

## 2023-01-20 MED ORDER — SORBITOL 70 % SOLN
960.0000 mL | TOPICAL_OIL | Freq: Once | ORAL | Status: AC
  Administered 2023-01-20: 960 mL via RECTAL
  Filled 2023-01-20: qty 240

## 2023-01-20 MED ORDER — LACTULOSE 10 GM/15ML PO SOLN
30.0000 g | Freq: Two times a day (BID) | ORAL | Status: DC
  Administered 2023-01-20 – 2023-01-24 (×6): 30 g via ORAL
  Filled 2023-01-20 (×7): qty 60

## 2023-01-20 MED ORDER — SENNOSIDES-DOCUSATE SODIUM 8.6-50 MG PO TABS
1.0000 | ORAL_TABLET | Freq: Two times a day (BID) | ORAL | Status: DC
  Administered 2023-01-20 – 2023-01-24 (×8): 1 via ORAL
  Filled 2023-01-20 (×8): qty 1

## 2023-01-20 NOTE — Progress Notes (Signed)
Mobility Specialist - Progress Note   01/20/23 1042  Mobility  Activity Ambulated with assistance in hallway  Level of Assistance Contact guard assist, steadying assist  Assistive Device None  Distance Ambulated (ft) 180 ft  Activity Response Tolerated well  $Mobility charge 1 Mobility  Mobility Specialist Start Time (ACUTE ONLY) 1029  Mobility Specialist Stop Time (ACUTE ONLY) 1040  Mobility Specialist Time Calculation (min) (ACUTE ONLY) 11 min   Pt supine upon entry, utilizing RA. Pt agreeable to OOB amb in the hallway this date, denied use of AD. Pt completed bed mob and STS indep. Pt amb 180 ft in the hallway CGA, unsteady while holding onto the side railing-- one LOB when railing unavailable, MinA for correction. Pt returned to the room, left supine with alarm set and needs within reach. RN notified.  Zetta Bills Mobility Specialist 01/20/23 10:50 AM

## 2023-01-20 NOTE — Progress Notes (Signed)
GI Inpatient Follow-up Note  Subjective:  Patient seen in follow-up for nausea/vomiting and constipation. No acute events overnight. He reports issues with constipation and a lot of abdominal discomfort and bloating. He has not been passing as much gas in the last 12 hours. He reports he feels like he needs to have a BM but cannot pass one. He had small volume emesis episode this morning which was non-bloody. UGIS planned for this morning.   Scheduled Inpatient Medications:   Chlorhexidine Gluconate Cloth  6 each Topical Daily   enoxaparin (LOVENOX) injection  40 mg Subcutaneous Q24H   feeding supplement  237 mL Oral TID BM   melatonin  2.5 mg Oral QHS   pantoprazole (PROTONIX) IV  40 mg Intravenous Q12H   sorbitol, milk of mag, mineral oil, glycerin (SMOG) enema  960 mL Rectal Once   tamsulosin  0.4 mg Oral Daily    PRN Inpatient Medications:  ondansetron **OR** ondansetron (ZOFRAN) IV  Review of Systems: Constitutional: Weight is stable.  Eyes: No changes in vision. ENT: No oral lesions, sore throat.  GI: see HPI.  Heme/Lymph: No easy bruising.  CV: No chest pain.  GU: No hematuria.  Integumentary: No rashes.  Neuro: No headaches.  Psych: No depression/anxiety.  Endocrine: No heat/cold intolerance.  Allergic/Immunologic: No urticaria.  Resp: No cough, SOB.  Musculoskeletal: No joint swelling.    Physical Examination: BP 121/80 (BP Location: Right Arm)   Pulse 92   Temp 98 F (36.7 C) (Oral)   Resp 17   Ht 5\' 9"  (1.753 m)   Wt 64.4 kg   SpO2 98%   BMI 20.97 kg/m  Gen: NAD, alert and oriented x 4 HEENT: PEERLA, EOMI, Neck: supple, no JVD or thyromegaly Chest: CTA bilaterally, no wheezes, crackles, or other adventitious sounds CV: RRR, no m/g/c/r Abd: soft, NT, ND, +BS in all four quadrants; no HSM, guarding, ridigity, or rebound tenderness Ext: no edema, well perfused with 2+ pulses, Skin: no rash or lesions noted Lymph: no LAD  Data: Lab Results   Component Value Date   WBC 10.6 (H) 01/20/2023   HGB 7.9 (L) 01/20/2023   HCT 23.8 (L) 01/20/2023   MCV 70.0 (L) 01/20/2023   PLT 651 (H) 01/20/2023   Recent Labs  Lab 01/18/23 0952 01/19/23 0450 01/20/23 0327  HGB 8.4* 8.1* 7.9*   Lab Results  Component Value Date   NA 126 (L) 01/20/2023   K 3.3 (L) 01/20/2023   CL 86 (L) 01/20/2023   CO2 29 01/20/2023   BUN 7 (L) 01/20/2023   CREATININE 0.68 01/20/2023   Lab Results  Component Value Date   ALT 8 01/18/2023   AST 17 01/18/2023   ALKPHOS 125 01/18/2023   BILITOT 1.1 01/18/2023   No results for input(s): "APTT", "INR", "PTT" in the last 168 hours.  Assessment/Plan:  62 y/o AA male with a PMH of HTN, depression, sleep apnea, OA, and castrate resistant prostate cancer with metastatic to bone, liver, and lymph nodes presented to the [facility] ED 9/14 for chief complaint of generalized weakness and inability to swallow solid food.    Esophageal dysphagia - DDx includes infectious esophagitis, esophageal stricture, Schatzki's ring, PUD, gastritis +/- H pylori +/- overuse of NSAIDs, duodenitis, esophageal dysmotility, etc. Upper GI series planned for today.    Hematemesis - suspect 2/2 self-limited Mallory-Weiss tear, H&H stable with no clinical concern for UGIB. No overt bleeding.    Poor PO intake   Acute on chronic hyponatremia (  126)   Hypokalemia (3.3)   Metastatic prostate cancer with mets to bone, liver, and lymph nodes   Acute on chronic constipation - likely 2/2 OIC  Recommendations:  - SMOG enema as ordered for acute on chronic constipation this AM - Commence on Lactulose 30 mL BID for OIC - Monitor response to enema/bowel regimen  - Continue supportive care with antiemetics, IVF hydration, and pain control per primary team. Attempt to hold narcotics.  - Continue Protonix for gastric protection - Follow-up on results of UGIS with consideration of EGD if abnormal  - Continue full liquid diet - Appreciate  palliative/oncology recommendations  - GI following along with you   Please call with questions or concerns.    Jacob Moores, PA-C [facility] [phone]

## 2023-01-20 NOTE — Progress Notes (Signed)
PROGRESS NOTE    Louis Gardner  WUJ:[id number] DOB: 1960-10-05 DOA: 01/18/2023 PCP: [facility] [employer]  113A/113A-AA  LOS: 1 day   Brief hospital course:   Assessment & Plan: Louis Gardner is a 62 y.o. 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  Reportedly specks of blood in vomitus. --GI consulted Plan: --Full liquid diet --Ensure TID --upper GI series today and consider EGD if negative    Hyponatremia, acute on chronic --does have baseline chronic hyponatremia dating back to 2021.  Na 123 on presentation. --urine osm and Na studies equivocal --monitor     Prostate cancer with mets --being followed by Oncology Dr. Cathie Hoops and NP [facility]. --Onc consult today with Dr. Cathie Hoops --resume home oxycodone PRN   Chronic constipation --s/p aggressive Miralax --enema today   Chronic Foley --pt said Foley is for his prostate cancer.  Foley exchanged in the ED. --cont Flomax   Hypokalemia --monitor and supplement PRN   Microcytic anemia --Hgb 8's, stable --monitor   Leukocytosis --no obvious signs of infection.   --Hold abx   DVT prophylaxis: Lovenox SQ Code Status: Full code  Family Communication: sister updated at bedside today Level of care: Med-Surg Dispo:   The patient is from: home Anticipated d/c is to: home Anticipated d/c date is: 1-2 days   Subjective and Interval History:  Pt complained of back pain and abdominal tenderness.    Had 2 large BM after miralax and enema today.  Undersent UGI series today.   Objective: Vitals:   01/19/23 1716 01/19/23 1959 01/20/23 0459 01/20/23 0732  BP: 123/72 117/73 124/74 121/80  Pulse: 90 93 95 92  Resp:  18 18 17   Temp: 99.1 F (37.3 C) 98.2 F (36.8 C) 98.5 F (36.9 C) 98 F (36.7 C)  TempSrc:    Oral  SpO2: 99% 98% 100% 98%  Weight:      Height:         Intake/Output Summary (Last 24 hours) at 01/20/2023 1946 Last data filed at 01/20/2023 1458 Gross per 24 hour  Intake 0 ml  Output 700 ml  Net -700 ml   Filed Weights   01/18/23 2145  Weight: 64.4 kg    Examination:   Constitutional: NAD, AAOx3 HEENT: conjunctivae and lids normal, EOMI CV: No cyanosis.   RESP: normal respiratory effort, on RA Neuro: II - XII grossly intact.   Psych: Normal mood and affect.  Appropriate judgement and reason   Data Reviewed: I have personally reviewed labs and imaging studies  Time spent: 35 minutes  Darlin Priestly, MD [facility] If 7PM-7AM, please contact night-coverage 01/20/2023, 7:46 PM

## 2023-01-20 NOTE — Progress Notes (Signed)
Patient introduced to role of nurse navigator. Patient denies any SDOH needs at present. Pt states he uses [facility] for his PCP needs. Does not currently follow with anyone for his mental health. Laurette Schimke with palliative oncology team notified patient may be candidate for cerula care.

## 2023-01-20 NOTE — Consult Note (Signed)
Hematology/Oncology Consult note [phone] [phone]      Patient Care Team: [facility] as PCP - General Debbe Odea, MD as PCP - Cardiology (Cardiology)   Name of the patient: Louis Gardner  [id number]  Jul 24, 1960   REASON FOR COSULTATION:   History of presenting illness-  62 y.o. male with PMH listed at below who presents to ER for evaluation of generalized weakness, inability to swallow solid food, vomiting.  He reported specks of blood in his vomitus.  Hemoglobin was 8.1, MCV 70.4.  Platelet count 5 patient 84. GI has been consulted for esophageal dysphagia.  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 Amil Amen on 01/23/2023.  Patient no-showed to his appointment last week with me.  Patient drinks alcohol daily.  No Known Allergies  Patient Active Problem List   Diagnosis Date Noted   Prostate cancer metastatic to bone (HCC) 11/27/2022    Priority: High   Screening for colon cancer 11/27/2022    Priority: Low   Bladder outlet obstruction 11/27/2022    Priority: Low   Dysphagia 01/18/2023   Anemia 01/13/2023     Past Medical History:  Diagnosis Date   Arthritis    Depression    GI bleeding    Hypertension    Pancreatic cancer (HCC)    Sleep apnea      Past Surgical History:  Procedure Laterality Date   COLONOSCOPY     COLONOSCOPY WITH PROPOFOL N/A 12/09/2022   Procedure: COLONOSCOPY WITH PROPOFOL;  Surgeon: Wyline Mood, MD;  Location: [facility] ENDOSCOPY;  Service: Gastroenterology;  Laterality: N/A;   ESOPHAGOGASTRODUODENOSCOPY (EGD) WITH PROPOFOL N/A 03/19/2016   Procedure: ESOPHAGOGASTRODUODENOSCOPY (EGD) WITH PROPOFOL;  Surgeon: Christena Deem, MD;  Location: [facility] ENDOSCOPY;  Service: Endoscopy;  Laterality: N/A;    Social History   Socioeconomic  History   Marital status: Single    Spouse name: Not on file   Number of children: Not on file   Years of education: Not on file   Highest education level: Not on file  Occupational History   Not on file  Tobacco Use   Smoking status: Every Day    Current packs/day: 0.25    Average packs/day: 0.3 packs/day for 48.0 years (12.0 ttl pk-yrs)    Types: Cigarettes    Passive exposure: Current   Smokeless tobacco: Never  Vaping Use   Vaping status: Never Used  Substance and Sexual Activity   Alcohol use: Yes    Alcohol/week: 6.0 standard drinks of alcohol    Types: 6 Cans of beer per week   Drug use: No   Sexual activity: Not on file  Other Topics Concern   Not on file  Social History Narrative   Not on file   Social Determinants of Health   Financial Resource Strain: Low Risk  (11/27/2022)   Overall Financial Resource Strain (CARDIA)    Difficulty of Paying Living Expenses: Not very hard  Food Insecurity: No Food Insecurity (01/18/2023)   Hunger Vital Sign    Worried About Running Out of Food in the Last Year: Never true    Ran Out of Food in the Last Year: Never true  Recent Concern: Food Insecurity - Food Insecurity Present (11/27/2022)   Hunger Vital Sign    Worried About Running Out of Food in the Last Year: Sometimes true  Ran Out of Food in the Last Year: Never true  Transportation Needs: No Transportation Needs (01/18/2023)   PRAPARE - Administrator, Civil Service (Medical): No    Lack of Transportation (Non-Medical): No  Physical Activity: Not on file  Stress: No Stress Concern Present (11/27/2022)   [employer] of Occupational Health - Occupational Stress Questionnaire    Feeling of Stress : Only a little  Social Connections: Not on file  Intimate Partner Violence: Not At Risk (01/18/2023)   Humiliation, Afraid, Rape, and Kick questionnaire    Fear of Current or Ex-Partner: No    Emotionally Abused: No    Physically Abused: No    Sexually  Abused: No     History reviewed. No pertinent family history.   Current Facility-Administered Medications:    Chlorhexidine Gluconate Cloth 2 % PADS 6 each, 6 each, Topical, Daily, Darlin Priestly, MD, 6 each at 01/20/23 0835   enoxaparin (LOVENOX) injection 40 mg, 40 mg, Subcutaneous, Q24H, Darlin Priestly, MD, 40 mg at 01/19/23 1716   feeding supplement (ENSURE ENLIVE / ENSURE PLUS) liquid 237 mL, 237 mL, Oral, TID BM, Darlin Priestly, MD, 237 mL at 01/20/23 1320   lactulose (CHRONULAC) 10 GM/15ML solution 30 g, 30 g, Oral, BID, Croley, Granville M, PA-C, 30 g at 01/20/23 1131   melatonin tablet 2.5 mg, 2.5 mg, Oral, QHS, Lindajo Royal V, MD, 2.5 mg at 01/19/23 2141   ondansetron (ZOFRAN) tablet 4 mg, 4 mg, Oral, Q6H PRN **OR** ondansetron (ZOFRAN) injection 4 mg, 4 mg, Intravenous, Q6H PRN, Andris Baumann, MD, 4 mg at 01/20/23 0122   oxyCODONE (Oxy IR/ROXICODONE) immediate release tablet 5 mg, 5 mg, Oral, Q8H PRN, Darlin Priestly, MD, 5 mg at 01/20/23 1217   pantoprazole (PROTONIX) injection 40 mg, 40 mg, Intravenous, Q12H, Darlin Priestly, MD, 40 mg at 01/20/23 0834   senna-docusate (Senokot-S) tablet 1 tablet, 1 tablet, Oral, BID, Darlin Priestly, MD, 1 tablet at 01/20/23 1217   tamsulosin (FLOMAX) capsule 0.4 mg, 0.4 mg, Oral, Daily, Darlin Priestly, MD, 0.4 mg at 01/20/23 0834  Review of Systems - Oncology  PHYSICAL EXAM Vitals:   01/19/23 1716 01/19/23 1959 01/20/23 0459 01/20/23 0732  BP: 123/72 117/73 124/74 121/80  Pulse: 90 93 95 92  Resp:  18 18 17   Temp: 99.1 F (37.3 C) 98.2 F (36.8 C) 98.5 F (36.9 C) 98 F (36.7 C)  TempSrc:    Oral  SpO2: 99% 98% 100% 98%  Weight:      Height:       Physical Exam    LABORATORY STUDIES    Latest Ref Rng & Units 01/20/2023    3:27 AM 01/19/2023    4:50 AM 01/18/2023    9:52 AM  CBC  WBC 4.0 - 10.5 K/uL 10.6  11.5  15.8   Hemoglobin 13.0 - 17.0 g/dL 7.9  8.1  8.4   Hematocrit 39.0 - 52.0 % 23.8  23.6  25.0   Platelets 150 - 400 K/uL 651  584  616       Latest  Ref Rng & Units 01/20/2023    3:27 AM 01/19/2023    4:50 AM 01/18/2023    9:52 AM  CMP  Glucose 70 - 99 mg/dL 846  962  952   BUN 8 - 23 mg/dL 7  10  12    Creatinine 0.61 - 1.24 mg/dL 8.41  3.24  4.01   Sodium 135 - 145 mmol/L 126  128  123  Potassium 3.5 - 5.1 mmol/L 3.3  3.3  3.3   Chloride 98 - 111 mmol/L 86  86  83   CO2 22 - 32 mmol/L 29  29  27    Calcium 8.9 - 10.3 mg/dL 9.1  9.0  8.9   Total Protein 6.5 - 8.1 g/dL   8.3   Total Bilirubin 0.3 - 1.2 mg/dL   1.1   Alkaline Phos 38 - 126 U/L   125   AST 15 - 41 U/L   17   ALT 0 - 44 U/L   8      RADIOGRAPHIC STUDIES: I have personally reviewed the radiological images as listed and agreed with the findings in the report. DG Chest 2 View  Result Date: 01/18/2023 CLINICAL DATA:  Weakness and decreased appetite.  Hematemesis. EXAM: CHEST - 2 VIEW COMPARISON:  Chest radiograph dated 01/07/2023 FINDINGS: Normal lung volumes. Bilateral lower lung interstitial opacities. No pleural effusion or pneumothorax. The heart size and mediastinal contours are within normal limits. No acute osseous abnormality. IMPRESSION: Bilateral lower lung interstitial opacities, which may represent atelectasis or infection. Electronically Signed   By: Agustin Cree M.D.   On: 01/18/2023 14:09   DG Abd 1 View  Result Date: 01/15/2023 CLINICAL DATA:  abd pain/constipation, r/o obstructive gas pattern EXAM: ABDOMEN - 1 VIEW COMPARISON:  CT AP 01/07/23 FINDINGS: Nonobstructive bowel gas pattern. Large colonic stool burden. Pelvic phleboliths. Rounded density projecting over the L4-L5 vertebral body levels is nonspecific, but favored to represent patient's known fat containing umbilical hernia. Assessment of the sacrum is slightly limited due to overlying bowel gas. No acute osseous abnormality. IMPRESSION: Nonobstructive bowel gas pattern. Large colonic stool burden. Electronically Signed   By: Lorenza Cambridge M.D.   On: 01/15/2023 13:02   CT Angio Chest PE W/Cm &/Or Wo  Cm  Result Date: 01/07/2023 CLINICAL DATA:  pt reports since last night having left sided CP, nonradiating accomp by nausea; Abdominal pain, acute, nonlocalized History of metastatic prostate cancer. EXAM: CT ANGIOGRAPHY CHEST CT ABDOMEN AND PELVIS WITH CONTRAST TECHNIQUE: Multidetector CT imaging of the chest was performed using the standard protocol during bolus administration of intravenous contrast. Multiplanar CT image reconstructions and MIPs were obtained to evaluate the vascular anatomy. Multidetector CT imaging of the abdomen and pelvis was performed using the standard protocol during bolus administration of intravenous contrast. RADIATION DOSE REDUCTION: This exam was performed according to the departmental dose-optimization program which includes automated exposure control, adjustment of the mA and/or kV according to patient size and/or use of iterative reconstruction technique. CONTRAST:  OMNIPAQUE IOHEXOL 350 MG/ML SOLN COMPARISON:  Chest XR, 01/07/2023. CTA chest, 11/23/2022. CT AP, 03/26/2023. PET-CT, 12/18/2022. FINDINGS: CTA CHEST FINDINGS Cardiovascular: Satisfactory opacification of the pulmonary arteries to the segmental level. No segmental or larger pulmonary embolus. normal heart size. No pericardial effusion. Mediastinum/Nodes: Interval enlargement in mediastinal adenopathy, with representative prevascular and precarinal lymph nodes measuring approximately 2.5 x 1.7 cm and 2.5 x 3.8 cm (AP by transaxial) respectively. No enlarged axillary lymph nodes. Thyroid gland, trachea, and esophagus demonstrate no significant findings. Lungs/Pleura: Mild basilar atelectasis. Lungs are otherwise clear without focal consolidation, mass or suspicious pulmonary nodule. No pleural effusion or pneumothorax. Musculoskeletal: Mild gynecomastia. No acute chest wall abnormality. Multifocal sclerotic osseous lesions within the imaged thoracic spine. Review of the MIP images confirms the above findings. CT  ABDOMEN and PELVIS FINDINGS Hepatobiliary: Normal volume of liver and contour. Increased number and conspicuity of small, multifocal rounded and  hypodense lesions scattered within liver contracted gallbladder with dense gallstones. No gallbladder wall thickening, or biliary dilatation. Pancreas: Relatively similar size and appearance ~ 1 cm hypodense pancreatic cystic lesion at the uncinate. No pancreatic ductal dilatation or surrounding inflammatory changes. Spleen: Normal in size without focal abnormality. Adrenals/Urinary Tract: Adrenal glands are unremarkable. Kidneys are normal, without renal calculi, focal lesion, or hydronephrosis. Bladder is decompressed by Foley catheter. Stomach/Bowel: Stomach is within normal limits. Appendix is not definitively visualized. Nonobstructed small bowel. Nondilated colon. Severe burden of colonic diverticulosis, involving the descending and transverse colon. No evidence of bowel wall thickening, distention, or acute inflammatory change. Vascular/Lymphatic: No significant vascular findings are present. No enlarged abdominal or pelvic lymph nodes. Reproductive: Irregular nodular contour of prostate. Other: No abdominopelvic ascites or intraperitoneal free air. Postsurgical changes of prior laparotomy. Fat-containing, umbilical ventral hernia. Musculoskeletal: No acute osseous finding teams or displaced fracture. Multifocal osseous sclerotic lesions within the imaged pelvis and axial spine. Review of the MIP images confirms the above findings. IMPRESSION: Since CT CAP dated 12/15/2022; 1. No segmental or larger pulmonary embolus. 2. No acute abdominopelvic process. 3. Progression of metastatic disease, with enlargement in mediastinal adenopathy within chest, and increased number and conspicuity of small multifocal lesions within liver, as described in detail above. Electronically Signed   By: Roanna Banning M.D.   On: 01/07/2023 09:29   CT ABDOMEN PELVIS W CONTRAST  Result  Date: 01/07/2023 CLINICAL DATA:  pt reports since last night having left sided CP, nonradiating accomp by nausea; Abdominal pain, acute, nonlocalized History of metastatic prostate cancer. EXAM: CT ANGIOGRAPHY CHEST CT ABDOMEN AND PELVIS WITH CONTRAST TECHNIQUE: Multidetector CT imaging of the chest was performed using the standard protocol during bolus administration of intravenous contrast. Multiplanar CT image reconstructions and MIPs were obtained to evaluate the vascular anatomy. Multidetector CT imaging of the abdomen and pelvis was performed using the standard protocol during bolus administration of intravenous contrast. RADIATION DOSE REDUCTION: This exam was performed according to the departmental dose-optimization program which includes automated exposure control, adjustment of the mA and/or kV according to patient size and/or use of iterative reconstruction technique. CONTRAST:  OMNIPAQUE IOHEXOL 350 MG/ML SOLN COMPARISON:  Chest XR, 01/07/2023. CTA chest, 11/23/2022. CT AP, 03/26/2023. PET-CT, 12/18/2022. FINDINGS: CTA CHEST FINDINGS Cardiovascular: Satisfactory opacification of the pulmonary arteries to the segmental level. No segmental or larger pulmonary embolus. normal heart size. No pericardial effusion. Mediastinum/Nodes: Interval enlargement in mediastinal adenopathy, with representative prevascular and precarinal lymph nodes measuring approximately 2.5 x 1.7 cm and 2.5 x 3.8 cm (AP by transaxial) respectively. No enlarged axillary lymph nodes. Thyroid gland, trachea, and esophagus demonstrate no significant findings. Lungs/Pleura: Mild basilar atelectasis. Lungs are otherwise clear without focal consolidation, mass or suspicious pulmonary nodule. No pleural effusion or pneumothorax. Musculoskeletal: Mild gynecomastia. No acute chest wall abnormality. Multifocal sclerotic osseous lesions within the imaged thoracic spine. Review of the MIP images confirms the above findings. CT ABDOMEN and  PELVIS FINDINGS Hepatobiliary: Normal volume of liver and contour. Increased number and conspicuity of small, multifocal rounded and hypodense lesions scattered within liver contracted gallbladder with dense gallstones. No gallbladder wall thickening, or biliary dilatation. Pancreas: Relatively similar size and appearance ~ 1 cm hypodense pancreatic cystic lesion at the uncinate. No pancreatic ductal dilatation or surrounding inflammatory changes. Spleen: Normal in size without focal abnormality. Adrenals/Urinary Tract: Adrenal glands are unremarkable. Kidneys are normal, without renal calculi, focal lesion, or hydronephrosis. Bladder is decompressed by Foley catheter. Stomach/Bowel: Stomach is within  normal limits. Appendix is not definitively visualized. Nonobstructed small bowel. Nondilated colon. Severe burden of colonic diverticulosis, involving the descending and transverse colon. No evidence of bowel wall thickening, distention, or acute inflammatory change. Vascular/Lymphatic: No significant vascular findings are present. No enlarged abdominal or pelvic lymph nodes. Reproductive: Irregular nodular contour of prostate. Other: No abdominopelvic ascites or intraperitoneal free air. Postsurgical changes of prior laparotomy. Fat-containing, umbilical ventral hernia. Musculoskeletal: No acute osseous finding teams or displaced fracture. Multifocal osseous sclerotic lesions within the imaged pelvis and axial spine. Review of the MIP images confirms the above findings. IMPRESSION: Since CT CAP dated 12/15/2022; 1. No segmental or larger pulmonary embolus. 2. No acute abdominopelvic process. 3. Progression of metastatic disease, with enlargement in mediastinal adenopathy within chest, and increased number and conspicuity of small multifocal lesions within liver, as described in detail above. Electronically Signed   By: Roanna Banning M.D.   On: 01/07/2023 09:29   DG Chest Portable 1 View  Result Date:  01/07/2023 CLINICAL DATA:  Chest pain EXAM: PORTABLE CHEST 1 VIEW COMPARISON:  11/23/2022 FINDINGS: Artifact from EKG leads. Rounded density over the left mid lung is attributed to the anterior fifth rib based on prior CT, there is known prostate cancer and bone metastases. There is no edema, consolidation, effusion, or pneumothorax. Normal heart size and mediastinal contours. IMPRESSION: No active disease. Electronically Signed   By: Tiburcio Pea M.D.   On: 01/07/2023 07:41   NM Radiologist Eval And Mgmt  Result Date: 12/19/2022 EXAM: NEW PATIENT OFFICE VISIT CHIEF COMPLAINT: Castrate resistant metastatic prostate carcinoma. Progression on chemotherapy. Local recurrence in the pelvis and progressive skeletal metastasis. Current Pain Level: 1-10 HISTORY OF PRESENT ILLNESS: See epic note REVIEW OF SYSTEMS: See epic note PHYSICAL EXAMINATION: See epic note ASSESSMENT AND PLAN: Patient is adequate candidate Lu 177 PSMA therapy ( vipivotide tetraxetan). Patient demonstrates mild-to-moderate progression metastatic pelvic recurrence and skeletal disease identified on recent PSMA PET scan. Additionally patient's PSA is gradually increasing. Patient has demonstrated progression on androgen deprivation and taxane chemotherapy. Patient explained major and minor risks and benefits of therapy. Major benefit being progression-free survival. Major risk being myelosuppression and renal toxicity. Minor toxicity of xerostomia. All the patient's questions were answered. Patient accompanied by daughter who was also present for consult. Patient is scheduled for 6 treatments spaced 6 weeks apart. Recommend following up with oncologist for CBC and CMP 1 week prior to each treatment to assess safety of continuing with therapy. Electronically Signed   By: Genevive Bi M.D.   On: 12/19/2022 15:29   NM PET (PSMA) SKULL TO MID THIGH  Result Date: 12/19/2022 CLINICAL DATA:  Prostate cancer with bone metastasis. Elevated PSA equal  117 and rising. EXAM: NUCLEAR MEDICINE PET SKULL BASE TO THIGH TECHNIQUE: 9.24 mCi F18 Piflufolastat (Pylarify) was injected intravenously. Full-ring PET imaging was performed from the skull base to thigh after the radiotracer. CT data was obtained and used for attenuation correction and anatomic localization. COMPARISON:  None Available. FINDINGS: NECK No radiotracer activity in neck lymph nodes. Incidental CT finding: None. CHEST No radiotracer accumulation within mediastinal or hilar lymph nodes. No suspicious pulmonary nodules on the CT scan. Incidental CT finding: None. ABDOMEN/PELVIS Prostate: Mass at the base of the LEFT similar vesicle measures 3.2 x 2.9 cm with intense radiotracer activity (SUV max equal 5.0). Minimal activity within the prostate gland. Foley catheter within the prosthetic urethra extending into bladder Lymph nodes: No abnormal radiotracer accumulation within pelvic or abdominal nodes. Liver:  No evidence of liver metastasis. Incidental CT finding: Foley catheter in place. SKELETON Multiple foci of moderately intense radiotracer activity scattered throughout the pelvis and spine. underlying more diffuse pattern of sclerotic metastasis the majority which not have metabolic activity. For example small lesion in the posterior LEFT iliac bone with SUV max equal 6.1 (image 144). Lesion in the L2 vertebral body with SUV max equal 9.4 on image 133. More mild activity associated sclerotic activity in the L1 vertebral body with SUV max equal 3.4. Several rib lesions with radiotracer activity. For example posterior RIGHT rib lesion with SUV max equal 4.3 on image90. Activity within these skeletal lesions is moderately elevated above liver activity (SUV max equal 3.8). IMPRESSION: 1. Radiotracer avid mass at the base of the RIGHT seminal vesicle is consistent with extra prostatic prostate carcinoma. 2. Minimal activity within the prostate gland . Foley catheter in place 3. Multifocal sclerotic skeletal  metastasis. The majority of the sclerotic metastasis have minimal radiotracer activity. Several discrete lesions have intense radiotracer activity. Activity is greater than liver moderately activity. Electronically Signed   By: Genevive Bi M.D.   On: 12/19/2022 14:38   CT ABDOMEN PELVIS W CONTRAST  Result Date: 12/15/2022 CLINICAL DATA:  History of metastatic prostate cancer with abdominal distention, nausea, and vomiting with absence of bowel movement in over a week. * Tracking Code: BO * EXAM: CT ABDOMEN AND PELVIS WITH CONTRAST TECHNIQUE: Multidetector CT imaging of the abdomen and pelvis was performed using the standard protocol following bolus administration of intravenous contrast. RADIATION DOSE REDUCTION: This exam was performed according to the departmental dose-optimization program which includes automated exposure control, adjustment of the mA and/or kV according to patient size and/or use of iterative reconstruction technique. CONTRAST:  OMNIPAQUE IOHEXOL 300 MG/ML  SOLN COMPARISON:  CTA chest dated 11/23/2022, CT abdomen and pelvis dated 05/28/2019 FINDINGS: Lower chest: Scattered ground-glass and nodular opacities are again seen in the right middle and bilateral lower lobes. No pleural effusion or pneumothorax demonstrated. Partially imaged heart size is normal. Hepatobiliary: Subcentimeter hypodensities in segment 2 (2:13, 17), too small to characterize. No intra or extrahepatic biliary ductal dilation. Cholelithiasis. Pancreas: Marked interval decrease in size of hypoattenuating cystic focus within the uncinate process, now measuring 0.9 x 0.6 cm (2:34), previously up to 6.1 x 4.7 cm. A 7 mm focus of adjacent enhancement (2:34) was likely present on 05/28/2019. No main ductal dilation. Spleen: Normal in size without focal abnormality. Adrenals/Urinary Tract: No adrenal nodules. No suspicious renal mass, calculi or hydronephrosis. Urinary bladder is decompressed with catheter in-situ.  Layering intraluminal hyperattenuation. Stomach/Bowel: Normal appearance of the stomach. No abnormal bowel wall thickening. Dilated, stool-filled rectum. Moderate to large volume stool within the remainder of the colon. Colonic diverticulosis without acute diverticulitis. Normal appendix. Vascular/Lymphatic: No significant vascular findings are present. No enlarged abdominal or pelvic lymph nodes. Reproductive: Marked interval decrease in size of the prostate gland, which demonstrates heterogeneous enhancement and lobulated contour. Other: No free fluid, fluid collection, or free air. Musculoskeletal: Diffuse heterogeneously sclerotic appearance of the bone marrow. Postsurgical changes of the anterior abdominal wall. Small fat-containing paraumbilical hernia. IMPRESSION: 1. Dilated, stool-filled rectum with moderate to large volume stool within the remainder of the colon, consistent with constipation. 2. Marked interval decrease in size of the prostate gland, which demonstrates heterogeneous enhancement and lobulated contour, likely reflecting treatment response. 3. Diffuse heterogeneously sclerotic appearance of the bone marrow, consistent with known osseous metastases. 4. Marked interval decrease in size of hypoattenuating  cystic focus within the pancreatic uncinate process. Recommend continued attention on follow-up. 5. Subcentimeter hepatic hypodensities in segment 2 are too small to characterize. Attention on follow-up. 6. Scattered ground-glass and nodular opacities are again seen in the right middle and bilateral lower lobes, which may represent sequela of infection or inflammation. 7. Cholelithiasis. 8. Colonic diverticulosis without acute diverticulitis. Electronically Signed   By: Agustin Cree M.D.   On: 12/15/2022 18:12   ECHOCARDIOGRAM COMPLETE  Result Date: 11/28/2022    ECHOCARDIOGRAM REPORT   Patient Name:   ITAY AVELLINO Date of Exam: 11/28/2022 Medical Rec #:  [id number]        Height:       69.0  in Accession #:    [id number]       Weight:       147.4 lb Date of Birth:  04/01/1961        BSA:          1.815 m Patient Age:    62 years         BP:           118/82 mmHg Patient Gender: M                HR:           82 bpm. Exam Location:  [place] Procedure: 2D Echo, 3D Echo, Color Doppler, Strain Analysis and Cardiac Doppler Indications:    R07.89 Other chest pain; R07.9* Chest pain, unspecified  History:        Patient has no prior history of Echocardiogram examinations.                 Signs/Symptoms:Chest Pain; Risk Factors:Former Smoker,                 Hypertension and Sleep Apnea.  Sonographer:    Ilda Mori RDCS, MHA Referring Phys: [id number] Debbe Odea  Sonographer Comments: Global longitudinal strain was attempted. IMPRESSIONS  1. Left ventricular ejection fraction, by estimation, is 60 to 65%. The left ventricle has normal function. The left ventricle has no regional wall motion abnormalities. Left ventricular diastolic parameters are consistent with Grade I diastolic dysfunction (impaired relaxation). The average left ventricular global longitudinal strain is -21.1 %.  2. Right ventricular systolic function is normal. The right ventricular size is normal.  3. The mitral valve is normal in structure. Mild mitral valve regurgitation. No evidence of mitral stenosis.  4. Tricuspid valve regurgitation is mild to moderate.  5. The aortic valve is tricuspid. Aortic valve regurgitation is not visualized. No aortic stenosis is present.  6. The inferior vena cava is normal in size with greater than 50% respiratory variability, suggesting right atrial pressure of 3 mmHg. FINDINGS  Left Ventricle: Left ventricular ejection fraction, by estimation, is 60 to 65%. The left ventricle has normal function. The left ventricle has no regional wall motion abnormalities. The average left ventricular global longitudinal strain is -21.1 %. The left ventricular internal cavity size was normal in size. There is  no left ventricular hypertrophy. Left ventricular diastolic parameters are consistent with Grade I diastolic dysfunction (impaired relaxation). Right Ventricle: The right ventricular size is normal. No increase in right ventricular wall thickness. Right ventricular systolic function is normal. Left Atrium: Left atrial size was normal in size. Right Atrium: Right atrial size was normal in size. Pericardium: There is no evidence of pericardial effusion. Mitral Valve: The mitral valve is normal in structure. Mild mitral valve regurgitation. No evidence of mitral  valve stenosis. Tricuspid Valve: The tricuspid valve is normal in structure. Tricuspid valve regurgitation is mild to moderate. No evidence of tricuspid stenosis. Aortic Valve: The aortic valve is tricuspid. Aortic valve regurgitation is not visualized. No aortic stenosis is present. Pulmonic Valve: The pulmonic valve was normal in structure. Pulmonic valve regurgitation is not visualized. No evidence of pulmonic stenosis. Aorta: The aortic root is normal in size and structure. Venous: The inferior vena cava is normal in size with greater than 50% respiratory variability, suggesting right atrial pressure of 3 mmHg. IAS/Shunts: No atrial level shunt detected by color flow Doppler.  LEFT VENTRICLE PLAX 2D LVIDd:         3.70 cm Diastology LVIDs:         2.60 cm LV e' medial:    10.30 cm/s LV PW:         1.00 cm LV E/e' medial:  6.2 LV IVS:        1.00 cm LV e' lateral:   8.38 cm/s                        LV E/e' lateral: 7.6                         2D Longitudinal Strain                        2D Strain GLS Avg:     -21.1 %                         3D Volume EF:                        3D EF:        58 %                        LV EDV:       73 ml                        LV ESV:       31 ml                        LV SV:        42 ml RIGHT VENTRICLE RV Basal diam:  3.30 cm RV Mid diam:    2.80 cm RV S prime:     17.10 cm/s TAPSE (M-mode): 1.6 cm LEFT ATRIUM              Index        RIGHT ATRIUM           Index LA diam:        3.20 cm 1.76 cm/m   RA Area:     13.80 cm LA Vol (A2C):   31.2 ml 17.19 ml/m  RA Volume:   33.60 ml  18.52 ml/m LA Vol (A4C):   40.6 ml 22.37 ml/m LA Biplane Vol: 35.2 ml 19.40 ml/m   AORTA Ao Root diam: 2.60 cm MITRAL VALVE               TRICUSPID VALVE MV Area (PHT): 3.03 cm    TR Peak grad:   19.0 mmHg MV Decel Time: 250 msec    TR Vmax:  218.00 cm/s MV E velocity: 63.40 cm/s MV A velocity: 70.20 cm/s MV E/A ratio:  0.90 Julien Nordmann MD Electronically signed by Julien Nordmann MD Signature Date/Time: 11/28/2022/6:55:51 PM    Final    CT Angio Chest PE W and/or Wo Contrast  Result Date: 11/23/2022 CLINICAL DATA:  High probability for acute pulmonary embolism. Complains of intermittent left-sided chest pain for 2 days. EXAM: CT ANGIOGRAPHY CHEST WITH CONTRAST TECHNIQUE: Multidetector CT imaging of the chest was performed using the standard protocol during bolus administration of intravenous contrast. Multiplanar CT image reconstructions and MIPs were obtained to evaluate the vascular anatomy. RADIATION DOSE REDUCTION: This exam was performed according to the departmental dose-optimization program which includes automated exposure control, adjustment of the mA and/or kV according to patient size and/or use of iterative reconstruction technique. CONTRAST:  75mL OMNIPAQUE IOHEXOL 350 MG/ML SOLN COMPARISON:  None Available. FINDINGS: Cardiovascular: Satisfactory opacification of the pulmonary arteries to the segmental level. No evidence of pulmonary embolism. Normal heart size. No pericardial effusion. Mild aortic atherosclerotic calcifications. Mediastinum/Nodes: Prominent mediastinal and hilar lymph nodes are identified. Right hilar node measures 1.1 cm. The right paratracheal lymph node measures 1.3 cm, image 61/4. Thyroid gland, trachea, and esophagus are unremarkable. Lungs/Pleura: No pleural effusion, airspace consolidation, atelectasis,  or pneumothorax. There is a focal area of bronchiectasis with adjacent peripheral tree-in-bud nodularity in the right upper lobe, image 56/6. Irregular subpleural nodule in the anterior left upper lobe measures 4 mm. Subpleural nodule in the periphery of the right lower lobe measures 4 mm, image 125/6. Upper Abdomen: No acute abnormality. Bilateral adrenal gland hyperplasia. Gallstones. Musculoskeletal: The bones appear diffusely abnormal with multifocal areas of abnormal increased sclerosis noted throughout the thoracic spine, sternum and ribs concerning for diffuse osseous metastasis. Review of the MIP images confirms the above findings. IMPRESSION: 1. No evidence for acute pulmonary embolus. 2. Diffusely abnormal bones with multifocal areas of abnormal increased sclerosis noted throughout the thoracic spine, sternum and ribs concerning for diffuse osseous metastasis. 3. Prominent mediastinal and hilar lymph nodes are identified. These are nonspecific and may be reactive. Nodal metastasis not excluded. 4. Focal area of bronchiectasis with adjacent peripheral tree-in-bud nodularity in the right upper lobe. This is favored to represent sequelae of prior infection or inflammation. 5. Bilateral adrenal gland hyperplasia. 6. Cholelithiasis. 7. Irregular subpleural nodule in the anterior left upper lobe measures 4 mm. 8.  Aortic Atherosclerosis (ICD10-I70.0). Electronically Signed   By: Signa Kell M.D.   On: 11/23/2022 15:57   DG Chest 2 View  Result Date: 11/23/2022 CLINICAL DATA:  Chest Pain EXAM: CHEST - 2 VIEW COMPARISON:  None Available. FINDINGS: No pleural effusion no pneumothorax. No focal airspace opacity. Normal cardiac and mediastinal contours. No radiographically apparent displaced rib fractures. Visualized upper abdomen is unremarkable. Vertebral body heights are maintained. IMPRESSION: No focal airspace opacity. Electronically Signed   By: Lorenza Cambridge M.D.   On: 11/23/2022 13:50     Assessment  and plan-   # Acute anemia, hemoglobin dropped from 11.1 in July. He has very mild hematemesis, which is not proportionate to the hemoglobin drop. Chronic microcytic, iron panel done in July 2024 as well as 01/15/2023 showed significant elevated ferritin, decreased iron saturation.  Decreased TIBC, Reticulocyte panel is decreased, microcytosis with thrombocytosis.  Possible iron deficiency anemia.  Ferritin elevation may be secondary to acute phase reactant Recommend a trial of iron infusion see if that will help with stabilizing hemoglobin-ordered.  Appreciate GI workup to rule out any  occult GI blood loss. Other etiology includes marrow involvement by widely metastatic prostate cancer.  If GI workup is negative, could consider bone marrow biopsy for further evaluation. Please transfuse PRBC if hemoglobin drops below 7 or if he is symptomatic.  # Hyponatremia, acute on chronic probably secondary to dehydration as well as chronic alcohol use.  # Prostate cancer with metastasis. Pending Pluvicto treatments on 9/19 for Ms. to be delayed if he is still hospitalized. Chronic urinary outlet obstruction, with indwelling Foley catheter.  Tube was exchanged in ED.  Thank you for allowing me to participate in the care of this patient.   Rickard Patience, MD, PhD Hematology Oncology 01/20/2023

## 2023-01-20 NOTE — Progress Notes (Signed)
Initial Nutrition Assessment  DOCUMENTATION CODES:   Non-severe (moderate) malnutrition in context of chronic illness  INTERVENTION:   -MVI with minerals daily -Ensure Enlive po TID, each supplement provides 350 kcal and 20 grams of protein -Magic cup TID with meals, each supplement provides 290 kcal and 9 grams of protein  -RD will follow for diet advancement and add supplements as appropriate  NUTRITION DIAGNOSIS:   Moderate Malnutrition related to chronic illness (metastatic prostate cancer) as evidenced by mild fat depletion, mild muscle depletion, percent weight loss.  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  Pt admitted with esophageal dysmotility and weakness.   Reviewed I/O's: -421 ml x 24 hours and -171 ml since admission  UOP: 700 ml x 24 hours  GI following; plan for upper GI series today.    Spoke with pt at bedside, who complained of back pain at time of visit. RN informed.   Pt shares that he has a poor appetite at baseline, however, unable to provide accurate diet recall. He shares that he developed dysphagia for solid foods 3 days PTA. Pt shares that since this time he has been consuming mostly a liquid diet and drinking a lot of Ensure.   Pt endorses wt loss, but unsure of UBW or how much. Reviewed wt hx; pt has experienced a 7.2% wt loss over the past 2 months.   Discussed importance of good meal and supplement intake to promote healing. Pt amenable to continue Ensure supplements.   Medications reviewed and include lactulose and smog enema. Discussed with RN;  lactulose administered during visit to assist with BM. Pt reports no issues with constipation PTA.   Labs reviewed: Na: 126, K: 3.3.    NUTRITION - FOCUSED PHYSICAL EXAM:  Flowsheet Row Most Recent Value  Orbital Region Mild depletion  Upper Arm Region Mild depletion  Thoracic and Lumbar Region No depletion  Buccal Region No depletion  Temple Region Mild depletion  Clavicle Bone Region Mild depletion  Clavicle and Acromion Bone Region Mild depletion  Scapular Bone Region Mild depletion  Dorsal Hand No depletion  Patellar Region Mild depletion  Anterior Thigh Region Mild depletion  Posterior Calf Region Mild depletion  Edema (RD Assessment) None  Hair Reviewed  Eyes Reviewed  Mouth Reviewed  Skin Reviewed  Nails Reviewed       Diet Order:   Diet Order             Diet full liquid Room service appropriate? Yes; Fluid consistency: Thin  Diet effective now                   EDUCATION NEEDS:   Education needs have been addressed  Skin:  Skin Assessment: Reviewed RN Assessment  Last BM:  Unknown  Height:   Ht Readings from Last 1 Encounters:  01/18/23 5\' 9"  (1.753 m)    Weight:   Wt Readings from Last 1 Encounters:  01/18/23 64.4 kg    Ideal Body Weight:  72.7 kg  BMI:  Body mass index is 20.97 kg/m.  Estimated Nutritional Needs:   Kcal:  1900-2100  Protein:  100-115 grams  Fluid:  > 1.9 L    Levada Schilling, RD, LDN, CDCES Registered Dietitian II Certified Diabetes Care and Education Specialist Please refer to [facility] for RD and/or RD on-call/weekend/after hours pager

## 2023-01-21 DIAGNOSIS — R1319 Other dysphagia: Secondary | ICD-10-CM | POA: Diagnosis not present

## 2023-01-21 DIAGNOSIS — E440 Moderate protein-calorie malnutrition: Secondary | ICD-10-CM | POA: Insufficient documentation

## 2023-01-21 LAB — BASIC METABOLIC PANEL
Anion gap: 12 (ref 5–15)
BUN: 6 mg/dL — ABNORMAL LOW (ref 8–23)
CO2: 26 mmol/L (ref 22–32)
Calcium: 9.1 mg/dL (ref 8.9–10.3)
Chloride: 88 mmol/L — ABNORMAL LOW (ref 98–111)
Creatinine, Ser: 0.61 mg/dL (ref 0.61–1.24)
GFR, Estimated: >60 mL/min (ref >60)
Glucose, Bld: 189 mg/dL — ABNORMAL HIGH (ref 70–99)
Potassium: 3.8 mmol/L (ref 3.5–5.1)
Sodium: 126 mmol/L — ABNORMAL LOW (ref 135–145)

## 2023-01-21 LAB — CBC
HCT: 26.2 % — ABNORMAL LOW (ref 39.0–52.0)
Hemoglobin: 8.7 g/dL — ABNORMAL LOW (ref 13.0–17.0)
MCH: 23.6 pg — ABNORMAL LOW (ref 26.0–34.0)
MCHC: 33.2 g/dL (ref 30.0–36.0)
MCV: 71.2 fL — ABNORMAL LOW (ref 80.0–100.0)
Platelets: 673 10*3/uL — ABNORMAL HIGH (ref 150–400)
RBC: 3.68 MIL/uL — ABNORMAL LOW (ref 4.22–5.81)
RDW: 16.4 % — ABNORMAL HIGH (ref 11.5–15.5)
WBC: 14.4 10*3/uL — ABNORMAL HIGH (ref 4.0–10.5)
nRBC: 0.1 % (ref 0.0–0.2)

## 2023-01-21 LAB — MAGNESIUM: Magnesium: 1.9 mg/dL (ref 1.7–2.4)

## 2023-01-21 MED ORDER — SODIUM CHLORIDE 0.9 % IV SOLN
200.0000 mg | Freq: Once | INTRAVENOUS | Status: AC
  Administered 2023-01-22: 200 mg via INTRAVENOUS
  Filled 2023-01-21: qty 10
  Filled 2023-01-21: qty 200

## 2023-01-21 NOTE — TOC Initial Note (Signed)
Transition of Care [facility]) - Initial/Assessment Note    Patient Details  Name: WILBUR WESSELMANN MRN: [id number] Date of Birth: 02/24/1961  [facility]) CM/SW Contact:    Allena Katz, LCSW Phone Number: 01/21/2023, 2:29 PM  Clinical Narrative:  Pt admitted from home with dysphagia. Pt reports that he lives alone and is independent. Pt reports he does drive some at home. Pt states that he doesn't anticipate any needs at home when he discharges. Pt is active with [facility] for primary care. Oncology actively following patient for his prostate cancer.Pt has a chronic foley due to his prostate cancer.  Pt on a CLD as he is having trouble swallowing. TOC continuing to follow for care plan updates and needs.                  Expected Discharge Plan: Home/Self Care Barriers to Discharge: Continued Medical Work up   Patient Goals and CMS Choice Patient states their goals for this hospitalization and ongoing recovery are:: return home CMS Medicare.gov Compare Post Acute Care list provided to:: Patient        Expected Discharge Plan and Services       Living arrangements for the past 2 months: Single Family Home                                      Prior Living Arrangements/Services Living arrangements for the past 2 months: Single Family Home Lives with:: Self                   Activities of Daily Living Home Assistive Devices/Equipment: None ADL Screening (condition at time of admission) Patient's cognitive ability adequate to safely complete daily activities?: Yes Is the patient deaf or have difficulty hearing?: No Does the patient have difficulty seeing, even when wearing glasses/contacts?: No Does the patient have difficulty concentrating, remembering, or making decisions?: No Patient able to express need for assistance with ADLs?: No Does the patient have difficulty dressing or bathing?: No Independently performs ADLs?: Yes (appropriate for  developmental age) Does the patient have difficulty walking or climbing stairs?: Yes Weakness of Legs: Both Weakness of Arms/Hands: None  Permission Sought/Granted                  Emotional Assessment     Affect (typically observed): Accepting Orientation: : Oriented to Self, Oriented to Place, Oriented to  Time, Oriented to Situation      Admission diagnosis:  Dysphagia [R13.10] Patient Active Problem List   Diagnosis Date Noted   Malnutrition of moderate degree 01/21/2023   Hyponatremia 01/20/2023   SIRS (systemic inflammatory response syndrome) (HCC) 01/20/2023   Dysphagia 01/18/2023   Anemia 01/13/2023   Prostate cancer metastatic to bone (HCC) 11/27/2022   Screening for colon cancer 11/27/2022   Bladder outlet obstruction 11/27/2022   PCP:  [facility] Pharmacy:   [facility][id number] [place], [place] - [address] AT [facility] [address] [place] [address] Phone: [phone] Fax: [phone]  [facility] #[id number] [place], [place] - [address] AT [facility] [address] [place] [address] Phone: [phone] Fax: [phone]     Social Determinants of Health (SDOH) Social History: SDOH Screenings   Food Insecurity: No Food Insecurity (01/18/2023)  Recent Concern: Food Insecurity - Food Insecurity Present (  11/27/2022)  Housing: Low Risk  (01/18/2023)  Recent Concern: Housing - Medium Risk (11/27/2022)  Transportation Needs: No Transportation Needs (01/18/2023)  Utilities: Not At Risk (01/18/2023)  Depression (PHQ2-9): Low Risk  (11/27/2022)  Financial Resource Strain: Low Risk  (11/27/2022)  Stress: No Stress Concern Present (11/27/2022)  Tobacco Use: High Risk (01/18/2023)  Health Literacy: Adequate Health Literacy (11/27/2022)   SDOH Interventions:     Readmission Risk Interventions    01/21/2023    2:28 PM  Readmission Risk Prevention Plan  Transportation Screening  Complete  PCP or Specialist Appt within 3-5 Days Complete  HRI or Home Care Consult Complete  Palliative Care Screening Complete

## 2023-01-21 NOTE — Progress Notes (Signed)
[facility] Gastroenterology Inpatient Progress Note    Subjective: Patient seen for f/u dysphagia, anorexia. Patient reports tolerating clear liquids well. No dysphagia this morning. UGI series suggests esophageal dysmotility, no strictures, rings or masses noted. + Duodenal diverticulum, mild GERD present. Barium pill passed through gastroesophageal junction without difficulty  Objective: Vital signs in last 24 hours: Temp:  [97.7 F (36.5 C)-98.6 F (37 C)] 98.2 F (36.8 C) (09/17 0754) Pulse Rate:  [94-96] 96 (09/17 0754) Resp:  [16-18] 16 (09/17 0754) BP: (136-143)/(74-91) 136/74 (09/17 0754) SpO2:  [96 %-100 %] 100 % (09/17 0754) Blood pressure 136/74, pulse 96, temperature 98.2 F (36.8 C), resp. rate 16, height 5\' 9"  (1.753 m), weight 64.4 kg, SpO2 100%.    Intake/Output from previous day: 09/16 0701 - 09/17 0700 In: 0  Out: 1300 [Urine:1300]  Intake/Output this shift: No intake/output data recorded.   Gen: NAD. Appears comfortable.  HEENT: [place]/AT. PERRLA. Normal external ear exam.  Chest: CTA, no wheezes.  CV: RR nl S1, S2. No gallops.  Abd: soft, nt, nd. BS+ +Umbilical hernia.  Ext: no edema. Pulses 2+  Neuro: Alert and oriented. Judgement appears normal. Nonfocal.   Lab Results: Results for orders placed or performed during the hospital encounter of 01/18/23 (from the past 24 hour(s))  Basic metabolic panel     Status: Abnormal   Collection Time: 01/21/23  5:23 AM  Result Value Ref Range   Sodium 126 (L) 135 - 145 mmol/L   Potassium 3.8 3.5 - 5.1 mmol/L   Chloride 88 (L) 98 - 111 mmol/L   CO2 26 22 - 32 mmol/L   Glucose, Bld 189 (H) 70 - 99 mg/dL   BUN 6 (L) 8 - 23 mg/dL   Creatinine, Ser 8.29 0.61 - 1.24 mg/dL   Calcium 9.1 8.9 - 56.2 mg/dL   GFR, Estimated >13 >08 mL/min   Anion gap 12 5 - 15  CBC     Status: Abnormal   Collection Time: 01/21/23  5:23 AM  Result Value Ref Range   WBC 14.4 (H) 4.0 - 10.5 K/uL   RBC 3.68 (L) 4.22 - 5.81  MIL/uL   Hemoglobin 8.7 (L) 13.0 - 17.0 g/dL   HCT 65.7 (L) 84.6 - 96.2 %   MCV 71.2 (L) 80.0 - 100.0 fL   MCH 23.6 (L) 26.0 - 34.0 pg   MCHC 33.2 30.0 - 36.0 g/dL   RDW 95.2 (H) 84.1 - 32.4 %   Platelets 673 (H) 150 - 400 K/uL   nRBC 0.1 0.0 - 0.2 %  Magnesium     Status: None   Collection Time: 01/21/23  5:23 AM  Result Value Ref Range   Magnesium 1.9 1.7 - 2.4 mg/dL     Recent Labs    40/10/27 0450 01/20/23 0327 01/21/23 0523  WBC 11.5* 10.6* 14.4*  HGB 8.1* 7.9* 8.7*  HCT 23.6* 23.8* 26.2*  PLT 584* 651* 673*   BMET Recent Labs    01/19/23 0450 01/20/23 0327 01/21/23 0523  NA 128* 126* 126*  K 3.3* 3.3* 3.8  CL 86* 86* 88*  CO2 29 29 26   GLUCOSE 166* 192* 189*  BUN 10 7* 6*  CREATININE 0.68 0.68 0.61  CALCIUM 9.0 9.1 9.1   LFT No results for input(s): "PROT", "ALBUMIN", "AST", "ALT", "ALKPHOS", "BILITOT", "BILIDIR", "IBILI" in the last 72 hours. PT/INR No results for input(s): "LABPROT", "INR" in the last 72 hours. Hepatitis Panel No results for input(s): "HEPBSAG", "HCVAB", "HEPAIGM", "HEPBIGM" in  the last 72 hours. C-Diff No results for input(s): "CDIFFTOX" in the last 72 hours. No results for input(s): "CDIFFPCR" in the last 72 hours.   Studies/Results: DG UGI W SINGLE CM (SOL OR THIN BA)  Result Date: 01/20/2023 CLINICAL DATA:  Esophageal dysphagia, concern for possible stricture, esophagitis, gastritis, dysmotility. EXAM: DG UGI W SINGLE CM TECHNIQUE: Single contrast examination was then performed using thin liquid barium. This exam was performed by Mina Marble, PA-C , and was supervised and interpreted by Dr. Elige Ko. FLUOROSCOPY: Radiation Exposure Index (as provided by the fluoroscopic device): 15.00 mGy Kerma COMPARISON:  None Available. FINDINGS: Esophagus:  Normal appearance. Esophageal motility: Mild dysmotility with tertiary contractions and stasis of contrast material visualized. Gastroesophageal reflux: Small volume spontaneous  gastroesophageal reflux visualized, unable to provoke gastroesophageal reflux Ingested 13mm barium tablet: Passed normally Stomach: Normal appearance. No hiatal hernia. Gastric emptying: Normal. Duodenum:  Distal duodenal diverticulum visualized Other:  None. IMPRESSION: 1.  Mild esophageal dysmotility 2.  Small volume gastroesophageal reflux 3.  Distal duodenal diverticulum Electronically Signed   By: Elige Ko M.D.   On: 01/20/2023 23:30    Scheduled Inpatient Medications:    Chlorhexidine Gluconate Cloth  6 each Topical Daily   enoxaparin (LOVENOX) injection  40 mg Subcutaneous Q24H   feeding supplement  237 mL Oral TID BM   lactulose  30 g Oral BID   melatonin  2.5 mg Oral QHS   pantoprazole (PROTONIX) IV  40 mg Intravenous Q12H   senna-docusate  1 tablet Oral BID   tamsulosin  0.4 mg Oral Daily    Continuous Inpatient Infusions:    PRN Inpatient Medications:  ondansetron **OR** ondansetron (ZOFRAN) IV, oxyCODONE  Miscellaneous: N/A  Assessment:  Dysphagia - Currently stable, not occurring with clear liquids. UGI with only mild esophageal dysmotility. NO  obstruction or delay in passing tablet through esophagus. Hemetemesis - not significant, resolved. Hyponatremia Hypokalemia Meastatic Prostate CA. Dr. Cathie Hoops following.  Plan:  Advance to full liquid diet. NO indication for endoluminal evaluation at present  [facility] K. Norma Fredrickson, M.D. 01/21/2023, 1:23 PM

## 2023-01-21 NOTE — Progress Notes (Signed)
PROGRESS NOTE    [name]  EXB:[id number] DOB: October 13, 1960 DOA: 01/18/2023 PCP: [facility]  113A/113A-AA  LOS: 2 days   Brief hospital course:   Assessment & Plan: Louis Gardner is a 62 y.o. 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  Reportedly specks of blood in vomitus. --GI consulted --Upper GI series showed only Mild esophageal dysmotility  Plan: --soft diet, per GI --Ensure TID --no plan for luminal study, per GI   Hyponatremia, acute on chronic --does have baseline chronic hyponatremia dating back to 2021.  Na 123 on presentation. --urine osm and Na studies equivocal --monitor     Prostate cancer with mets --being followed by Oncology Dr. Cathie Hoops and NP Borders. --Onc consulted with Dr. Cathie Hoops --cont oxycodone PRN   Chronic constipation --2/2 opioids use --s/p aggressive Miralax and enema --need regular bowel regimen    Chronic Foley --pt said Foley is for his prostate cancer.  Foley exchanged in the ED. --cont Flomax   Hypokalemia --monitor and supplement PRN   Microcytic anemia --Hgb 8's, stable --management per onc   Leukocytosis --no obvious signs of infection.   --Hold abx   DVT prophylaxis: Lovenox SQ Code Status: Full code  Family Communication:  Level of care: Med-Surg Dispo:   The patient is from: home Anticipated d/c is to: home Anticipated d/c date is: tomorrow   Subjective and Interval History:  Continue to have BM's.   Objective: Vitals:   01/21/23 0540 01/21/23 0754 01/21/23 1503 01/21/23 1957  BP: (!) 140/75 136/74 129/78 139/81  Pulse: 95 96  (!) 57  Resp: 18 16 18 17   Temp: 98.6 F (37 C) 98.2 F (36.8 C) 98.1 F (36.7 C) 98.1 F (36.7 C)  TempSrc: Oral  Oral   SpO2: 96% 100% 100% 100%  Weight:      Height:        Intake/Output Summary (Last 24 hours) at  01/21/2023 2031 Last data filed at 01/21/2023 0540 Gross per 24 hour  Intake --  Output 1300 ml  Net -1300 ml   Filed Weights   01/18/23 2145  Weight: 64.4 kg    Examination:   Constitutional: NAD, AAOx3 HEENT: conjunctivae and lids normal, EOMI CV: No cyanosis.   RESP: normal respiratory effort, on RA Neuro: II - XII grossly intact.     Data Reviewed: I have personally reviewed labs and imaging studies  Time spent: 35 minutes  Darlin Priestly, MD [facility] If 7PM-7AM, please contact night-coverage 01/21/2023, 8:31 PM

## 2023-01-22 ENCOUNTER — Inpatient Hospital Stay: Payer: Medicaid Other

## 2023-01-22 ENCOUNTER — Inpatient Hospital Stay: Payer: Medicaid Other | Admitting: Oncology

## 2023-01-22 DIAGNOSIS — D75839 Thrombocytosis, unspecified: Secondary | ICD-10-CM

## 2023-01-22 DIAGNOSIS — D72829 Elevated white blood cell count, unspecified: Secondary | ICD-10-CM

## 2023-01-22 DIAGNOSIS — R1310 Dysphagia, unspecified: Secondary | ICD-10-CM | POA: Diagnosis not present

## 2023-01-22 DIAGNOSIS — D72828 Other elevated white blood cell count: Secondary | ICD-10-CM

## 2023-01-22 DIAGNOSIS — D500 Iron deficiency anemia secondary to blood loss (chronic): Secondary | ICD-10-CM | POA: Diagnosis not present

## 2023-01-22 DIAGNOSIS — R1319 Other dysphagia: Secondary | ICD-10-CM | POA: Diagnosis not present

## 2023-01-22 DIAGNOSIS — E871 Hypo-osmolality and hyponatremia: Secondary | ICD-10-CM | POA: Diagnosis not present

## 2023-01-22 LAB — CBC WITH DIFFERENTIAL/PLATELET
Abs Immature Granulocytes: 0.43 10*3/uL — ABNORMAL HIGH (ref 0.00–0.07)
Basophils Absolute: 0 10*3/uL (ref 0.0–0.1)
Basophils Relative: 0 %
Eosinophils Absolute: 0 10*3/uL (ref 0.0–0.5)
Eosinophils Relative: 0 %
HCT: 27.9 % — ABNORMAL LOW (ref 39.0–52.0)
Hemoglobin: 8.6 g/dL — ABNORMAL LOW (ref 13.0–17.0)
Immature Granulocytes: 2 %
Lymphocytes Relative: 12 %
Lymphs Abs: 2.2 10*3/uL (ref 0.7–4.0)
MCH: 22.9 pg — ABNORMAL LOW (ref 26.0–34.0)
MCHC: 30.8 g/dL (ref 30.0–36.0)
MCV: 74.2 fL — ABNORMAL LOW (ref 80.0–100.0)
Monocytes Absolute: 1.7 10*3/uL — ABNORMAL HIGH (ref 0.1–1.0)
Monocytes Relative: 9 %
Neutro Abs: 14.8 10*3/uL — ABNORMAL HIGH (ref 1.7–7.7)
Neutrophils Relative %: 77 %
Platelets: 701 10*3/uL — ABNORMAL HIGH (ref 150–400)
RBC: 3.76 MIL/uL — ABNORMAL LOW (ref 4.22–5.81)
RDW: 17.1 % — ABNORMAL HIGH (ref 11.5–15.5)
WBC: 19.2 10*3/uL — ABNORMAL HIGH (ref 4.0–10.5)
nRBC: 0.2 % (ref 0.0–0.2)

## 2023-01-22 MED ORDER — POLYETHYLENE GLYCOL 3350 17 G PO PACK
17.0000 g | PACK | Freq: Every day | ORAL | Status: DC
  Filled 2023-01-22: qty 1

## 2023-01-22 MED ORDER — POTASSIUM CHLORIDE 10 MEQ/100ML IV SOLN
10.0000 meq | INTRAVENOUS | Status: DC
  Administered 2023-01-22 (×2): 10 meq via INTRAVENOUS
  Filled 2023-01-22 (×2): qty 100

## 2023-01-22 MED ORDER — POTASSIUM CHLORIDE 20 MEQ PO PACK
40.0000 meq | PACK | Freq: Once | ORAL | Status: AC
  Administered 2023-01-22: 40 meq via ORAL
  Filled 2023-01-22: qty 2

## 2023-01-22 MED ORDER — MORPHINE SULFATE (PF) 2 MG/ML IV SOLN
2.0000 mg | INTRAVENOUS | Status: DC | PRN
  Administered 2023-01-22 – 2023-01-23 (×2): 2 mg via INTRAVENOUS
  Filled 2023-01-22 (×2): qty 1

## 2023-01-22 NOTE — Progress Notes (Addendum)
PROGRESS NOTE    Louis Gardner  HWE:[id number] DOB: Oct 17, 1960 DOA: 01/18/2023 PCP: [facility]  113A/113A-AA  LOS: 3 days   Brief hospital course:   Assessment & Plan: Louis Gardner is a 62 y.o. 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  Reportedly specks of blood in vomitus. --GI consulted --Upper GI series showed only Mild esophageal dysmotility  Plan: --cont soft diet --Ensure TID --no plan for luminal study, per GI   Hyponatremia, acute on chronic --does have baseline chronic hyponatremia dating back to 2021.  Na 123 on presentation. --urine osm and Na studies equivocal --monitor     Prostate cancer with mets --being followed by Oncology Dr. Cathie Hoops and NP [facility]. --Onc consulted with Dr. Cathie Hoops --cont oxycodone PRN --due to concern for marrow involvement by his cancer, or other marrow disorders, will plan for inpatient bone marrow biopsy, per Dr. Cathie Hoops   Chronic constipation --2/2 opioids use --s/p aggressive Miralax and enema --cont scheduled Senokot   Chronic Foley --pt said Foley is for his prostate cancer.  Foley exchanged in the ED. --cont Flomax   Hypokalemia --monitor and supplement PRN   Microcytic anemia --Hgb 8's, stable --management per onc --plan for inpatient bone marrow biopsy, per Dr. Cathie Hoops   Leukocytosis --WBC trending up, no obvious signs of infection.   --Hold abx --plan for inpatient bone marrow biopsy, per Dr. Cathie Hoops  Moderate malnutrition    DVT prophylaxis: Lovenox SQ Code Status: Full code  Family Communication: called mother, no answer Level of care: Med-Surg Dispo:   The patient is from: home Anticipated d/c is to: home Anticipated d/c date is: 1-2 days   Subjective and Interval History:  Pt reported swallow function improved, and now can tolerate solids.    Having BM's now  that are water, about 2 episodes per day.   Objective: Vitals:   01/21/23 1957 01/22/23 0458 01/22/23 0853 01/22/23 1709  BP: 139/81 126/79 139/89 122/82  Pulse: (!) 57 (!) 103 98 (!) 104  Resp: 17 14 16 17   Temp: 98.1 F (36.7 C) 98.7 F (37.1 C) 97.8 F (36.6 C) 98.9 F (37.2 C)  TempSrc:    Oral  SpO2: 100% 100% 100% 99%  Weight:      Height:        Intake/Output Summary (Last 24 hours) at 01/22/2023 1954 Last data filed at 01/22/2023 1620 Gross per 24 hour  Intake 90.27 ml  Output 350 ml  Net -259.73 ml   Filed Weights   01/18/23 2145  Weight: 64.4 kg    Examination:   Constitutional: NAD, AAOx3 HEENT: conjunctivae and lids normal, EOMI CV: No cyanosis.   RESP: normal respiratory effort, on RA Neuro: II - XII grossly intact.   Psych: Normal mood and affect.  Appropriate judgement and reason   Data Reviewed: I have personally reviewed labs and imaging studies  Time spent: 35 minutes  Darlin Priestly, MD [facility] If 7PM-7AM, please contact night-coverage 01/22/2023, 7:54 PM

## 2023-01-22 NOTE — Progress Notes (Signed)
Hematology/Oncology Progress note [phone] [phone]     Patient Care Team: [facility] as PCP - General Debbe Odea, MD as PCP - Cardiology (Cardiology) Rickard Patience, MD as Consulting Physician (Oncology)   Name of the patient: Louis Gardner  [id number]  [birth year]  Date of visit: 01/22/23   INTERVAL HISTORY-   Patient was seen at bedside.  No acute overnight events.  Dysphagia slightly better.  No plan from GI for endoscopy. Status post 2 doses of IV Venofer treatments.  He tolerated well.  Hemoglobin has remained stable at 8.7 Patient has progressive leukocytosis and thrombocytosis.  No Known Allergies  Patient Active Problem List   Diagnosis Date Noted   Prostate cancer metastatic to bone (HCC) 11/27/2022    Priority: High   Screening for colon cancer 11/27/2022    Priority: Low   Bladder outlet obstruction 11/27/2022    Priority: Low   Malnutrition of moderate degree 01/21/2023   Hyponatremia 01/20/2023   SIRS (systemic inflammatory response syndrome) (HCC) 01/20/2023   Dysphagia 01/18/2023   Anemia 01/13/2023     Past Medical History:  Diagnosis Date   Arthritis    Depression    GI bleeding    Hypertension    Pancreatic cancer (HCC)    Sleep apnea      Past Surgical History:  Procedure Laterality Date   COLONOSCOPY     COLONOSCOPY WITH PROPOFOL N/A 12/09/2022   Procedure: COLONOSCOPY WITH PROPOFOL;  Surgeon: Wyline Mood, MD;  Location: [facility] ENDOSCOPY;  Service: Gastroenterology;  Laterality: N/A;   ESOPHAGOGASTRODUODENOSCOPY (EGD) WITH PROPOFOL N/A 03/19/2016   Procedure: ESOPHAGOGASTRODUODENOSCOPY (EGD) WITH PROPOFOL;  Surgeon: Christena Deem, MD;  Location: [facility] ENDOSCOPY;  Service: Endoscopy;  Laterality: N/A;    Social History   Socioeconomic History   Marital status: Single    Spouse name: Not on file   Number of children: Not on file   Years of education: Not on file   Highest education  level: Not on file  Occupational History   Not on file  Tobacco Use   Smoking status: Every Day    Current packs/day: 0.25    Average packs/day: 0.3 packs/day for 48.0 years (12.0 ttl pk-yrs)    Types: Cigarettes    Passive exposure: Current   Smokeless tobacco: Never  Vaping Use   Vaping status: Never Used  Substance and Sexual Activity   Alcohol use: Yes    Alcohol/week: 6.0 standard drinks of alcohol    Types: 6 Cans of beer per week   Drug use: No   Sexual activity: Not on file  Other Topics Concern   Not on file  Social History Narrative   Not on file   Social Determinants of Health   Financial Resource Strain: Low Risk  (11/27/2022)   Overall Financial Resource Strain (CARDIA)    Difficulty of Paying Living Expenses: Not very hard  Food Insecurity: No Food Insecurity (01/18/2023)   Hunger Vital Sign    Worried About Running Out of Food in the Last Year: Never true    Ran Out of Food in the Last Year: Never true  Recent Concern: Food Insecurity - Food Insecurity Present (11/27/2022)   Hunger Vital Sign    Worried About Running Out of Food in the Last Year: Sometimes true    Ran Out of Food in the Last Year: Never true  Transportation Needs: No Transportation Needs (01/18/2023)   PRAPARE - Transportation    Lack of Transportation (  Medical): No    Lack of Transportation (Non-Medical): No  Physical Activity: Not on file  Stress: No Stress Concern Present (11/27/2022)   [employer] of Occupational Health - Occupational Stress Questionnaire    Feeling of Stress : Only a little  Social Connections: Not on file  Intimate Partner Violence: Not At Risk (01/18/2023)   Humiliation, Afraid, Rape, and Kick questionnaire    Fear of Current or Ex-Partner: No    Emotionally Abused: No    Physically Abused: No    Sexually Abused: No     History reviewed. No pertinent family history.   Current Facility-Administered Medications:    Chlorhexidine Gluconate Cloth 2 % PADS 6  each, 6 each, Topical, Daily, Darlin Priestly, MD, 6 each at 01/22/23 1327   enoxaparin (LOVENOX) injection 40 mg, 40 mg, Subcutaneous, Q24H, Darlin Priestly, MD, 40 mg at 01/22/23 1752   feeding supplement (ENSURE ENLIVE / ENSURE PLUS) liquid 237 mL, 237 mL, Oral, TID BM, Darlin Priestly, MD, 237 mL at 01/22/23 2058   lactulose (CHRONULAC) 10 GM/15ML solution 30 g, 30 g, Oral, BID, Croley, Granville M, PA-C, 30 g at 01/22/23 2059   melatonin tablet 2.5 mg, 2.5 mg, Oral, QHS, Lindajo Royal V, MD, 2.5 mg at 01/22/23 2058   morphine (PF) 2 MG/ML injection 2 mg, 2 mg, Intravenous, Q4H PRN, Mansy, Jan A, MD, 2 mg at 01/22/23 2114   ondansetron (ZOFRAN) tablet 4 mg, 4 mg, Oral, Q6H PRN **OR** ondansetron (ZOFRAN) injection 4 mg, 4 mg, Intravenous, Q6H PRN, Andris Baumann, MD, 4 mg at 01/22/23 2058   oxyCODONE (Oxy IR/ROXICODONE) immediate release tablet 5 mg, 5 mg, Oral, Q8H PRN, Darlin Priestly, MD, 5 mg at 01/22/23 1755   pantoprazole (PROTONIX) injection 40 mg, 40 mg, Intravenous, Q12H, Darlin Priestly, MD, 40 mg at 01/22/23 2058   senna-docusate (Senokot-S) tablet 1 tablet, 1 tablet, Oral, BID, Darlin Priestly, MD, 1 tablet at 01/22/23 2058   tamsulosin (FLOMAX) capsule 0.4 mg, 0.4 mg, Oral, Daily, Darlin Priestly, MD, 0.4 mg at 01/22/23 0851   Physical exam:  Vitals:   01/22/23 0458 01/22/23 0853 01/22/23 1709 01/22/23 2022  BP: 126/79 139/89 122/82 125/74  Pulse: (!) 103 98 (!) 104 (!) 110  Resp: 14 16 17 17   Temp: 98.7 F (37.1 C) 97.8 F (36.6 C) 98.9 F (37.2 C) 98.8 F (37.1 C)  TempSrc:   Oral   SpO2: 100% 100% 99% 99%  Weight:      Height:       Physical Exam    Labs    Latest Ref Rng & Units 01/22/2023    4:55 AM 01/21/2023    5:23 AM 01/20/2023    3:27 AM  CBC  WBC 4.0 - 10.5 K/uL 4.0 - 10.5 K/uL 19.0    19.2  14.4  10.6   Hemoglobin 13.0 - 17.0 g/dL 16.1 - 09.6 g/dL 8.7    8.6  8.7  7.9   Hematocrit 39.0 - 52.0 % 39.0 - 52.0 % 26.1    27.9  26.2  23.8   Platelets 150 - 400 K/uL 150 - 400 K/uL 681     701  673  651       Latest Ref Rng & Units 01/22/2023    4:55 AM 01/21/2023    5:23 AM 01/20/2023    3:27 AM  CMP  Glucose 70 - 99 mg/dL 045  409  811   BUN 8 - 23 mg/dL <5  6  7   Creatinine 0.61 - 1.24 mg/dL 2.95  2.84  1.32   Sodium 135 - 145 mmol/L 129  126  126   Potassium 3.5 - 5.1 mmol/L 3.2  3.8  3.3   Chloride 98 - 111 mmol/L 90  88  86   CO2 22 - 32 mmol/L 26  26  29    Calcium 8.9 - 10.3 mg/dL 9.1  9.1  9.1      RADIOGRAPHIC STUDIES: I have personally reviewed the radiological images as listed and agreed with the findings in the report. DG UGI W SINGLE CM (SOL OR THIN BA)  Result Date: 01/20/2023 CLINICAL DATA:  Esophageal dysphagia, concern for possible stricture, esophagitis, gastritis, dysmotility. EXAM: DG UGI W SINGLE CM TECHNIQUE: Single contrast examination was then performed using thin liquid barium. This exam was performed by Mina Marble, PA-C , and was supervised and interpreted by Dr. Elige Ko. FLUOROSCOPY: Radiation Exposure Index (as provided by the fluoroscopic device): 15.00 mGy Kerma COMPARISON:  None Available. FINDINGS: Esophagus:  Normal appearance. Esophageal motility: Mild dysmotility with tertiary contractions and stasis of contrast material visualized. Gastroesophageal reflux: Small volume spontaneous gastroesophageal reflux visualized, unable to provoke gastroesophageal reflux Ingested 13mm barium tablet: Passed normally Stomach: Normal appearance. No hiatal hernia. Gastric emptying: Normal. Duodenum:  Distal duodenal diverticulum visualized Other:  None. IMPRESSION: 1.  Mild esophageal dysmotility 2.  Small volume gastroesophageal reflux 3.  Distal duodenal diverticulum Electronically Signed   By: Elige Ko M.D.   On: 01/20/2023 23:30   DG Chest 2 View  Result Date: 01/18/2023 CLINICAL DATA:  Weakness and decreased appetite.  Hematemesis. EXAM: CHEST - 2 VIEW COMPARISON:  Chest radiograph dated 01/07/2023 FINDINGS: Normal lung volumes. Bilateral  lower lung interstitial opacities. No pleural effusion or pneumothorax. The heart size and mediastinal contours are within normal limits. No acute osseous abnormality. IMPRESSION: Bilateral lower lung interstitial opacities, which may represent atelectasis or infection. Electronically Signed   By: Agustin Cree M.D.   On: 01/18/2023 14:09   DG Abd 1 View  Result Date: 01/15/2023 CLINICAL DATA:  abd pain/constipation, r/o obstructive gas pattern EXAM: ABDOMEN - 1 VIEW COMPARISON:  CT AP 01/07/23 FINDINGS: Nonobstructive bowel gas pattern. Large colonic stool burden. Pelvic phleboliths. Rounded density projecting over the L4-L5 vertebral body levels is nonspecific, but favored to represent patient's known fat containing umbilical hernia. Assessment of the sacrum is slightly limited due to overlying bowel gas. No acute osseous abnormality. IMPRESSION: Nonobstructive bowel gas pattern. Large colonic stool burden. Electronically Signed   By: Lorenza Cambridge M.D.   On: 01/15/2023 13:02   CT Angio Chest PE W/Cm &/Or Wo Cm  Result Date: 01/07/2023 CLINICAL DATA:  pt reports since last night having left sided CP, nonradiating accomp by nausea; Abdominal pain, acute, nonlocalized History of metastatic prostate cancer. EXAM: CT ANGIOGRAPHY CHEST CT ABDOMEN AND PELVIS WITH CONTRAST TECHNIQUE: Multidetector CT imaging of the chest was performed using the standard protocol during bolus administration of intravenous contrast. Multiplanar CT image reconstructions and MIPs were obtained to evaluate the vascular anatomy. Multidetector CT imaging of the abdomen and pelvis was performed using the standard protocol during bolus administration of intravenous contrast. RADIATION DOSE REDUCTION: This exam was performed according to the departmental dose-optimization program which includes automated exposure control, adjustment of the mA and/or kV according to patient size and/or use of iterative reconstruction technique. CONTRAST:   OMNIPAQUE IOHEXOL 350 MG/ML SOLN COMPARISON:  Chest XR, 01/07/2023. CTA chest, 11/23/2022. CT AP, 03/26/2023.  PET-CT, 12/18/2022. FINDINGS: CTA CHEST FINDINGS Cardiovascular: Satisfactory opacification of the pulmonary arteries to the segmental level. No segmental or larger pulmonary embolus. normal heart size. No pericardial effusion. Mediastinum/Nodes: Interval enlargement in mediastinal adenopathy, with representative prevascular and precarinal lymph nodes measuring approximately 2.5 x 1.7 cm and 2.5 x 3.8 cm (AP by transaxial) respectively. No enlarged axillary lymph nodes. Thyroid gland, trachea, and esophagus demonstrate no significant findings. Lungs/Pleura: Mild basilar atelectasis. Lungs are otherwise clear without focal consolidation, mass or suspicious pulmonary nodule. No pleural effusion or pneumothorax. Musculoskeletal: Mild gynecomastia. No acute chest wall abnormality. Multifocal sclerotic osseous lesions within the imaged thoracic spine. Review of the MIP images confirms the above findings. CT ABDOMEN and PELVIS FINDINGS Hepatobiliary: Normal volume of liver and contour. Increased number and conspicuity of small, multifocal rounded and hypodense lesions scattered within liver contracted gallbladder with dense gallstones. No gallbladder wall thickening, or biliary dilatation. Pancreas: Relatively similar size and appearance ~ 1 cm hypodense pancreatic cystic lesion at the uncinate. No pancreatic ductal dilatation or surrounding inflammatory changes. Spleen: Normal in size without focal abnormality. Adrenals/Urinary Tract: Adrenal glands are unremarkable. Kidneys are normal, without renal calculi, focal lesion, or hydronephrosis. Bladder is decompressed by Foley catheter. Stomach/Bowel: Stomach is within normal limits. Appendix is not definitively visualized. Nonobstructed small bowel. Nondilated colon. Severe burden of colonic diverticulosis, involving the descending and transverse colon. No evidence  of bowel wall thickening, distention, or acute inflammatory change. Vascular/Lymphatic: No significant vascular findings are present. No enlarged abdominal or pelvic lymph nodes. Reproductive: Irregular nodular contour of prostate. Other: No abdominopelvic ascites or intraperitoneal free air. Postsurgical changes of prior laparotomy. Fat-containing, umbilical ventral hernia. Musculoskeletal: No acute osseous finding teams or displaced fracture. Multifocal osseous sclerotic lesions within the imaged pelvis and axial spine. Review of the MIP images confirms the above findings. IMPRESSION: Since CT CAP dated 12/15/2022; 1. No segmental or larger pulmonary embolus. 2. No acute abdominopelvic process. 3. Progression of metastatic disease, with enlargement in mediastinal adenopathy within chest, and increased number and conspicuity of small multifocal lesions within liver, as described in detail above. Electronically Signed   By: Roanna Banning M.D.   On: 01/07/2023 09:29   CT ABDOMEN PELVIS W CONTRAST  Result Date: 01/07/2023 CLINICAL DATA:  pt reports since last night having left sided CP, nonradiating accomp by nausea; Abdominal pain, acute, nonlocalized History of metastatic prostate cancer. EXAM: CT ANGIOGRAPHY CHEST CT ABDOMEN AND PELVIS WITH CONTRAST TECHNIQUE: Multidetector CT imaging of the chest was performed using the standard protocol during bolus administration of intravenous contrast. Multiplanar CT image reconstructions and MIPs were obtained to evaluate the vascular anatomy. Multidetector CT imaging of the abdomen and pelvis was performed using the standard protocol during bolus administration of intravenous contrast. RADIATION DOSE REDUCTION: This exam was performed according to the departmental dose-optimization program which includes automated exposure control, adjustment of the mA and/or kV according to patient size and/or use of iterative reconstruction technique. CONTRAST:  OMNIPAQUE IOHEXOL  350 MG/ML SOLN COMPARISON:  Chest XR, 01/07/2023. CTA chest, 11/23/2022. CT AP, 03/26/2023. PET-CT, 12/18/2022. FINDINGS: CTA CHEST FINDINGS Cardiovascular: Satisfactory opacification of the pulmonary arteries to the segmental level. No segmental or larger pulmonary embolus. normal heart size. No pericardial effusion. Mediastinum/Nodes: Interval enlargement in mediastinal adenopathy, with representative prevascular and precarinal lymph nodes measuring approximately 2.5 x 1.7 cm and 2.5 x 3.8 cm (AP by transaxial) respectively. No enlarged axillary lymph nodes. Thyroid gland, trachea, and esophagus demonstrate no significant findings. Lungs/Pleura: Mild basilar  atelectasis. Lungs are otherwise clear without focal consolidation, mass or suspicious pulmonary nodule. No pleural effusion or pneumothorax. Musculoskeletal: Mild gynecomastia. No acute chest wall abnormality. Multifocal sclerotic osseous lesions within the imaged thoracic spine. Review of the MIP images confirms the above findings. CT ABDOMEN and PELVIS FINDINGS Hepatobiliary: Normal volume of liver and contour. Increased number and conspicuity of small, multifocal rounded and hypodense lesions scattered within liver contracted gallbladder with dense gallstones. No gallbladder wall thickening, or biliary dilatation. Pancreas: Relatively similar size and appearance ~ 1 cm hypodense pancreatic cystic lesion at the uncinate. No pancreatic ductal dilatation or surrounding inflammatory changes. Spleen: Normal in size without focal abnormality. Adrenals/Urinary Tract: Adrenal glands are unremarkable. Kidneys are normal, without renal calculi, focal lesion, or hydronephrosis. Bladder is decompressed by Foley catheter. Stomach/Bowel: Stomach is within normal limits. Appendix is not definitively visualized. Nonobstructed small bowel. Nondilated colon. Severe burden of colonic diverticulosis, involving the descending and transverse colon. No evidence of bowel wall  thickening, distention, or acute inflammatory change. Vascular/Lymphatic: No significant vascular findings are present. No enlarged abdominal or pelvic lymph nodes. Reproductive: Irregular nodular contour of prostate. Other: No abdominopelvic ascites or intraperitoneal free air. Postsurgical changes of prior laparotomy. Fat-containing, umbilical ventral hernia. Musculoskeletal: No acute osseous finding teams or displaced fracture. Multifocal osseous sclerotic lesions within the imaged pelvis and axial spine. Review of the MIP images confirms the above findings. IMPRESSION: Since CT CAP dated 12/15/2022; 1. No segmental or larger pulmonary embolus. 2. No acute abdominopelvic process. 3. Progression of metastatic disease, with enlargement in mediastinal adenopathy within chest, and increased number and conspicuity of small multifocal lesions within liver, as described in detail above. Electronically Signed   By: Roanna Banning M.D.   On: 01/07/2023 09:29   DG Chest Portable 1 View  Result Date: 01/07/2023 CLINICAL DATA:  Chest pain EXAM: PORTABLE CHEST 1 VIEW COMPARISON:  11/23/2022 FINDINGS: Artifact from EKG leads. Rounded density over the left mid lung is attributed to the anterior fifth rib based on prior CT, there is known prostate cancer and bone metastases. There is no edema, consolidation, effusion, or pneumothorax. Normal heart size and mediastinal contours. IMPRESSION: No active disease. Electronically Signed   By: Tiburcio Pea M.D.   On: 01/07/2023 07:41    Assessment and plan-   # Acute anemia, hemoglobin dropped from 11.1 in July. He has very mild hematemesis, which is not proportionate to the hemoglobin drop. Chronic microcytic, iron panel done in July 2024 as well as 01/15/2023 showed significant elevated ferritin, decreased iron saturation.  Decreased TIBC, Reticulocyte panel is decreased, microcytosis with thrombocytosis.  Possible iron deficiency anemia.  Ferritin elevation may be secondary  to acute phase reactant Status post IV Venofer 200 mg daily x 2.  Hemoglobin remained stable. GI does not plan inpatient endoscopy.  Recommend bone marrow biopsy for further evaluation. Please transfuse PRBC if hemoglobin drops below 7 or if he is symptomatic.   # Hyponatremia, acute on chronic probably secondary to dehydration as well as chronic alcohol use. # Persistent leukocytosis thrombocytosis.  Likely reactive to metastatic prostate cancer, indwelling Foley catheter.  No signs of infection.  Check hepatitis, flow cytometry.  Rule out bone marrow disorders.  # Prostate cancer with metastasis. Pending Pluvicto treatments, original plan on 01/23/23, is canceled due to hospitalization status.   Chronic urinary outlet obstruction, with indwelling Foley catheter.  Tube was exchanged in ED.  Thank you for allowing me to participate in the care of this patient.  Rickard Patience, MD, PhD Hematology Oncology 01/22/2023

## 2023-01-22 NOTE — Plan of Care (Signed)

## 2023-01-23 ENCOUNTER — Inpatient Hospital Stay (HOSPITAL_COMMUNITY)
Admission: RE | Admit: 2023-01-23 | Discharge: 2023-01-23 | Disposition: A | Discharge status: Home / Self Care | Payer: Medicaid Other | Source: Ambulatory Visit | Attending: Oncology | Admitting: Oncology

## 2023-01-23 DIAGNOSIS — R1319 Other dysphagia: Secondary | ICD-10-CM | POA: Diagnosis not present

## 2023-01-23 LAB — BASIC METABOLIC PANEL
Anion gap: 11 (ref 5–15)
BUN: 6 mg/dL — ABNORMAL LOW (ref 8–23)
CO2: 24 mmol/L (ref 22–32)
Calcium: 8.7 mg/dL — ABNORMAL LOW (ref 8.9–10.3)
Chloride: 91 mmol/L — ABNORMAL LOW (ref 98–111)
Creatinine, Ser: 0.71 mg/dL (ref 0.61–1.24)
GFR, Estimated: >60 mL/min (ref >60)
Glucose, Bld: 191 mg/dL — ABNORMAL HIGH (ref 70–99)
Potassium: 3.6 mmol/L (ref 3.5–5.1)
Sodium: 126 mmol/L — ABNORMAL LOW (ref 135–145)

## 2023-01-23 LAB — CBC
HCT: 24.6 % — ABNORMAL LOW (ref 39.0–52.0)
Hemoglobin: 8.2 g/dL — ABNORMAL LOW (ref 13.0–17.0)
MCH: 23.5 pg — ABNORMAL LOW (ref 26.0–34.0)
MCHC: 33.3 g/dL (ref 30.0–36.0)
MCV: 70.5 fL — ABNORMAL LOW (ref 80.0–100.0)
Platelets: 625 10*3/uL — ABNORMAL HIGH (ref 150–400)
RBC: 3.49 MIL/uL — ABNORMAL LOW (ref 4.22–5.81)
RDW: 16.7 % — ABNORMAL HIGH (ref 11.5–15.5)
WBC: 18.4 10*3/uL — ABNORMAL HIGH (ref 4.0–10.5)
nRBC: 0.2 % (ref 0.0–0.2)

## 2023-01-23 LAB — MAGNESIUM: Magnesium: 1.7 mg/dL (ref 1.7–2.4)

## 2023-01-23 NOTE — Progress Notes (Signed)
PROGRESS NOTE    Louis Gardner  ION:[id number] DOB: 06/05/1960 DOA: 01/18/2023 PCP: [facility]  113A/113A-AA  LOS: 4 days   Brief hospital course:   Assessment & Plan: TORETTO TOPPING is a 62 y.o. 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    Esophageal Dysphagia possible 2/2 dysmotility  --PTA, unable to swallow and keep solid foods down.  Reportedly specks of blood in vomitus. --GI consulted --Upper GI series showed only Mild esophageal dysmotility.  Pt was tried on soft diet, and so far has tolerated it well. Plan: --cont soft diet --Ensure TID --no plan for luminal study, per GI   Hyponatremia, acute on chronic --does have baseline chronic hyponatremia dating back to 2021.  Na 123 on presentation. --urine osm and Na studies equivocal --monitor     Prostate cancer with mets --being followed by Oncology Dr. Cathie Hoops and NP Borders. --Onc consulted with Dr. Cathie Hoops Plan: --cont oxycodone PRN --due to concern for marrow involvement by his cancer, or other marrow disorders, will plan for inpatient bone marrow biopsy tomorrow with IR   Chronic constipation --2/2 opioids use --s/p aggressive Miralax and enema --cont scheduled Senokot   Chronic Foley --pt said Foley is for his prostate cancer.  Foley exchanged in the ED. --cont Flomax   Hypokalemia --monitor and supplement PRN   Microcytic anemia --Hgb 8's, stable --management per onc --plan for inpatient bone marrow biopsy, per Dr. Cathie Hoops   Leukocytosis --WBC trending up, no obvious signs of infection.   --Hold abx --plan for inpatient bone marrow biopsy, per Dr. Cathie Hoops  Moderate malnutrition    DVT prophylaxis: Lovenox SQ Code Status: Full code  Family Communication: girlfriend updated at bedside today Level of care: Med-Surg Dispo:   The patient is from:  home Anticipated d/c is to: home Anticipated d/c date is: 1-2 days, pending bone marrow biopsy   Subjective and Interval History:  Pt was kept NPO this morning with hope to be added-on to IR scheduled for bone marrow biopsy.  Pt reported being hungry, and had "squirts" with BM's.    Bone Marrow biopsy was eventually scheduled for tomorrow.   Objective: Vitals:   01/23/23 0601 01/23/23 0815 01/23/23 1534 01/23/23 2000  BP: 116/75 117/78 116/72 120/78  Pulse: 99 97 (!) 106 97  Resp:  16 18 18   Temp: 98.9 F (37.2 C) 98.6 F (37 C) 99.7 F (37.6 C) 99.7 F (37.6 C)  TempSrc: Oral Oral Oral   SpO2: 100% 100% 100% 98%  Weight:      Height:        Intake/Output Summary (Last 24 hours) at 01/23/2023 2029 Last data filed at 01/23/2023 0815 Gross per 24 hour  Intake 240 ml  Output 225 ml  Net 15 ml   Filed Weights   01/18/23 2145  Weight: 64.4 kg    Examination:   Constitutional: NAD, AAOx3 HEENT: conjunctivae and lids normal, EOMI CV: No cyanosis.   RESP: normal respiratory effort, on RA Neuro: II - XII grossly intact.   Psych: Normal mood and affect.  Appropriate judgement and reason   Data Reviewed: I have personally reviewed labs and imaging studies  Time spent: 35 minutes  Darlin Priestly, MD [facility] If 7PM-7AM, please contact night-coverage 01/23/2023, 8:29 PM

## 2023-01-23 NOTE — Consult Note (Signed)
Chief Complaint: Patient was seen in consultation today for acute anemia  Referring Physician(s): Zhour Cathie Hoops, MD  Supervising Physician: Gilmer Mor  Patient Status: [facility] - In-pt  History of Present Illness: Louis Gardner is a 62 y.o. male with PMH significant for arthritis, depression, GI bleeding, hypertension, and prostate cancer with metastases being seen today in relation to acute anemia. Patient has been followed by Dr Cathie Hoops from Oncology who has referred patient to IR for image-guided bone marrow biopsy to further evaluate patient's anemia.  Past Medical History:  Diagnosis Date   Arthritis    Depression    GI bleeding    Hypertension    Pancreatic cancer (HCC)    Sleep apnea     Past Surgical History:  Procedure Laterality Date   COLONOSCOPY     COLONOSCOPY WITH PROPOFOL N/A 12/09/2022   Procedure: COLONOSCOPY WITH PROPOFOL;  Surgeon: Wyline Mood, MD;  Location: [facility] ENDOSCOPY;  Service: Gastroenterology;  Laterality: N/A;   ESOPHAGOGASTRODUODENOSCOPY (EGD) WITH PROPOFOL N/A 03/19/2016   Procedure: ESOPHAGOGASTRODUODENOSCOPY (EGD) WITH PROPOFOL;  Surgeon: Christena Deem, MD;  Location: [facility] ENDOSCOPY;  Service: Endoscopy;  Laterality: N/A;    Allergies: Patient has no known allergies.  Medications: Prior to Admission medications   Medication Sig Start Date End Date Taking? Authorizing Provider  amLODipine (NORVASC) 10 MG tablet Take 10 mg by mouth daily. 04/19/21  Yes [provider]  bisacodyl (DULCOLAX) 10 MG suppository Place 1 suppository (10 mg total) rectally daily as needed for up to 12 doses for moderate constipation. 12/15/22  Yes Trifan, Kermit Balo, MD  JARDIANCE 10 MG TABS tablet Take 10 mg by mouth daily. 01/09/23  Yes [provider]  lactulose (CHRONULAC) 10 GM/15ML solution Take 15-30 mLs (10-20 g total) by mouth 2 (two) times daily as needed for moderate constipation or severe constipation. 01/15/23  Yes Borders, Daryl Eastern, NP   magnesium hydroxide (MILK OF MAGNESIA) 400 MG/5ML suspension Take 5 mLs by mouth daily as needed for mild constipation.   Yes [provider]  metFORMIN (GLUCOPHAGE) 500 MG tablet Take 1,000 mg by mouth 2 (two) times daily with a meal. 09/18/22  Yes [provider]  ondansetron (ZOFRAN-ODT) 8 MG disintegrating tablet Take 1 tablet (8 mg total) by mouth every 8 (eight) hours as needed for nausea. 01/10/23  Yes Derwood Kaplan, MD  oxyCODONE (ROXICODONE) 5 MG immediate release tablet Take 1 tablet (5 mg total) by mouth every 8 (eight) hours as needed for up to 16 doses for breakthrough pain or severe pain. 01/07/23  Yes Pilar Jarvis, MD  pantoprazole (PROTONIX) 40 MG tablet Take 1 tablet (40 mg total) by mouth daily. 01/07/23 04/07/23 Yes Pilar Jarvis, MD  polyethylene glycol (MIRALAX / GLYCOLAX) 17 g packet Take 17 g by mouth daily.   Yes [provider]  potassium chloride SA (KLOR-CON M) 20 MEQ tablet TAKE 2 TABLETS BY MOUTH DAILY FOR POTASSIUM 10/25/22  Yes [provider]  pravastatin (PRAVACHOL) 20 MG tablet Take 20 mg by mouth daily. 02/06/21  Yes [provider]  senna-docusate (SENOKOT-S) 8.6-50 MG tablet Take 1 tablet by mouth daily as needed for up to 30 doses for mild constipation. 12/15/22  Yes Trifan, Kermit Balo, MD  tamsulosin (FLOMAX) 0.4 MG CAPS capsule Take 1 capsule by mouth daily. 08/21/22  Yes [provider]  GAVILYTE-G 236 g solution Take 4,000 mLs by mouth as directed. Patient not taking: Reported on 01/15/2023 12/10/22   [provider]  History reviewed. No pertinent family history.  Social History   Socioeconomic History   Marital status: Single    Spouse name: Not on file   Number of children: Not on file   Years of education: Not on file   Highest education level: Not on file  Occupational History   Not on file  Tobacco Use   Smoking status: Every Day    Current packs/day: 0.25    Average packs/day: 0.3  packs/day for 48.0 years (12.0 ttl pk-yrs)    Types: Cigarettes    Passive exposure: Current   Smokeless tobacco: Never  Vaping Use   Vaping status: Never Used  Substance and Sexual Activity   Alcohol use: Yes    Alcohol/week: 6.0 standard drinks of alcohol    Types: 6 Cans of beer per week   Drug use: No   Sexual activity: Not on file  Other Topics Concern   Not on file  Social History Narrative   Not on file   Social Determinants of Health   Financial Resource Strain: Low Risk  (11/27/2022)   Overall Financial Resource Strain (CARDIA)    Difficulty of Paying Living Expenses: Not very hard  Food Insecurity: No Food Insecurity (01/18/2023)   Hunger Vital Sign    Worried About Running Out of Food in the Last Year: Never true    Ran Out of Food in the Last Year: Never true  Recent Concern: Food Insecurity - Food Insecurity Present (11/27/2022)   Hunger Vital Sign    Worried About Running Out of Food in the Last Year: Sometimes true    Ran Out of Food in the Last Year: Never true  Transportation Needs: No Transportation Needs (01/18/2023)   PRAPARE - Administrator, Civil Service (Medical): No    Lack of Transportation (Non-Medical): No  Physical Activity: Not on file  Stress: No Stress Concern Present (11/27/2022)   [employer] of Occupational Health - Occupational Stress Questionnaire    Feeling of Stress : Only a little  Social Connections: Not on file    Code Status: Full code  Review of Systems: A 12 point ROS discussed and pertinent positives are indicated in the HPI above.  All other systems are negative.  Review of Systems  Constitutional:  Negative for chills and fever.  Respiratory:  Negative for chest tightness and shortness of breath.   Cardiovascular:  Negative for chest pain and leg swelling.  Gastrointestinal:  Positive for diarrhea. Negative for abdominal pain, nausea and vomiting.  Neurological:  Positive for headaches. Negative for  dizziness.  Psychiatric/Behavioral:  Negative for confusion.     Vital Signs: BP 116/72 (BP Location: Right Arm)   Pulse (!) 106   Temp 99.7 F (37.6 C) (Oral)   Resp 18   Ht 5\' 9"  (1.753 m)   Wt 141 lb 15.6 oz (64.4 kg)   SpO2 100%   BMI 20.97 kg/m     Physical Exam Vitals reviewed.  Constitutional:      General: He is not in acute distress. Cardiovascular:     Rate and Rhythm: Regular rhythm. Tachycardia present.     Pulses: Normal pulses.     Heart sounds: Normal heart sounds.  Pulmonary:     Effort: Pulmonary effort is normal.     Breath sounds: Normal breath sounds.  Abdominal:     Palpations: Abdomen is soft.     Tenderness: There is no abdominal tenderness.  Musculoskeletal:     Right  lower leg: No edema.     Left lower leg: No edema.  Skin:    General: Skin is warm and dry.  Neurological:     Mental Status: He is alert and oriented to person, place, and time.  Psychiatric:        Mood and Affect: Mood normal.        Behavior: Behavior normal.        Thought Content: Thought content normal.        Judgment: Judgment normal.     Imaging: DG UGI W SINGLE CM (SOL OR THIN BA)  Result Date: 01/20/2023 CLINICAL DATA:  Esophageal dysphagia, concern for possible stricture, esophagitis, gastritis, dysmotility. EXAM: DG UGI W SINGLE CM TECHNIQUE: Single contrast examination was then performed using thin liquid barium. This exam was performed by Mina Marble, PA-C , and was supervised and interpreted by Dr. Elige Ko. FLUOROSCOPY: Radiation Exposure Index (as provided by the fluoroscopic device): 15.00 mGy Kerma COMPARISON:  None Available. FINDINGS: Esophagus:  Normal appearance. Esophageal motility: Mild dysmotility with tertiary contractions and stasis of contrast material visualized. Gastroesophageal reflux: Small volume spontaneous gastroesophageal reflux visualized, unable to provoke gastroesophageal reflux Ingested 13mm barium tablet: Passed normally Stomach:  Normal appearance. No hiatal hernia. Gastric emptying: Normal. Duodenum:  Distal duodenal diverticulum visualized Other:  None. IMPRESSION: 1.  Mild esophageal dysmotility 2.  Small volume gastroesophageal reflux 3.  Distal duodenal diverticulum Electronically Signed   By: Elige Ko M.D.   On: 01/20/2023 23:30   DG Chest 2 View  Result Date: 01/18/2023 CLINICAL DATA:  Weakness and decreased appetite.  Hematemesis. EXAM: CHEST - 2 VIEW COMPARISON:  Chest radiograph dated 01/07/2023 FINDINGS: Normal lung volumes. Bilateral lower lung interstitial opacities. No pleural effusion or pneumothorax. The heart size and mediastinal contours are within normal limits. No acute osseous abnormality. IMPRESSION: Bilateral lower lung interstitial opacities, which may represent atelectasis or infection. Electronically Signed   By: Agustin Cree M.D.   On: 01/18/2023 14:09   DG Abd 1 View  Result Date: 01/15/2023 CLINICAL DATA:  abd pain/constipation, r/o obstructive gas pattern EXAM: ABDOMEN - 1 VIEW COMPARISON:  CT AP 01/07/23 FINDINGS: Nonobstructive bowel gas pattern. Large colonic stool burden. Pelvic phleboliths. Rounded density projecting over the L4-L5 vertebral body levels is nonspecific, but favored to represent patient's known fat containing umbilical hernia. Assessment of the sacrum is slightly limited due to overlying bowel gas. No acute osseous abnormality. IMPRESSION: Nonobstructive bowel gas pattern. Large colonic stool burden. Electronically Signed   By: Lorenza Cambridge M.D.   On: 01/15/2023 13:02   CT Angio Chest PE W/Cm &/Or Wo Cm  Result Date: 01/07/2023 CLINICAL DATA:  pt reports since last night having left sided CP, nonradiating accomp by nausea; Abdominal pain, acute, nonlocalized History of metastatic prostate cancer. EXAM: CT ANGIOGRAPHY CHEST CT ABDOMEN AND PELVIS WITH CONTRAST TECHNIQUE: Multidetector CT imaging of the chest was performed using the standard protocol during bolus administration of  intravenous contrast. Multiplanar CT image reconstructions and MIPs were obtained to evaluate the vascular anatomy. Multidetector CT imaging of the abdomen and pelvis was performed using the standard protocol during bolus administration of intravenous contrast. RADIATION DOSE REDUCTION: This exam was performed according to the departmental dose-optimization program which includes automated exposure control, adjustment of the mA and/or kV according to patient size and/or use of iterative reconstruction technique. CONTRAST:  OMNIPAQUE IOHEXOL 350 MG/ML SOLN COMPARISON:  Chest XR, 01/07/2023. CTA chest, 11/23/2022. CT AP, 03/26/2023. PET-CT, 12/18/2022. FINDINGS: CTA  CHEST FINDINGS Cardiovascular: Satisfactory opacification of the pulmonary arteries to the segmental level. No segmental or larger pulmonary embolus. normal heart size. No pericardial effusion. Mediastinum/Nodes: Interval enlargement in mediastinal adenopathy, with representative prevascular and precarinal lymph nodes measuring approximately 2.5 x 1.7 cm and 2.5 x 3.8 cm (AP by transaxial) respectively. No enlarged axillary lymph nodes. Thyroid gland, trachea, and esophagus demonstrate no significant findings. Lungs/Pleura: Mild basilar atelectasis. Lungs are otherwise clear without focal consolidation, mass or suspicious pulmonary nodule. No pleural effusion or pneumothorax. Musculoskeletal: Mild gynecomastia. No acute chest wall abnormality. Multifocal sclerotic osseous lesions within the imaged thoracic spine. Review of the MIP images confirms the above findings. CT ABDOMEN and PELVIS FINDINGS Hepatobiliary: Normal volume of liver and contour. Increased number and conspicuity of small, multifocal rounded and hypodense lesions scattered within liver contracted gallbladder with dense gallstones. No gallbladder wall thickening, or biliary dilatation. Pancreas: Relatively similar size and appearance ~ 1 cm hypodense pancreatic cystic lesion at the  uncinate. No pancreatic ductal dilatation or surrounding inflammatory changes. Spleen: Normal in size without focal abnormality. Adrenals/Urinary Tract: Adrenal glands are unremarkable. Kidneys are normal, without renal calculi, focal lesion, or hydronephrosis. Bladder is decompressed by Foley catheter. Stomach/Bowel: Stomach is within normal limits. Appendix is not definitively visualized. Nonobstructed small bowel. Nondilated colon. Severe burden of colonic diverticulosis, involving the descending and transverse colon. No evidence of bowel wall thickening, distention, or acute inflammatory change. Vascular/Lymphatic: No significant vascular findings are present. No enlarged abdominal or pelvic lymph nodes. Reproductive: Irregular nodular contour of prostate. Other: No abdominopelvic ascites or intraperitoneal free air. Postsurgical changes of prior laparotomy. Fat-containing, umbilical ventral hernia. Musculoskeletal: No acute osseous finding teams or displaced fracture. Multifocal osseous sclerotic lesions within the imaged pelvis and axial spine. Review of the MIP images confirms the above findings. IMPRESSION: Since CT CAP dated 12/15/2022; 1. No segmental or larger pulmonary embolus. 2. No acute abdominopelvic process. 3. Progression of metastatic disease, with enlargement in mediastinal adenopathy within chest, and increased number and conspicuity of small multifocal lesions within liver, as described in detail above. Electronically Signed   By: Roanna Banning M.D.   On: 01/07/2023 09:29   CT ABDOMEN PELVIS W CONTRAST  Result Date: 01/07/2023 CLINICAL DATA:  pt reports since last night having left sided CP, nonradiating accomp by nausea; Abdominal pain, acute, nonlocalized History of metastatic prostate cancer. EXAM: CT ANGIOGRAPHY CHEST CT ABDOMEN AND PELVIS WITH CONTRAST TECHNIQUE: Multidetector CT imaging of the chest was performed using the standard protocol during bolus administration of intravenous  contrast. Multiplanar CT image reconstructions and MIPs were obtained to evaluate the vascular anatomy. Multidetector CT imaging of the abdomen and pelvis was performed using the standard protocol during bolus administration of intravenous contrast. RADIATION DOSE REDUCTION: This exam was performed according to the departmental dose-optimization program which includes automated exposure control, adjustment of the mA and/or kV according to patient size and/or use of iterative reconstruction technique. CONTRAST:  OMNIPAQUE IOHEXOL 350 MG/ML SOLN COMPARISON:  Chest XR, 01/07/2023. CTA chest, 11/23/2022. CT AP, 03/26/2023. PET-CT, 12/18/2022. FINDINGS: CTA CHEST FINDINGS Cardiovascular: Satisfactory opacification of the pulmonary arteries to the segmental level. No segmental or larger pulmonary embolus. normal heart size. No pericardial effusion. Mediastinum/Nodes: Interval enlargement in mediastinal adenopathy, with representative prevascular and precarinal lymph nodes measuring approximately 2.5 x 1.7 cm and 2.5 x 3.8 cm (AP by transaxial) respectively. No enlarged axillary lymph nodes. Thyroid gland, trachea, and esophagus demonstrate no significant findings. Lungs/Pleura: Mild basilar atelectasis. Lungs are otherwise  clear without focal consolidation, mass or suspicious pulmonary nodule. No pleural effusion or pneumothorax. Musculoskeletal: Mild gynecomastia. No acute chest wall abnormality. Multifocal sclerotic osseous lesions within the imaged thoracic spine. Review of the MIP images confirms the above findings. CT ABDOMEN and PELVIS FINDINGS Hepatobiliary: Normal volume of liver and contour. Increased number and conspicuity of small, multifocal rounded and hypodense lesions scattered within liver contracted gallbladder with dense gallstones. No gallbladder wall thickening, or biliary dilatation. Pancreas: Relatively similar size and appearance ~ 1 cm hypodense pancreatic cystic lesion at the uncinate. No  pancreatic ductal dilatation or surrounding inflammatory changes. Spleen: Normal in size without focal abnormality. Adrenals/Urinary Tract: Adrenal glands are unremarkable. Kidneys are normal, without renal calculi, focal lesion, or hydronephrosis. Bladder is decompressed by Foley catheter. Stomach/Bowel: Stomach is within normal limits. Appendix is not definitively visualized. Nonobstructed small bowel. Nondilated colon. Severe burden of colonic diverticulosis, involving the descending and transverse colon. No evidence of bowel wall thickening, distention, or acute inflammatory change. Vascular/Lymphatic: No significant vascular findings are present. No enlarged abdominal or pelvic lymph nodes. Reproductive: Irregular nodular contour of prostate. Other: No abdominopelvic ascites or intraperitoneal free air. Postsurgical changes of prior laparotomy. Fat-containing, umbilical ventral hernia. Musculoskeletal: No acute osseous finding teams or displaced fracture. Multifocal osseous sclerotic lesions within the imaged pelvis and axial spine. Review of the MIP images confirms the above findings. IMPRESSION: Since CT CAP dated 12/15/2022; 1. No segmental or larger pulmonary embolus. 2. No acute abdominopelvic process. 3. Progression of metastatic disease, with enlargement in mediastinal adenopathy within chest, and increased number and conspicuity of small multifocal lesions within liver, as described in detail above. Electronically Signed   By: Roanna Banning M.D.   On: 01/07/2023 09:29   DG Chest Portable 1 View  Result Date: 01/07/2023 CLINICAL DATA:  Chest pain EXAM: PORTABLE CHEST 1 VIEW COMPARISON:  11/23/2022 FINDINGS: Artifact from EKG leads. Rounded density over the left mid lung is attributed to the anterior fifth rib based on prior CT, there is known prostate cancer and bone metastases. There is no edema, consolidation, effusion, or pneumothorax. Normal heart size and mediastinal contours. IMPRESSION: No  active disease. Electronically Signed   By: Tiburcio Pea M.D.   On: 01/07/2023 07:41    Labs:  CBC: Recent Labs    01/20/23 0327 01/21/23 0523 01/22/23 0455 01/23/23 0544  WBC 10.6* 14.4* 19.2*  19.0* 18.4*  HGB 7.9* 8.7* 8.6*  8.7* 8.2*  HCT 23.8* 26.2* 27.9*  26.1* 24.6*  PLT 651* 673* 701*  681* 625*    COAGS: No results for input(s): "INR", "APTT" in the last 8760 hours.  BMP: Recent Labs    01/20/23 0327 01/21/23 0523 01/22/23 0455 01/23/23 0544  NA 126* 126* 129* 126*  K 3.3* 3.8 3.2* 3.6  CL 86* 88* 90* 91*  CO2 29 26 26 24   GLUCOSE 192* 189* 168* 191*  BUN 7* 6* <5* 6*  CALCIUM 9.1 9.1 9.1 8.7*  CREATININE 0.68 0.61 0.62 0.71  GFRNONAA >60 >60 >60 >60    LIVER FUNCTION TESTS: Recent Labs    01/07/23 0724 01/10/23 1027 01/15/23 1035 01/18/23 0952  BILITOT 0.5 0.9 0.7 1.1  AST 26 16 21 17   ALT 8 9 8 8   ALKPHOS 120 118 105 125  PROT 8.6* 7.8 8.4* 8.3*  ALBUMIN 3.1* 2.6* 3.0* 2.6*    TUMOR MARKERS: No results for input(s): "AFPTM", "CEA", "CA199", "CHROMGRNA" in the last 8760 hours.  Assessment and Plan:  Louis Gardner is  a 62 yo male being seen today in relation to acute anemia. Patient has been under the care of Dr Cathie Hoops from Oncology who has referred patient to IR for image-guided bone marrow biopsy to further evaluate their acute anemia. Case reviewed and approved by Dr Loreta Ave and scheduled to proceed on 01/24/23. Patient has been made NPO at midnight.  Risks and benefits of image-guided bone marrow biopsy was discussed with the patient and/or patient's family including, but not limited to bleeding, infection, damage to adjacent structures or low yield requiring additional tests.  All of the questions were answered and there is agreement to proceed.  Consent signed and in chart.   Thank you for this interesting consult.  I greatly enjoyed meeting Louis Gardner and look forward to participating in their care.  A copy of this report  was sent to the requesting provider on this date.  Electronically Signed: Kennieth Francois, PA-C 01/23/2023, 3:57 PM   I spent a total of 20 Minutes in face to face in clinical consultation, greater than 50% of which was counseling/coordinating care for acute anemia.

## 2023-01-23 NOTE — Progress Notes (Signed)
[facility] GI inpatient brief Progress Note  Patient seen for f/u dysphagia. Patient currently eating spaghetti with bread with little to no dysphagia.  Vitals:   01/23/23 0815 01/23/23 1534  BP: 117/78 116/72  Pulse: 97 (!) 106  Resp: 16 18  Temp: 98.6 F (37 C) 99.7 F (37.6 C)  SpO2: 100% 100%    HEENT: Ewa Gentry/AT. PERRLA. EOMI. External ear exam normal. Neck: Supple without adenopathy. Trachea appears midline. Chest: Clear to auscultation. No wheezes or rales. Cardiovascular: Regular rate, normal S1 and S2 heart sounds. No gallop. Abdomen: Soft, non-tender without masses, hepatosplenomegaly or rigidity. Bowel sounds present. Extremities: No atrophy. Negative for edema. Pulses 2+ bilaterally.  Neuro: Alert and oriented x 3. Nonfocal. Skin: No obvious rashes. No pallor. Psychiatric: Mood is euthymic. Appropriately attentive and conversive. Oriented x 3.     Labs:    Latest Ref Rng & Units 01/23/2023    5:44 AM 01/22/2023    4:55 AM 01/21/2023    5:23 AM  CBC  WBC 4.0 - 10.5 K/uL 18.4  19.0    19.2  14.4   Hemoglobin 13.0 - 17.0 g/dL 8.2  8.7    8.6  8.7   Hematocrit 39.0 - 52.0 % 24.6  26.1    27.9  26.2   Platelets 150 - 400 K/uL 625  681    701  673     CMP     Component Value Date/Time   NA 126 (L) 01/23/2023 0544   NA 138 08/04/2013 0842   K 3.6 01/23/2023 0544   K 3.9 08/04/2013 0842   CL 91 (L) 01/23/2023 0544   CL 106 08/04/2013 0842   CO2 24 01/23/2023 0544   CO2 28 08/04/2013 0842   GLUCOSE 191 (H) 01/23/2023 0544   GLUCOSE 116 (H) 08/04/2013 0842   BUN 6 (L) 01/23/2023 0544   BUN 5 (L) 08/04/2013 0842   CREATININE 0.71 01/23/2023 0544   CREATININE 0.77 01/15/2023 1035   CREATININE 1.32 (H) 08/04/2013 0842   CALCIUM 8.7 (L) 01/23/2023 0544   CALCIUM 8.4 (L) 08/04/2013 0842   PROT 8.3 (H) 01/18/2023 0952   PROT 8.8 (H) 08/02/2013 1135   ALBUMIN 2.6 (L) 01/18/2023 0952   ALBUMIN 4.2 08/02/2013 1135   AST 17 01/18/2023 0952   AST 21 01/15/2023  1035   ALT 8 01/18/2023 0952   ALT 8 01/15/2023 1035   ALT 58 08/02/2013 1135   ALKPHOS 125 01/18/2023 0952   ALKPHOS 62 08/02/2013 1135   BILITOT 1.1 01/18/2023 0952   BILITOT 0.7 01/15/2023 1035   GFRNONAA >60 01/23/2023 0544   GFRNONAA >60 01/15/2023 1035   GFRNONAA >60 08/04/2013 0842   GFRAA >60 11/24/2019 0915   GFRAA >60 08/04/2013 0842      Impression:  Dysphagia, resolving, possible secondary to dysmotility 2.   Iron deficiency anemia 3.   Metastatic prostate cancer   Plan:   Further recommendations per Oncology. 2.    GI will sign off for now. 3.    Please call back if we can help     Thank you  T. Bing Plume, M.D. ABIM Diplomate in Gastroenterology [facility] [phone] - Cell

## 2023-01-23 NOTE — Progress Notes (Signed)
Mobility Specialist - Progress Note   01/23/23 1104  Mobility  Activity Ambulated with assistance in hallway  Level of Assistance Standby assist, set-up cues, supervision of patient - no hands on  Assistive Device None  Distance Ambulated (ft) 40 ft  Activity Response Tolerated well  $Mobility charge 1 Mobility  Mobility Specialist Start Time (ACUTE ONLY) 1059  Mobility Specialist Stop Time (ACUTE ONLY) 1103  Mobility Specialist Time Calculation (min) (ACUTE ONLY) 4 min   Pt supine upon entry, utilizing RA. Pt agreeable to OOB amb this date, denied pain. Pt completed bed mob and STS indep. Pt amb 20 ft in the hallway MinG-SBA before opting to return to the room. Pt left supine with needs within reach.  Zetta Bills Mobility Specialist 01/23/23 11:06 AM

## 2023-01-23 NOTE — Plan of Care (Signed)

## 2023-01-24 ENCOUNTER — Ambulatory Visit: Payer: Medicaid Other | Admitting: Urology

## 2023-01-24 ENCOUNTER — Telehealth: Payer: Self-pay

## 2023-01-24 ENCOUNTER — Inpatient Hospital Stay: Payer: Medicaid Other | Admitting: Radiology

## 2023-01-24 ENCOUNTER — Encounter: Payer: Self-pay | Admitting: Hospitalist

## 2023-01-24 DIAGNOSIS — D500 Iron deficiency anemia secondary to blood loss (chronic): Secondary | ICD-10-CM | POA: Diagnosis not present

## 2023-01-24 DIAGNOSIS — E871 Hypo-osmolality and hyponatremia: Secondary | ICD-10-CM | POA: Diagnosis not present

## 2023-01-24 DIAGNOSIS — R531 Weakness: Secondary | ICD-10-CM | POA: Diagnosis not present

## 2023-01-24 DIAGNOSIS — K5900 Constipation, unspecified: Secondary | ICD-10-CM

## 2023-01-24 DIAGNOSIS — D508 Other iron deficiency anemias: Secondary | ICD-10-CM

## 2023-01-24 DIAGNOSIS — R1319 Other dysphagia: Secondary | ICD-10-CM | POA: Diagnosis not present

## 2023-01-24 DIAGNOSIS — E440 Moderate protein-calorie malnutrition: Secondary | ICD-10-CM

## 2023-01-24 HISTORY — PX: IR CT BONE TROCAR/NEEDLE BIOPSY DEEP: IMG941

## 2023-01-24 HISTORY — PX: IR BONE MARROW BIOPSY & ASPIRATION: IMG5727

## 2023-01-24 LAB — CBC
HCT: 22.1 % — ABNORMAL LOW (ref 39.0–52.0)
Hemoglobin: 7.6 g/dL — ABNORMAL LOW (ref 13.0–17.0)
MCH: 24.2 pg — ABNORMAL LOW (ref 26.0–34.0)
MCHC: 34.4 g/dL (ref 30.0–36.0)
MCV: 70.4 fL — ABNORMAL LOW (ref 80.0–100.0)
Platelets: 586 10*3/uL — ABNORMAL HIGH (ref 150–400)
RBC: 3.14 MIL/uL — ABNORMAL LOW (ref 4.22–5.81)
RDW: 17 % — ABNORMAL HIGH (ref 11.5–15.5)
WBC: 16.9 10*3/uL — ABNORMAL HIGH (ref 4.0–10.5)
nRBC: 0.2 % (ref 0.0–0.2)

## 2023-01-24 LAB — BASIC METABOLIC PANEL
Anion gap: 11 (ref 5–15)
BUN: 8 mg/dL (ref 8–23)
CO2: 25 mmol/L (ref 22–32)
Calcium: 8.4 mg/dL — ABNORMAL LOW (ref 8.9–10.3)
Chloride: 89 mmol/L — ABNORMAL LOW (ref 98–111)
Creatinine, Ser: 0.76 mg/dL (ref 0.61–1.24)
GFR, Estimated: >60 mL/min (ref >60)
Glucose, Bld: 165 mg/dL — ABNORMAL HIGH (ref 70–99)
Potassium: 3.7 mmol/L (ref 3.5–5.1)
Sodium: 125 mmol/L — ABNORMAL LOW (ref 135–145)

## 2023-01-24 LAB — MAGNESIUM: Magnesium: 1.9 mg/dL (ref 1.7–2.4)

## 2023-01-24 LAB — MISC LABCORP TEST (SEND OUT): Labcorp test code: 144000

## 2023-01-24 MED ORDER — LIDOCAINE HCL 1 % IJ SOLN
10.0000 mL | Freq: Once | INTRAMUSCULAR | Status: AC
  Administered 2023-01-24: 10 mL via INTRADERMAL
  Filled 2023-01-24: qty 10

## 2023-01-24 MED ORDER — LIDOCAINE HCL 1 % IJ SOLN
INTRAMUSCULAR | Status: AC
  Filled 2023-01-24: qty 20

## 2023-01-24 MED ORDER — OXYCODONE HCL 5 MG PO TABS
5.0000 mg | ORAL_TABLET | Freq: Three times a day (TID) | ORAL | 0 refills | Status: DC | PRN
Start: 2023-01-24 — End: 2023-02-12

## 2023-01-24 MED ORDER — FENTANYL CITRATE (PF) 100 MCG/2ML IJ SOLN
INTRAMUSCULAR | Status: AC
  Filled 2023-01-24: qty 2

## 2023-01-24 MED ORDER — ENSURE ENLIVE PO LIQD: 237.0000 mL | Freq: Three times a day (TID) | ORAL | 12 refills | Status: DC

## 2023-01-24 MED ORDER — MIDAZOLAM HCL 2 MG/2ML IJ SOLN
INTRAMUSCULAR | Status: AC
  Filled 2023-01-24: qty 2

## 2023-01-24 MED ORDER — LIDOCAINE 1 % OPTIME INJ - NO CHARGE
10.0000 mL | Freq: Once | INTRAMUSCULAR | Status: AC
  Administered 2023-01-24: 10 mL via INTRADERMAL

## 2023-01-24 MED ORDER — HEPARIN SOD (PORK) LOCK FLUSH 100 UNIT/ML IV SOLN
INTRAVENOUS | Status: AC
  Filled 2023-01-24: qty 5

## 2023-01-24 MED ORDER — FENTANYL CITRATE (PF) 100 MCG/2ML IJ SOLN
INTRAMUSCULAR | Status: AC | PRN
  Administered 2023-01-24: 50 ug via INTRAVENOUS
  Administered 2023-01-24 (×2): 25 ug via INTRAVENOUS

## 2023-01-24 MED ORDER — MIDAZOLAM HCL 2 MG/2ML IJ SOLN
INTRAMUSCULAR | Status: AC | PRN
  Administered 2023-01-24: .5 mg via INTRAVENOUS
  Administered 2023-01-24: 1 mg via INTRAVENOUS
  Administered 2023-01-24: .5 mg via INTRAVENOUS

## 2023-01-24 NOTE — Telephone Encounter (Signed)
-----   Message from Rickard Patience sent at 01/24/2023  2:04 PM EDT ----- He is going home today.  Please arrange him to follow up lab- cbc hold tube + Venofer early next week.  I will see him in 2 weeks lab MD cbc hold tube Venofer  zy

## 2023-01-24 NOTE — Progress Notes (Signed)
Carmie End to be D/C'd home per MD order. Discussed with the patient and all questions fully answered.  Skin clean, dry and intact without evidence of skin break down, no evidence of skin tears noted. Foley catheter intact. New leg bag attached per patient request. IV catheter discontinued intact. Site without signs and symptoms of complications. Dressing and pressure applied.  An After Visit Summary was printed and given to the patient.  Patient escorted via WC, and D/C home via private auto.  Jon Gills  01/24/2023

## 2023-01-24 NOTE — Sedation Documentation (Signed)
Pt post BMB, dresssings on right and left coccyx, Versed 2 mg and fentanyl 100 mcg iv.

## 2023-01-24 NOTE — Hospital Course (Signed)
Louis Gardner is a 62 y.o. 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  Patient was  unable to swallow and keep solid foods down.  Reportedly specks of blood in vomitus.  Gastroenterology was consulted and upper GI series shows only mild esophageal dysmotility.  Patient was tried on soft diet and was able to tolerate well.  No plan for any luminal study per GI.    Patient has an advance prostate cancer with multiple mets.  He follow-up with outpatient oncology and palliative care from cancer center.  Due to concern of marrow involvement by his cancer, a bone marrow biopsy was done on 01/24/2023 by IR and his oncologist will follow-up.  BCR labs are still pending which will be followed up by oncology.  Patient has a chronic Foley catheter in place which was exchanged in ED.  Patient will continue with Foley catheter and Flomax at home.  Patient has stable microcytic anemia which is being managed by oncology.  Patient was also found to have leukocytosis which seems stable with slow improvement and thrombocytosis which seems chronic.  BCR labs are pending and will be followed up by oncology. No sign of any infection so antibiotics were held.  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  Hyponatremia labs were equally vocal.  Patient is high risk for readmission and mortality based on life limiting underlying comorbidities.  Patient will continue on current medications and follow-up with his providers as outpatient for further management.

## 2023-01-24 NOTE — Progress Notes (Signed)
Nutrition Follow-up  DOCUMENTATION CODES:   Non-severe (moderate) malnutrition in context of chronic illness  INTERVENTION:   -Continue MVI with minerals daily -Continue Ensure Enlive po TID, each supplement provides 350 kcal and 20 grams of protein -Continue Magic cup TID with meals, each supplement provides 290 kcal and 9 grams of protein  -Downgrade diet to dysphagia 3 diet for ease of intake  NUTRITION DIAGNOSIS:   Moderate Malnutrition related to chronic illness (metastatic prostate cancer) as evidenced by mild fat depletion, mild muscle depletion, percent weight loss.  Ongoing  GOAL:   Patient will meet greater than or equal to 90% of their needs  Unmet  MONITOR:   PO intake, Supplement acceptance, Diet advancement  REASON FOR ASSESSMENT:   Malnutrition Screening Tool    ASSESSMENT:   Pt with PMH of HTN, depression, sleep apnea, OA, and castrate resistant prostate cancer with metastatic to bone, liver, and lymph nodes presented for chief complaint of generalized weakness and inability to swallow solid food.  9/17- s/p UGI series- revealed esophageal dysmotility, no strictures, rings or masses noted, duodenal diverticulum, mild GERD present, barium pill passed through gastroesophageal junction without difficulty, advanced to soft diet  9/20- s/p BMBx right and left posterior ilium     Reviewed I/O's: -225 ml x 24 hours and -2 L since admission  UOP: 225 ml x 24 hours  Pt with very poor oral intake. Noted meal completions 0%. Pt is on a soft diet., Secondary to esophageal dysmotility, may benefit from a mechanically altered diet (ex dysphagia 3) for ease of intake.   Wt has been stable since admission.   Medications reviewed and include senokot.    Labs reviewed: Na: 125.    Diet Order:   Diet Order             DIET SOFT Room service appropriate? Yes; Fluid consistency: Thin  Diet effective now                   EDUCATION NEEDS:   Education  needs have been addressed  Skin:  Skin Assessment: Reviewed RN Assessment  Last BM:  01/23/23  Height:   Ht Readings from Last 1 Encounters:  01/24/23 5\' 9"  (1.753 m)    Weight:   Wt Readings from Last 1 Encounters:  01/24/23 64.4 kg    Ideal Body Weight:  72.7 kg  BMI:  Body mass index is 20.97 kg/m.  Estimated Nutritional Needs:   Kcal:  1900-2100  Protein:  100-115 grams  Fluid:  > 1.9 L    Levada Schilling, RD, LDN, CDCES Registered Dietitian II Certified Diabetes Care and Education Specialist Please refer to [facility] for RD and/or RD on-call/weekend/after hours pager

## 2023-01-24 NOTE — Telephone Encounter (Signed)
Looks like inpatient RN called to schedule appt but Dr. Cathie Hoops would like for him to follow up in 2 weeks instead.   Please schedule and update pt on appt details:   Cancel appt on 9/25 Lab/venofer early next week (cbc,hold tube) Lab/MD/venofer in 2 weeks (cbc,hold tube)

## 2023-01-24 NOTE — Discharge Summary (Addendum)
Physician Discharge Summary   Patient: Louis Gardner MRN: [id number] DOB: [birth year]  Admit date:     01/18/2023  Discharge date: 01/24/23  Discharge Physician: Arnetha Courser   PCP: [facility]   Recommendations at discharge:  Please obtain CBC and BMP within a week Follow-up with oncology Pending bone marrow and lab results for persistent leukocytosis and concern of bone marrow involvement with his prior advanced prostatic cancer. Follow-up with gastroenterology  Discharge Diagnoses: Principal Problem:   Dysphagia Active Problems:   Hyponatremia   SIRS (systemic inflammatory response syndrome) (HCC)   Malnutrition of moderate degree   Leucocytosis   Thrombocytosis   Weakness   Hospital Course: Louis Gardner is a 62 y.o. 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  Patient was  unable to swallow and keep solid foods down.  Reportedly specks of blood in vomitus.  Gastroenterology was consulted and upper GI series shows only mild esophageal dysmotility.  Patient was tried on soft diet and was able to tolerate well.  No plan for any luminal study per GI.    Patient has an advance prostate cancer with multiple mets.  He follow-up with outpatient oncology and palliative care from cancer center.  Due to concern of marrow involvement by his cancer, a bone marrow biopsy was done on 01/24/2023 by IR and his oncologist will follow-up.  BCR labs are still pending which will be followed up by oncology.  Patient has a chronic Foley catheter in place which was exchanged in ED.  Patient will continue with Foley catheter and Flomax at home.  Patient has stable microcytic anemia which is being managed by oncology.  Patient was also found to have leukocytosis which seems stable with slow improvement and thrombocytosis which seems chronic.   BCR labs are pending and will be followed up by oncology. No sign of any infection so antibiotics were held.  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  Hyponatremia labs were equally vocal.  Patient is high risk for readmission and mortality based on life limiting underlying comorbidities.  Patient will continue on current medications and follow-up with his providers as outpatient for further management.      Pain control - [employer] Controlled Substance Reporting System database was reviewed. and patient was instructed, not to drive, operate heavy machinery, perform activities at heights, swimming or participation in water activities or provide baby-sitting services while on Pain, Sleep and Anxiety Medications; until their outpatient Physician has advised to do so again. Also recommended to not to take more than prescribed Pain, Sleep and Anxiety Medications.  Consultants: Oncology.  Gastroenterology Procedures performed: Bone marrow biopsy, upper GI series Disposition: Home Diet recommendation:  Discharge Diet Orders (From admission, onward)     Start     Ordered   01/24/23 0000  Diet - low sodium heart healthy        01/24/23 1214           Dysphagia type 3 thin Liquid DISCHARGE MEDICATION: Allergies as of 01/24/2023   No Known Allergies      Medication List     STOP taking these medications    GaviLyte-G 236 g solution Generic drug: polyethylene glycol       TAKE these medications    amLODipine 10 MG tablet Commonly known as: NORVASC Take  10 mg by mouth daily.   bisacodyl 10 MG suppository Commonly known as: Dulcolax Place 1 suppository (10 mg total) rectally daily as needed for up to 12 doses for moderate constipation.   feeding supplement Liqd Take 237 mLs by mouth 3 (three) times daily between meals.   Jardiance 10 MG Tabs tablet Generic drug: empagliflozin Take 10 mg by  mouth daily.   lactulose 10 GM/15ML solution Commonly known as: CHRONULAC Take 15-30 mLs (10-20 g total) by mouth 2 (two) times daily as needed for moderate constipation or severe constipation.   magnesium hydroxide 400 MG/5ML suspension Commonly known as: MILK OF MAGNESIA Take 5 mLs by mouth daily as needed for mild constipation.   metFORMIN 500 MG tablet Commonly known as: GLUCOPHAGE Take 1,000 mg by mouth 2 (two) times daily with a meal.   ondansetron 8 MG disintegrating tablet Commonly known as: ZOFRAN-ODT Take 1 tablet (8 mg total) by mouth every 8 (eight) hours as needed for nausea.   oxyCODONE 5 MG immediate release tablet Commonly known as: Roxicodone Take 1 tablet (5 mg total) by mouth every 8 (eight) hours as needed for up to 16 doses for breakthrough pain or severe pain.   pantoprazole 40 MG tablet Commonly known as: PROTONIX Take 1 tablet (40 mg total) by mouth daily.   polyethylene glycol 17 g packet Commonly known as: MIRALAX / GLYCOLAX Take 17 g by mouth daily.   potassium chloride SA 20 MEQ tablet Commonly known as: KLOR-CON M TAKE 2 TABLETS BY MOUTH DAILY FOR POTASSIUM   pravastatin 20 MG tablet Commonly known as: PRAVACHOL Take 20 mg by mouth daily.   senna-docusate 8.6-50 MG tablet Commonly known as: Senokot-S Take 1 tablet by mouth daily as needed for up to 30 doses for mild constipation.   tamsulosin 0.4 MG Caps capsule Commonly known as: FLOMAX Take 1 capsule by mouth daily.               Discharge Care Instructions  (From admission, onward)           Start     Ordered   01/24/23 0000  No dressing needed        01/24/23 1214            Follow-up Information     [facility] Go on 02/12/2023.   Why: @11 :20am Contact information: [address] [place] [address] [phone]         Rickard Patience, MD. Go on 01/29/2023.   Specialty: Oncology Why: @9 :30am Contact information: [address]  [place] [address] [phone]                Discharge Exam: Louis Gardner Weights   01/18/23 2145 01/24/23 0804  Weight: 64.4 kg 64.4 kg   General.  Frail and malnourished gentleman, in no acute distress. Pulmonary.  Lungs clear bilaterally, normal respiratory effort. CV.  Regular rate and rhythm, no JVD, rub or murmur. Abdomen.  Soft, nontender, nondistended, BS positive. CNS.  Alert and oriented .  No focal neurologic deficit. Extremities.  No edema, no cyanosis, pulses intact and symmetrical. Psychiatry.  Judgment and insight appears normal.   Condition at discharge: stable  The results of significant diagnostics from this hospitalization (including imaging, microbiology, ancillary and laboratory) are listed below for reference.   Imaging Studies: IR BONE MARROW BIOPSY & ASPIRATION  Result Date: 01/24/2023 INDICATION: Acute anemia; history of prostate cancer with metastatic disease EXAM: 1. Bone lesion core needle biopsy  of the right ilium using fluoroscopic guidance 2. Bone marrow aspiration and core biopsy of the left ilium using fluoroscopic guidance MEDICATIONS: None. ANESTHESIA/SEDATION: Moderate (conscious) sedation was employed during this procedure. A total of Versed 2 mg and Fentanyl 100 mcg was administered intravenously. Moderate Sedation Time: 17 minutes. The patient's level of consciousness and vital signs were monitored continuously by radiology nursing throughout the procedure under my direct supervision. FLUOROSCOPY TIME:  Fluoroscopy Time: 1.3 minutes (9 mGy) COMPLICATIONS: None immediate. PROCEDURE: Informed written consent was obtained from the patient after a thorough discussion of the procedural risks, benefits and alternatives. All questions were addressed. Maximal Sterile Barrier Technique was utilized including caps, mask, sterile gowns, sterile gloves, sterile drape, hand hygiene and skin antiseptic. A timeout was performed prior to the initiation of the  procedure. The patient was placed prone on the exam table. Limited fluoroscopy of the pelvis was performed for planning purposes. Skin entry site was marked, and the overlying skin was prepped and draped in the standard sterile fashion. Local analgesia was obtained with 1% lidocaine. Using fluoroscopic guidance, an 11 gauge needle was advanced just deep to the cortex of the right posterior ilium. Subsequently, bone marrow aspiration was attempted, yielding a dry tap. Core biopsy was performed, demonstrating exceedingly sclerotic bone. View of recent cross-sectional imaging demonstrated that needle was likely within a sclerotic lesion given history of prostate cancer. Therefore, attention was turned to the left side to re-attempt a bone marrow aspiration and core biopsy. Skin entry site was again marked, and the overlying skin was prepped and draped in the standard sterile fashion. Local analgesia was obtained with 1% lidocaine. Using fluoroscopic guidance, an 11 gauge needle was advanced just deep to the cortex of the left posterior ilium. Subsequently, bone marrow aspiration and core biopsy was performed. Specimens were submitted to lab/pathology for handling. Hemostasis was achieved with manual pressure, and a clean dressing was placed. The patient tolerated the procedure well without immediate complication. IMPRESSION: 1. Successful core needle biopsy of sclerotic bone lesion in the right posterior ilium using fluoroscopic guidance. 2. Successful bone marrow aspiration and core biopsy of the left posterior ilium using fluoroscopic guidance. Electronically Signed   By: Olive Bass M.D.   On: 01/24/2023 09:32   IR CT BONE TROCAR/NEEDLE BIOPSY DEEP  Result Date: 01/24/2023 INDICATION: Acute anemia; history of prostate cancer with metastatic disease EXAM: 1. Bone lesion core needle biopsy of the right ilium using fluoroscopic guidance 2. Bone marrow aspiration and core biopsy of the left ilium using  fluoroscopic guidance MEDICATIONS: None. ANESTHESIA/SEDATION: Moderate (conscious) sedation was employed during this procedure. A total of Versed 2 mg and Fentanyl 100 mcg was administered intravenously. Moderate Sedation Time: 17 minutes. The patient's level of consciousness and vital signs were monitored continuously by radiology nursing throughout the procedure under my direct supervision. FLUOROSCOPY TIME:  Fluoroscopy Time: 1.3 minutes (9 mGy) COMPLICATIONS: None immediate. PROCEDURE: Informed written consent was obtained from the patient after a thorough discussion of the procedural risks, benefits and alternatives. All questions were addressed. Maximal Sterile Barrier Technique was utilized including caps, mask, sterile gowns, sterile gloves, sterile drape, hand hygiene and skin antiseptic. A timeout was performed prior to the initiation of the procedure. The patient was placed prone on the exam table. Limited fluoroscopy of the pelvis was performed for planning purposes. Skin entry site was marked, and the overlying skin was prepped and draped in the standard sterile fashion. Local analgesia was obtained with 1% lidocaine. Using fluoroscopic  guidance, an 11 gauge needle was advanced just deep to the cortex of the right posterior ilium. Subsequently, bone marrow aspiration was attempted, yielding a dry tap. Core biopsy was performed, demonstrating exceedingly sclerotic bone. View of recent cross-sectional imaging demonstrated that needle was likely within a sclerotic lesion given history of prostate cancer. Therefore, attention was turned to the left side to re-attempt a bone marrow aspiration and core biopsy. Skin entry site was again marked, and the overlying skin was prepped and draped in the standard sterile fashion. Local analgesia was obtained with 1% lidocaine. Using fluoroscopic guidance, an 11 gauge needle was advanced just deep to the cortex of the left posterior ilium. Subsequently, bone marrow  aspiration and core biopsy was performed. Specimens were submitted to lab/pathology for handling. Hemostasis was achieved with manual pressure, and a clean dressing was placed. The patient tolerated the procedure well without immediate complication. IMPRESSION: 1. Successful core needle biopsy of sclerotic bone lesion in the right posterior ilium using fluoroscopic guidance. 2. Successful bone marrow aspiration and core biopsy of the left posterior ilium using fluoroscopic guidance. Electronically Signed   By: Olive Bass M.D.   On: 01/24/2023 09:32   DG UGI W SINGLE CM (SOL OR THIN BA)  Result Date: 01/20/2023 CLINICAL DATA:  Esophageal dysphagia, concern for possible stricture, esophagitis, gastritis, dysmotility. EXAM: DG UGI W SINGLE CM TECHNIQUE: Single contrast examination was then performed using thin liquid barium. This exam was performed by Mina Marble, PA-C , and was supervised and interpreted by Dr. Elige Ko. FLUOROSCOPY: Radiation Exposure Index (as provided by the fluoroscopic device): 15.00 mGy Kerma COMPARISON:  None Available. FINDINGS: Esophagus:  Normal appearance. Esophageal motility: Mild dysmotility with tertiary contractions and stasis of contrast material visualized. Gastroesophageal reflux: Small volume spontaneous gastroesophageal reflux visualized, unable to provoke gastroesophageal reflux Ingested 13mm barium tablet: Passed normally Stomach: Normal appearance. No hiatal hernia. Gastric emptying: Normal. Duodenum:  Distal duodenal diverticulum visualized Other:  None. IMPRESSION: 1.  Mild esophageal dysmotility 2.  Small volume gastroesophageal reflux 3.  Distal duodenal diverticulum Electronically Signed   By: Elige Ko M.D.   On: 01/20/2023 23:30   DG Chest 2 View  Result Date: 01/18/2023 CLINICAL DATA:  Weakness and decreased appetite.  Hematemesis. EXAM: CHEST - 2 VIEW COMPARISON:  Chest radiograph dated 01/07/2023 FINDINGS: Normal lung volumes. Bilateral lower lung  interstitial opacities. No pleural effusion or pneumothorax. The heart size and mediastinal contours are within normal limits. No acute osseous abnormality. IMPRESSION: Bilateral lower lung interstitial opacities, which may represent atelectasis or infection. Electronically Signed   By: Agustin Cree M.D.   On: 01/18/2023 14:09   DG Abd 1 View  Result Date: 01/15/2023 CLINICAL DATA:  abd pain/constipation, r/o obstructive gas pattern EXAM: ABDOMEN - 1 VIEW COMPARISON:  CT AP 01/07/23 FINDINGS: Nonobstructive bowel gas pattern. Large colonic stool burden. Pelvic phleboliths. Rounded density projecting over the L4-L5 vertebral body levels is nonspecific, but favored to represent patient's known fat containing umbilical hernia. Assessment of the sacrum is slightly limited due to overlying bowel gas. No acute osseous abnormality. IMPRESSION: Nonobstructive bowel gas pattern. Large colonic stool burden. Electronically Signed   By: Lorenza Cambridge M.D.   On: 01/15/2023 13:02   CT Angio Chest PE W/Cm &/Or Wo Cm  Result Date: 01/07/2023 CLINICAL DATA:  pt reports since last night having left sided CP, nonradiating accomp by nausea; Abdominal pain, acute, nonlocalized History of metastatic prostate cancer. EXAM: CT ANGIOGRAPHY CHEST CT ABDOMEN AND PELVIS WITH  CONTRAST TECHNIQUE: Multidetector CT imaging of the chest was performed using the standard protocol during bolus administration of intravenous contrast. Multiplanar CT image reconstructions and MIPs were obtained to evaluate the vascular anatomy. Multidetector CT imaging of the abdomen and pelvis was performed using the standard protocol during bolus administration of intravenous contrast. RADIATION DOSE REDUCTION: This exam was performed according to the departmental dose-optimization program which includes automated exposure control, adjustment of the mA and/or kV according to patient size and/or use of iterative reconstruction technique. CONTRAST:  OMNIPAQUE  IOHEXOL 350 MG/ML SOLN COMPARISON:  Chest XR, 01/07/2023. CTA chest, 11/23/2022. CT AP, 03/26/2023. PET-CT, 12/18/2022. FINDINGS: CTA CHEST FINDINGS Cardiovascular: Satisfactory opacification of the pulmonary arteries to the segmental level. No segmental or larger pulmonary embolus. normal heart size. No pericardial effusion. Mediastinum/Nodes: Interval enlargement in mediastinal adenopathy, with representative prevascular and precarinal lymph nodes measuring approximately 2.5 x 1.7 cm and 2.5 x 3.8 cm (AP by transaxial) respectively. No enlarged axillary lymph nodes. Thyroid gland, trachea, and esophagus demonstrate no significant findings. Lungs/Pleura: Mild basilar atelectasis. Lungs are otherwise clear without focal consolidation, mass or suspicious pulmonary nodule. No pleural effusion or pneumothorax. Musculoskeletal: Mild gynecomastia. No acute chest wall abnormality. Multifocal sclerotic osseous lesions within the imaged thoracic spine. Review of the MIP images confirms the above findings. CT ABDOMEN and PELVIS FINDINGS Hepatobiliary: Normal volume of liver and contour. Increased number and conspicuity of small, multifocal rounded and hypodense lesions scattered within liver contracted gallbladder with dense gallstones. No gallbladder wall thickening, or biliary dilatation. Pancreas: Relatively similar size and appearance ~ 1 cm hypodense pancreatic cystic lesion at the uncinate. No pancreatic ductal dilatation or surrounding inflammatory changes. Spleen: Normal in size without focal abnormality. Adrenals/Urinary Tract: Adrenal glands are unremarkable. Kidneys are normal, without renal calculi, focal lesion, or hydronephrosis. Bladder is decompressed by Foley catheter. Stomach/Bowel: Stomach is within normal limits. Appendix is not definitively visualized. Nonobstructed small bowel. Nondilated colon. Severe burden of colonic diverticulosis, involving the descending and transverse colon. No evidence of bowel  wall thickening, distention, or acute inflammatory change. Vascular/Lymphatic: No significant vascular findings are present. No enlarged abdominal or pelvic lymph nodes. Reproductive: Irregular nodular contour of prostate. Other: No abdominopelvic ascites or intraperitoneal free air. Postsurgical changes of prior laparotomy. Fat-containing, umbilical ventral hernia. Musculoskeletal: No acute osseous finding teams or displaced fracture. Multifocal osseous sclerotic lesions within the imaged pelvis and axial spine. Review of the MIP images confirms the above findings. IMPRESSION: Since CT CAP dated 12/15/2022; 1. No segmental or larger pulmonary embolus. 2. No acute abdominopelvic process. 3. Progression of metastatic disease, with enlargement in mediastinal adenopathy within chest, and increased number and conspicuity of small multifocal lesions within liver, as described in detail above. Electronically Signed   By: Roanna Banning M.D.   On: 01/07/2023 09:29   CT ABDOMEN PELVIS W CONTRAST  Result Date: 01/07/2023 CLINICAL DATA:  pt reports since last night having left sided CP, nonradiating accomp by nausea; Abdominal pain, acute, nonlocalized History of metastatic prostate cancer. EXAM: CT ANGIOGRAPHY CHEST CT ABDOMEN AND PELVIS WITH CONTRAST TECHNIQUE: Multidetector CT imaging of the chest was performed using the standard protocol during bolus administration of intravenous contrast. Multiplanar CT image reconstructions and MIPs were obtained to evaluate the vascular anatomy. Multidetector CT imaging of the abdomen and pelvis was performed using the standard protocol during bolus administration of intravenous contrast. RADIATION DOSE REDUCTION: This exam was performed according to the departmental dose-optimization program which includes automated exposure control, adjustment of the  mA and/or kV according to patient size and/or use of iterative reconstruction technique. CONTRAST:  OMNIPAQUE IOHEXOL 350 MG/ML  SOLN COMPARISON:  Chest XR, 01/07/2023. CTA chest, 11/23/2022. CT AP, 03/26/2023. PET-CT, 12/18/2022. FINDINGS: CTA CHEST FINDINGS Cardiovascular: Satisfactory opacification of the pulmonary arteries to the segmental level. No segmental or larger pulmonary embolus. normal heart size. No pericardial effusion. Mediastinum/Nodes: Interval enlargement in mediastinal adenopathy, with representative prevascular and precarinal lymph nodes measuring approximately 2.5 x 1.7 cm and 2.5 x 3.8 cm (AP by transaxial) respectively. No enlarged axillary lymph nodes. Thyroid gland, trachea, and esophagus demonstrate no significant findings. Lungs/Pleura: Mild basilar atelectasis. Lungs are otherwise clear without focal consolidation, mass or suspicious pulmonary nodule. No pleural effusion or pneumothorax. Musculoskeletal: Mild gynecomastia. No acute chest wall abnormality. Multifocal sclerotic osseous lesions within the imaged thoracic spine. Review of the MIP images confirms the above findings. CT ABDOMEN and PELVIS FINDINGS Hepatobiliary: Normal volume of liver and contour. Increased number and conspicuity of small, multifocal rounded and hypodense lesions scattered within liver contracted gallbladder with dense gallstones. No gallbladder wall thickening, or biliary dilatation. Pancreas: Relatively similar size and appearance ~ 1 cm hypodense pancreatic cystic lesion at the uncinate. No pancreatic ductal dilatation or surrounding inflammatory changes. Spleen: Normal in size without focal abnormality. Adrenals/Urinary Tract: Adrenal glands are unremarkable. Kidneys are normal, without renal calculi, focal lesion, or hydronephrosis. Bladder is decompressed by Foley catheter. Stomach/Bowel: Stomach is within normal limits. Appendix is not definitively visualized. Nonobstructed small bowel. Nondilated colon. Severe burden of colonic diverticulosis, involving the descending and transverse colon. No evidence of bowel wall thickening,  distention, or acute inflammatory change. Vascular/Lymphatic: No significant vascular findings are present. No enlarged abdominal or pelvic lymph nodes. Reproductive: Irregular nodular contour of prostate. Other: No abdominopelvic ascites or intraperitoneal free air. Postsurgical changes of prior laparotomy. Fat-containing, umbilical ventral hernia. Musculoskeletal: No acute osseous finding teams or displaced fracture. Multifocal osseous sclerotic lesions within the imaged pelvis and axial spine. Review of the MIP images confirms the above findings. IMPRESSION: Since CT CAP dated 12/15/2022; 1. No segmental or larger pulmonary embolus. 2. No acute abdominopelvic process. 3. Progression of metastatic disease, with enlargement in mediastinal adenopathy within chest, and increased number and conspicuity of small multifocal lesions within liver, as described in detail above. Electronically Signed   By: Roanna Banning M.D.   On: 01/07/2023 09:29   DG Chest Portable 1 View  Result Date: 01/07/2023 CLINICAL DATA:  Chest pain EXAM: PORTABLE CHEST 1 VIEW COMPARISON:  11/23/2022 FINDINGS: Artifact from EKG leads. Rounded density over the left mid lung is attributed to the anterior fifth rib based on prior CT, there is known prostate cancer and bone metastases. There is no edema, consolidation, effusion, or pneumothorax. Normal heart size and mediastinal contours. IMPRESSION: No active disease. Electronically Signed   By: Tiburcio Pea M.D.   On: 01/07/2023 07:41    Microbiology: Results for orders placed or performed during the hospital encounter of 01/18/23  Resp Panel by RT-PCR (Flu A&B, Covid) Anterior Nasal Swab     Status: None   Collection Time: 01/18/23  2:16 PM   Specimen: Anterior Nasal Swab  Result Value Ref Range Status   SARS Coronavirus 2 by RT PCR NEGATIVE NEGATIVE Final    Comment: (NOTE) SARS-CoV-2 target nucleic acids are NOT DETECTED.  The SARS-CoV-2 RNA is generally detectable in upper  respiratory specimens during the acute phase of infection. The lowest concentration of SARS-CoV-2 viral copies this assay can detect is  138 copies/mL. A negative result does not preclude SARS-Cov-2 infection and should not be used as the sole basis for treatment or other patient management decisions. A negative result may occur with  improper specimen collection/handling, submission of specimen other than nasopharyngeal swab, presence of viral mutation(s) within the areas targeted by this assay, and inadequate number of viral copies(<138 copies/mL). A negative result must be combined with clinical observations, patient history, and epidemiological information. The expected result is Negative.  Fact Sheet for Patients:  BloggerCourse.com  Fact Sheet for Healthcare Providers:  SeriousBroker.it  This test is no t yet approved or cleared by the [place] FDA and  has been authorized for detection and/or diagnosis of SARS-CoV-2 by FDA under an Emergency Use Authorization (EUA). This EUA will remain  in effect (meaning this test can be used) for the duration of the COVID-19 declaration under Section 564(b)(1) of the Act, 21 U.S.C.section 360bbb-3(b)(1), unless the authorization is terminated  or revoked sooner.       Influenza A by PCR NEGATIVE NEGATIVE Final   Influenza B by PCR NEGATIVE NEGATIVE Final    Comment: (NOTE) The Xpert Xpress SARS-CoV-2/FLU/RSV plus assay is intended as an aid in the diagnosis of influenza from Nasopharyngeal swab specimens and should not be used as a sole basis for treatment. Nasal washings and aspirates are unacceptable for Xpert Xpress SARS-CoV-2/FLU/RSV testing.  Fact Sheet for Patients: BloggerCourse.com  Fact Sheet for Healthcare Providers: SeriousBroker.it  This test is not yet approved or cleared by the [place] FDA and has been  authorized for detection and/or diagnosis of SARS-CoV-2 by FDA under an Emergency Use Authorization (EUA). This EUA will remain in effect (meaning this test can be used) for the duration of the COVID-19 declaration under Section 564(b)(1) of the Act, 21 U.S.C. section 360bbb-3(b)(1), unless the authorization is terminated or revoked.  Performed at [facility], [address] [place], [place] [address]     Labs: CBC: Recent Labs  Lab 01/20/23 0327 01/21/23 0523 01/22/23 0455 01/23/23 0544 01/24/23 0538  WBC 10.6* 14.4* 19.2*  19.0* 18.4* 16.9*  NEUTROABS  --   --  14.8*  --   --   HGB 7.9* 8.7* 8.6*  8.7* 8.2* 7.6*  HCT 23.8* 26.2* 27.9*  26.1* 24.6* 22.1*  MCV 70.0* 71.2* 74.2*  71.7* 70.5* 70.4*  PLT 651* 673* 701*  681* 625* 586*   Basic Metabolic Panel: Recent Labs  Lab 01/20/23 0327 01/21/23 0523 01/22/23 0455 01/23/23 0544 01/24/23 0538  NA 126* 126* 129* 126* 125*  K 3.3* 3.8 3.2* 3.6 3.7  CL 86* 88* 90* 91* 89*  CO2 29 26 26 24 25   GLUCOSE 192* 189* 168* 191* 165*  BUN 7* 6* <5* 6* 8  CREATININE 0.68 0.61 0.62 0.71 0.76  CALCIUM 9.1 9.1 9.1 8.7* 8.4*  MG 1.8 1.9 1.9 1.7 1.9   Liver Function Tests: Recent Labs  Lab 01/18/23 0952  AST 17  ALT 8  ALKPHOS 125  BILITOT 1.1  PROT 8.3*  ALBUMIN 2.6*   CBG: No results for input(s): "GLUCAP" in the last 168 hours.  Discharge time spent: greater than 30 minutes.  This record has been created using Conservation officer, historic buildings. Errors have been sought and corrected,but may not always be located. Such creation errors do not reflect on the standard of care.   Signed: Arnetha Courser, MD [facility] 01/24/2023

## 2023-01-24 NOTE — Plan of Care (Signed)

## 2023-01-24 NOTE — Procedures (Signed)
Interventional Radiology Procedure Note  Date of Procedure: 01/24/2023  Procedure: IR BMBx  Findings:  1. BMBx right and left posterior ilium    Complications: No immediate complications noted.   Estimated Blood Loss: minimal  Follow-up and Recommendations: 1. Bedrest 2 hours    Olive Bass, MD  Vascular & Interventional Radiology  01/24/2023 9:07 AM

## 2023-01-27 ENCOUNTER — Telehealth: Payer: Self-pay

## 2023-01-27 LAB — BCR-ABL1 FISH
Cells Analyzed: 200
Cells Counted: 200

## 2023-01-28 LAB — SURGICAL PATHOLOGY

## 2023-01-29 ENCOUNTER — Inpatient Hospital Stay: Payer: Medicaid Other

## 2023-01-29 ENCOUNTER — Inpatient Hospital Stay: Payer: Medicaid Other | Admitting: Oncology

## 2023-01-29 VITALS — BP 106/72 | HR 95 | Temp 98.2°F | Resp 16

## 2023-01-29 DIAGNOSIS — D508 Other iron deficiency anemias: Secondary | ICD-10-CM

## 2023-01-29 DIAGNOSIS — C61 Malignant neoplasm of prostate: Secondary | ICD-10-CM

## 2023-01-29 LAB — COMP PANEL: LEUKEMIA/LYMPHOMA

## 2023-01-29 LAB — CBC WITH DIFFERENTIAL (CANCER CENTER ONLY)
Abs Immature Granulocytes: 0.62 10*3/uL — ABNORMAL HIGH (ref 0.00–0.07)
Basophils Absolute: 0.1 10*3/uL (ref 0.0–0.1)
Basophils Relative: 0 %
Eosinophils Absolute: 0.1 10*3/uL (ref 0.0–0.5)
Eosinophils Relative: 0 %
HCT: 24.8 % — ABNORMAL LOW (ref 39.0–52.0)
Hemoglobin: 8.1 g/dL — ABNORMAL LOW (ref 13.0–17.0)
Immature Granulocytes: 5 %
Lymphocytes Relative: 17 %
Lymphs Abs: 2.3 10*3/uL (ref 0.7–4.0)
MCH: 23.5 pg — ABNORMAL LOW (ref 26.0–34.0)
MCHC: 32.7 g/dL (ref 30.0–36.0)
MCV: 72.1 fL — ABNORMAL LOW (ref 80.0–100.0)
Monocytes Absolute: 1.1 10*3/uL — ABNORMAL HIGH (ref 0.1–1.0)
Monocytes Relative: 8 %
Neutro Abs: 9.5 10*3/uL — ABNORMAL HIGH (ref 1.7–7.7)
Neutrophils Relative %: 70 %
Platelet Count: 551 10*3/uL — ABNORMAL HIGH (ref 150–400)
RBC: 3.44 MIL/uL — ABNORMAL LOW (ref 4.22–5.81)
RDW: 17.2 % — ABNORMAL HIGH (ref 11.5–15.5)
WBC Count: 13.7 10*3/uL — ABNORMAL HIGH (ref 4.0–10.5)
nRBC: 0.2 % (ref 0.0–0.2)

## 2023-01-29 LAB — SAMPLE TO BLOOD BANK

## 2023-01-29 MED ORDER — SODIUM CHLORIDE 0.9 % IV SOLN
Freq: Once | INTRAVENOUS | Status: AC
  Filled 2023-01-29: qty 250

## 2023-01-29 MED ORDER — SODIUM CHLORIDE 0.9 % IV SOLN
200.0000 mg | Freq: Once | INTRAVENOUS | Status: AC
  Administered 2023-01-29: 200 mg via INTRAVENOUS
  Filled 2023-01-29: qty 200

## 2023-01-31 ENCOUNTER — Encounter (HOSPITAL_COMMUNITY): Payer: Self-pay

## 2023-02-03 ENCOUNTER — Other Ambulatory Visit: Payer: Self-pay

## 2023-02-03 ENCOUNTER — Emergency Department: Payer: Medicaid Other

## 2023-02-03 ENCOUNTER — Emergency Department
Admission: EM | Admit: 2023-02-03 | Discharge: 2023-02-03 | Disposition: A | Discharge status: Home / Self Care | Payer: Medicaid Other | Attending: Emergency Medicine | Admitting: Emergency Medicine

## 2023-02-03 DIAGNOSIS — I10 Essential (primary) hypertension: Secondary | ICD-10-CM | POA: Insufficient documentation

## 2023-02-03 DIAGNOSIS — D649 Anemia, unspecified: Secondary | ICD-10-CM | POA: Insufficient documentation

## 2023-02-03 DIAGNOSIS — R1310 Dysphagia, unspecified: Secondary | ICD-10-CM | POA: Insufficient documentation

## 2023-02-03 DIAGNOSIS — R110 Nausea: Secondary | ICD-10-CM

## 2023-02-03 DIAGNOSIS — R0789 Other chest pain: Secondary | ICD-10-CM | POA: Diagnosis present

## 2023-02-03 DIAGNOSIS — Z8546 Personal history of malignant neoplasm of prostate: Secondary | ICD-10-CM | POA: Insufficient documentation

## 2023-02-03 LAB — BASIC METABOLIC PANEL
Anion gap: 14 (ref 5–15)
BUN: 10 mg/dL (ref 8–23)
CO2: 26 mmol/L (ref 22–32)
Calcium: 8.6 mg/dL — ABNORMAL LOW (ref 8.9–10.3)
Chloride: 90 mmol/L — ABNORMAL LOW (ref 98–111)
Creatinine, Ser: 0.73 mg/dL (ref 0.61–1.24)
GFR, Estimated: >60 mL/min (ref >60)
Glucose, Bld: 157 mg/dL — ABNORMAL HIGH (ref 70–99)
Potassium: 3 mmol/L — ABNORMAL LOW (ref 3.5–5.1)
Sodium: 130 mmol/L — ABNORMAL LOW (ref 135–145)

## 2023-02-03 LAB — CBC
HCT: 26.4 % — ABNORMAL LOW (ref 39.0–52.0)
Hemoglobin: 8.4 g/dL — ABNORMAL LOW (ref 13.0–17.0)
MCH: 23.6 pg — ABNORMAL LOW (ref 26.0–34.0)
MCHC: 31.8 g/dL (ref 30.0–36.0)
MCV: 74.2 fL — ABNORMAL LOW (ref 80.0–100.0)
Platelets: 503 10*3/uL — ABNORMAL HIGH (ref 150–400)
RBC: 3.56 MIL/uL — ABNORMAL LOW (ref 4.22–5.81)
RDW: 18.6 % — ABNORMAL HIGH (ref 11.5–15.5)
WBC: 8.7 10*3/uL (ref 4.0–10.5)
nRBC: 0 % (ref 0.0–0.2)

## 2023-02-03 LAB — TROPONIN I (HIGH SENSITIVITY)
Troponin I (High Sensitivity): 6 ng/L (ref <18)
Troponin I (High Sensitivity): 6 ng/L (ref <18)

## 2023-02-03 MED ORDER — LACTATED RINGERS IV BOLUS
1000.0000 mL | Freq: Once | INTRAVENOUS | Status: AC
  Administered 2023-02-03: 1000 mL via INTRAVENOUS

## 2023-02-03 MED ORDER — SODIUM CHLORIDE 0.9 % IV BOLUS
1000.0000 mL | Freq: Once | INTRAVENOUS | Status: AC
  Administered 2023-02-03: 1000 mL via INTRAVENOUS

## 2023-02-03 MED ORDER — ONDANSETRON HCL 4 MG/2ML IJ SOLN
4.0000 mg | Freq: Once | INTRAMUSCULAR | Status: AC
  Administered 2023-02-03: 4 mg via INTRAVENOUS
  Filled 2023-02-03: qty 2

## 2023-02-03 NOTE — ED Triage Notes (Signed)
First RN Note: Pt to ED via [facility] from home with c/o CP x 30 mins. Per EMS pt states is the L side of his chest radiating to his back, dull, and 9/10.   324 ASA  Hx DM HTN and anemia  Per EMS pt has hx of cancer but does not know what kind of cancer and per EMS would not elaborate further.   89-101 HR CBG 206 100% RA

## 2023-02-03 NOTE — ED Notes (Signed)
See triage note  Presents with some chest discomfort  dry heaves and decreased appetite  Slight nausea  No fever

## 2023-02-03 NOTE — ED Provider Notes (Signed)
[facility] Provider Note    Event Date/Time   First MD Initiated Contact with Patient 02/03/23 [id number]     (approximate)  History   Chief Complaint: Chest Pain  HPI  Louis Gardner is a 62 y.o. male with a past medical history of hypertension, metastatic prostate cancer who presents to the emergency department for decreased oral intake and chest pain.  According to the patient over the last week or so he has not been able to keep much food down has been using Ensure protein shakes but states he feels like he could be dehydrated.  Patient states dry heaving at times but has not actually vomited.  Patient also states mild chest discomfort in the left chest which he states has been intermittent times months.  No pleuritic pain.  No shortness of breath.  Physical Exam   Triage Vital Signs: ED Triage Vitals  Encounter Vitals Group     BP 02/03/23 0704 116/65     Systolic BP Percentile --      Diastolic BP Percentile --      Pulse Rate 02/03/23 0704 91     Resp 02/03/23 0704 17     Temp 02/03/23 0704 97.8 F (36.6 C)     Temp Source 02/03/23 0704 Oral     SpO2 02/03/23 0704 100 %     Weight 02/03/23 0705 140 lb (63.5 kg)     Height 02/03/23 0705 5\' 9"  (1.753 m)     Head Circumference --      Peak Flow --      Pain Score 02/03/23 0708 7     Pain Loc --      Pain Education --      Exclude from Growth Chart --     Most recent vital signs: Vitals:   02/03/23 0704  BP: 116/65  Pulse: 91  Resp: 17  Temp: 97.8 F (36.6 C)  SpO2: 100%    General: Awake, no distress.  CV:  Good peripheral perfusion.  Regular rate and rhythm  Resp:  Normal effort.  Equal breath sounds bilaterally.  Abd:  No distention.  Soft, nontender.  No rebound or guarding.  ED Results / Procedures / Treatments   EKG  EKG viewed and interpreted by myself shows a sinus rhythm at 96 bpm with a narrow QRS, normal axis, normal intervals, no concerning ST changes.  RADIOLOGY  I  have reviewed and interpreted chest x-ray images.  No obvious consolidation on my evaluation.   MEDICATIONS ORDERED IN ED: Medications  ondansetron (ZOFRAN) injection 4 mg (has no administration in time range)  sodium chloride 0.9 % bolus 1,000 mL (has no administration in time range)     IMPRESSION / MDM / ASSESSMENT AND PLAN / ED COURSE  I reviewed the triage vital signs and the nursing notes.  Patient's presentation is most consistent with acute presentation with potential threat to life or bodily function.  Patient presents to the emergency department for inability to tolerate oral intake over the past 1 week.  Patient was admitted per record review discharge 9/20 for a similar admission.  We will check labs, we will IV hydrate treat nausea.  Given the patient is intermittent chest discomfort we will also obtain cardiac enzymes a chest x-ray and EKG.  Will continue to closely monitor while awaiting results.  Patient's lab work has resulted showing no significant finding.  Chemistry shows borderline anion gap elevation of 14.  I have added an  additional liter of fluid to hydrate with 2 L.  Patient CBC shows chronic anemia largely unchanged from baseline no other concerning findings.  Troponin is negative and unchanged after 2 hours.  Chest x-ray is clear.  Patient states he is feeling much better after fluids.  Once his second liter finishes we will discharge the patient have him follow-up with his doctor.  Patient has nausea medication at home.  FINAL CLINICAL IMPRESSION(S) / ED DIAGNOSES   Dysphagia Chest pain   Note:  This document was prepared using Dragon voice recognition software and may include unintentional dictation errors.   Minna Antis, MD 02/03/23 1106

## 2023-02-05 ENCOUNTER — Inpatient Hospital Stay: Payer: Medicaid Other

## 2023-02-05 ENCOUNTER — Telehealth: Payer: Self-pay | Admitting: *Deleted

## 2023-02-05 ENCOUNTER — Inpatient Hospital Stay: Payer: Medicaid Other | Admitting: Oncology

## 2023-02-05 NOTE — Telephone Encounter (Signed)
Documentation noted that sister Roselyn Bering be added to his contact information. Sister is concerned that patient is starting chemotherapy at [facility] on March 06, 2023 and wanted to be sure that Dr Cathie Hoops was aware.

## 2023-02-05 NOTE — Telephone Encounter (Signed)
Pt is scheduled for Pluvicto on 10/31. Dr. Cathie Hoops is aware because she sent the referral for this procedure. Pt will need to be seen sooner than 10/22. Called pt no answer. Left message for him to call back and r/s today's appt to another day this week or next week.

## 2023-02-05 NOTE — Telephone Encounter (Signed)
Louis Gardner called stating she was patient sister and patient has appointment for treatment today and she is bringing him.  She stated concern that she had to take him to [facility] to cancer doctor for treatment on the "31st".  Nurse instructed sister that she was not on patient list for consent to share medical information and I would need to speak with patient and get his consent prior to speaking with her.  Caregiver stated understanding.

## 2023-02-12 ENCOUNTER — Other Ambulatory Visit: Payer: Self-pay

## 2023-02-12 ENCOUNTER — Inpatient Hospital Stay: Payer: Medicaid Other

## 2023-02-12 ENCOUNTER — Inpatient Hospital Stay: Payer: Medicaid Other | Attending: Oncology

## 2023-02-12 ENCOUNTER — Inpatient Hospital Stay (HOSPITAL_BASED_OUTPATIENT_CLINIC_OR_DEPARTMENT_OTHER): Payer: Medicaid Other | Admitting: Oncology

## 2023-02-12 ENCOUNTER — Encounter: Payer: Self-pay | Admitting: Oncology

## 2023-02-12 VITALS — BP 102/68

## 2023-02-12 VITALS — BP 107/64 | HR 94 | Temp 97.9°F | Wt 124.6 lb

## 2023-02-12 DIAGNOSIS — Z7189 Other specified counseling: Secondary | ICD-10-CM | POA: Insufficient documentation

## 2023-02-12 DIAGNOSIS — C7951 Secondary malignant neoplasm of bone: Secondary | ICD-10-CM

## 2023-02-12 DIAGNOSIS — R110 Nausea: Secondary | ICD-10-CM

## 2023-02-12 DIAGNOSIS — C779 Secondary and unspecified malignant neoplasm of lymph node, unspecified: Secondary | ICD-10-CM | POA: Insufficient documentation

## 2023-02-12 DIAGNOSIS — R1310 Dysphagia, unspecified: Secondary | ICD-10-CM | POA: Diagnosis not present

## 2023-02-12 DIAGNOSIS — F17210 Nicotine dependence, cigarettes, uncomplicated: Secondary | ICD-10-CM | POA: Diagnosis not present

## 2023-02-12 DIAGNOSIS — D508 Other iron deficiency anemias: Secondary | ICD-10-CM

## 2023-02-12 DIAGNOSIS — N320 Bladder-neck obstruction: Secondary | ICD-10-CM | POA: Insufficient documentation

## 2023-02-12 DIAGNOSIS — G893 Neoplasm related pain (acute) (chronic): Secondary | ICD-10-CM | POA: Insufficient documentation

## 2023-02-12 DIAGNOSIS — E871 Hypo-osmolality and hyponatremia: Secondary | ICD-10-CM | POA: Diagnosis not present

## 2023-02-12 DIAGNOSIS — E43 Unspecified severe protein-calorie malnutrition: Secondary | ICD-10-CM | POA: Insufficient documentation

## 2023-02-12 DIAGNOSIS — E876 Hypokalemia: Secondary | ICD-10-CM | POA: Insufficient documentation

## 2023-02-12 DIAGNOSIS — M549 Dorsalgia, unspecified: Secondary | ICD-10-CM | POA: Insufficient documentation

## 2023-02-12 DIAGNOSIS — D649 Anemia, unspecified: Secondary | ICD-10-CM

## 2023-02-12 DIAGNOSIS — C61 Malignant neoplasm of prostate: Secondary | ICD-10-CM | POA: Insufficient documentation

## 2023-02-12 DIAGNOSIS — R1319 Other dysphagia: Secondary | ICD-10-CM

## 2023-02-12 LAB — CBC WITH DIFFERENTIAL (CANCER CENTER ONLY)
Abs Immature Granulocytes: 0.19 10*3/uL — ABNORMAL HIGH (ref 0.00–0.07)
Basophils Absolute: 0 10*3/uL (ref 0.0–0.1)
Basophils Relative: 0 %
Eosinophils Absolute: 0 10*3/uL (ref 0.0–0.5)
Eosinophils Relative: 0 %
HCT: 26 % — ABNORMAL LOW (ref 39.0–52.0)
Hemoglobin: 8.4 g/dL — ABNORMAL LOW (ref 13.0–17.0)
Immature Granulocytes: 2 %
Lymphocytes Relative: 32 %
Lymphs Abs: 3.1 10*3/uL (ref 0.7–4.0)
MCH: 23.5 pg — ABNORMAL LOW (ref 26.0–34.0)
MCHC: 32.3 g/dL (ref 30.0–36.0)
MCV: 72.8 fL — ABNORMAL LOW (ref 80.0–100.0)
Monocytes Absolute: 0.9 10*3/uL (ref 0.1–1.0)
Monocytes Relative: 9 %
Neutro Abs: 5.5 10*3/uL (ref 1.7–7.7)
Neutrophils Relative %: 57 %
Platelet Count: 551 10*3/uL — ABNORMAL HIGH (ref 150–400)
RBC: 3.57 MIL/uL — ABNORMAL LOW (ref 4.22–5.81)
RDW: 18.3 % — ABNORMAL HIGH (ref 11.5–15.5)
WBC Count: 9.7 10*3/uL (ref 4.0–10.5)
nRBC: 0.2 % (ref 0.0–0.2)

## 2023-02-12 LAB — RETIC PANEL
Immature Retic Fract: 33.9 % — ABNORMAL HIGH (ref 2.3–15.9)
RBC.: 3.59 MIL/uL — ABNORMAL LOW (ref 4.22–5.81)
Retic Count, Absolute: 87.6 10*3/uL (ref 19.0–186.0)
Retic Ct Pct: 2.4 % (ref 0.4–3.1)
Reticulocyte Hemoglobin: 22.1 pg — ABNORMAL LOW (ref >27.9)

## 2023-02-12 LAB — SAMPLE TO BLOOD BANK

## 2023-02-12 MED ORDER — PROCHLORPERAZINE MALEATE 10 MG PO TABS: 10.0000 mg | ORAL_TABLET | Freq: Four times a day (QID) | ORAL | 0 refills | Status: DC | PRN

## 2023-02-12 MED ORDER — SODIUM CHLORIDE 0.9 % IV SOLN
Freq: Once | INTRAVENOUS | Status: AC
  Filled 2023-02-12: qty 250

## 2023-02-12 MED ORDER — OXYCODONE HCL 5 MG PO TABS: 5.0000 mg | ORAL_TABLET | Freq: Four times a day (QID) | ORAL | 0 refills | Status: DC | PRN

## 2023-02-12 MED ORDER — MEGESTROL ACETATE 40 MG PO TABS: 80.0000 mg | ORAL_TABLET | Freq: Two times a day (BID) | ORAL | 0 refills | Status: DC

## 2023-02-12 MED ORDER — SODIUM CHLORIDE 0.9 % IV SOLN
200.0000 mg | Freq: Once | INTRAVENOUS | Status: AC
  Administered 2023-02-12: 200 mg via INTRAVENOUS
  Filled 2023-02-12: qty 200

## 2023-02-12 MED ORDER — ONDANSETRON 8 MG PO TBDP: 8.0000 mg | ORAL_TABLET | Freq: Three times a day (TID) | ORAL | 1 refills | Status: DC | PRN

## 2023-02-12 NOTE — Assessment & Plan Note (Addendum)
Refer to dietitian.  Recommend nutrition supplementation.

## 2023-02-12 NOTE — Assessment & Plan Note (Signed)
He was seen by GI during recent admission, was felt to be due to dysmotility.  Continue PPI.

## 2023-02-12 NOTE — Assessment & Plan Note (Signed)
Chronic Foley catheter  Recommend patient to follow-up with urology.

## 2023-02-12 NOTE — Patient Instructions (Signed)
Iron Sucrose Injection What is this medication? IRON SUCROSE (EYE ern SOO krose) treats low levels of iron (iron deficiency anemia) in people with kidney disease. Iron is a mineral that plays an important role in making red blood cells, which carry oxygen from your lungs to the rest of your body. This medicine may be used for other purposes; ask your health care provider or pharmacist if you have questions. COMMON BRAND NAME(S): Venofer What should I tell my care team before I take this medication? They need to know if you have any of these conditions: Anemia not caused by low iron levels Heart disease High levels of iron in the blood Kidney disease Liver disease An unusual or allergic reaction to iron, other medications, foods, dyes, or preservatives Pregnant or trying to get pregnant Breastfeeding How should I use this medication? This medication is for infusion into a vein. It is given in a hospital or clinic setting. Talk to your care team about the use of this medication in children. While this medication may be prescribed for children as young as 2 years for selected conditions, precautions do apply. Overdosage: If you think you have taken too much of this medicine contact a poison control center or emergency room at once. NOTE: This medicine is only for you. Do not share this medicine with others. What if I miss a dose? Keep appointments for follow-up doses. It is important not to miss your dose. Call your care team if you are unable to keep an appointment. What may interact with this medication? Do not take this medication with any of the following: Deferoxamine Dimercaprol Other iron products This medication may also interact with the following: Chloramphenicol Deferasirox This list may not describe all possible interactions. Give your health care provider a list of all the medicines, herbs, non-prescription drugs, or dietary supplements you use. Also tell them if you smoke,  drink alcohol, or use illegal drugs. Some items may interact with your medicine. What should I watch for while using this medication? Visit your care team regularly. Tell your care team if your symptoms do not start to get better or if they get worse. You may need blood work done while you are taking this medication. You may need to follow a special diet. Talk to your care team. Foods that contain iron include: whole grains/cereals, dried fruits, beans, or peas, leafy green vegetables, and organ meats (liver, kidney). What side effects may I notice from receiving this medication? Side effects that you should report to your care team as soon as possible: Allergic reactions--skin rash, itching, hives, swelling of the face, lips, tongue, or throat Low blood pressure--dizziness, feeling faint or lightheaded, blurry vision Shortness of breath Side effects that usually do not require medical attention (report to your care team if they continue or are bothersome): Flushing Headache Joint pain Muscle pain Nausea Pain, redness, or irritation at injection site This list may not describe all possible side effects. Call your doctor for medical advice about side effects. You may report side effects to FDA at 1-800-FDA-1088. Where should I keep my medication? This medication is given in a hospital or clinic. It will not be stored at home. NOTE: This sheet is a summary. It may not cover all possible information. If you have questions about this medicine, talk to your doctor, pharmacist, or health care provider.  2024 Elsevier/Gold Standard (2022-09-27 00:00:00)

## 2023-02-12 NOTE — Assessment & Plan Note (Addendum)
Previous oncology history, treatment records were reviewed. He has history of metastatic prostate cancer with lymph node and bone involvement.  Currently on androgen deprivation therapy with the last dose of Eligard on 10/28/2022 Status post first-line treatment with docetaxel every 3 weeks x 4 cycles, as well as Daralutamide.  He has now developed castration resistant disease. We had a lengthy discussion about neck step options, Pluvicto versus salvage chemotherapy with cabazitaxel. Patient is scheduled to start Pluvicto on 03/06/23 with Dr. Amil Amen Add Megace 80mg  BID bridging to treatment.

## 2023-02-12 NOTE — Assessment & Plan Note (Addendum)
Discussed with patient and family members. They understand that patient's condition is not curable.  Treatment is with palliative intent.  Refer to palliative care service

## 2023-02-12 NOTE — Assessment & Plan Note (Signed)
Recommend Zofran 8mg  ODT Q8h PRN, compazine 10mg  Q6h PRN  Rx sent to pharmacy.

## 2023-02-12 NOTE — Assessment & Plan Note (Signed)
Continue oxycodone 5mg  Q6h-8h PRN, refills sent to pharmacy

## 2023-02-12 NOTE — Assessment & Plan Note (Addendum)
Multifactorial, largely due to bone marrow prostate cancer involvement.  Decreased reticulocyte hemoglobin, got empiric IV Venofer.  Today his hemoglobin has remained stable comparing to the level at discharge.  Monitor closely.

## 2023-02-12 NOTE — Addendum Note (Signed)
Addended by: Rickard Patience on: 02/12/2023 10:14 PM   Modules accepted: Orders

## 2023-02-12 NOTE — Progress Notes (Signed)
Hematology/Oncology Consult Note [phone] [phone]    CHIEF COMPLAINTS/PURPOSE OF CONSULTATION:  metastatic prostate cancer  ASSESSMENT & PLAN:   Prostate cancer metastatic to bone [facility]) Previous oncology history, treatment records were reviewed. He has history of metastatic prostate cancer with lymph node and bone involvement.  Currently on androgen deprivation therapy with the last dose of Eligard on 10/28/2022 Status post first-line treatment with docetaxel every 3 weeks x 4 cycles, as well as Daralutamide.  He has now developed castration resistant disease. We had a lengthy discussion about neck step options, Pluvicto versus salvage chemotherapy with cabazitaxel. Patient is scheduled to start Pluvicto on 03/06/23 with Dr. Amil Amen Add Megace 80mg  BID bridging to treatment.    Goals of care, counseling/discussion Discussed with patient and family members. They understand that patient's condition is not curable.  Treatment is with palliative intent.  Refer to palliative care service  Normocytic anemia Multifactorial, largely due to bone marrow prostate cancer involvement.  Decreased reticulocyte hemoglobin, got empiric IV Venofer.  Today his hemoglobin has remained stable comparing to the level at discharge.  Monitor closely.   Protein-calorie malnutrition, severe (HCC) Refer to dietitian.  Recommend nutrition supplementation.   Nausea without vomiting Recommend Zofran 8mg  ODT Q8h PRN, compazine 10mg  Q6h PRN  Rx sent to pharmacy.   Neoplasm related pain Continue oxycodone 5mg  Q6h-8h PRN, refills sent to pharmacy  Dysphagia He was seen by GI during recent admission, was felt to be due to dysmotility.  Continue PPI.   Bladder outlet obstruction Chronic Foley catheter  Recommend patient to follow-up with urology.   Orders Placed This Encounter  Procedures   Retic Panel    Standing Status:   Future    Number of Occurrences:   1     Standing Expiration Date:   02/12/2024   CMP (Cancer Center only)    Standing Status:   Future    Standing Expiration Date:   02/12/2024   CBC with Differential (Cancer Center Only)    Standing Status:   Future    Standing Expiration Date:   02/12/2024   PSA    Standing Status:   Future    Standing Expiration Date:   02/12/2024   Follow-up 1 week All questions were answered. The patient knows to call the clinic with any problems, questions or concerns.  Rickard Patience, MD, PhD [facility] Hematology Oncology 02/12/2023    HISTORY OF PRESENTING ILLNESS:  Louis Gardner 62 y.o. male presents to establish care for metastatic prostate cancer I have reviewed his chart and materials related to his cancer extensively and collaborated history with the patient. Summary of oncologic history is as follows: Oncology History  Prostate cancer metastatic to bone [facility])  05/29/2021 Initial Diagnosis   Prostate cancer metastatic to bone (HCC)  03/19/21 - CT A/P with contrast performed to follow up on pancreatic mass, with near resolution of pancreatic uncinate process. Bulky multistation abdominopelvic lymphadenopathy identified (e.g. left para-aortic node measuring 2.1cm and right common iliac node measuring 1.4cm). No concerning osseous lesions identified. Enlarged, heterogeneously enhancing prostate gland. 04/17/21 - PSA 48.83 05/29/21 - retroperitoneal lymph node core needle biopsy, pathology consistent with metastatic prostate adenocarcinoma. 06/27/21 - PSMA-PET scan with "widely metastatic osseous disease as well as avid adenopathy extending from the pelvis to the left supraclavicular region consistent with metastatic prostate cancer. Noting that there is one single enlarged retroperitoneal node with low/minimal tracer avidity measuring up to 4 cm."  07/31/2022 PSA 406.39 08/27/21- 12/24/2021, 4 cycles. -  docetaxel chemotherapy 75mg /m2 mid April 2023- darolutamide per ARASENS trial in mid April  2023  01/14/2022 PSMA showed marked interval improvement, tracer avid osseous disease now with only 6 lesions that demonstrating mild residual focal avidity  01/29/2023 PSA nadir  0.5 08/21/2022 PSA 3.32 09/25/2022, he developed castration resistant disease -Diffuse sclerotic lesions throughout the skeleton some of which demonstrate Pylarify uptake, as described in the body of the report, concerning for Pylarify avid metastatic disease. Uptake of the Pylarify avid lesions is overall similar to that of the liver.  -New uptake in the right posterior prostate and right seminal vesicle, concerning for malignancy.   Patient was recommended to stop darolutamide.  He was offered Pluvicto versus salvage chemotherapy with cabazitaxel.  He was undecided. 10/28/2022 PSA 18.92.         11/23/2022 Tumor Marker   PSA 117.5   11/23/2022 Imaging   Patient presented emergency room for evaluation of chest pain for 2 days. CT chest angiogram PE protocol 1. No evidence for acute pulmonary embolus. 2. Diffusely abnormal bones with multifocal areas of abnormal increased sclerosis noted throughout the thoracic spine, sternum and ribs concerning for diffuse osseous metastasis. 3. Prominent mediastinal and hilar lymph nodes are identified. These are nonspecific and may be reactive. Nodal metastasis not excluded. 4. Focal area of bronchiectasis with adjacent peripheral tree-in-bud nodularity in the right upper lobe. This is favored to represent sequelae of prior infection or inflammation. 5. Bilateral adrenal gland hyperplasia. 6. Cholelithiasis. 7. Irregular subpleural nodule in the anterior left upper lobe measures 4 mm. 8.  Aortic Atherosclerosis   11/27/2022 Cancer Staging   Staging form: Prostate, AJCC 8th Edition - Pathologic: Stage IVB (pT2, pN1, cM1, PSA: 55) - Signed by Rickard Patience, MD on 11/27/2022 Stage prefix: Initial diagnosis Prostate specific antigen (PSA) range: 20 or greater   Patient was  referred to establish care with oncology locally.  He will like to transfer his oncology care to our cancer center.  Today patient was accompanied by his partner.  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  Altered and off left foot.  # Bladder outlet obstruction-patient has indwelling Foley catheter due to obstruction. No additional chest pain episodes.  He took a dose of oxycodone that was prescribed by ER. EKG, troponin were reassuring.  X-ray showed no pneumonia. Patient denies any shortness of breath, back pain.   MEDICAL HISTORY:  Past Medical History:  Diagnosis Date   Arthritis    Depression    GI bleeding    Hypertension    Pancreatic cancer (HCC)    Sleep apnea     SURGICAL HISTORY: Past Surgical History:  Procedure Laterality Date   COLONOSCOPY     COLONOSCOPY WITH PROPOFOL N/A 12/09/2022   Procedure: COLONOSCOPY WITH PROPOFOL;  Surgeon: Wyline Mood, MD;  Location: [facility] ENDOSCOPY;  Service: Gastroenterology;  Laterality: N/A;   ESOPHAGOGASTRODUODENOSCOPY (EGD) WITH PROPOFOL N/A 03/19/2016   Procedure: ESOPHAGOGASTRODUODENOSCOPY (EGD) WITH PROPOFOL;  Surgeon: Christena Deem, MD;  Location: [facility] ENDOSCOPY;  Service: Endoscopy;  Laterality: N/A;   IR BONE MARROW BIOPSY & ASPIRATION  01/24/2023   IR CT BONE TROCAR/NEEDLE BIOPSY DEEP  01/24/2023    SOCIAL HISTORY: Social History   Socioeconomic History   Marital status: Single    Spouse name: Not on file   Number of children: Not on file   Years of education: Not on file   Highest education level: Not on file  Occupational History   Not on file  Tobacco Use  Smoking status: Every Day    Current packs/day: 0.25    Average packs/day: 0.3 packs/day for 48.0 years (12.0 ttl pk-yrs)    Types: Cigarettes    Passive exposure: Current   Smokeless tobacco: Never  Vaping Use   Vaping status: Never Used  Substance and Sexual Activity   Alcohol use: Yes    Alcohol/week: 6.0 standard drinks of alcohol    Types: 6 Cans  of beer per week   Drug use: No   Sexual activity: Not on file  Other Topics Concern   Not on file  Social History Narrative   Not on file   Social Determinants of Health   Financial Resource Strain: Low Risk  (11/27/2022)   Overall Financial Resource Strain (CARDIA)    Difficulty of Paying Living Expenses: Not very hard  Food Insecurity: No Food Insecurity (01/18/2023)   Hunger Vital Sign    Worried About Running Out of Food in the Last Year: Never true    Ran Out of Food in the Last Year: Never true  Recent Concern: Food Insecurity - Food Insecurity Present (11/27/2022)   Hunger Vital Sign    Worried About Running Out of Food in the Last Year: Sometimes true    Ran Out of Food in the Last Year: Never true  Transportation Needs: No Transportation Needs (01/18/2023)   PRAPARE - Administrator, Civil Service (Medical): No    Lack of Transportation (Non-Medical): No  Physical Activity: Not on file  Stress: No Stress Concern Present (11/27/2022)   [employer] of Occupational Health - Occupational Stress Questionnaire    Feeling of Stress : Only a little  Social Connections: Not on file  Intimate Partner Violence: Not At Risk (01/18/2023)   Humiliation, Afraid, Rape, and Kick questionnaire    Fear of Current or Ex-Partner: No    Emotionally Abused: No    Physically Abused: No    Sexually Abused: No    FAMILY HISTORY: History reviewed. No pertinent family history.  ALLERGIES:  has No Known Allergies.  MEDICATIONS:  Current Outpatient Medications  Medication Sig Dispense Refill   amLODipine (NORVASC) 10 MG tablet Take 10 mg by mouth daily.     bisacodyl (DULCOLAX) 10 MG suppository Place 1 suppository (10 mg total) rectally daily as needed for up to 12 doses for moderate constipation. 12 suppository 0   feeding supplement (ENSURE ENLIVE / ENSURE PLUS) LIQD Take 237 mLs by mouth 3 (three) times daily between meals. 237 mL 12   JARDIANCE 10 MG TABS tablet Take 10  mg by mouth daily.     lactulose (CHRONULAC) 10 GM/15ML solution Take 15-30 mLs (10-20 g total) by mouth 2 (two) times daily as needed for moderate constipation or severe constipation. 237 mL 0   magnesium hydroxide (MILK OF MAGNESIA) 400 MG/5ML suspension Take 5 mLs by mouth daily as needed for mild constipation.     megestrol (MEGACE) 40 MG tablet Take 2 tablets (80 mg total) by mouth 2 (two) times daily. 120 tablet 0   metFORMIN (GLUCOPHAGE) 500 MG tablet Take 1,000 mg by mouth 2 (two) times daily with a meal.     pantoprazole (PROTONIX) 40 MG tablet Take 1 tablet (40 mg total) by mouth daily. 30 tablet 2   polyethylene glycol (MIRALAX / GLYCOLAX) 17 g packet Take 17 g by mouth daily.     potassium chloride SA (KLOR-CON M) 20 MEQ tablet TAKE 2 TABLETS BY MOUTH DAILY FOR POTASSIUM  pravastatin (PRAVACHOL) 20 MG tablet Take 20 mg by mouth daily.     prochlorperazine (COMPAZINE) 10 MG tablet Take 1 tablet (10 mg total) by mouth every 6 (six) hours as needed for nausea or vomiting. 90 tablet 0   senna-docusate (SENOKOT-S) 8.6-50 MG tablet Take 1 tablet by mouth daily as needed for up to 30 doses for mild constipation. 30 tablet 0   tamsulosin (FLOMAX) 0.4 MG CAPS capsule Take 1 capsule by mouth daily.     ondansetron (ZOFRAN-ODT) 8 MG disintegrating tablet Take 1 tablet (8 mg total) by mouth every 8 (eight) hours as needed for nausea. 60 tablet 1   oxyCODONE (ROXICODONE) 5 MG immediate release tablet Take 1 tablet (5 mg total) by mouth every 6 (six) hours as needed for breakthrough pain or severe pain. 90 tablet 0   No current facility-administered medications for this visit.    Review of Systems  Constitutional:  Negative for appetite change, chills, fatigue, fever and unexpected weight change.  HENT:   Negative for hearing loss and voice change.   Eyes:  Negative for eye problems and icterus.  Respiratory:  Negative for chest tightness, cough and shortness of breath.   Cardiovascular:   Negative for chest pain and leg swelling.  Gastrointestinal:  Negative for abdominal distention and abdominal pain.  Endocrine: Negative for hot flashes.  Genitourinary:  Negative for difficulty urinating, dysuria and frequency.        Foley catheter  Musculoskeletal:  Negative for arthralgias.  Skin:  Negative for itching and rash.  Neurological:  Negative for light-headedness and numbness.  Hematological:  Negative for adenopathy. Does not bruise/bleed easily.  Psychiatric/Behavioral:  Negative for confusion.      PHYSICAL EXAMINATION: ECOG PERFORMANCE STATUS: 1 - Symptomatic but completely ambulatory  Vitals:   02/12/23 0910  BP: 107/64  Pulse: 94  Temp: 97.9 F (36.6 C)   Filed Weights   02/12/23 0910  Weight: 124 lb 9.6 oz (56.5 kg)    Physical Exam   LABORATORY DATA:  I have reviewed the data as listed    Latest Ref Rng & Units 02/12/2023    8:45 AM 02/03/2023    7:22 AM 01/29/2023   11:22 AM  CBC  WBC 4.0 - 10.5 K/uL 9.7  8.7  13.7   Hemoglobin 13.0 - 17.0 g/dL 8.4  8.4  8.1   Hematocrit 39.0 - 52.0 % 26.0  26.4  24.8   Platelets 150 - 400 K/uL 551  503  551       Latest Ref Rng & Units 02/03/2023    8:47 AM 01/24/2023    5:38 AM 01/23/2023    5:44 AM  CMP  Glucose 70 - 99 mg/dL 161  096  045   BUN 8 - 23 mg/dL 10  8  6    Creatinine 0.61 - 1.24 mg/dL 4.09  8.11  9.14   Sodium 135 - 145 mmol/L 130  125  126   Potassium 3.5 - 5.1 mmol/L 3.0  3.7  3.6   Chloride 98 - 111 mmol/L 90  89  91   CO2 22 - 32 mmol/L 26  25  24    Calcium 8.9 - 10.3 mg/dL 8.6  8.4  8.7      RADIOGRAPHIC STUDIES: I have personally reviewed the radiological images as listed and agreed with the findings in the report. DG Chest 2 View  Result Date: 02/03/2023 CLINICAL DATA:  Chest pain. EXAM: CHEST - 2 VIEW COMPARISON:  January 18, 2023. FINDINGS: The heart size and mediastinal contours are within normal limits. Both lungs are clear. The visualized skeletal structures are  unremarkable. IMPRESSION: No active cardiopulmonary disease. Electronically Signed   By: Lupita Raider M.D.   On: 02/03/2023 08:24   IR BONE MARROW BIOPSY & ASPIRATION  Result Date: 01/24/2023 INDICATION: Acute anemia; history of prostate cancer with metastatic disease EXAM: 1. Bone lesion core needle biopsy of the right ilium using fluoroscopic guidance 2. Bone marrow aspiration and core biopsy of the left ilium using fluoroscopic guidance MEDICATIONS: None. ANESTHESIA/SEDATION: Moderate (conscious) sedation was employed during this procedure. A total of Versed 2 mg and Fentanyl 100 mcg was administered intravenously. Moderate Sedation Time: 17 minutes. The patient's level of consciousness and vital signs were monitored continuously by radiology nursing throughout the procedure under my direct supervision. FLUOROSCOPY TIME:  Fluoroscopy Time: 1.3 minutes (9 mGy) COMPLICATIONS: None immediate. PROCEDURE: Informed written consent was obtained from the patient after a thorough discussion of the procedural risks, benefits and alternatives. All questions were addressed. Maximal Sterile Barrier Technique was utilized including caps, mask, sterile gowns, sterile gloves, sterile drape, hand hygiene and skin antiseptic. A timeout was performed prior to the initiation of the procedure. The patient was placed prone on the exam table. Limited fluoroscopy of the pelvis was performed for planning purposes. Skin entry site was marked, and the overlying skin was prepped and draped in the standard sterile fashion. Local analgesia was obtained with 1% lidocaine. Using fluoroscopic guidance, an 11 gauge needle was advanced just deep to the cortex of the right posterior ilium. Subsequently, bone marrow aspiration was attempted, yielding a dry tap. Core biopsy was performed, demonstrating exceedingly sclerotic bone. View of recent cross-sectional imaging demonstrated that needle was likely within a sclerotic lesion given history  of prostate cancer. Therefore, attention was turned to the left side to re-attempt a bone marrow aspiration and core biopsy. Skin entry site was again marked, and the overlying skin was prepped and draped in the standard sterile fashion. Local analgesia was obtained with 1% lidocaine. Using fluoroscopic guidance, an 11 gauge needle was advanced just deep to the cortex of the left posterior ilium. Subsequently, bone marrow aspiration and core biopsy was performed. Specimens were submitted to lab/pathology for handling. Hemostasis was achieved with manual pressure, and a clean dressing was placed. The patient tolerated the procedure well without immediate complication. IMPRESSION: 1. Successful core needle biopsy of sclerotic bone lesion in the right posterior ilium using fluoroscopic guidance. 2. Successful bone marrow aspiration and core biopsy of the left posterior ilium using fluoroscopic guidance. Electronically Signed   By: Olive Bass M.D.   On: 01/24/2023 09:32   IR CT BONE TROCAR/NEEDLE BIOPSY DEEP  Result Date: 01/24/2023 INDICATION: Acute anemia; history of prostate cancer with metastatic disease EXAM: 1. Bone lesion core needle biopsy of the right ilium using fluoroscopic guidance 2. Bone marrow aspiration and core biopsy of the left ilium using fluoroscopic guidance MEDICATIONS: None. ANESTHESIA/SEDATION: Moderate (conscious) sedation was employed during this procedure. A total of Versed 2 mg and Fentanyl 100 mcg was administered intravenously. Moderate Sedation Time: 17 minutes. The patient's level of consciousness and vital signs were monitored continuously by radiology nursing throughout the procedure under my direct supervision. FLUOROSCOPY TIME:  Fluoroscopy Time: 1.3 minutes (9 mGy) COMPLICATIONS: None immediate. PROCEDURE: Informed written consent was obtained from the patient after a thorough discussion of the procedural risks, benefits and alternatives. All questions were addressed.  Maximal Sterile Barrier  Technique was utilized including caps, mask, sterile gowns, sterile gloves, sterile drape, hand hygiene and skin antiseptic. A timeout was performed prior to the initiation of the procedure. The patient was placed prone on the exam table. Limited fluoroscopy of the pelvis was performed for planning purposes. Skin entry site was marked, and the overlying skin was prepped and draped in the standard sterile fashion. Local analgesia was obtained with 1% lidocaine. Using fluoroscopic guidance, an 11 gauge needle was advanced just deep to the cortex of the right posterior ilium. Subsequently, bone marrow aspiration was attempted, yielding a dry tap. Core biopsy was performed, demonstrating exceedingly sclerotic bone. View of recent cross-sectional imaging demonstrated that needle was likely within a sclerotic lesion given history of prostate cancer. Therefore, attention was turned to the left side to re-attempt a bone marrow aspiration and core biopsy. Skin entry site was again marked, and the overlying skin was prepped and draped in the standard sterile fashion. Local analgesia was obtained with 1% lidocaine. Using fluoroscopic guidance, an 11 gauge needle was advanced just deep to the cortex of the left posterior ilium. Subsequently, bone marrow aspiration and core biopsy was performed. Specimens were submitted to lab/pathology for handling. Hemostasis was achieved with manual pressure, and a clean dressing was placed. The patient tolerated the procedure well without immediate complication. IMPRESSION: 1. Successful core needle biopsy of sclerotic bone lesion in the right posterior ilium using fluoroscopic guidance. 2. Successful bone marrow aspiration and core biopsy of the left posterior ilium using fluoroscopic guidance. Electronically Signed   By: Olive Bass M.D.   On: 01/24/2023 09:32   DG UGI W SINGLE CM (SOL OR THIN BA)  Result Date: 01/20/2023 CLINICAL DATA:  Esophageal dysphagia,  concern for possible stricture, esophagitis, gastritis, dysmotility. EXAM: DG UGI W SINGLE CM TECHNIQUE: Single contrast examination was then performed using thin liquid barium. This exam was performed by Mina Marble, PA-C , and was supervised and interpreted by Dr. Elige Ko. FLUOROSCOPY: Radiation Exposure Index (as provided by the fluoroscopic device): 15.00 mGy Kerma COMPARISON:  None Available. FINDINGS: Esophagus:  Normal appearance. Esophageal motility: Mild dysmotility with tertiary contractions and stasis of contrast material visualized. Gastroesophageal reflux: Small volume spontaneous gastroesophageal reflux visualized, unable to provoke gastroesophageal reflux Ingested 13mm barium tablet: Passed normally Stomach: Normal appearance. No hiatal hernia. Gastric emptying: Normal. Duodenum:  Distal duodenal diverticulum visualized Other:  None. IMPRESSION: 1.  Mild esophageal dysmotility 2.  Small volume gastroesophageal reflux 3.  Distal duodenal diverticulum Electronically Signed   By: Elige Ko M.D.   On: 01/20/2023 23:30   DG Chest 2 View  Result Date: 01/18/2023 CLINICAL DATA:  Weakness and decreased appetite.  Hematemesis. EXAM: CHEST - 2 VIEW COMPARISON:  Chest radiograph dated 01/07/2023 FINDINGS: Normal lung volumes. Bilateral lower lung interstitial opacities. No pleural effusion or pneumothorax. The heart size and mediastinal contours are within normal limits. No acute osseous abnormality. IMPRESSION: Bilateral lower lung interstitial opacities, which may represent atelectasis or infection. Electronically Signed   By: Agustin Cree M.D.   On: 01/18/2023 14:09   DG Abd 1 View  Result Date: 01/15/2023 CLINICAL DATA:  abd pain/constipation, r/o obstructive gas pattern EXAM: ABDOMEN - 1 VIEW COMPARISON:  CT AP 01/07/23 FINDINGS: Nonobstructive bowel gas pattern. Large colonic stool burden. Pelvic phleboliths. Rounded density projecting over the L4-L5 vertebral body levels is nonspecific, but  favored to represent patient's known fat containing umbilical hernia. Assessment of the sacrum is slightly limited due to overlying bowel gas. No  acute osseous abnormality. IMPRESSION: Nonobstructive bowel gas pattern. Large colonic stool burden. Electronically Signed   By: Lorenza Cambridge M.D.   On: 01/15/2023 13:02

## 2023-02-13 ENCOUNTER — Encounter: Payer: Self-pay | Admitting: Gastroenterology

## 2023-02-13 ENCOUNTER — Telehealth: Payer: Self-pay

## 2023-02-13 ENCOUNTER — Inpatient Hospital Stay: Payer: Medicaid Other

## 2023-02-13 ENCOUNTER — Inpatient Hospital Stay: Payer: Medicaid Other | Admitting: Hospice and Palliative Medicine

## 2023-02-13 NOTE — Telephone Encounter (Signed)
PA for Oxycodone 5mg  has been sumbitted via covermymeds. Insurance response pending  Key: [id number] PA Case ID: [id number]

## 2023-02-13 NOTE — Progress Notes (Signed)
Nutrition  Patient did not show up for scheduled nutrition appointment today. Message sent to scheduling to offer another appointment.  Referring provider informed.   Yanelli Zapanta B. Freida Busman, RD, LDN Registered Dietitian [phone]

## 2023-02-13 NOTE — Telephone Encounter (Signed)
Medication approved from 02/13/23 to 02/13/24. notification Fax sent to pharmacy.

## 2023-02-14 ENCOUNTER — Encounter: Payer: Medicaid Other | Admitting: Hospice and Palliative Medicine

## 2023-02-17 ENCOUNTER — Other Ambulatory Visit: Payer: Self-pay

## 2023-02-17 DIAGNOSIS — C61 Malignant neoplasm of prostate: Secondary | ICD-10-CM

## 2023-02-18 ENCOUNTER — Other Ambulatory Visit: Payer: Medicaid Other

## 2023-02-18 ENCOUNTER — Inpatient Hospital Stay: Payer: Medicaid Other | Admitting: Oncology

## 2023-02-18 ENCOUNTER — Inpatient Hospital Stay: Payer: Medicaid Other

## 2023-02-18 ENCOUNTER — Ambulatory Visit: Payer: Medicaid Other

## 2023-02-18 ENCOUNTER — Inpatient Hospital Stay: Payer: Medicaid Other | Admitting: Hospice and Palliative Medicine

## 2023-02-18 NOTE — Assessment & Plan Note (Deleted)
Previous oncology history, treatment records were reviewed. He has history of metastatic prostate cancer with lymph node and bone involvement.  Currently on androgen deprivation therapy with the last dose of Eligard on 10/28/2022 Status post first-line treatment with docetaxel every 3 weeks x 4 cycles, as well as Daralutamide.  He has now developed castration resistant disease. We had a lengthy discussion about neck step options, Pluvicto versus salvage chemotherapy with cabazitaxel. Patient is scheduled to start Pluvicto on 03/06/23 with Dr. Amil Amen Add Megace 80mg  BID bridging to treatment.

## 2023-02-20 ENCOUNTER — Telehealth: Payer: Self-pay

## 2023-02-20 ENCOUNTER — Ambulatory Visit: Admission: RE | Admit: 2023-02-20 | Payer: Medicaid Other | Source: Home / Self Care | Admitting: Gastroenterology

## 2023-02-20 ENCOUNTER — Inpatient Hospital Stay: Payer: Medicaid Other

## 2023-02-20 SURGERY — COLONOSCOPY WITH PROPOFOL
Anesthesia: General

## 2023-02-20 NOTE — Telephone Encounter (Signed)
PT  requesting call back to reschedule colonoscopy had problem keeping prep doe

## 2023-02-21 ENCOUNTER — Inpatient Hospital Stay: Payer: Medicaid Other

## 2023-02-21 ENCOUNTER — Encounter: Payer: Self-pay | Admitting: Oncology

## 2023-02-21 ENCOUNTER — Inpatient Hospital Stay (HOSPITAL_BASED_OUTPATIENT_CLINIC_OR_DEPARTMENT_OTHER): Payer: Medicaid Other | Admitting: Hospice and Palliative Medicine

## 2023-02-21 ENCOUNTER — Inpatient Hospital Stay (HOSPITAL_BASED_OUTPATIENT_CLINIC_OR_DEPARTMENT_OTHER): Payer: Medicaid Other | Admitting: Oncology

## 2023-02-21 VITALS — BP 108/71 | HR 94 | Temp 98.6°F | Resp 19 | Wt 120.3 lb

## 2023-02-21 DIAGNOSIS — R1319 Other dysphagia: Secondary | ICD-10-CM

## 2023-02-21 DIAGNOSIS — C61 Malignant neoplasm of prostate: Secondary | ICD-10-CM

## 2023-02-21 DIAGNOSIS — E876 Hypokalemia: Secondary | ICD-10-CM | POA: Insufficient documentation

## 2023-02-21 DIAGNOSIS — Z515 Encounter for palliative care: Secondary | ICD-10-CM | POA: Diagnosis not present

## 2023-02-21 DIAGNOSIS — C7951 Secondary malignant neoplasm of bone: Secondary | ICD-10-CM

## 2023-02-21 DIAGNOSIS — G893 Neoplasm related pain (acute) (chronic): Secondary | ICD-10-CM

## 2023-02-21 DIAGNOSIS — E43 Unspecified severe protein-calorie malnutrition: Secondary | ICD-10-CM | POA: Diagnosis not present

## 2023-02-21 DIAGNOSIS — D649 Anemia, unspecified: Secondary | ICD-10-CM | POA: Diagnosis not present

## 2023-02-21 DIAGNOSIS — E871 Hypo-osmolality and hyponatremia: Secondary | ICD-10-CM

## 2023-02-21 DIAGNOSIS — R110 Nausea: Secondary | ICD-10-CM

## 2023-02-21 LAB — CMP (CANCER CENTER ONLY)
ALT: 11 U/L (ref 0–44)
AST: 35 U/L (ref 15–41)
Albumin: 3.2 g/dL — ABNORMAL LOW (ref 3.5–5.0)
Alkaline Phosphatase: 192 U/L — ABNORMAL HIGH (ref 38–126)
Anion gap: 14 (ref 5–15)
BUN: 11 mg/dL (ref 8–23)
CO2: 27 mmol/L (ref 22–32)
Calcium: 9.1 mg/dL (ref 8.9–10.3)
Chloride: 84 mmol/L — ABNORMAL LOW (ref 98–111)
Creatinine: 0.93 mg/dL (ref 0.61–1.24)
GFR, Estimated: >60 mL/min (ref >60)
Glucose, Bld: 187 mg/dL — ABNORMAL HIGH (ref 70–99)
Potassium: 2.8 mmol/L — ABNORMAL LOW (ref 3.5–5.1)
Sodium: 125 mmol/L — ABNORMAL LOW (ref 135–145)
Total Bilirubin: 0.5 mg/dL (ref 0.3–1.2)
Total Protein: 8.8 g/dL — ABNORMAL HIGH (ref 6.5–8.1)

## 2023-02-21 LAB — PSA: Prostatic Specific Antigen: 514.81 ng/mL — ABNORMAL HIGH (ref 0.00–4.00)

## 2023-02-21 LAB — CBC WITH DIFFERENTIAL (CANCER CENTER ONLY)
Abs Immature Granulocytes: 0.29 10*3/uL — ABNORMAL HIGH (ref 0.00–0.07)
Basophils Absolute: 0 10*3/uL (ref 0.0–0.1)
Basophils Relative: 0 %
Eosinophils Absolute: 0 10*3/uL (ref 0.0–0.5)
Eosinophils Relative: 0 %
HCT: 28.3 % — ABNORMAL LOW (ref 39.0–52.0)
Hemoglobin: 8.9 g/dL — ABNORMAL LOW (ref 13.0–17.0)
Immature Granulocytes: 2 %
Lymphocytes Relative: 21 %
Lymphs Abs: 2.6 10*3/uL (ref 0.7–4.0)
MCH: 23.5 pg — ABNORMAL LOW (ref 26.0–34.0)
MCHC: 31.4 g/dL (ref 30.0–36.0)
MCV: 74.7 fL — ABNORMAL LOW (ref 80.0–100.0)
Monocytes Absolute: 0.8 10*3/uL (ref 0.1–1.0)
Monocytes Relative: 6 %
Neutro Abs: 9 10*3/uL — ABNORMAL HIGH (ref 1.7–7.7)
Neutrophils Relative %: 71 %
Platelet Count: 578 10*3/uL — ABNORMAL HIGH (ref 150–400)
RBC: 3.79 MIL/uL — ABNORMAL LOW (ref 4.22–5.81)
RDW: 19.6 % — ABNORMAL HIGH (ref 11.5–15.5)
WBC Count: 12.8 10*3/uL — ABNORMAL HIGH (ref 4.0–10.5)
nRBC: 0 % (ref 0.0–0.2)

## 2023-02-21 LAB — SAMPLE TO BLOOD BANK

## 2023-02-21 MED ORDER — SODIUM CHLORIDE 0.9 % IV SOLN
Freq: Once | INTRAVENOUS | Status: AC
  Filled 2023-02-21: qty 250

## 2023-02-21 MED ORDER — POTASSIUM CHLORIDE 10 MEQ/100ML IV SOLN
10.0000 meq | Freq: Once | INTRAVENOUS | Status: AC
  Administered 2023-02-21: 10 meq via INTRAVENOUS
  Filled 2023-02-21: qty 100

## 2023-02-21 MED ORDER — POTASSIUM CHLORIDE CRYS ER 20 MEQ PO TBCR: 40.0000 meq | EXTENDED_RELEASE_TABLET | Freq: Two times a day (BID) | ORAL | 0 refills | Status: DC

## 2023-02-21 NOTE — Assessment & Plan Note (Signed)
Acute on chronic, dated back to 2021.  Hydration session.

## 2023-02-21 NOTE — Progress Notes (Signed)
Palliative Medicine [facility] at [facility] [phone] [phone]   Name: Louis Gardner Date: 02/21/2023 MRN: [id number]  DOB: August 02, 1960  Patient Care Team: [facility] as PCP - General Debbe Odea, MD as PCP - Cardiology (Cardiology) Rickard Patience, MD as Consulting Physician (Oncology)    REASON FOR CONSULTATION: Louis Gardner is a 62 y.o. 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  Patient was referred palliative care to address goals and manage ongoing symptoms.  SOCIAL HISTORY:     reports that he has been smoking cigarettes. He has a 12 pack-year smoking history. He has been exposed to tobacco smoke. He has never used smokeless tobacco. He reports current alcohol use of about 6.0 standard drinks of alcohol per week. He reports that he does not use drugs.  Patient is unmarried.  He has no children.  He lives at home with his girlfriend.  He previously worked in [employer].  ADVANCE DIRECTIVES:  Does not have  CODE STATUS:   PAST MEDICAL HISTORY: Past Medical History:  Diagnosis Date   Arthritis    Depression    GI bleeding    Hypertension    Pancreatic cancer (HCC)    Sleep apnea     PAST SURGICAL HISTORY:  Past Surgical History:  Procedure Laterality Date   COLONOSCOPY     COLONOSCOPY WITH PROPOFOL N/A 12/09/2022   Procedure: COLONOSCOPY WITH PROPOFOL;  Surgeon: Wyline Mood, MD;  Location: [facility] ENDOSCOPY;  Service: Gastroenterology;  Laterality: N/A;   ESOPHAGOGASTRODUODENOSCOPY (EGD) WITH PROPOFOL N/A 03/19/2016   Procedure: ESOPHAGOGASTRODUODENOSCOPY (EGD) WITH PROPOFOL;  Surgeon: Christena Deem, MD;  Location: [facility] ENDOSCOPY;  Service: Endoscopy;  Laterality: N/A;   IR BONE MARROW BIOPSY & ASPIRATION  01/24/2023   IR CT BONE TROCAR/NEEDLE BIOPSY DEEP  01/24/2023    HEMATOLOGY/ONCOLOGY HISTORY:  Oncology History   Prostate cancer metastatic to bone (HCC)  05/29/2021 Initial Diagnosis   Prostate cancer metastatic to bone (HCC)  03/19/21 - CT A/P with contrast performed to follow up on pancreatic mass, with near resolution of pancreatic uncinate process. Bulky multistation abdominopelvic lymphadenopathy identified (e.g. left para-aortic node measuring 2.1cm and right common iliac node measuring 1.4cm). No concerning osseous lesions identified. Enlarged, heterogeneously enhancing prostate gland. 04/17/21 - PSA 48.83 05/29/21 - retroperitoneal lymph node core needle biopsy, pathology consistent with metastatic prostate adenocarcinoma. 06/27/21 - PSMA-PET scan with "widely metastatic osseous disease as well as avid adenopathy extending from the pelvis to the left supraclavicular region consistent with metastatic prostate cancer. Noting that there is one single enlarged retroperitoneal node with low/minimal tracer avidity measuring up to 4 cm."  07/31/2022 PSA 406.39 08/27/21- 12/24/2021, 4 cycles. -docetaxel chemotherapy 75mg /m2 mid April 2023- darolutamide per ARASENS trial in mid April 2023  01/14/2022 PSMA showed marked interval improvement, tracer avid osseous disease now with only 6 lesions that demonstrating mild residual focal avidity  01/29/2023 PSA nadir  0.5 08/21/2022 PSA 3.32 09/25/2022, he developed castration resistant disease -Diffuse sclerotic lesions throughout the skeleton some of which demonstrate Pylarify uptake, as described in the body of the report, concerning for Pylarify avid metastatic disease. Uptake of the Pylarify avid lesions is overall similar to that of the liver.  -New uptake in the right posterior prostate and right seminal vesicle, concerning for malignancy.   Patient was recommended to stop darolutamide.  He was offered Pluvicto versus salvage chemotherapy with cabazitaxel.  He was undecided.  10/28/2022 PSA 18.92.         11/23/2022 Tumor Marker   PSA 117.5   11/23/2022  Imaging   Patient presented emergency room for evaluation of chest pain for 2 days. CT chest angiogram PE protocol 1. No evidence for acute pulmonary embolus. 2. Diffusely abnormal bones with multifocal areas of abnormal increased sclerosis noted throughout the thoracic spine, sternum and ribs concerning for diffuse osseous metastasis. 3. Prominent mediastinal and hilar lymph nodes are identified. These are nonspecific and may be reactive. Nodal metastasis not excluded. 4. Focal area of bronchiectasis with adjacent peripheral tree-in-bud nodularity in the right upper lobe. This is favored to represent sequelae of prior infection or inflammation. 5. Bilateral adrenal gland hyperplasia. 6. Cholelithiasis. 7. Irregular subpleural nodule in the anterior left upper lobe measures 4 mm. 8.  Aortic Atherosclerosis   11/27/2022 Cancer Staging   Staging form: Prostate, AJCC 8th Edition - Pathologic: Stage IVB (pT2, pN1, cM1, PSA: 48) - Signed by Rickard Patience, MD on 11/27/2022 Stage prefix: Initial diagnosis Prostate specific antigen (PSA) range: 20 or greater     ALLERGIES:  has No Known Allergies.  MEDICATIONS:  Current Outpatient Medications  Medication Sig Dispense Refill   amLODipine (NORVASC) 10 MG tablet Take 10 mg by mouth daily.     bisacodyl (DULCOLAX) 10 MG suppository Place 1 suppository (10 mg total) rectally daily as needed for up to 12 doses for moderate constipation. 12 suppository 0   feeding supplement (ENSURE ENLIVE / ENSURE PLUS) LIQD Take 237 mLs by mouth 3 (three) times daily between meals. 237 mL 12   JARDIANCE 10 MG TABS tablet Take 10 mg by mouth daily.     lactulose (CHRONULAC) 10 GM/15ML solution Take 15-30 mLs (10-20 g total) by mouth 2 (two) times daily as needed for moderate constipation or severe constipation. 237 mL 0   magnesium hydroxide (MILK OF MAGNESIA) 400 MG/5ML suspension Take 5 mLs by mouth daily as needed for mild constipation.     megestrol (MEGACE) 40 MG  tablet Take 2 tablets (80 mg total) by mouth 2 (two) times daily. 120 tablet 0   metFORMIN (GLUCOPHAGE) 500 MG tablet Take 1,000 mg by mouth 2 (two) times daily with a meal.     ondansetron (ZOFRAN-ODT) 8 MG disintegrating tablet Take 1 tablet (8 mg total) by mouth every 8 (eight) hours as needed for nausea. 60 tablet 1   oxyCODONE (ROXICODONE) 5 MG immediate release tablet Take 1 tablet (5 mg total) by mouth every 6 (six) hours as needed for breakthrough pain or severe pain. 90 tablet 0   pantoprazole (PROTONIX) 40 MG tablet Take 1 tablet (40 mg total) by mouth daily. 30 tablet 2   polyethylene glycol (MIRALAX / GLYCOLAX) 17 g packet Take 17 g by mouth daily.     potassium chloride SA (KLOR-CON M) 20 MEQ tablet TAKE 2 TABLETS BY MOUTH DAILY FOR POTASSIUM     pravastatin (PRAVACHOL) 20 MG tablet Take 20 mg by mouth daily.     prochlorperazine (COMPAZINE) 10 MG tablet Take 1 tablet (10 mg total) by mouth every 6 (six) hours as needed for nausea or vomiting. 90 tablet 0   senna-docusate (SENOKOT-S) 8.6-50 MG tablet Take 1 tablet by mouth daily as needed for up to 30 doses for mild constipation. 30 tablet 0   tamsulosin (FLOMAX) 0.4 MG CAPS capsule Take 1 capsule by mouth daily.     No current facility-administered medications for this visit.    VITAL SIGNS:  There were no vitals taken for this visit. There were no vitals filed for this visit.  Estimated body mass index is 17.77 kg/m as calculated from the following:   Height as of 02/03/23: 5\' 9"  (1.753 m).   Weight as of an earlier encounter on 02/21/23: 120 lb 4.8 oz (54.6 kg).  LABS: CBC:    Component Value Date/Time   WBC 12.8 (H) 02/21/2023 1007   WBC 8.7 02/03/2023 0722   HGB 8.9 (L) 02/21/2023 1007   HGB 11.4 (L) 09/08/2013 1436   HCT 28.3 (L) 02/21/2023 1007   HCT 36.3 (L) 09/08/2013 1436   PLT 578 (H) 02/21/2023 1007   PLT 315 09/08/2013 1436   MCV 74.7 (L) 02/21/2023 1007   MCV 83 09/08/2013 1436   NEUTROABS 9.0 (H)  02/21/2023 1007   NEUTROABS 1.8 08/05/2013 0616   LYMPHSABS 2.6 02/21/2023 1007   LYMPHSABS 1.6 08/05/2013 0616   MONOABS 0.8 02/21/2023 1007   MONOABS 0.8 08/05/2013 0616   EOSABS 0.0 02/21/2023 1007   EOSABS 0.1 08/05/2013 0616   BASOSABS 0.0 02/21/2023 1007   BASOSABS 1 09/08/2013 1436   BASOSABS 0.0 08/05/2013 0616   Comprehensive Metabolic Panel:    Component Value Date/Time   NA 125 (L) 02/21/2023 1007   NA 138 08/04/2013 0842   K 2.8 (L) 02/21/2023 1007   K 3.9 08/04/2013 0842   CL 84 (L) 02/21/2023 1007   CL 106 08/04/2013 0842   CO2 27 02/21/2023 1007   CO2 28 08/04/2013 0842   BUN 11 02/21/2023 1007   BUN 5 (L) 08/04/2013 0842   CREATININE 0.93 02/21/2023 1007   CREATININE 1.32 (H) 08/04/2013 0842   GLUCOSE 187 (H) 02/21/2023 1007   GLUCOSE 116 (H) 08/04/2013 0842   CALCIUM 9.1 02/21/2023 1007   CALCIUM 8.4 (L) 08/04/2013 0842   AST 35 02/21/2023 1007   ALT 11 02/21/2023 1007   ALT 58 08/02/2013 1135   ALKPHOS 192 (H) 02/21/2023 1007   ALKPHOS 62 08/02/2013 1135   BILITOT 0.5 02/21/2023 1007   PROT 8.8 (H) 02/21/2023 1007   PROT 8.8 (H) 08/02/2013 1135   ALBUMIN 3.2 (L) 02/21/2023 1007   ALBUMIN 4.2 08/02/2013 1135    RADIOGRAPHIC STUDIES: DG Chest 2 View  Result Date: 02/03/2023 CLINICAL DATA:  Chest pain. EXAM: CHEST - 2 VIEW COMPARISON:  January 18, 2023. FINDINGS: The heart size and mediastinal contours are within normal limits. Both lungs are clear. The visualized skeletal structures are unremarkable. IMPRESSION: No active cardiopulmonary disease. Electronically Signed   By: Lupita Raider M.D.   On: 02/03/2023 08:24   IR BONE MARROW BIOPSY & ASPIRATION  Result Date: 01/24/2023 INDICATION: Acute anemia; history of prostate cancer with metastatic disease EXAM: 1. Bone lesion core needle biopsy of the right ilium using fluoroscopic guidance 2. Bone marrow aspiration and core biopsy of the left ilium using fluoroscopic guidance MEDICATIONS: None.  ANESTHESIA/SEDATION: Moderate (conscious) sedation was employed during this procedure. A total of Versed 2 mg and Fentanyl 100 mcg was administered intravenously. Moderate Sedation Time: 17 minutes. The patient's level of consciousness and vital signs were monitored continuously by radiology nursing throughout the procedure under my direct supervision. FLUOROSCOPY TIME:  Fluoroscopy Time: 1.3 minutes (9 mGy) COMPLICATIONS: None immediate. PROCEDURE: Informed written consent was obtained from the patient after a thorough discussion of the procedural risks, benefits and alternatives. All questions were addressed. Maximal Sterile Barrier Technique was utilized including caps, mask, sterile gowns, sterile gloves, sterile drape, hand  hygiene and skin antiseptic. A timeout was performed prior to the initiation of the procedure. The patient was placed prone on the exam table. Limited fluoroscopy of the pelvis was performed for planning purposes. Skin entry site was marked, and the overlying skin was prepped and draped in the standard sterile fashion. Local analgesia was obtained with 1% lidocaine. Using fluoroscopic guidance, an 11 gauge needle was advanced just deep to the cortex of the right posterior ilium. Subsequently, bone marrow aspiration was attempted, yielding a dry tap. Core biopsy was performed, demonstrating exceedingly sclerotic bone. View of recent cross-sectional imaging demonstrated that needle was likely within a sclerotic lesion given history of prostate cancer. Therefore, attention was turned to the left side to re-attempt a bone marrow aspiration and core biopsy. Skin entry site was again marked, and the overlying skin was prepped and draped in the standard sterile fashion. Local analgesia was obtained with 1% lidocaine. Using fluoroscopic guidance, an 11 gauge needle was advanced just deep to the cortex of the left posterior ilium. Subsequently, bone marrow aspiration and core biopsy was performed.  Specimens were submitted to lab/pathology for handling. Hemostasis was achieved with manual pressure, and a clean dressing was placed. The patient tolerated the procedure well without immediate complication. IMPRESSION: 1. Successful core needle biopsy of sclerotic bone lesion in the right posterior ilium using fluoroscopic guidance. 2. Successful bone marrow aspiration and core biopsy of the left posterior ilium using fluoroscopic guidance. Electronically Signed   By: Olive Bass M.D.   On: 01/24/2023 09:32   IR CT BONE TROCAR/NEEDLE BIOPSY DEEP  Result Date: 01/24/2023 INDICATION: Acute anemia; history of prostate cancer with metastatic disease EXAM: 1. Bone lesion core needle biopsy of the right ilium using fluoroscopic guidance 2. Bone marrow aspiration and core biopsy of the left ilium using fluoroscopic guidance MEDICATIONS: None. ANESTHESIA/SEDATION: Moderate (conscious) sedation was employed during this procedure. A total of Versed 2 mg and Fentanyl 100 mcg was administered intravenously. Moderate Sedation Time: 17 minutes. The patient's level of consciousness and vital signs were monitored continuously by radiology nursing throughout the procedure under my direct supervision. FLUOROSCOPY TIME:  Fluoroscopy Time: 1.3 minutes (9 mGy) COMPLICATIONS: None immediate. PROCEDURE: Informed written consent was obtained from the patient after a thorough discussion of the procedural risks, benefits and alternatives. All questions were addressed. Maximal Sterile Barrier Technique was utilized including caps, mask, sterile gowns, sterile gloves, sterile drape, hand hygiene and skin antiseptic. A timeout was performed prior to the initiation of the procedure. The patient was placed prone on the exam table. Limited fluoroscopy of the pelvis was performed for planning purposes. Skin entry site was marked, and the overlying skin was prepped and draped in the standard sterile fashion. Local analgesia was obtained with  1% lidocaine. Using fluoroscopic guidance, an 11 gauge needle was advanced just deep to the cortex of the right posterior ilium. Subsequently, bone marrow aspiration was attempted, yielding a dry tap. Core biopsy was performed, demonstrating exceedingly sclerotic bone. View of recent cross-sectional imaging demonstrated that needle was likely within a sclerotic lesion given history of prostate cancer. Therefore, attention was turned to the left side to re-attempt a bone marrow aspiration and core biopsy. Skin entry site was again marked, and the overlying skin was prepped and draped in the standard sterile fashion. Local analgesia was obtained with 1% lidocaine. Using fluoroscopic guidance, an 11 gauge needle was advanced just deep to the cortex of the left posterior ilium. Subsequently, bone marrow aspiration and core biopsy  was performed. Specimens were submitted to lab/pathology for handling. Hemostasis was achieved with manual pressure, and a clean dressing was placed. The patient tolerated the procedure well without immediate complication. IMPRESSION: 1. Successful core needle biopsy of sclerotic bone lesion in the right posterior ilium using fluoroscopic guidance. 2. Successful bone marrow aspiration and core biopsy of the left posterior ilium using fluoroscopic guidance. Electronically Signed   By: Olive Bass M.D.   On: 01/24/2023 09:32    PERFORMANCE STATUS (ECOG) : 1 - Symptomatic but completely ambulatory  Review of Systems Unless otherwise noted, a complete review of systems is negative.  Physical Exam General: NAD Pulmonary: Unlabored Extremities: no edema, no joint deformities Skin: no rashes Neurological: Weakness but otherwise nonfocal  IMPRESSION: I met with patient in fluid clinic.  Patient has pending initiation of Pluvicto.  Treatment is with palliative intent.  Patient's goals are aligned with treatment.  Symptomatically, he reports that he is doing reasonably well.  Has  back pain but says that that is well-managed with as needed use of oxycodone.  Denies any adverse effects from pain medications other than occasional constipation.  Discussed bowel regimen in detail.    He does request refill of oxycodone today.  However, it appears that Dr. Cathie Hoops sent this refill last week.  Per PDMP, does not appear the patient has picked this up yet.  At baseline, patient lives at home with his girlfriend.  However, he is functionally independent with his own care.  PLAN: -Continue current scope of treatment -Continue oxycodone as needed for pain (refill sent in last week) -Daily bowel regimen with MiraLAX/Senokot -Will benefit from MOST Form -Follow-up telephone visit 1 month   Patient expressed understanding and was in agreement with this plan. He also understands that He can call the clinic at any time with any questions, concerns, or complaints.     Time Total: 15 minutes  Visit consisted of counseling and education dealing with the complex and emotionally intense issues of symptom management and palliative care in the setting of serious and potentially life-threatening illness.Greater than 50%  of this time was spent counseling and coordinating care related to the above assessment and plan.  Signed by: Laurette Schimke, PhD, NP-C

## 2023-02-21 NOTE — Assessment & Plan Note (Signed)
Multifactorial, largely due to bone marrow prostate cancer involvement.  Decreased reticulocyte hemoglobin, got empiric IV Venofer.  Today his hemoglobin has improved.

## 2023-02-21 NOTE — Assessment & Plan Note (Signed)
Improved, continue Zofran 8mg  ODT Q8h PRN, compazine 10mg  Q6h PRN

## 2023-02-21 NOTE — Assessment & Plan Note (Addendum)
He was seen by GI during recent admission, was felt to be due to dysmotility.  Continue PPI.  Follow up with GI outpatient for EGD Consider postpone colonoscopy due to his weakness and poor tolerability of prep.

## 2023-02-21 NOTE — Assessment & Plan Note (Addendum)
Refer to dietitian.  Recommend nutrition supplementation.  Poor oral intake, he will get 1L of NS for hydration.

## 2023-02-21 NOTE — Progress Notes (Addendum)
Hematology/Oncology Consult Note [phone] [phone]    CHIEF COMPLAINTS/PURPOSE OF CONSULTATION:  metastatic prostate cancer  ASSESSMENT & PLAN:   Prostate cancer metastatic to bone [facility]) Previous oncology history, treatment records were reviewed. He has history of metastatic prostate cancer with lymph node and bone involvement.  Currently on androgen deprivation therapy with the last dose of Eligard on 10/28/2022 Status post first-line treatment with docetaxel every 3 weeks x 4 cycles, as well as Daralutamide.  He has now developed castration resistant disease. We had a lengthy discussion about neck step options, Pluvicto versus salvage chemotherapy with cabazitaxel. Patient is scheduled to start Pluvicto on 03/06/23 with Dr. Amil Amen Continue  Megace 80mg  BID bridging to treatment.    Normocytic anemia Multifactorial, largely due to bone marrow prostate cancer involvement.  Decreased reticulocyte hemoglobin, got empiric IV Venofer.  Today his hemoglobin has improved.   Dysphagia He was seen by GI during recent admission, was felt to be due to dysmotility.  Continue PPI.  Follow up with GI outpatient for EGD Consider postpone colonoscopy due to his weakness and poor tolerability of prep.   Protein-calorie malnutrition, severe (HCC) Refer to dietitian.  Recommend nutrition supplementation.  Poor oral intake, he will get 1L of NS for hydration.   Nausea without vomiting Improved, continue Zofran 8mg  ODT Q8h PRN, compazine 10mg  Q6h PRN    Neoplasm related pain Continue oxycodone 5mg  Q6h-8h PRN  Hypokalemia IV Kcl 10 meq today, recommend patient to take Potassium chloride BID. Rx sent.  Repeat levels in 1 week.  Hyponatremia Acute on chronic, dated back to 2021.  Hydration session.    Orders Placed This Encounter  Procedures   CBC with Differential (Cancer Center Only)    Standing Status:   Standing    Number of Occurrences:   4     Standing Expiration Date:   02/21/2024   CMP (Cancer Center only)    Standing Status:   Standing    Number of Occurrences:   4    Standing Expiration Date:   02/21/2024   Retic Panel    Standing Status:   Standing    Number of Occurrences:   4    Standing Expiration Date:   02/21/2024   Sample to Blood Bank    Standing Status:   Standing    Number of Occurrences:   4    Standing Expiration Date:   02/21/2024   Follow-up 1 week All questions were answered. The patient knows to call the clinic with any problems, questions or concerns.  Rickard Patience, MD, PhD [facility] Hematology Oncology 02/21/2023    HISTORY OF PRESENTING ILLNESS:  Louis Gardner 62 y.o. male presents to establish care for metastatic prostate cancer I have reviewed his chart and materials related to his cancer extensively and collaborated history with the patient. Summary of oncologic history is as follows: Oncology History  Prostate cancer metastatic to bone [facility])  05/29/2021 Initial Diagnosis   Prostate cancer metastatic to bone (HCC)  03/19/21 - CT A/P with contrast performed to follow up on pancreatic mass, with near resolution of pancreatic uncinate process. Bulky multistation abdominopelvic lymphadenopathy identified (e.g. left para-aortic node measuring 2.1cm and right common iliac node measuring 1.4cm). No concerning osseous lesions identified. Enlarged, heterogeneously enhancing prostate gland. 04/17/21 - PSA 48.83 05/29/21 - retroperitoneal lymph node core needle biopsy, pathology consistent with metastatic prostate adenocarcinoma. 06/27/21 - PSMA-PET scan with "widely metastatic osseous disease as well as avid adenopathy extending from the  pelvis to the left supraclavicular region consistent with metastatic prostate cancer. Noting that there is one single enlarged retroperitoneal node with low/minimal tracer avidity measuring up to 4 cm."  07/31/2022 PSA 406.39 08/27/21- 12/24/2021, 4 cycles. -docetaxel  chemotherapy 75mg /m2 mid April 2023- darolutamide per ARASENS trial in mid April 2023  01/14/2022 PSMA showed marked interval improvement, tracer avid osseous disease now with only 6 lesions that demonstrating mild residual focal avidity  01/29/2023 PSA nadir  0.5 08/21/2022 PSA 3.32 09/25/2022, he developed castration resistant disease -Diffuse sclerotic lesions throughout the skeleton some of which demonstrate Pylarify uptake, as described in the body of the report, concerning for Pylarify avid metastatic disease. Uptake of the Pylarify avid lesions is overall similar to that of the liver.  -New uptake in the right posterior prostate and right seminal vesicle, concerning for malignancy.   Patient was recommended to stop darolutamide.  He was offered Pluvicto versus salvage chemotherapy with cabazitaxel.  He was undecided. 10/28/2022 PSA 18.92.         11/23/2022 Tumor Marker   PSA 117.5   11/23/2022 Imaging   Patient presented emergency room for evaluation of chest pain for 2 days. CT chest angiogram PE protocol 1. No evidence for acute pulmonary embolus. 2. Diffusely abnormal bones with multifocal areas of abnormal increased sclerosis noted throughout the thoracic spine, sternum and ribs concerning for diffuse osseous metastasis. 3. Prominent mediastinal and hilar lymph nodes are identified. These are nonspecific and may be reactive. Nodal metastasis not excluded. 4. Focal area of bronchiectasis with adjacent peripheral tree-in-bud nodularity in the right upper lobe. This is favored to represent sequelae of prior infection or inflammation. 5. Bilateral adrenal gland hyperplasia. 6. Cholelithiasis. 7. Irregular subpleural nodule in the anterior left upper lobe measures 4 mm. 8.  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  Today patient was accompanied by his partner.  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  He has started Megace 80mg  BID. Appetite is slightly better He took part of the colon prep solution but not able to finish. He did not get EGD and Colonoscopy done. He no showed to his appt this week and was reschedule to today.    MEDICAL HISTORY:  Past Medical History:  Diagnosis Date   Arthritis    Depression    GI bleeding    Hypertension    Pancreatic cancer (HCC)    Sleep apnea     SURGICAL HISTORY: Past Surgical History:  Procedure Laterality Date   COLONOSCOPY     COLONOSCOPY WITH PROPOFOL N/A 12/09/2022   Procedure: COLONOSCOPY WITH PROPOFOL;  Surgeon: Wyline Mood, MD;  Location: [facility] ENDOSCOPY;  Service: Gastroenterology;  Laterality: N/A;   ESOPHAGOGASTRODUODENOSCOPY (EGD) WITH PROPOFOL N/A 03/19/2016   Procedure: ESOPHAGOGASTRODUODENOSCOPY (EGD) WITH PROPOFOL;  Surgeon: Christena Deem, MD;  Location: [facility] ENDOSCOPY;  Service: Endoscopy;  Laterality: N/A;   IR BONE MARROW BIOPSY & ASPIRATION  01/24/2023   IR CT BONE TROCAR/NEEDLE BIOPSY DEEP  01/24/2023    SOCIAL HISTORY: Social History   Socioeconomic History   Marital status: Single    Spouse name: Not on file   Number of children: Not on file   Years of education: Not on file  Highest education level: Not on file  Occupational History   Not on file  Tobacco Use   Smoking status: Every Day    Current packs/day: 0.25    Average packs/day: 0.3 packs/day for 48.0 years (12.0 ttl pk-yrs)    Types: Cigarettes    Passive exposure: Current   Smokeless tobacco: Never  Vaping Use   Vaping status: Never Used  Substance and Sexual Activity   Alcohol use: Yes    Alcohol/week: 6.0 standard drinks of alcohol    Types: 6 Cans of beer per week   Drug use: No   Sexual  activity: Not on file  Other Topics Concern   Not on file  Social History Narrative   Not on file   Social Determinants of Health   Financial Resource Strain: Low Risk  (11/27/2022)   Overall Financial Resource Strain (CARDIA)    Difficulty of Paying Living Expenses: Not very hard  Food Insecurity: No Food Insecurity (01/18/2023)   Hunger Vital Sign    Worried About Running Out of Food in the Last Year: Never true    Ran Out of Food in the Last Year: Never true  Recent Concern: Food Insecurity - Food Insecurity Present (11/27/2022)   Hunger Vital Sign    Worried About Running Out of Food in the Last Year: Sometimes true    Ran Out of Food in the Last Year: Never true  Transportation Needs: No Transportation Needs (01/18/2023)   PRAPARE - Administrator, Civil Service (Medical): No    Lack of Transportation (Non-Medical): No  Physical Activity: Not on file  Stress: No Stress Concern Present (11/27/2022)   [employer] of Occupational Health - Occupational Stress Questionnaire    Feeling of Stress : Only a little  Social Connections: Not on file  Intimate Partner Violence: Not At Risk (01/18/2023)   Humiliation, Afraid, Rape, and Kick questionnaire    Fear of Current or Ex-Partner: No    Emotionally Abused: No    Physically Abused: No    Sexually Abused: No    FAMILY HISTORY: History reviewed. No pertinent family history.  ALLERGIES:  has No Known Allergies.  MEDICATIONS:  Current Outpatient Medications  Medication Sig Dispense Refill   amLODipine (NORVASC) 10 MG tablet Take 10 mg by mouth daily.     bisacodyl (DULCOLAX) 10 MG suppository Place 1 suppository (10 mg total) rectally daily as needed for up to 12 doses for moderate constipation. 12 suppository 0   feeding supplement (ENSURE ENLIVE / ENSURE PLUS) LIQD Take 237 mLs by mouth 3 (three) times daily between meals. 237 mL 12   JARDIANCE 10 MG TABS tablet Take 10 mg by mouth daily.     magnesium hydroxide  (MILK OF MAGNESIA) 400 MG/5ML suspension Take 5 mLs by mouth daily as needed for mild constipation.     megestrol (MEGACE) 40 MG tablet Take 2 tablets (80 mg total) by mouth 2 (two) times daily. 120 tablet 0   metFORMIN (GLUCOPHAGE) 500 MG tablet Take 1,000 mg by mouth 2 (two) times daily with a meal.     ondansetron (ZOFRAN-ODT) 8 MG disintegrating tablet Take 1 tablet (8 mg total) by mouth every 8 (eight) hours as needed for nausea. 60 tablet 1   oxyCODONE (ROXICODONE) 5 MG immediate release tablet Take 1 tablet (5 mg total) by mouth every 6 (six) hours as needed for breakthrough pain or severe pain. 90 tablet 0   pantoprazole (PROTONIX) 40 MG tablet Take 1  tablet (40 mg total) by mouth daily. 30 tablet 2   polyethylene glycol (MIRALAX / GLYCOLAX) 17 g packet Take 17 g by mouth daily.     pravastatin (PRAVACHOL) 20 MG tablet Take 20 mg by mouth daily.     prochlorperazine (COMPAZINE) 10 MG tablet Take 1 tablet (10 mg total) by mouth every 6 (six) hours as needed for nausea or vomiting. 90 tablet 0   senna-docusate (SENOKOT-S) 8.6-50 MG tablet Take 1 tablet by mouth daily as needed for up to 30 doses for mild constipation. 30 tablet 0   tamsulosin (FLOMAX) 0.4 MG CAPS capsule Take 1 capsule by mouth daily.     potassium chloride SA (KLOR-CON M) 20 MEQ tablet Take 2 tablets (40 mEq total) by mouth 2 (two) times daily. 120 tablet 0   No current facility-administered medications for this visit.    Review of Systems  Constitutional:  Negative for appetite change, chills, fatigue, fever and unexpected weight change.  HENT:   Negative for hearing loss and voice change.   Eyes:  Negative for eye problems and icterus.  Respiratory:  Negative for chest tightness, cough and shortness of breath.   Cardiovascular:  Negative for chest pain and leg swelling.  Gastrointestinal:  Negative for abdominal distention and abdominal pain.  Endocrine: Negative for hot flashes.  Genitourinary:  Negative for  difficulty urinating, dysuria and frequency.        Foley catheter  Musculoskeletal:  Negative for arthralgias.  Skin:  Negative for itching and rash.  Neurological:  Negative for light-headedness and numbness.  Hematological:  Negative for adenopathy. Does not bruise/bleed easily.  Psychiatric/Behavioral:  Negative for confusion.      PHYSICAL EXAMINATION: ECOG PERFORMANCE STATUS: 1 - Symptomatic but completely ambulatory  Vitals:   02/21/23 1029  BP: 108/71  Pulse: 94  Resp: 19  Temp: 98.6 F (37 C)  SpO2: 100%   Filed Weights   02/21/23 1029  Weight: 120 lb 4.8 oz (54.6 kg)    Physical Exam   LABORATORY DATA:  I have reviewed the data as listed    Latest Ref Rng & Units 02/21/2023   10:07 AM 02/12/2023    8:45 AM 02/03/2023    7:22 AM  CBC  WBC 4.0 - 10.5 K/uL 12.8  9.7  8.7   Hemoglobin 13.0 - 17.0 g/dL 8.9  8.4  8.4   Hematocrit 39.0 - 52.0 % 28.3  26.0  26.4   Platelets 150 - 400 K/uL 578  551  503       Latest Ref Rng & Units 02/21/2023   10:07 AM 02/03/2023    8:47 AM 01/24/2023    5:38 AM  CMP  Glucose 70 - 99 mg/dL 865  784  696   BUN 8 - 23 mg/dL 11  10  8    Creatinine 0.61 - 1.24 mg/dL 2.95  2.84  1.32   Sodium 135 - 145 mmol/L 125  130  125   Potassium 3.5 - 5.1 mmol/L 2.8  3.0  3.7   Chloride 98 - 111 mmol/L 84  90  89   CO2 22 - 32 mmol/L 27  26  25    Calcium 8.9 - 10.3 mg/dL 9.1  8.6  8.4   Total Protein 6.5 - 8.1 g/dL 8.8     Total Bilirubin 0.3 - 1.2 mg/dL 0.5     Alkaline Phos 38 - 126 U/L 192     AST 15 - 41 U/L 35  ALT 0 - 44 U/L 11        RADIOGRAPHIC STUDIES: I have personally reviewed the radiological images as listed and agreed with the findings in the report. DG Chest 2 View  Result Date: 02/03/2023 CLINICAL DATA:  Chest pain. EXAM: CHEST - 2 VIEW COMPARISON:  January 18, 2023. FINDINGS: The heart size and mediastinal contours are within normal limits. Both lungs are clear. The visualized skeletal structures are  unremarkable. IMPRESSION: No active cardiopulmonary disease. Electronically Signed   By: Lupita Raider M.D.   On: 02/03/2023 08:24   IR BONE MARROW BIOPSY & ASPIRATION  Result Date: 01/24/2023 INDICATION: Acute anemia; history of prostate cancer with metastatic disease EXAM: 1. Bone lesion core needle biopsy of the right ilium using fluoroscopic guidance 2. Bone marrow aspiration and core biopsy of the left ilium using fluoroscopic guidance MEDICATIONS: None. ANESTHESIA/SEDATION: Moderate (conscious) sedation was employed during this procedure. A total of Versed 2 mg and Fentanyl 100 mcg was administered intravenously. Moderate Sedation Time: 17 minutes. The patient's level of consciousness and vital signs were monitored continuously by radiology nursing throughout the procedure under my direct supervision. FLUOROSCOPY TIME:  Fluoroscopy Time: 1.3 minutes (9 mGy) COMPLICATIONS: None immediate. PROCEDURE: Informed written consent was obtained from the patient after a thorough discussion of the procedural risks, benefits and alternatives. All questions were addressed. Maximal Sterile Barrier Technique was utilized including caps, mask, sterile gowns, sterile gloves, sterile drape, hand hygiene and skin antiseptic. A timeout was performed prior to the initiation of the procedure. The patient was placed prone on the exam table. Limited fluoroscopy of the pelvis was performed for planning purposes. Skin entry site was marked, and the overlying skin was prepped and draped in the standard sterile fashion. Local analgesia was obtained with 1% lidocaine. Using fluoroscopic guidance, an 11 gauge needle was advanced just deep to the cortex of the right posterior ilium. Subsequently, bone marrow aspiration was attempted, yielding a dry tap. Core biopsy was performed, demonstrating exceedingly sclerotic bone. View of recent cross-sectional imaging demonstrated that needle was likely within a sclerotic lesion given history  of prostate cancer. Therefore, attention was turned to the left side to re-attempt a bone marrow aspiration and core biopsy. Skin entry site was again marked, and the overlying skin was prepped and draped in the standard sterile fashion. Local analgesia was obtained with 1% lidocaine. Using fluoroscopic guidance, an 11 gauge needle was advanced just deep to the cortex of the left posterior ilium. Subsequently, bone marrow aspiration and core biopsy was performed. Specimens were submitted to lab/pathology for handling. Hemostasis was achieved with manual pressure, and a clean dressing was placed. The patient tolerated the procedure well without immediate complication. IMPRESSION: 1. Successful core needle biopsy of sclerotic bone lesion in the right posterior ilium using fluoroscopic guidance. 2. Successful bone marrow aspiration and core biopsy of the left posterior ilium using fluoroscopic guidance. Electronically Signed   By: Olive Bass M.D.   On: 01/24/2023 09:32   IR CT BONE TROCAR/NEEDLE BIOPSY DEEP  Result Date: 01/24/2023 INDICATION: Acute anemia; history of prostate cancer with metastatic disease EXAM: 1. Bone lesion core needle biopsy of the right ilium using fluoroscopic guidance 2. Bone marrow aspiration and core biopsy of the left ilium using fluoroscopic guidance MEDICATIONS: None. ANESTHESIA/SEDATION: Moderate (conscious) sedation was employed during this procedure. A total of Versed 2 mg and Fentanyl 100 mcg was administered intravenously. Moderate Sedation Time: 17 minutes. The patient's level of consciousness and vital signs were  monitored continuously by radiology nursing throughout the procedure under my direct supervision. FLUOROSCOPY TIME:  Fluoroscopy Time: 1.3 minutes (9 mGy) COMPLICATIONS: None immediate. PROCEDURE: Informed written consent was obtained from the patient after a thorough discussion of the procedural risks, benefits and alternatives. All questions were addressed.  Maximal Sterile Barrier Technique was utilized including caps, mask, sterile gowns, sterile gloves, sterile drape, hand hygiene and skin antiseptic. A timeout was performed prior to the initiation of the procedure. The patient was placed prone on the exam table. Limited fluoroscopy of the pelvis was performed for planning purposes. Skin entry site was marked, and the overlying skin was prepped and draped in the standard sterile fashion. Local analgesia was obtained with 1% lidocaine. Using fluoroscopic guidance, an 11 gauge needle was advanced just deep to the cortex of the right posterior ilium. Subsequently, bone marrow aspiration was attempted, yielding a dry tap. Core biopsy was performed, demonstrating exceedingly sclerotic bone. View of recent cross-sectional imaging demonstrated that needle was likely within a sclerotic lesion given history of prostate cancer. Therefore, attention was turned to the left side to re-attempt a bone marrow aspiration and core biopsy. Skin entry site was again marked, and the overlying skin was prepped and draped in the standard sterile fashion. Local analgesia was obtained with 1% lidocaine. Using fluoroscopic guidance, an 11 gauge needle was advanced just deep to the cortex of the left posterior ilium. Subsequently, bone marrow aspiration and core biopsy was performed. Specimens were submitted to lab/pathology for handling. Hemostasis was achieved with manual pressure, and a clean dressing was placed. The patient tolerated the procedure well without immediate complication. IMPRESSION: 1. Successful core needle biopsy of sclerotic bone lesion in the right posterior ilium using fluoroscopic guidance. 2. Successful bone marrow aspiration and core biopsy of the left posterior ilium using fluoroscopic guidance. Electronically Signed   By: Olive Bass M.D.   On: 01/24/2023 09:32

## 2023-02-21 NOTE — Assessment & Plan Note (Signed)
IV Kcl 10 meq today, recommend patient to take Potassium chloride BID. Rx sent.  Repeat levels in 1 week.

## 2023-02-21 NOTE — Assessment & Plan Note (Addendum)
Previous oncology history, treatment records were reviewed. He has history of metastatic prostate cancer with lymph node and bone involvement.  Currently on androgen deprivation therapy with the last dose of Eligard on 10/28/2022 Status post first-line treatment with docetaxel every 3 weeks x 4 cycles, as well as Daralutamide.  He has now developed castration resistant disease. We had a lengthy discussion about neck step options, Pluvicto versus salvage chemotherapy with cabazitaxel. Patient is scheduled to start Pluvicto on 03/06/23 with Dr. Amil Amen Continue  Megace 80mg  BID bridging to treatment.

## 2023-02-21 NOTE — Assessment & Plan Note (Signed)
Continue oxycodone 5mg  Q6h-8h PRN

## 2023-02-22 LAB — TESTOSTERONE: Testosterone: 22 ng/dL — ABNORMAL LOW (ref 264–916)

## 2023-02-23 ENCOUNTER — Other Ambulatory Visit: Payer: Self-pay

## 2023-02-23 ENCOUNTER — Emergency Department
Admission: EM | Admit: 2023-02-23 | Discharge: 2023-02-23 | Disposition: A | Discharge status: Home / Self Care | Payer: Medicaid Other | Attending: Emergency Medicine | Admitting: Emergency Medicine

## 2023-02-23 DIAGNOSIS — E871 Hypo-osmolality and hyponatremia: Secondary | ICD-10-CM | POA: Diagnosis not present

## 2023-02-23 DIAGNOSIS — E876 Hypokalemia: Secondary | ICD-10-CM | POA: Insufficient documentation

## 2023-02-23 DIAGNOSIS — Z5321 Procedure and treatment not carried out due to patient leaving prior to being seen by health care provider: Secondary | ICD-10-CM | POA: Diagnosis not present

## 2023-02-23 DIAGNOSIS — C61 Malignant neoplasm of prostate: Secondary | ICD-10-CM | POA: Diagnosis not present

## 2023-02-23 DIAGNOSIS — R110 Nausea: Secondary | ICD-10-CM | POA: Insufficient documentation

## 2023-02-23 LAB — COMPREHENSIVE METABOLIC PANEL
ALT: 11 U/L (ref 0–44)
AST: 24 U/L (ref 15–41)
Albumin: 2.6 g/dL — ABNORMAL LOW (ref 3.5–5.0)
Alkaline Phosphatase: 169 U/L — ABNORMAL HIGH (ref 38–126)
Anion gap: 15 (ref 5–15)
BUN: 11 mg/dL (ref 8–23)
CO2: 25 mmol/L (ref 22–32)
Calcium: 8.6 mg/dL — ABNORMAL LOW (ref 8.9–10.3)
Chloride: 85 mmol/L — ABNORMAL LOW (ref 98–111)
Creatinine, Ser: 0.82 mg/dL (ref 0.61–1.24)
GFR, Estimated: >60 mL/min (ref >60)
Glucose, Bld: 232 mg/dL — ABNORMAL HIGH (ref 70–99)
Potassium: 3.1 mmol/L — ABNORMAL LOW (ref 3.5–5.1)
Sodium: 125 mmol/L — ABNORMAL LOW (ref 135–145)
Total Bilirubin: 1.2 mg/dL (ref 0.3–1.2)
Total Protein: 7.9 g/dL (ref 6.5–8.1)

## 2023-02-23 LAB — CBC
HCT: 26 % — ABNORMAL LOW (ref 39.0–52.0)
Hemoglobin: 7.9 g/dL — ABNORMAL LOW (ref 13.0–17.0)
MCH: 23.2 pg — ABNORMAL LOW (ref 26.0–34.0)
MCHC: 30.4 g/dL (ref 30.0–36.0)
MCV: 76.2 fL — ABNORMAL LOW (ref 80.0–100.0)
Platelets: 504 10*3/uL — ABNORMAL HIGH (ref 150–400)
RBC: 3.41 MIL/uL — ABNORMAL LOW (ref 4.22–5.81)
RDW: 19.6 % — ABNORMAL HIGH (ref 11.5–15.5)
WBC: 18.8 10*3/uL — ABNORMAL HIGH (ref 4.0–10.5)
nRBC: 0 % (ref 0.0–0.2)

## 2023-02-23 LAB — LIPASE, BLOOD: Lipase: 21 U/L (ref 11–51)

## 2023-02-23 NOTE — ED Triage Notes (Signed)
Pt comes via EMs from home with c/o nausea. Pt denies any pain or vomiting currently. Pt states week ago. Pt states he can eat but it won't stay down.

## 2023-02-23 NOTE — ED Triage Notes (Signed)
Pt in via EMS from home with c/o nausea. Pt has prostate cancer, last radiation was 9/30 and he has been nauseated since then. CBG 254, 98.9 temp, 123/74, 97% RA, HR 110. Per family, his sodium and K+ was low.

## 2023-02-24 ENCOUNTER — Ambulatory Visit: Admit: 2023-02-24 | Discharge: 2023-02-25

## 2023-02-24 ENCOUNTER — Other Ambulatory Visit: Admit: 2023-02-24 | Discharge: 2023-02-25

## 2023-02-24 ENCOUNTER — Telehealth: Payer: Self-pay

## 2023-02-24 DIAGNOSIS — C775 Secondary and unspecified malignant neoplasm of intrapelvic lymph nodes: Principal | ICD-10-CM

## 2023-02-24 DIAGNOSIS — C61 Malignant neoplasm of prostate: Principal | ICD-10-CM

## 2023-02-24 NOTE — Telephone Encounter (Signed)
Patient called in to schedule an office visit for EGD and colonoscopy.

## 2023-02-25 ENCOUNTER — Ambulatory Visit: Payer: Medicaid Other | Admitting: Oncology

## 2023-02-25 ENCOUNTER — Other Ambulatory Visit: Payer: Medicaid Other

## 2023-02-25 ENCOUNTER — Ambulatory Visit: Payer: Medicaid Other

## 2023-02-25 MED ORDER — CIPROFLOXACIN 500 MG TABLET
ORAL_TABLET | Freq: Two times a day (BID) | ORAL | 0 refills | 7 days | Status: CP
Start: 2023-02-25 — End: ?

## 2023-02-26 ENCOUNTER — Telehealth: Payer: Self-pay

## 2023-02-27 ENCOUNTER — Inpatient Hospital Stay: Payer: Medicaid Other

## 2023-02-27 ENCOUNTER — Inpatient Hospital Stay (HOSPITAL_COMMUNITY): Admission: RE | Admit: 2023-02-27 | Payer: Medicaid Other | Source: Ambulatory Visit

## 2023-02-27 ENCOUNTER — Inpatient Hospital Stay: Payer: Medicaid Other | Admitting: Hospice and Palliative Medicine

## 2023-02-27 ENCOUNTER — Encounter: Payer: Self-pay | Admitting: Hospice and Palliative Medicine

## 2023-02-27 ENCOUNTER — Ambulatory Visit: Payer: Medicaid Other | Admitting: Nurse Practitioner

## 2023-02-27 VITALS — BP 100/67 | HR 103 | Temp 95.2°F | Ht 69.0 in | Wt 119.5 lb

## 2023-02-27 DIAGNOSIS — D649 Anemia, unspecified: Secondary | ICD-10-CM

## 2023-02-27 DIAGNOSIS — E876 Hypokalemia: Secondary | ICD-10-CM

## 2023-02-27 DIAGNOSIS — R531 Weakness: Secondary | ICD-10-CM

## 2023-02-27 DIAGNOSIS — C61 Malignant neoplasm of prostate: Secondary | ICD-10-CM

## 2023-02-27 DIAGNOSIS — C7951 Secondary malignant neoplasm of bone: Secondary | ICD-10-CM

## 2023-02-27 LAB — CBC WITH DIFFERENTIAL (CANCER CENTER ONLY)
Abs Immature Granulocytes: 0.51 10*3/uL — ABNORMAL HIGH (ref 0.00–0.07)
Basophils Absolute: 0 10*3/uL (ref 0.0–0.1)
Basophils Relative: 0 %
Eosinophils Absolute: 0 10*3/uL (ref 0.0–0.5)
Eosinophils Relative: 0 %
HCT: 24.7 % — ABNORMAL LOW (ref 39.0–52.0)
Hemoglobin: 7.7 g/dL — ABNORMAL LOW (ref 13.0–17.0)
Immature Granulocytes: 4 %
Lymphocytes Relative: 23 %
Lymphs Abs: 2.9 10*3/uL (ref 0.7–4.0)
MCH: 23.2 pg — ABNORMAL LOW (ref 26.0–34.0)
MCHC: 31.2 g/dL (ref 30.0–36.0)
MCV: 74.4 fL — ABNORMAL LOW (ref 80.0–100.0)
Monocytes Absolute: 1.2 10*3/uL — ABNORMAL HIGH (ref 0.1–1.0)
Monocytes Relative: 9 %
Neutro Abs: 8.2 10*3/uL — ABNORMAL HIGH (ref 1.7–7.7)
Neutrophils Relative %: 64 %
Platelet Count: 456 10*3/uL — ABNORMAL HIGH (ref 150–400)
RBC: 3.32 MIL/uL — ABNORMAL LOW (ref 4.22–5.81)
RDW: 19.1 % — ABNORMAL HIGH (ref 11.5–15.5)
WBC Count: 12.8 10*3/uL — ABNORMAL HIGH (ref 4.0–10.5)
nRBC: 0 % (ref 0.0–0.2)

## 2023-02-27 LAB — CMP (CANCER CENTER ONLY)
ALT: 7 U/L (ref 0–44)
AST: 15 U/L (ref 15–41)
Albumin: 2.9 g/dL — ABNORMAL LOW (ref 3.5–5.0)
Alkaline Phosphatase: 150 U/L — ABNORMAL HIGH (ref 38–126)
Anion gap: 13 (ref 5–15)
BUN: 8 mg/dL (ref 8–23)
CO2: 28 mmol/L (ref 22–32)
Calcium: 8.8 mg/dL — ABNORMAL LOW (ref 8.9–10.3)
Chloride: 86 mmol/L — ABNORMAL LOW (ref 98–111)
Creatinine: 0.64 mg/dL (ref 0.61–1.24)
GFR, Estimated: >60 mL/min (ref >60)
Glucose, Bld: 154 mg/dL — ABNORMAL HIGH (ref 70–99)
Potassium: 2.7 mmol/L — CL (ref 3.5–5.1)
Sodium: 127 mmol/L — ABNORMAL LOW (ref 135–145)
Total Bilirubin: 0.9 mg/dL (ref 0.3–1.2)
Total Protein: 8 g/dL (ref 6.5–8.1)

## 2023-02-27 LAB — RETIC PANEL
Immature Retic Fract: 35.1 % — ABNORMAL HIGH (ref 2.3–15.9)
RBC.: 3.31 MIL/uL — ABNORMAL LOW (ref 4.22–5.81)
Retic Count, Absolute: 61.9 10*3/uL (ref 19.0–186.0)
Retic Ct Pct: 1.9 % (ref 0.4–3.1)
Reticulocyte Hemoglobin: 25.5 pg — ABNORMAL LOW (ref >27.9)

## 2023-02-27 LAB — PREPARE RBC (CROSSMATCH)

## 2023-02-27 LAB — SAMPLE TO BLOOD BANK

## 2023-02-27 MED ORDER — POTASSIUM CHLORIDE 10 MEQ/100ML IV SOLN
10.0000 meq | INTRAVENOUS | Status: DC
Start: 2023-02-27 — End: 2023-02-27

## 2023-02-27 MED ORDER — POTASSIUM CHLORIDE 10 MEQ/100ML IV SOLN
10.0000 meq | INTRAVENOUS | Status: AC
Start: 2023-02-27 — End: 2023-02-27
  Administered 2023-02-27 (×4): 10 meq via INTRAVENOUS
  Filled 2023-02-27: qty 100

## 2023-02-27 MED ORDER — SODIUM CHLORIDE 0.9 % IV SOLN
40.0000 meq | Freq: Once | INTRAVENOUS | Status: DC
Start: 2023-02-27 — End: 2023-02-27

## 2023-02-27 MED ORDER — SODIUM CHLORIDE 0.9 % IV SOLN
Freq: Once | INTRAVENOUS | Status: AC
Start: 2023-02-27 — End: 2023-02-27
  Filled 2023-02-27: qty 250

## 2023-02-27 NOTE — Progress Notes (Signed)
On cipro for uti. He states it may be to strong, making him vomit bile. Should rx be switched to something else?  States he was recently seen at [facility] and was told he needed blood.

## 2023-02-27 NOTE — Progress Notes (Signed)
Symptom Management Clinic [facility] at [facility] [phone] [phone]  Patient Care Team: [facility] as PCP - General Debbe Odea, MD as PCP - Cardiology (Cardiology) Rickard Patience, MD as Consulting Physician (Oncology)   NAME OF PATIENT: Louis Gardner  [id number]  [birth year]   DATE OF VISIT: 02/27/23  REASON FOR CONSULT: Louis Gardner is a 62 y.o. 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   INTERVAL HISTORY: Patient saw Dr. Hyacinth Meeker at [facility] on 02/24/2023 with plan to initiate Pluvicto.  Likely Pluvicto is final treatment option.  With started on Cipro for UTI.  Patient presents [facility] today for labs and supportive care.  Says that he had an episode of nausea and vomiting this morning.  States that his appetite is good.  Denies pain.  No fever or chills.  Admits that he has not taking medications as directed such as his potassium.  Denies any neurologic complaints. Denies recent fevers or illnesses. Denies any easy bleeding or bruising. Denies chest pain. Denies urinary complaints. Patient offers no further specific complaints today.   PAST MEDICAL HISTORY: Past Medical History:  Diagnosis Date   Arthritis    Depression    GI bleeding    Hypertension    Pancreatic cancer (HCC)    Sleep apnea     PAST SURGICAL HISTORY:  Past Surgical History:  Procedure Laterality Date   COLONOSCOPY     COLONOSCOPY WITH PROPOFOL N/A 12/09/2022   Procedure: COLONOSCOPY WITH PROPOFOL;  Surgeon: Wyline Mood, MD;  Location: [facility] ENDOSCOPY;  Service: Gastroenterology;  Laterality: N/A;   ESOPHAGOGASTRODUODENOSCOPY (EGD) WITH PROPOFOL N/A 03/19/2016   Procedure: ESOPHAGOGASTRODUODENOSCOPY (EGD) WITH PROPOFOL;  Surgeon: Christena Deem, MD;  Location: [facility] ENDOSCOPY;  Service: Endoscopy;  Laterality: N/A;   IR BONE MARROW BIOPSY & ASPIRATION  01/24/2023   IR CT  BONE TROCAR/NEEDLE BIOPSY DEEP  01/24/2023    HEMATOLOGY/ONCOLOGY HISTORY:  Oncology History  Prostate cancer metastatic to bone (HCC)  05/29/2021 Initial Diagnosis   Prostate cancer metastatic to bone (HCC)  03/19/21 - CT A/P with contrast performed to follow up on pancreatic mass, with near resolution of pancreatic uncinate process. Bulky multistation abdominopelvic lymphadenopathy identified (e.g. left para-aortic node measuring 2.1cm and right common iliac node measuring 1.4cm). No concerning osseous lesions identified. Enlarged, heterogeneously enhancing prostate gland. 04/17/21 - PSA 48.83 05/29/21 - retroperitoneal lymph node core needle biopsy, pathology consistent with metastatic prostate adenocarcinoma. 06/27/21 - PSMA-PET scan with "widely metastatic osseous disease as well as avid adenopathy extending from the pelvis to the left supraclavicular region consistent with metastatic prostate cancer. Noting that there is one single enlarged retroperitoneal node with low/minimal tracer avidity measuring up to 4 cm."  07/31/2022 PSA 406.39 08/27/21- 12/24/2021, 4 cycles. -docetaxel chemotherapy 75mg /m2 mid April 2023- darolutamide per ARASENS trial in mid April 2023  01/14/2022 PSMA showed marked interval improvement, tracer avid osseous disease now with only 6 lesions that demonstrating mild residual focal avidity  01/29/2023 PSA nadir  0.5 08/21/2022 PSA 3.32 09/25/2022, he developed castration resistant disease -Diffuse sclerotic lesions throughout the skeleton some of which demonstrate Pylarify uptake, as described in the body of the report, concerning for Pylarify avid metastatic disease. Uptake of the Pylarify avid lesions is overall similar to that of the liver.  -New uptake in the right posterior prostate and right seminal vesicle, concerning for malignancy.   Patient was recommended to stop darolutamide.  He was  offered Pluvicto versus salvage chemotherapy with cabazitaxel.  He was  undecided. 10/28/2022 PSA 18.92.         11/23/2022 Tumor Marker   PSA 117.5   11/23/2022 Imaging   Patient presented emergency room for evaluation of chest pain for 2 days. CT chest angiogram PE protocol 1. No evidence for acute pulmonary embolus. 2. Diffusely abnormal bones with multifocal areas of abnormal increased sclerosis noted throughout the thoracic spine, sternum and ribs concerning for diffuse osseous metastasis. 3. Prominent mediastinal and hilar lymph nodes are identified. These are nonspecific and may be reactive. Nodal metastasis not excluded. 4. Focal area of bronchiectasis with adjacent peripheral tree-in-bud nodularity in the right upper lobe. This is favored to represent sequelae of prior infection or inflammation. 5. Bilateral adrenal gland hyperplasia. 6. Cholelithiasis. 7. Irregular subpleural nodule in the anterior left upper lobe measures 4 mm. 8.  Aortic Atherosclerosis   11/27/2022 Cancer Staging   Staging form: Prostate, AJCC 8th Edition - Pathologic: Stage IVB (pT2, pN1, cM1, PSA: 48) - Signed by Rickard Patience, MD on 11/27/2022 Stage prefix: Initial diagnosis Prostate specific antigen (PSA) range: 20 or greater     ALLERGIES:  has No Known Allergies.  MEDICATIONS:  Current Outpatient Medications  Medication Sig Dispense Refill   amLODipine (NORVASC) 10 MG tablet Take 10 mg by mouth daily.     bisacodyl (DULCOLAX) 10 MG suppository Place 1 suppository (10 mg total) rectally daily as needed for up to 12 doses for moderate constipation. 12 suppository 0   ciprofloxacin (CIPRO) 500 MG tablet Take by mouth.     feeding supplement (ENSURE ENLIVE / ENSURE PLUS) LIQD Take 237 mLs by mouth 3 (three) times daily between meals. 237 mL 12   JARDIANCE 10 MG TABS tablet Take 10 mg by mouth daily.     magnesium hydroxide (MILK OF MAGNESIA) 400 MG/5ML suspension Take 5 mLs by mouth daily as needed for mild constipation.     megestrol (MEGACE) 40 MG tablet Take 2  tablets (80 mg total) by mouth 2 (two) times daily. 120 tablet 0   metFORMIN (GLUCOPHAGE) 500 MG tablet Take 1,000 mg by mouth 2 (two) times daily with a meal.     ondansetron (ZOFRAN-ODT) 8 MG disintegrating tablet Take 1 tablet (8 mg total) by mouth every 8 (eight) hours as needed for nausea. 60 tablet 1   oxyCODONE (ROXICODONE) 5 MG immediate release tablet Take 1 tablet (5 mg total) by mouth every 6 (six) hours as needed for breakthrough pain or severe pain. 90 tablet 0   pantoprazole (PROTONIX) 40 MG tablet Take 1 tablet (40 mg total) by mouth daily. 30 tablet 2   polyethylene glycol (MIRALAX / GLYCOLAX) 17 g packet Take 17 g by mouth daily.     potassium chloride SA (KLOR-CON M) 20 MEQ tablet Take 2 tablets (40 mEq total) by mouth 2 (two) times daily. 120 tablet 0   pravastatin (PRAVACHOL) 20 MG tablet Take 20 mg by mouth daily.     prochlorperazine (COMPAZINE) 10 MG tablet Take 1 tablet (10 mg total) by mouth every 6 (six) hours as needed for nausea or vomiting. 90 tablet 0   senna-docusate (SENOKOT-S) 8.6-50 MG tablet Take 1 tablet by mouth daily as needed for up to 30 doses for mild constipation. 30 tablet 0   tamsulosin (FLOMAX) 0.4 MG CAPS capsule Take 1 capsule by mouth daily.     No current facility-administered medications for this visit.   Facility-Administered Medications Ordered in Other  Visits  Medication Dose Route Frequency Provider Last Rate Last Admin   0.9 %  sodium chloride infusion   Intravenous Once Moosa Bueche, Daryl Eastern, NP 250 mL/hr at 02/27/23 1152 New Bag at 02/27/23 1152   potassium chloride 10 mEq in 100 mL IVPB  10 mEq Intravenous Q1 Hr x 4 Aleane Wesenberg, Daryl Eastern, NP 100 mL/hr at 02/27/23 1152 10 mEq at 02/27/23 1152    VITAL SIGNS: BP 100/67 (BP Location: Left Arm, Patient Position: Sitting, Cuff Size: Normal)   Pulse (!) 103   Temp (!) 95.2 F (35.1 C) (Tympanic)   Ht 5\' 9"  (1.753 m)   Wt 119 lb 8 oz (54.2 kg)   SpO2 97%   BMI 17.65 kg/m  Filed Weights    02/27/23 0954  Weight: 119 lb 8 oz (54.2 kg)    Estimated body mass index is 17.65 kg/m as calculated from the following:   Height as of this encounter: 5\' 9"  (1.753 m).   Weight as of this encounter: 119 lb 8 oz (54.2 kg).  LABS: CBC:    Component Value Date/Time   WBC 12.8 (H) 02/27/2023 0950   WBC 18.8 (H) 02/23/2023 1158   HGB 7.7 (L) 02/27/2023 0950   HGB 11.4 (L) 09/08/2013 1436   HCT 24.7 (L) 02/27/2023 0950   HCT 36.3 (L) 09/08/2013 1436   PLT 456 (H) 02/27/2023 0950   PLT 315 09/08/2013 1436   MCV 74.4 (L) 02/27/2023 0950   MCV 83 09/08/2013 1436   NEUTROABS 8.2 (H) 02/27/2023 0950   NEUTROABS 1.8 08/05/2013 0616   LYMPHSABS 2.9 02/27/2023 0950   LYMPHSABS 1.6 08/05/2013 0616   MONOABS 1.2 (H) 02/27/2023 0950   MONOABS 0.8 08/05/2013 0616   EOSABS 0.0 02/27/2023 0950   EOSABS 0.1 08/05/2013 0616   BASOSABS 0.0 02/27/2023 0950   BASOSABS 1 09/08/2013 1436   BASOSABS 0.0 08/05/2013 0616   Comprehensive Metabolic Panel:    Component Value Date/Time   NA 127 (L) 02/27/2023 0950   NA 138 08/04/2013 0842   K 2.7 (LL) 02/27/2023 0950   K 3.9 08/04/2013 0842   CL 86 (L) 02/27/2023 0950   CL 106 08/04/2013 0842   CO2 28 02/27/2023 0950   CO2 28 08/04/2013 0842   BUN 8 02/27/2023 0950   BUN 5 (L) 08/04/2013 0842   CREATININE 0.64 02/27/2023 0950   CREATININE 1.32 (H) 08/04/2013 0842   GLUCOSE 154 (H) 02/27/2023 0950   GLUCOSE 116 (H) 08/04/2013 0842   CALCIUM 8.8 (L) 02/27/2023 0950   CALCIUM 8.4 (L) 08/04/2013 0842   AST 15 02/27/2023 0950   ALT 7 02/27/2023 0950   ALT 58 08/02/2013 1135   ALKPHOS 150 (H) 02/27/2023 0950   ALKPHOS 62 08/02/2013 1135   BILITOT 0.9 02/27/2023 0950   PROT 8.0 02/27/2023 0950   PROT 8.8 (H) 08/02/2013 1135   ALBUMIN 2.9 (L) 02/27/2023 0950   ALBUMIN 4.2 08/02/2013 1135    RADIOGRAPHIC STUDIES: DG Chest 2 View  Result Date: 02/03/2023 CLINICAL DATA:  Chest pain. EXAM: CHEST - 2 VIEW COMPARISON:  January 18, 2023.  FINDINGS: The heart size and mediastinal contours are within normal limits. Both lungs are clear. The visualized skeletal structures are unremarkable. IMPRESSION: No active cardiopulmonary disease. Electronically Signed   By: Lupita Raider M.D.   On: 02/03/2023 08:24    PERFORMANCE STATUS (ECOG) : 2 - Symptomatic, <50% confined to bed  Review of Systems Unless otherwise noted, a complete review of systems  is negative.  Physical Exam General: NAD Cardiovascular: regular rate and rhythm Pulmonary: clear ant fields Abdomen: soft, nontender, + bowel sounds GU: no suprapubic tenderness Extremities: no edema, no joint deformities Skin: no rashes Neurological: Weakness but otherwise nonfocal  IMPRESSION/PLAN: Metastatic castrate resistant prostate cancer -plan to initiate Pluvicto.  On Megace presently and Eligard.  Likely will need hospice if Pluvicto does not result in inadequate treatment response.  Hypokalemia -patient admits that he is not regularly taking his oral potassium supplement.  Will proceed with IV potassium repletion today.  Patient strongly advised to take potassium as directed and we discussed the possible severe effects of hypokalemia such as cardiac arrhythmia and death.  Anemia -Labs reviewed with Dr. Cathie Hoops.  Will hold Venofer today and proceed with blood transfusion tomorrow.  Return tomorrow for blood.  Patient will RTC in 1 week for repeat labs/fluids and then will see MD the following week   Patient expressed understanding and was in agreement with this plan. He also understands that He can call clinic at any time with any questions, concerns, or complaints.   Thank you for allowing me to participate in the care of this very pleasant patient.   Time Total: 25 minutes  Visit consisted of counseling and education dealing with the complex and emotionally intense issues of symptom management in the setting of serious illness.Greater than 50%  of this time was spent  counseling and coordinating care related to the above assessment and plan.  Signed by: Laurette Schimke, PhD, NP-C

## 2023-02-27 NOTE — Patient Instructions (Signed)
Hypokalemia Hypokalemia means that the amount of potassium in the blood is lower than normal. Potassium is a mineral (electrolyte) that helps regulate the amount of fluid in the body. It also stimulates muscle tightening (contraction) and helps nerves work properly. Normally, most of the body's potassium is inside cells, and only a very small amount is in the blood. Because the amount in the blood is so small, minor changes to potassium levels in the blood can be life-threatening. What are the causes? This condition may be caused by: Antibiotic medicine. Diarrhea or vomiting. Taking too much of a medicine that helps you have a bowel movement (laxative) can cause diarrhea and lead to hypokalemia. Chronic kidney disease (CKD). Medicines that help the body get rid of excess fluid (diuretics). Eating disorders, such as anorexia or bulimia. Low magnesium levels in the body. Sweating a lot. What are the signs or symptoms? Symptoms of this condition include: Weakness. Constipation. Fatigue. Muscle cramps. Mental confusion. Skipped heartbeats or irregular heartbeat (palpitations). Tingling or numbness. How is this diagnosed? This condition is diagnosed with a blood test. How is this treated? This condition may be treated by: Taking potassium supplements. Adjusting the medicines that you take. Eating more foods that contain a lot of potassium. If your potassium level is very low, you may need to get potassium through an IV and be monitored in the hospital. Follow these instructions at home: Eating and drinking  Eat a healthy diet. A healthy diet includes fresh fruits and vegetables, whole grains, healthy fats, and lean proteins. If told, eat more foods that contain a lot of potassium. These include: Nuts, such as peanuts and pistachios. Seeds, such as sunflower seeds and pumpkin seeds. Peas, lentils, and lima beans. Whole grain and bran cereals and breads. Fresh fruits and vegetables,  such as apricots, avocado, bananas, cantaloupe, kiwi, oranges, tomatoes, asparagus, and potatoes. Juices, such as orange, tomato, and prune. Lean meats, including fish. Milk and milk products, such as yogurt. General instructions Take over-the-counter and prescription medicines only as told by your health care provider. This includes vitamins, natural food products, and supplements. Keep all follow-up visits. This is important. Contact a health care provider if: You have weakness that gets worse. You feel your heart pounding or racing. You vomit. You have diarrhea. You have diabetes and you have trouble keeping your blood sugar in your target range. Get help right away if: You have chest pain. You have shortness of breath. You have vomiting or diarrhea that lasts for more than 2 days. You faint. These symptoms may be an emergency. Get help right away. Call 911. Do not wait to see if the symptoms will go away. Do not drive yourself to the hospital. Summary Hypokalemia means that the amount of potassium in the blood is lower than normal. This condition is diagnosed with a blood test. Hypokalemia may be treated by taking potassium supplements, adjusting the medicines that you take, or eating more foods that are high in potassium. If your potassium level is very low, you may need to get potassium through an IV and be monitored in the hospital. This information is not intended to replace advice given to you by your health care provider. Make sure you discuss any questions you have with your health care provider. Document Revised: 01/04/2021 Document Reviewed: 01/04/2021 Elsevier Patient Education  2024 [employer].  

## 2023-02-28 ENCOUNTER — Inpatient Hospital Stay: Payer: Medicaid Other

## 2023-02-28 DIAGNOSIS — D649 Anemia, unspecified: Secondary | ICD-10-CM

## 2023-02-28 DIAGNOSIS — C61 Malignant neoplasm of prostate: Secondary | ICD-10-CM | POA: Diagnosis not present

## 2023-02-28 MED ORDER — DIPHENHYDRAMINE HCL 25 MG PO CAPS
25.0000 mg | ORAL_CAPSULE | Freq: Once | ORAL | Status: AC
  Administered 2023-02-28: 25 mg via ORAL
  Filled 2023-02-28: qty 1

## 2023-02-28 MED ORDER — ACETAMINOPHEN 325 MG PO TABS
650.0000 mg | ORAL_TABLET | Freq: Once | ORAL | Status: AC
  Administered 2023-02-28: 650 mg via ORAL
  Filled 2023-02-28: qty 2

## 2023-02-28 MED ORDER — SODIUM CHLORIDE 0.9% IV SOLUTION
250.0000 mL | INTRAVENOUS | Status: DC
  Administered 2023-02-28: 250 mL via INTRAVENOUS
  Filled 2023-02-28: qty 250

## 2023-02-28 NOTE — Patient Instructions (Signed)

## 2023-03-01 LAB — BPAM RBC
Blood Product Expiration Date: 202411192359
ISSUE DATE / TIME: 202410250852
Unit Type and Rh: 5100

## 2023-03-01 LAB — TYPE AND SCREEN
ABO/RH(D): O POS
Antibody Screen: NEGATIVE
Unit division: 0

## 2023-03-03 NOTE — Written Directive (Cosign Needed)
  PLUVICTO  THERAPY   RADIOPHARMACEUTICAL: Lutetium 177 vipivotide tetraxetan (Pluvicto)     PRESCRIBED DOSE FOR ADMINISTRATION:  200 mCi   ROUTE OFADMINISTRATION:  IV   DIAGNOSIS:  Prostate cancer metastatic to bone    REFERRING PHYSICIAN: Dr. Cathie Hoops    TREATMENT #: 1    ADDITIONAL PHYSICIAN COMMENTS/NOTES:   AUTHORIZED USER SIGNATURE & TIME STAMP: Patriciaann Clan, MD   03/06/23    10:46 AM

## 2023-03-06 ENCOUNTER — Ambulatory Visit (HOSPITAL_COMMUNITY)
Admission: RE | Admit: 2023-03-06 | Discharge: 2023-03-06 | Disposition: A | Discharge status: Home / Self Care | Payer: Medicaid Other | Source: Ambulatory Visit | Attending: Oncology | Admitting: Oncology

## 2023-03-06 ENCOUNTER — Inpatient Hospital Stay (HOSPITAL_COMMUNITY): Admission: RE | Admit: 2023-03-06 | Payer: Medicaid Other | Source: Ambulatory Visit

## 2023-03-06 ENCOUNTER — Inpatient Hospital Stay: Payer: Medicaid Other

## 2023-03-06 VITALS — BP 108/66 | HR 86

## 2023-03-06 DIAGNOSIS — C61 Malignant neoplasm of prostate: Secondary | ICD-10-CM | POA: Insufficient documentation

## 2023-03-06 DIAGNOSIS — C7951 Secondary malignant neoplasm of bone: Secondary | ICD-10-CM | POA: Insufficient documentation

## 2023-03-06 LAB — CBC WITH DIFFERENTIAL/PLATELET
Abs Immature Granulocytes: 0.18 10*3/uL — ABNORMAL HIGH (ref 0.00–0.07)
Basophils Absolute: 0 10*3/uL (ref 0.0–0.1)
Basophils Relative: 0 %
Eosinophils Absolute: 0.1 10*3/uL (ref 0.0–0.5)
Eosinophils Relative: 1 %
HCT: 25.7 % — ABNORMAL LOW (ref 39.0–52.0)
Hemoglobin: 8.2 g/dL — ABNORMAL LOW (ref 13.0–17.0)
Immature Granulocytes: 1 %
Lymphocytes Relative: 22 %
Lymphs Abs: 3 10*3/uL (ref 0.7–4.0)
MCH: 25.2 pg — ABNORMAL LOW (ref 26.0–34.0)
MCHC: 31.9 g/dL (ref 30.0–36.0)
MCV: 79.1 fL — ABNORMAL LOW (ref 80.0–100.0)
Monocytes Absolute: 1.1 10*3/uL — ABNORMAL HIGH (ref 0.1–1.0)
Monocytes Relative: 8 %
Neutro Abs: 9 10*3/uL — ABNORMAL HIGH (ref 1.7–7.7)
Neutrophils Relative %: 68 %
Platelets: 393 10*3/uL (ref 150–400)
RBC: 3.25 MIL/uL — ABNORMAL LOW (ref 4.22–5.81)
RDW: 20.6 % — ABNORMAL HIGH (ref 11.5–15.5)
WBC: 13.3 10*3/uL — ABNORMAL HIGH (ref 4.0–10.5)
nRBC: 0 % (ref 0.0–0.2)

## 2023-03-06 LAB — COMPREHENSIVE METABOLIC PANEL
ALT: 9 U/L (ref 0–44)
AST: 17 U/L (ref 15–41)
Albumin: 2.6 g/dL — ABNORMAL LOW (ref 3.5–5.0)
Alkaline Phosphatase: 135 U/L — ABNORMAL HIGH (ref 38–126)
Anion gap: 12 (ref 5–15)
BUN: 7 mg/dL — ABNORMAL LOW (ref 8–23)
CO2: 20 mmol/L — ABNORMAL LOW (ref 22–32)
Calcium: 8.8 mg/dL — ABNORMAL LOW (ref 8.9–10.3)
Chloride: 97 mmol/L — ABNORMAL LOW (ref 98–111)
Creatinine, Ser: 0.71 mg/dL (ref 0.61–1.24)
GFR, Estimated: >60 mL/min (ref >60)
Glucose, Bld: 155 mg/dL — ABNORMAL HIGH (ref 70–99)
Potassium: 4.3 mmol/L (ref 3.5–5.1)
Sodium: 129 mmol/L — ABNORMAL LOW (ref 135–145)
Total Bilirubin: 0.7 mg/dL (ref 0.3–1.2)
Total Protein: 7.8 g/dL (ref 6.5–8.1)

## 2023-03-06 LAB — POCT I-STAT CREATININE: Creatinine, Ser: 0.7 mg/dL (ref 0.61–1.24)

## 2023-03-06 MED ORDER — LUTETIUM LU 177 VIPIVOTIDE TET 1000 MBQ/ML IV SOLN
204.9200 | Freq: Once | INTRAVENOUS | Status: AC
  Administered 2023-03-06: 204.92 via INTRAVENOUS

## 2023-03-06 MED ORDER — SODIUM CHLORIDE 0.9 % IV BOLUS: 1000.0000 mL | Freq: Once | INTRAVENOUS | Status: DC

## 2023-03-06 NOTE — Progress Notes (Signed)
Nutrition  Patient did not show up for nutrition visit today.  Will send message to scheduling to offer another visit.   Chalese Peach B. Freida Busman, RD, LDN Registered Dietitian [phone]

## 2023-03-06 NOTE — Progress Notes (Signed)
CLINICAL DATA: [62 year old male with metastatic prostate carcinoma.  Castrate resistant carcinoma.  Trial taxane chemotherapy.  Progression with bone metastasis.  PSA elevated 350.    Anemia noted prior to therapy.  Foley catheter in place.  Chronic urinary retention related to BPH in prostate cancer]  EXAM: NUCLEAR MEDICINE PLUVICTO INJECTION  TECHNIQUE: Infusion: The nuclear medicine technologist and I personally verified the dose activity to be delivered as specified in the written directive, and verified the patient identification via 2 separate methods.  Initial flush of the intravenous catheter was performed was sterile saline. The dose syringe was connected to the catheter and the Lu-177 Pluvicto administered over a 1 to 10 min infusion. Single 10 cc  lushes with normal saline follow the dose. No complications were noted. The entire IV tubing, venocatheter, stopcock and syringes was removed in total, placed in a disposal bag and sent for assay of the residual activity, which will be reported at a later time in our EMR by the physics staff. Pressure was applied to the venipuncture site, and a compression bandage placed. Patient monitored for 1 hour following infusion.    Radiation Safety personnel were present to perform the discharge survey, as detailed on their documentation. After a short period of observation, the patient had his IV removed.  RADIOPHARMACEUTICALS: [204.92] microcuries Lu-177 PLUVICTO  FINDINGS: Current Infusion: [1]  Planned Infusions: 6    Patient presented to nuclear medicine for treatment. The patient's most recent blood counts were reviewed and remains a adequate candidate to proceed with Lu-177 Pluvicto.   Patient  hemoglobin equal 8.2 increased from 7.7.  Recent transfusion.     Creatinine normal.  PSA equal 352      potassium is improved to 4.3 from 2.7.     The patient was situated in an infusion suite with a contact barrier placed under  the arm. Intravenous access was established, using sterile technique, and a normal saline infusion from a syringe was started.     IMPRESSION: Current Infusion: [1]  Planned Infusions: 6    [The patient tolerated the infusion well. The patient will return in 6 weeks for ongoing care.]

## 2023-03-07 ENCOUNTER — Inpatient Hospital Stay: Payer: Medicaid Other | Admitting: Nurse Practitioner

## 2023-03-07 ENCOUNTER — Inpatient Hospital Stay: Payer: Medicaid Other

## 2023-03-10 ENCOUNTER — Telehealth: Payer: Self-pay

## 2023-03-12 ENCOUNTER — Other Ambulatory Visit: Payer: Self-pay | Admitting: Oncology

## 2023-03-14 ENCOUNTER — Encounter: Payer: Self-pay | Admitting: Oncology

## 2023-03-14 ENCOUNTER — Inpatient Hospital Stay: Payer: Medicaid Other

## 2023-03-14 ENCOUNTER — Other Ambulatory Visit: Payer: Medicaid Other

## 2023-03-14 ENCOUNTER — Ambulatory Visit: Payer: Medicaid Other | Admitting: Oncology

## 2023-03-14 ENCOUNTER — Ambulatory Visit: Payer: Medicaid Other | Admitting: Hospice and Palliative Medicine

## 2023-03-14 ENCOUNTER — Ambulatory Visit: Payer: Medicaid Other

## 2023-03-14 ENCOUNTER — Inpatient Hospital Stay (HOSPITAL_BASED_OUTPATIENT_CLINIC_OR_DEPARTMENT_OTHER): Payer: Medicaid Other | Admitting: Hospice and Palliative Medicine

## 2023-03-14 ENCOUNTER — Inpatient Hospital Stay (HOSPITAL_BASED_OUTPATIENT_CLINIC_OR_DEPARTMENT_OTHER): Payer: Medicaid Other | Attending: Oncology | Admitting: Oncology

## 2023-03-14 ENCOUNTER — Inpatient Hospital Stay: Payer: Medicaid Other | Attending: Oncology

## 2023-03-14 VITALS — BP 100/63 | HR 114 | Temp 95.0°F | Resp 18 | Wt 113.8 lb

## 2023-03-14 VITALS — BP 106/67 | HR 90

## 2023-03-14 DIAGNOSIS — G893 Neoplasm related pain (acute) (chronic): Secondary | ICD-10-CM

## 2023-03-14 DIAGNOSIS — C61 Malignant neoplasm of prostate: Secondary | ICD-10-CM

## 2023-03-14 DIAGNOSIS — E876 Hypokalemia: Secondary | ICD-10-CM | POA: Diagnosis not present

## 2023-03-14 DIAGNOSIS — I10 Essential (primary) hypertension: Secondary | ICD-10-CM | POA: Insufficient documentation

## 2023-03-14 DIAGNOSIS — G4730 Sleep apnea, unspecified: Secondary | ICD-10-CM | POA: Insufficient documentation

## 2023-03-14 DIAGNOSIS — R1319 Other dysphagia: Secondary | ICD-10-CM

## 2023-03-14 DIAGNOSIS — Z7984 Long term (current) use of oral hypoglycemic drugs: Secondary | ICD-10-CM | POA: Insufficient documentation

## 2023-03-14 DIAGNOSIS — C7951 Secondary malignant neoplasm of bone: Secondary | ICD-10-CM

## 2023-03-14 DIAGNOSIS — R1310 Dysphagia, unspecified: Secondary | ICD-10-CM | POA: Insufficient documentation

## 2023-03-14 DIAGNOSIS — E43 Unspecified severe protein-calorie malnutrition: Secondary | ICD-10-CM | POA: Diagnosis not present

## 2023-03-14 DIAGNOSIS — Z79899 Other long term (current) drug therapy: Secondary | ICD-10-CM | POA: Insufficient documentation

## 2023-03-14 DIAGNOSIS — F17210 Nicotine dependence, cigarettes, uncomplicated: Secondary | ICD-10-CM | POA: Diagnosis not present

## 2023-03-14 DIAGNOSIS — M129 Arthropathy, unspecified: Secondary | ICD-10-CM | POA: Diagnosis not present

## 2023-03-14 DIAGNOSIS — E871 Hypo-osmolality and hyponatremia: Secondary | ICD-10-CM | POA: Insufficient documentation

## 2023-03-14 DIAGNOSIS — R918 Other nonspecific abnormal finding of lung field: Secondary | ICD-10-CM | POA: Diagnosis not present

## 2023-03-14 DIAGNOSIS — D649 Anemia, unspecified: Secondary | ICD-10-CM

## 2023-03-14 DIAGNOSIS — K8020 Calculus of gallbladder without cholecystitis without obstruction: Secondary | ICD-10-CM | POA: Insufficient documentation

## 2023-03-14 DIAGNOSIS — K5900 Constipation, unspecified: Secondary | ICD-10-CM | POA: Insufficient documentation

## 2023-03-14 DIAGNOSIS — I700 Atherosclerosis of aorta: Secondary | ICD-10-CM | POA: Insufficient documentation

## 2023-03-14 DIAGNOSIS — Z192 Hormone resistant malignancy status: Secondary | ICD-10-CM | POA: Diagnosis not present

## 2023-03-14 LAB — CBC WITH DIFFERENTIAL (CANCER CENTER ONLY)
Abs Immature Granulocytes: 0.35 10*3/uL — ABNORMAL HIGH (ref 0.00–0.07)
Basophils Absolute: 0 10*3/uL (ref 0.0–0.1)
Basophils Relative: 0 %
Eosinophils Absolute: 0 10*3/uL (ref 0.0–0.5)
Eosinophils Relative: 0 %
HCT: 29.6 % — ABNORMAL LOW (ref 39.0–52.0)
Hemoglobin: 9.1 g/dL — ABNORMAL LOW (ref 13.0–17.0)
Immature Granulocytes: 3 %
Lymphocytes Relative: 24 %
Lymphs Abs: 3.3 10*3/uL (ref 0.7–4.0)
MCH: 24.3 pg — ABNORMAL LOW (ref 26.0–34.0)
MCHC: 30.7 g/dL (ref 30.0–36.0)
MCV: 78.9 fL — ABNORMAL LOW (ref 80.0–100.0)
Monocytes Absolute: 1.1 10*3/uL — ABNORMAL HIGH (ref 0.1–1.0)
Monocytes Relative: 8 %
Neutro Abs: 9 10*3/uL — ABNORMAL HIGH (ref 1.7–7.7)
Neutrophils Relative %: 65 %
Platelet Count: 586 10*3/uL — ABNORMAL HIGH (ref 150–400)
RBC: 3.75 MIL/uL — ABNORMAL LOW (ref 4.22–5.81)
RDW: 19.9 % — ABNORMAL HIGH (ref 11.5–15.5)
WBC Count: 13.8 10*3/uL — ABNORMAL HIGH (ref 4.0–10.5)
nRBC: 0 % (ref 0.0–0.2)

## 2023-03-14 LAB — SAMPLE TO BLOOD BANK

## 2023-03-14 LAB — CMP (CANCER CENTER ONLY)
ALT: 10 U/L (ref 0–44)
AST: 25 U/L (ref 15–41)
Albumin: 3.1 g/dL — ABNORMAL LOW (ref 3.5–5.0)
Alkaline Phosphatase: 142 U/L — ABNORMAL HIGH (ref 38–126)
Anion gap: 15 (ref 5–15)
BUN: 13 mg/dL (ref 8–23)
CO2: 22 mmol/L (ref 22–32)
Calcium: 9.2 mg/dL (ref 8.9–10.3)
Chloride: 91 mmol/L — ABNORMAL LOW (ref 98–111)
Creatinine: 0.94 mg/dL (ref 0.61–1.24)
GFR, Estimated: >60 mL/min (ref >60)
Glucose, Bld: 198 mg/dL — ABNORMAL HIGH (ref 70–99)
Potassium: 3.7 mmol/L (ref 3.5–5.1)
Sodium: 128 mmol/L — ABNORMAL LOW (ref 135–145)
Total Bilirubin: 0.8 mg/dL (ref <1.2)
Total Protein: 9.1 g/dL — ABNORMAL HIGH (ref 6.5–8.1)

## 2023-03-14 LAB — RETIC PANEL
Immature Retic Fract: 40.5 % — ABNORMAL HIGH (ref 2.3–15.9)
RBC.: 3.53 MIL/uL — ABNORMAL LOW (ref 4.22–5.81)
Retic Count, Absolute: 40.6 10*3/uL (ref 19.0–186.0)
Retic Ct Pct: 1.2 % (ref 0.4–3.1)
Reticulocyte Hemoglobin: 23.7 pg — ABNORMAL LOW (ref >27.9)

## 2023-03-14 MED ORDER — NYSTATIN 100000 UNIT/ML MT SUSP: 5.0000 mL | Freq: Four times a day (QID) | OROMUCOSAL | 0 refills | Status: DC

## 2023-03-14 MED ORDER — SODIUM CHLORIDE 0.9 % IV SOLN
Freq: Once | INTRAVENOUS | Status: AC
  Filled 2023-03-14: qty 250

## 2023-03-14 MED ORDER — FAMOTIDINE IN NACL 20-0.9 MG/50ML-% IV SOLN
20.0000 mg | Freq: Once | INTRAVENOUS | Status: AC
Start: 2023-03-14 — End: 2023-03-14
  Administered 2023-03-14: 20 mg via INTRAVENOUS
  Filled 2023-03-14: qty 50

## 2023-03-14 MED ORDER — DEXAMETHASONE SODIUM PHOSPHATE 10 MG/ML IJ SOLN
10.0000 mg | Freq: Once | INTRAMUSCULAR | Status: AC
  Administered 2023-03-14: 10 mg via INTRAVENOUS
  Filled 2023-03-14: qty 1

## 2023-03-14 MED ORDER — PANTOPRAZOLE SODIUM 40 MG PO TBEC: 40.0000 mg | DELAYED_RELEASE_TABLET | Freq: Every day | ORAL | 1 refills | Status: DC

## 2023-03-14 MED ORDER — OXYCODONE HCL 5 MG PO TABS
5.0000 mg | ORAL_TABLET | Freq: Once | ORAL | Status: AC
  Administered 2023-03-14: 5 mg via ORAL
  Filled 2023-03-14: qty 1

## 2023-03-14 MED ORDER — POTASSIUM CHLORIDE CRYS ER 20 MEQ PO TBCR: 20.0000 meq | EXTENDED_RELEASE_TABLET | Freq: Two times a day (BID) | ORAL | Status: DC

## 2023-03-14 NOTE — Progress Notes (Signed)
Palliative Medicine [facility] at [facility] [phone] [phone]   Name: Louis Gardner Date: 03/14/2023 MRN: [id number]  DOB: August 22, 1960  Patient Care Team: [facility] as PCP - General Debbe Odea, MD as PCP - Cardiology (Cardiology) Rickard Patience, MD as Consulting Physician (Oncology)    REASON FOR CONSULTATION: Louis Gardner is a 62 y.o. 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  Patient was referred palliative care to address goals and manage ongoing symptoms.  SOCIAL HISTORY:     reports that he has been smoking cigarettes. He has a 12 pack-year smoking history. He has been exposed to tobacco smoke. He has never used smokeless tobacco. He reports current alcohol use of about 6.0 standard drinks of alcohol per week. He reports that he does not use drugs.  Patient is unmarried.  He has no children.  He lives at home with his girlfriend.  He previously worked in [employer].  ADVANCE DIRECTIVES:  Does not have  CODE STATUS:   PAST MEDICAL HISTORY: Past Medical History:  Diagnosis Date   Arthritis    Depression    GI bleeding    Hypertension    Pancreatic cancer (HCC)    Sleep apnea     PAST SURGICAL HISTORY:  Past Surgical History:  Procedure Laterality Date   COLONOSCOPY     COLONOSCOPY WITH PROPOFOL N/A 12/09/2022   Procedure: COLONOSCOPY WITH PROPOFOL;  Surgeon: Wyline Mood, MD;  Location: [facility] ENDOSCOPY;  Service: Gastroenterology;  Laterality: N/A;   ESOPHAGOGASTRODUODENOSCOPY (EGD) WITH PROPOFOL N/A 03/19/2016   Procedure: ESOPHAGOGASTRODUODENOSCOPY (EGD) WITH PROPOFOL;  Surgeon: Christena Deem, MD;  Location: [facility] ENDOSCOPY;  Service: Endoscopy;  Laterality: N/A;   IR BONE MARROW BIOPSY & ASPIRATION  01/24/2023   IR CT BONE TROCAR/NEEDLE BIOPSY DEEP  01/24/2023    HEMATOLOGY/ONCOLOGY HISTORY:  Oncology History   Prostate cancer metastatic to bone (HCC)  05/29/2021 Initial Diagnosis   Prostate cancer metastatic to bone (HCC)  03/19/21 - CT A/P with contrast performed to follow up on pancreatic mass, with near resolution of pancreatic uncinate process. Bulky multistation abdominopelvic lymphadenopathy identified (e.g. left para-aortic node measuring 2.1cm and right common iliac node measuring 1.4cm). No concerning osseous lesions identified. Enlarged, heterogeneously enhancing prostate gland. 04/17/21 - PSA 48.83 05/29/21 - retroperitoneal lymph node core needle biopsy, pathology consistent with metastatic prostate adenocarcinoma. 06/27/21 - PSMA-PET scan with "widely metastatic osseous disease as well as avid adenopathy extending from the pelvis to the left supraclavicular region consistent with metastatic prostate cancer. Noting that there is one single enlarged retroperitoneal node with low/minimal tracer avidity measuring up to 4 cm."  07/31/2022 PSA 406.39 08/27/21- 12/24/2021, 4 cycles. -docetaxel chemotherapy 75mg /m2 mid April 2023- darolutamide per ARASENS trial in mid April 2023  01/14/2022 PSMA showed marked interval improvement, tracer avid osseous disease now with only 6 lesions that demonstrating mild residual focal avidity  01/29/2023 PSA nadir  0.5 08/21/2022 PSA 3.32 09/25/2022, he developed castration resistant disease -Diffuse sclerotic lesions throughout the skeleton some of which demonstrate Pylarify uptake, as described in the body of the report, concerning for Pylarify avid metastatic disease. Uptake of the Pylarify avid lesions is overall similar to that of the liver.  -New uptake in the right posterior prostate and right seminal vesicle, concerning for malignancy.   Patient was recommended to stop darolutamide.  He was offered Pluvicto versus salvage chemotherapy with cabazitaxel.  He was undecided.  10/28/2022 PSA 18.92.         11/23/2022 Tumor Marker   PSA 117.5   11/23/2022  Imaging   Patient presented emergency room for evaluation of chest pain for 2 days. CT chest angiogram PE protocol 1. No evidence for acute pulmonary embolus. 2. Diffusely abnormal bones with multifocal areas of abnormal increased sclerosis noted throughout the thoracic spine, sternum and ribs concerning for diffuse osseous metastasis. 3. Prominent mediastinal and hilar lymph nodes are identified. These are nonspecific and may be reactive. Nodal metastasis not excluded. 4. Focal area of bronchiectasis with adjacent peripheral tree-in-bud nodularity in the right upper lobe. This is favored to represent sequelae of prior infection or inflammation. 5. Bilateral adrenal gland hyperplasia. 6. Cholelithiasis. 7. Irregular subpleural nodule in the anterior left upper lobe measures 4 mm. 8.  Aortic Atherosclerosis   11/27/2022 Cancer Staging   Staging form: Prostate, AJCC 8th Edition - Pathologic: Stage IVB (pT2, pN1, cM1, PSA: 48) - Signed by Rickard Patience, MD on 11/27/2022 Stage prefix: Initial diagnosis Prostate specific antigen (PSA) range: 20 or greater     ALLERGIES:  has No Known Allergies.  MEDICATIONS:  Current Outpatient Medications  Medication Sig Dispense Refill   amLODipine (NORVASC) 10 MG tablet Take 10 mg by mouth daily.     bisacodyl (DULCOLAX) 10 MG suppository Place 1 suppository (10 mg total) rectally daily as needed for up to 12 doses for moderate constipation. 12 suppository 0   ciprofloxacin (CIPRO) 500 MG tablet Take by mouth.     feeding supplement (ENSURE ENLIVE / ENSURE PLUS) LIQD Take 237 mLs by mouth 3 (three) times daily between meals. 237 mL 12   JARDIANCE 10 MG TABS tablet Take 10 mg by mouth daily.     magnesium hydroxide (MILK OF MAGNESIA) 400 MG/5ML suspension Take 5 mLs by mouth daily as needed for mild constipation.     megestrol (MEGACE) 40 MG tablet Take 2 tablets (80 mg total) by mouth 2 (two) times daily. 120 tablet 0   metFORMIN (GLUCOPHAGE) 500 MG tablet  Take 1,000 mg by mouth 2 (two) times daily with a meal.     nystatin (MYCOSTATIN) 100000 UNIT/ML suspension Use as directed 5 mLs (500,000 Units total) in the mouth or throat 4 (four) times daily. 473 mL 0   ondansetron (ZOFRAN-ODT) 8 MG disintegrating tablet Take 1 tablet (8 mg total) by mouth every 8 (eight) hours as needed for nausea. 60 tablet 1   oxyCODONE (ROXICODONE) 5 MG immediate release tablet Take 1 tablet (5 mg total) by mouth every 6 (six) hours as needed for breakthrough pain or severe pain. 90 tablet 0   pantoprazole (PROTONIX) 40 MG tablet Take 1 tablet (40 mg total) by mouth daily. 90 tablet 1   polyethylene glycol (MIRALAX / GLYCOLAX) 17 g packet Take 17 g by mouth daily.     potassium chloride SA (KLOR-CON M) 20 MEQ tablet Take 1 tablet (20 mEq total) by mouth 2 (two) times daily.     pravastatin (PRAVACHOL) 20 MG tablet Take 20 mg by mouth daily.     prochlorperazine (COMPAZINE) 10 MG tablet Take 1 tablet (10 mg total) by mouth every 6 (six) hours as needed for nausea or vomiting. 90 tablet 0   senna-docusate (SENOKOT-S) 8.6-50 MG tablet Take 1 tablet by mouth daily as needed for up to 30 doses for mild constipation. 30 tablet 0   tamsulosin (FLOMAX) 0.4 MG CAPS capsule Take 1 capsule by mouth daily.  No current facility-administered medications for this visit.    VITAL SIGNS: There were no vitals taken for this visit. There were no vitals filed for this visit.  Estimated body mass index is 16.81 kg/m as calculated from the following:   Height as of 02/27/23: 5\' 9"  (1.753 m).   Weight as of an earlier encounter on 03/14/23: 113 lb 12.8 oz (51.6 kg).  LABS: CBC:    Component Value Date/Time   WBC 13.8 (H) 03/14/2023 0910   WBC 13.3 (H) 03/06/2023 1408   HGB 9.1 (L) 03/14/2023 0910   HGB 11.4 (L) 09/08/2013 1436   HCT 29.6 (L) 03/14/2023 0910   HCT 36.3 (L) 09/08/2013 1436   PLT 586 (H) 03/14/2023 0910   PLT 315 09/08/2013 1436   MCV 78.9 (L) 03/14/2023 0910    MCV 83 09/08/2013 1436   NEUTROABS 9.0 (H) 03/14/2023 0910   NEUTROABS 1.8 08/05/2013 0616   LYMPHSABS 3.3 03/14/2023 0910   LYMPHSABS 1.6 08/05/2013 0616   MONOABS 1.1 (H) 03/14/2023 0910   MONOABS 0.8 08/05/2013 0616   EOSABS 0.0 03/14/2023 0910   EOSABS 0.1 08/05/2013 0616   BASOSABS 0.0 03/14/2023 0910   BASOSABS 1 09/08/2013 1436   BASOSABS 0.0 08/05/2013 0616   Comprehensive Metabolic Panel:    Component Value Date/Time   NA 128 (L) 03/14/2023 0901   NA 138 08/04/2013 0842   K 3.7 03/14/2023 0901   K 3.9 08/04/2013 0842   CL 91 (L) 03/14/2023 0901   CL 106 08/04/2013 0842   CO2 22 03/14/2023 0901   CO2 28 08/04/2013 0842   BUN 13 03/14/2023 0901   BUN 5 (L) 08/04/2013 0842   CREATININE 0.94 03/14/2023 0901   CREATININE 1.32 (H) 08/04/2013 0842   GLUCOSE 198 (H) 03/14/2023 0901   GLUCOSE 116 (H) 08/04/2013 0842   CALCIUM 9.2 03/14/2023 0901   CALCIUM 8.4 (L) 08/04/2013 0842   AST 25 03/14/2023 0901   ALT 10 03/14/2023 0901   ALT 58 08/02/2013 1135   ALKPHOS 142 (H) 03/14/2023 0901   ALKPHOS 62 08/02/2013 1135   BILITOT 0.8 03/14/2023 0901   PROT 9.1 (H) 03/14/2023 0901   PROT 8.8 (H) 08/02/2013 1135   ALBUMIN 3.1 (L) 03/14/2023 0901   ALBUMIN 4.2 08/02/2013 1135    RADIOGRAPHIC STUDIES: NM PLUVICTO ADMINISTRATION  Result Date: 03/06/2023 CLINICAL DATA:  62 year old male with metastatic prostate carcinoma. Castrate resistant carcinoma. Trial taxane chemotherapy. Progression with bone metastasis. PSA elevated 350. Anemia noted prior to therapy. Foley catheter in place. 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 Initial flush of the intravenous catheter was performed was sterile saline. The dose  syringe was connected to the catheter and the Lu-177 Pluvicto administered over a 1 to 10 min infusion. Single 10 cc lushes with normal saline follow the dose. No complications were noted. The entire IV tubing, venocatheter, stopcock and syringes was removed in total, placed in a disposal bag and sent for assay of the residual activity, which will be reported at a later time in our EMR by the physics staff. Pressure was applied to the venipuncture site, and a compression bandage placed. Patient monitored for 1 hour following infusion. Radiation Safety personnel were present to perform the discharge survey, as detailed on their documentation. After  a short period of observation, the patient had his IV removed. RADIOPHARMACEUTICALS:  204.92 microcuries Lu-177 PLUVICTO FINDINGS: Current Infusion: 1 Planned Infusions: 6 Patient presented to nuclear medicine for treatment. The patient's most recent blood counts were reviewed and remains a adequate candidate to proceed with Lu-177 Pluvicto. Patient hemoglobin equal 8.2 increased from 7.7. Recent transfusion. Creatinine normal.  PSA equal 352 potassium is improved to 4.3 from 2.7. The patient was situated in an infusion suite with a contact barrier placed under the arm. Intravenous access was established, using sterile technique, and a normal saline infusion from a syringe was started. Micro-dosimetry: The prescribed radiation activity was assayed and confirmed to be within specified tolerance. IMPRESSION: Current Infusion: 1 Planned Infusions: 6 The patient tolerated the infusion well. The patient will return in 6 weeks for ongoing care. Electronically Signed   By: Genevive Bi M.D.   On: 03/06/2023 16:39    PERFORMANCE STATUS (ECOG) : 1 - Symptomatic but completely ambulatory  Review of Systems Unless otherwise noted, a complete review of systems is negative.  Physical Exam General: NAD Pulmonary: Unlabored Extremities: no edema, no joint deformities Skin:  no rashes Neurological: Weakness but otherwise nonfocal  IMPRESSION: Patient was an add-on my clinic schedule today at Dr. Bethanne Ginger request.  Patient has initiated Pluvicto.  Overall, he feels poorly.  Having pain this morning and has not taken his oxycodone.  Oral intake is poor.  With patient and wife state that they remain in agreement with current scope of treatment.  However, patient says that he has strong faith in God and does not "fear death."  Discussed CODE STATUS and patient sent home with a MOST form to review.  Patient says that he does not think that he would want to be resuscitated at end-of-life but agreed to speak with family regarding decision making.  Patient was tearful appearing today.  He agreed to referral to [facility] care.  PLAN: -Continue current scope of treatment -Continue oxycodone as needed for pain (will give dose in clinic today) -Daily bowel regimen with MiraLAX/Senokot -MOST form reviewed -Referral to [facility] -RTC 2 to 3 weeks   Patient expressed understanding and was in agreement with this plan. He also understands that He can call the clinic at any time with any questions, concerns, or complaints.     Time Total: 20 minutes  Visit consisted of counseling and education dealing with the complex and emotionally intense issues of symptom management and palliative care in the setting of serious and potentially life-threatening illness.Greater than 50%  of this time was spent counseling and coordinating care related to the above assessment and plan.  Signed by: Laurette Schimke, PhD, NP-C

## 2023-03-14 NOTE — Assessment & Plan Note (Signed)
Continue oxycodone 5mg  Q6h-8h PRN

## 2023-03-14 NOTE — Assessment & Plan Note (Signed)
He was seen by GI during recent admission, was felt to be due to dysmotility.  Continue PPI.  Follow up with GI outpatient for EGD IVF normal saline 1 L today

## 2023-03-14 NOTE — Assessment & Plan Note (Signed)
Previous oncology history, treatment records were reviewed. He has history of metastatic prostate cancer with lymph node and bone involvement.  Currently on androgen deprivation therapy with the last dose of Eligard on 10/28/2022 Status post first-line treatment with docetaxel every 3 weeks x 4 cycles, as well as Daralutamide.  He has now developed castration resistant disease. Started on Pluvicto on 03/06/23 with Dr. Amil Amen Continue  Megace 80mg  BID

## 2023-03-14 NOTE — Assessment & Plan Note (Signed)
Acute on chronic, dated back to 2021.  Hydration session weekly

## 2023-03-14 NOTE — Assessment & Plan Note (Signed)
Refer to dietitian.  Recommend nutrition supplementation.  Poor oral intake, he will get 1L of NS, IV Dex 10mg 

## 2023-03-14 NOTE — Assessment & Plan Note (Signed)
Multifactorial, largely due to bone marrow prostate cancer involvement.  Decreased reticulocyte hemoglobin, got empiric IV Venofer.  Today his hemoglobin has improved.

## 2023-03-14 NOTE — Progress Notes (Signed)
Hematology/Oncology Consult Note [phone] [phone]    CHIEF COMPLAINTS/PURPOSE OF CONSULTATION:  metastatic prostate cancer  ASSESSMENT & PLAN:   Prostate cancer metastatic to bone [facility]) Previous oncology history, treatment records were reviewed. He has history of metastatic prostate cancer with lymph node and bone involvement.  Currently on androgen deprivation therapy with the last dose of Eligard on 10/28/2022 Status post first-line treatment with docetaxel every 3 weeks x 4 cycles, as well as Daralutamide.  He has now developed castration resistant disease. Started on Pluvicto on 03/06/23 with Dr. Amil Amen Continue  Megace 80mg  BID    Normocytic anemia Multifactorial, largely due to bone marrow prostate cancer involvement.  Decreased reticulocyte hemoglobin, got empiric IV Venofer.  Today his hemoglobin has improved.   Dysphagia He was seen by GI during recent admission, was felt to be due to dysmotility.  Continue PPI.  Follow up with GI outpatient for EGD IVF normal saline 1 L today  Hyponatremia Acute on chronic, dated back to 2021.  Hydration session weekly  Protein-calorie malnutrition, severe (HCC) Refer to dietitian.  Recommend nutrition supplementation.  Poor oral intake, he will get 1L of NS, IV Dex 10mg    Neoplasm related pain Continue oxycodone 5mg  Q6h-8h PRN   Orders Placed This Encounter  Procedures   CBC with Differential (Cancer Center Only)    Standing Status:   Future    Standing Expiration Date:   03/13/2024   CMP (Cancer Center only)    Standing Status:   Future    Standing Expiration Date:   03/13/2024   CMP (Cancer Center only)    Standing Status:   Future    Standing Expiration Date:   03/13/2024   CBC with Differential (Cancer Center Only)    Standing Status:   Future    Standing Expiration Date:   03/13/2024   Sample to Blood Bank    Standing Status:   Future    Standing Expiration Date:   03/13/2024   Sample  to Blood Bank    Standing Status:   Future    Standing Expiration Date:   03/13/2024   Follow-up los All questions were answered. The patient knows to call the clinic with any problems, questions or concerns.  Rickard Patience, MD, PhD [facility] Hematology Oncology 03/14/2023    HISTORY OF PRESENTING ILLNESS:  Louis Gardner 62 y.o. male presents to establish care for metastatic prostate cancer I have reviewed his chart and materials related to his cancer extensively and collaborated history with the patient. Summary of oncologic history is as follows: Oncology History  Prostate cancer metastatic to bone [facility])  05/29/2021 Initial Diagnosis   Prostate cancer metastatic to bone (HCC)  03/19/21 - CT A/P with contrast performed to follow up on pancreatic mass, with near resolution of pancreatic uncinate process. Bulky multistation abdominopelvic lymphadenopathy identified (e.g. left para-aortic node measuring 2.1cm and right common iliac node measuring 1.4cm). No concerning osseous lesions identified. Enlarged, heterogeneously enhancing prostate gland. 04/17/21 - PSA 48.83 05/29/21 - retroperitoneal lymph node core needle biopsy, pathology consistent with metastatic prostate adenocarcinoma. 06/27/21 - PSMA-PET scan with "widely metastatic osseous disease as well as avid adenopathy extending from the pelvis to the left supraclavicular region consistent with metastatic prostate cancer. Noting that there is one single enlarged retroperitoneal node with low/minimal tracer avidity measuring up to 4 cm."  07/31/2022 PSA 406.39 08/27/21- 12/24/2021, 4 cycles. -docetaxel chemotherapy 75mg /m2 mid April 2023- darolutamide per ARASENS trial in mid April 2023  01/14/2022 PSMA  showed marked interval improvement, tracer avid osseous disease now with only 6 lesions that demonstrating mild residual focal avidity  01/29/2023 PSA nadir  0.5 08/21/2022 PSA 3.32 09/25/2022, he developed castration resistant disease -Diffuse  sclerotic lesions throughout the skeleton some of which demonstrate Pylarify uptake, as described in the body of the report, concerning for Pylarify avid metastatic disease. Uptake of the Pylarify avid lesions is overall similar to that of the liver.  -New uptake in the right posterior prostate and right seminal vesicle, concerning for malignancy.   Patient was recommended to stop darolutamide.  He was offered Pluvicto versus salvage chemotherapy with cabazitaxel.  He was undecided. 10/28/2022 PSA 18.92.         11/23/2022 Tumor Marker   PSA 117.5   11/23/2022 Imaging   Patient presented emergency room for evaluation of chest pain for 2 days. CT chest angiogram PE protocol 1. No evidence for acute pulmonary embolus. 2. Diffusely abnormal bones with multifocal areas of abnormal increased sclerosis noted throughout the thoracic spine, sternum and ribs concerning for diffuse osseous metastasis. 3. Prominent mediastinal and hilar lymph nodes are identified. These are nonspecific and may be reactive. Nodal metastasis not excluded. 4. Focal area of bronchiectasis with adjacent peripheral tree-in-bud nodularity in the right upper lobe. This is favored to represent sequelae of prior infection or inflammation. 5. Bilateral adrenal gland hyperplasia. 6. Cholelithiasis. 7. Irregular subpleural nodule in the anterior left upper lobe measures 4 mm. 8.  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  Today patient was accompanied by his partner.  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  He has started Megace 80mg  BID. Appetite is slightly better S/P 1 pleuvicto treatment    MEDICAL  HISTORY:  Past Medical History:  Diagnosis Date   Arthritis    Depression    GI bleeding    Hypertension    Pancreatic cancer (HCC)    Sleep apnea     SURGICAL HISTORY: Past Surgical History:  Procedure Laterality Date   COLONOSCOPY     COLONOSCOPY WITH PROPOFOL N/A 12/09/2022   Procedure: COLONOSCOPY WITH PROPOFOL;  Surgeon: Wyline Mood, MD;  Location[facility] ENDOSCOPY;  Service: Gastroenterology;  Laterality: N/A;   ESOPHAGOGASTRODUODENOSCOPY (EGD) WITH PROPOFOL N/A 03/19/2016   Procedure: ESOPHAGOGASTRODUODENOSCOPY (EGD) WITH PROPOFOL;  Surgeon: Christena Deem, MD;  Location: [facility] ENDOSCOPY;  Service: Endoscopy;  Laterality: N/A;   IR BONE MARROW BIOPSY & ASPIRATION  01/24/2023   IR CT BONE TROCAR/NEEDLE BIOPSY DEEP  01/24/2023    SOCIAL HISTORY: Social History   Socioeconomic History   Marital status: Single    Spouse name: Not on file   Number of children: Not on file   Years of education: Not on file   Highest education level: Not on file  Occupational History   Not on file  Tobacco Use   Smoking status: Every Day    Current packs/day: 0.25    Average packs/day: 0.3 packs/day for 48.0 years (12.0 ttl pk-yrs)    Types: Cigarettes    Passive exposure: Current   Smokeless tobacco: Never  Vaping Use   Vaping status: Never Used  Substance and Sexual Activity   Alcohol use: Yes    Alcohol/week: 6.0  standard drinks of alcohol    Types: 6 Cans of beer per week   Drug use: No   Sexual activity: Not on file  Other Topics Concern   Not on file  Social History Narrative   Not on file   Social Determinants of Health   Financial Resource Strain: Low Risk  (11/27/2022)   Overall Financial Resource Strain (CARDIA)    Difficulty of Paying Living Expenses: Not very hard  Food Insecurity: No Food Insecurity (01/18/2023)   Hunger Vital Sign    Worried About Running Out of Food in the Last Year: Never true    Ran Out of Food in the Last Year: Never true  Recent Concern:  Food Insecurity - Food Insecurity Present (11/27/2022)   Hunger Vital Sign    Worried About Running Out of Food in the Last Year: Sometimes true    Ran Out of Food in the Last Year: Never true  Transportation Needs: No Transportation Needs (01/18/2023)   PRAPARE - Administrator, Civil Service (Medical): No    Lack of Transportation (Non-Medical): No  Physical Activity: Not on file  Stress: No Stress Concern Present (11/27/2022)   [employer] of Occupational Health - Occupational Stress Questionnaire    Feeling of Stress : Only a little  Social Connections: Not on file  Intimate Partner Violence: Not At Risk (01/18/2023)   Humiliation, Afraid, Rape, and Kick questionnaire    Fear of Current or Ex-Partner: No    Emotionally Abused: No    Physically Abused: No    Sexually Abused: No    FAMILY HISTORY: History reviewed. No pertinent family history.  ALLERGIES:  has No Known Allergies.  MEDICATIONS:  Current Outpatient Medications  Medication Sig Dispense Refill   amLODipine (NORVASC) 10 MG tablet Take 10 mg by mouth daily.     bisacodyl (DULCOLAX) 10 MG suppository Place 1 suppository (10 mg total) rectally daily as needed for up to 12 doses for moderate constipation. 12 suppository 0   ciprofloxacin (CIPRO) 500 MG tablet Take by mouth.     feeding supplement (ENSURE ENLIVE / ENSURE PLUS) LIQD Take 237 mLs by mouth 3 (three) times daily between meals. 237 mL 12   JARDIANCE 10 MG TABS tablet Take 10 mg by mouth daily.     magnesium hydroxide (MILK OF MAGNESIA) 400 MG/5ML suspension Take 5 mLs by mouth daily as needed for mild constipation.     megestrol (MEGACE) 40 MG tablet Take 2 tablets (80 mg total) by mouth 2 (two) times daily. 120 tablet 0   metFORMIN (GLUCOPHAGE) 500 MG tablet Take 1,000 mg by mouth 2 (two) times daily with a meal.     nystatin (MYCOSTATIN) 100000 UNIT/ML suspension Use as directed 5 mLs (500,000 Units total) in the mouth or throat 4 (four) times  daily. 473 mL 0   ondansetron (ZOFRAN-ODT) 8 MG disintegrating tablet Take 1 tablet (8 mg total) by mouth every 8 (eight) hours as needed for nausea. 60 tablet 1   oxyCODONE (ROXICODONE) 5 MG immediate release tablet Take 1 tablet (5 mg total) by mouth every 6 (six) hours as needed for breakthrough pain or severe pain. 90 tablet 0   polyethylene glycol (MIRALAX / GLYCOLAX) 17 g packet Take 17 g by mouth daily.     pravastatin (PRAVACHOL) 20 MG tablet Take 20 mg by mouth daily.     prochlorperazine (COMPAZINE) 10 MG tablet Take 1 tablet (10 mg total) by mouth every 6 (six) hours  as needed for nausea or vomiting. 90 tablet 0   senna-docusate (SENOKOT-S) 8.6-50 MG tablet Take 1 tablet by mouth daily as needed for up to 30 doses for mild constipation. 30 tablet 0   tamsulosin (FLOMAX) 0.4 MG CAPS capsule Take 1 capsule by mouth daily.     pantoprazole (PROTONIX) 40 MG tablet Take 1 tablet (40 mg total) by mouth daily. 90 tablet 1   potassium chloride SA (KLOR-CON M) 20 MEQ tablet Take 1 tablet (20 mEq total) by mouth 2 (two) times daily.     No current facility-administered medications for this visit.    Review of Systems  Constitutional:  Negative for appetite change, chills, fatigue, fever and unexpected weight change.  HENT:   Negative for hearing loss and voice change.   Eyes:  Negative for eye problems and icterus.  Respiratory:  Negative for chest tightness, cough and shortness of breath.   Cardiovascular:  Negative for chest pain and leg swelling.  Gastrointestinal:  Negative for abdominal distention and abdominal pain.  Endocrine: Negative for hot flashes.  Genitourinary:  Negative for difficulty urinating, dysuria and frequency.        Foley catheter  Musculoskeletal:  Negative for arthralgias.  Skin:  Negative for itching and rash.  Neurological:  Negative for light-headedness and numbness.  Hematological:  Negative for adenopathy. Does not bruise/bleed easily.   Psychiatric/Behavioral:  Negative for confusion.      PHYSICAL EXAMINATION: ECOG PERFORMANCE STATUS: 1 - Symptomatic but completely ambulatory  Vitals:   03/14/23 0934  BP: 100/63  Pulse: (!) 114  Resp: 18  Temp: (!) 95 F (35 C)  SpO2: 97%   Filed Weights   03/14/23 0934  Weight: 113 lb 12.8 oz (51.6 kg)    Physical Exam   LABORATORY DATA:  I have reviewed the data as listed    Latest Ref Rng & Units 03/14/2023    9:10 AM 03/06/2023    2:08 PM 02/27/2023    9:50 AM  CBC  WBC 4.0 - 10.5 K/uL 13.8  13.3  12.8   Hemoglobin 13.0 - 17.0 g/dL 9.1  8.2  7.7   Hematocrit 39.0 - 52.0 % 29.6  25.7  24.7   Platelets 150 - 400 K/uL 586  393  456       Latest Ref Rng & Units 03/14/2023    9:01 AM 03/06/2023    2:09 PM 03/06/2023    2:08 PM  CMP  Glucose 70 - 99 mg/dL 130   865   BUN 8 - 23 mg/dL 13   7   Creatinine 7.84 - 1.24 mg/dL 6.96  2.95  2.84   Sodium 135 - 145 mmol/L 128   129   Potassium 3.5 - 5.1 mmol/L 3.7   4.3   Chloride 98 - 111 mmol/L 91   97   CO2 22 - 32 mmol/L 22   20   Calcium 8.9 - 10.3 mg/dL 9.2   8.8   Total Protein 6.5 - 8.1 g/dL 9.1   7.8   Total Bilirubin <1.2 mg/dL 0.8   0.7   Alkaline Phos 38 - 126 U/L 142   135   AST 15 - 41 U/L 25   17   ALT 0 - 44 U/L 10   9      RADIOGRAPHIC STUDIES: I have personally reviewed the radiological images as listed and agreed with the findings in the report. NM PLUVICTO ADMINISTRATION  Result Date: 03/06/2023 CLINICAL DATA:  62 year old male with metastatic prostate carcinoma. Castrate resistant carcinoma. Trial taxane chemotherapy. Progression with bone metastasis. PSA elevated 350. Anemia noted prior to therapy. Foley catheter in place. Chronic urinary retention related to BPH in prostate cancer EXAM: NUCLEAR MEDICINE PLUVICTO INJECTION TECHNIQUE: Infusion: The nuclear medicine technologist and I personally verified the dose activity to be delivered as specified in the written directive, and verified the  patient identification via 2 separate methods. Initial flush of the intravenous catheter was performed was sterile saline. The dose syringe was connected to the catheter and the Lu-177 Pluvicto administered over a 1 to 10 min infusion. Single 10 cc lushes with normal saline follow the dose. No complications were noted. The entire IV tubing, venocatheter, stopcock and syringes was removed in total, placed in a disposal bag and sent for assay of the residual activity, which will be reported at a later time in our EMR by the physics staff. Pressure was applied to the venipuncture site, and a compression bandage placed. Patient monitored for 1 hour following infusion. Radiation Safety personnel were present to perform the discharge survey, as detailed on their documentation. After a short period of observation, the patient had his IV removed. RADIOPHARMACEUTICALS:  204.92 microcuries Lu-177 PLUVICTO FINDINGS: Current Infusion: 1 Planned Infusions: 6 Patient presented to nuclear medicine for treatment. The patient's most recent blood counts were reviewed and remains a adequate candidate to proceed with Lu-177 Pluvicto. Patient hemoglobin equal 8.2 increased from 7.7. Recent transfusion. Creatinine normal.  PSA equal 352 potassium is improved to 4.3 from 2.7. The patient was situated in an infusion suite with a contact barrier placed under the arm. Intravenous access was established, using sterile technique, and a normal saline infusion from a syringe was started. Micro-dosimetry: The prescribed radiation activity was assayed and confirmed to be within specified tolerance. IMPRESSION: Current Infusion: 1 Planned Infusions: 6 The patient tolerated the infusion well. The patient will return in 6 weeks for ongoing care. Electronically Signed   By: Genevive Bi M.D.   On: 03/06/2023 16:39

## 2023-03-17 ENCOUNTER — Emergency Department (HOSPITAL_COMMUNITY): Payer: Medicaid Other

## 2023-03-17 ENCOUNTER — Encounter: Payer: Self-pay | Admitting: Oncology

## 2023-03-17 ENCOUNTER — Emergency Department (HOSPITAL_COMMUNITY)
Admission: EM | Admit: 2023-03-17 | Discharge: 2023-03-17 | Disposition: A | Discharge status: Home / Self Care | Payer: Medicaid Other | Attending: Emergency Medicine | Admitting: Emergency Medicine

## 2023-03-17 ENCOUNTER — Other Ambulatory Visit: Payer: Self-pay

## 2023-03-17 ENCOUNTER — Encounter (HOSPITAL_COMMUNITY): Payer: Self-pay

## 2023-03-17 DIAGNOSIS — Z7984 Long term (current) use of oral hypoglycemic drugs: Secondary | ICD-10-CM | POA: Insufficient documentation

## 2023-03-17 DIAGNOSIS — D72829 Elevated white blood cell count, unspecified: Secondary | ICD-10-CM | POA: Insufficient documentation

## 2023-03-17 DIAGNOSIS — K5900 Constipation, unspecified: Secondary | ICD-10-CM | POA: Diagnosis not present

## 2023-03-17 DIAGNOSIS — R110 Nausea: Secondary | ICD-10-CM | POA: Diagnosis present

## 2023-03-17 DIAGNOSIS — E871 Hypo-osmolality and hyponatremia: Secondary | ICD-10-CM | POA: Diagnosis not present

## 2023-03-17 DIAGNOSIS — R634 Abnormal weight loss: Secondary | ICD-10-CM | POA: Diagnosis not present

## 2023-03-17 DIAGNOSIS — R1310 Dysphagia, unspecified: Secondary | ICD-10-CM | POA: Diagnosis not present

## 2023-03-17 DIAGNOSIS — Z8546 Personal history of malignant neoplasm of prostate: Secondary | ICD-10-CM | POA: Diagnosis not present

## 2023-03-17 DIAGNOSIS — Z79899 Other long term (current) drug therapy: Secondary | ICD-10-CM | POA: Diagnosis not present

## 2023-03-17 LAB — URINALYSIS, ROUTINE W REFLEX MICROSCOPIC
Bilirubin Urine: NEGATIVE
Glucose, UA: NEGATIVE mg/dL
Ketones, ur: NEGATIVE mg/dL
Nitrite: POSITIVE — AB
Protein, ur: 100 mg/dL — AB
RBC / HPF: >50 RBC/hpf (ref 0–5)
Specific Gravity, Urine: 1.018 (ref 1.005–1.030)
WBC, UA: >50 WBC/hpf (ref 0–5)
pH: 6 (ref 5.0–8.0)

## 2023-03-17 LAB — COMPREHENSIVE METABOLIC PANEL
ALT: 13 U/L (ref 0–44)
AST: 26 U/L (ref 15–41)
Albumin: 2.8 g/dL — ABNORMAL LOW (ref 3.5–5.0)
Alkaline Phosphatase: 150 U/L — ABNORMAL HIGH (ref 38–126)
Anion gap: 14 (ref 5–15)
BUN: 15 mg/dL (ref 8–23)
CO2: 21 mmol/L — ABNORMAL LOW (ref 22–32)
Calcium: 8.9 mg/dL (ref 8.9–10.3)
Chloride: 90 mmol/L — ABNORMAL LOW (ref 98–111)
Creatinine, Ser: 0.92 mg/dL (ref 0.61–1.24)
GFR, Estimated: >60 mL/min (ref >60)
Glucose, Bld: 275 mg/dL — ABNORMAL HIGH (ref 70–99)
Potassium: 3.4 mmol/L — ABNORMAL LOW (ref 3.5–5.1)
Sodium: 125 mmol/L — ABNORMAL LOW (ref 135–145)
Total Bilirubin: 0.8 mg/dL (ref <1.2)
Total Protein: 7.9 g/dL (ref 6.5–8.1)

## 2023-03-17 LAB — CBC WITH DIFFERENTIAL/PLATELET
Abs Immature Granulocytes: 0.78 10*3/uL — ABNORMAL HIGH (ref 0.00–0.07)
Basophils Absolute: 0 10*3/uL (ref 0.0–0.1)
Basophils Relative: 0 %
Eosinophils Absolute: 0 10*3/uL (ref 0.0–0.5)
Eosinophils Relative: 0 %
HCT: 26.1 % — ABNORMAL LOW (ref 39.0–52.0)
Hemoglobin: 8.3 g/dL — ABNORMAL LOW (ref 13.0–17.0)
Immature Granulocytes: 5 %
Lymphocytes Relative: 10 %
Lymphs Abs: 1.6 10*3/uL (ref 0.7–4.0)
MCH: 25 pg — ABNORMAL LOW (ref 26.0–34.0)
MCHC: 31.8 g/dL (ref 30.0–36.0)
MCV: 78.6 fL — ABNORMAL LOW (ref 80.0–100.0)
Monocytes Absolute: 1.5 10*3/uL — ABNORMAL HIGH (ref 0.1–1.0)
Monocytes Relative: 9 %
Neutro Abs: 12.3 10*3/uL — ABNORMAL HIGH (ref 1.7–7.7)
Neutrophils Relative %: 76 %
Platelets: 486 10*3/uL — ABNORMAL HIGH (ref 150–400)
RBC: 3.32 MIL/uL — ABNORMAL LOW (ref 4.22–5.81)
RDW: 20 % — ABNORMAL HIGH (ref 11.5–15.5)
WBC: 16.1 10*3/uL — ABNORMAL HIGH (ref 4.0–10.5)
nRBC: 0 % (ref 0.0–0.2)

## 2023-03-17 MED ORDER — SODIUM CHLORIDE 0.9 % IV BOLUS
1000.0000 mL | Freq: Once | INTRAVENOUS | Status: AC
  Administered 2023-03-17: 1000 mL via INTRAVENOUS

## 2023-03-17 MED ORDER — CEPHALEXIN 500 MG PO CAPS: 500.0000 mg | ORAL_CAPSULE | Freq: Four times a day (QID) | ORAL | 0 refills | Status: AC

## 2023-03-17 MED ORDER — IOHEXOL 300 MG/ML  SOLN
100.0000 mL | Freq: Once | INTRAMUSCULAR | Status: AC | PRN
  Administered 2023-03-17: 100 mL via INTRAVENOUS

## 2023-03-17 MED ORDER — POLYETHYLENE GLYCOL 3350 17 GM/SCOOP PO POWD
1.0000 | Freq: Once | ORAL | 0 refills | Status: AC
Start: 2023-03-17 — End: 2023-03-17

## 2023-03-17 MED ORDER — CEFTRIAXONE SODIUM 1 G IJ SOLR
1.0000 g | Freq: Once | INTRAMUSCULAR | Status: AC
  Administered 2023-03-17: 1 g via INTRAVENOUS
  Filled 2023-03-17: qty 10

## 2023-03-17 MED ORDER — ALUM & MAG HYDROXIDE-SIMETH 200-200-20 MG/5ML PO SUSP
30.0000 mL | Freq: Once | ORAL | Status: AC
  Administered 2023-03-17: 30 mL via ORAL
  Filled 2023-03-17: qty 30

## 2023-03-17 MED ORDER — LIDOCAINE VISCOUS HCL 2 % MT SOLN
15.0000 mL | Freq: Once | OROMUCOSAL | Status: AC
  Administered 2023-03-17: 15 mL via OROMUCOSAL
  Filled 2023-03-17: qty 15

## 2023-03-17 MED ORDER — FLEET ENEMA RE ENEM
1.0000 | ENEMA | Freq: Once | RECTAL | Status: AC
  Administered 2023-03-17: 1 via RECTAL
  Filled 2023-03-17: qty 1

## 2023-03-17 MED ORDER — SENNOSIDES-DOCUSATE SODIUM 8.6-50 MG PO TABS: 1.0000 | ORAL_TABLET | Freq: Every day | ORAL | 0 refills | Status: DC | PRN

## 2023-03-17 NOTE — Discharge Instructions (Addendum)
Your blood to show that your sodium level is getting low.  This likely from your poor diet.

## 2023-03-17 NOTE — ED Provider Notes (Signed)
[place] EMERGENCY DEPARTMENT AT [facility] Provider Note   CSN: [id number] Arrival date & time: 03/17/23  1307     History  Chief Complaint  Patient presents with   Constipation    Louis Gardner is a 62 y.o. male with history of prostate cancer with bone metastasis, on hormone therapy, on pluvicto 03/06/23, on megace, hx of anemia, hyponatremia, protein-calorie malnutrition, and dysphagia, here with constipation.  Patient reports no normal BM in about 10 days.  He is passing gas.  Poor appetite due to painful swallowing and nausea.  He does take oxycodone for pain using with increasing frequency.  He has not used laxatives.  He is concerned about weight loss.  He has a chronic indwelling Foley catheter that is due to be changed tomorrow, has been in for about 6 weeks, for bladder outlet obstruction  HPI     Home Medications Prior to Admission medications   Medication Sig Start Date End Date Taking? Authorizing Provider  cephALEXin (KEFLEX) 500 MG capsule Take 1 capsule (500 mg total) by mouth 4 (four) times daily for 7 days. 03/18/23 03/25/23 Yes Aiyanah Kalama, Kermit Balo, MD  polyethylene glycol powder (MIRALAX) 17 GM/SCOOP powder Take 255 g by mouth once for 1 dose. For 3-4 days as needed until regular bowel movements 03/17/23 03/17/23 Yes Michaeline Eckersley, Kermit Balo, MD  senna-docusate (SENOKOT-S) 8.6-50 MG tablet Take 1 tablet by mouth daily as needed for up to 30 doses for mild constipation. 03/17/23  Yes Terald Sleeper, MD  amLODipine (NORVASC) 10 MG tablet Take 10 mg by mouth daily. 04/19/21   [provider]  bisacodyl (DULCOLAX) 10 MG suppository Place 1 suppository (10 mg total) rectally daily as needed for up to 12 doses for moderate constipation. 12/15/22   Terald Sleeper, MD  ciprofloxacin (CIPRO) 500 MG tablet Take by mouth. 02/25/23   [provider]  feeding supplement (ENSURE ENLIVE / ENSURE PLUS) LIQD Take 237 mLs by mouth 3 (three) times  daily between meals. 01/24/23   Arnetha Courser, MD  JARDIANCE 10 MG TABS tablet Take 10 mg by mouth daily. 01/09/23   [provider]  magnesium hydroxide (MILK OF MAGNESIA) 400 MG/5ML suspension Take 5 mLs by mouth daily as needed for mild constipation.    [provider]  megestrol (MEGACE) 40 MG tablet Take 2 tablets (80 mg total) by mouth 2 (two) times daily. 02/12/23   Rickard Patience, MD  metFORMIN (GLUCOPHAGE) 500 MG tablet Take 1,000 mg by mouth 2 (two) times daily with a meal. 09/18/22   [provider]  nystatin (MYCOSTATIN) 100000 UNIT/ML suspension Use as directed 5 mLs (500,000 Units total) in the mouth or throat 4 (four) times daily. 03/14/23   Rickard Patience, MD  ondansetron (ZOFRAN-ODT) 8 MG disintegrating tablet Take 1 tablet (8 mg total) by mouth every 8 (eight) hours as needed for nausea. 02/12/23   Rickard Patience, MD  oxyCODONE (ROXICODONE) 5 MG immediate release tablet Take 1 tablet (5 mg total) by mouth every 6 (six) hours as needed for breakthrough pain or severe pain. 02/12/23   Rickard Patience, MD  pantoprazole (PROTONIX) 40 MG tablet Take 1 tablet (40 mg total) by mouth daily. 03/14/23   Rickard Patience, MD  polyethylene glycol (MIRALAX / GLYCOLAX) 17 g packet Take 17 g by mouth daily.    [provider]  potassium chloride SA (KLOR-CON M) 20 MEQ tablet Take 1 tablet (20 mEq total) by mouth 2 (two) times daily. 03/14/23  Rickard Patience, MD  pravastatin (PRAVACHOL) 20 MG tablet Take 20 mg by mouth daily. 02/06/21   [provider]  prochlorperazine (COMPAZINE) 10 MG tablet TAKE 1 TABLET(10 MG) BY MOUTH EVERY 6 HOURS AS NEEDED FOR NAUSEA OR VOMITING 03/17/23   Rickard Patience, MD  senna-docusate (SENOKOT-S) 8.6-50 MG tablet Take 1 tablet by mouth daily as needed for up to 30 doses for mild constipation. 12/15/22   Terald Sleeper, MD  tamsulosin (FLOMAX) 0.4 MG CAPS capsule Take 1 capsule by mouth daily. 08/21/22   [provider]      Allergies    Patient has no known  allergies.    Review of Systems   Review of Systems  Physical Exam Updated Vital Signs BP (!) 144/78   Pulse (!) 106   Temp 97.7 F (36.5 C) (Oral)   Resp 18   Ht 5\' 9"  (1.753 m)   Wt 54 kg   SpO2 100%   BMI 17.57 kg/m  Physical Exam Constitutional:      General: He is not in acute distress.    Comments: Thin, cachetic appearing  HENT:     Head: Normocephalic and atraumatic.  Eyes:     Conjunctiva/sclera: Conjunctivae normal.     Pupils: Pupils are equal, round, and reactive to light.  Cardiovascular:     Rate and Rhythm: Normal rate and regular rhythm.  Pulmonary:     Effort: Pulmonary effort is normal. No respiratory distress.  Abdominal:     General: There is no distension.     Tenderness: There is no abdominal tenderness.  Genitourinary:    Comments: Foley in place with dark, cloudy urine draining Skin:    General: Skin is warm and dry.  Neurological:     General: No focal deficit present.     Mental Status: He is alert. Mental status is at baseline.  Psychiatric:        Mood and Affect: Mood normal.        Behavior: Behavior normal.     ED Results / Procedures / Treatments   Labs (all labs ordered are listed, but only abnormal results are displayed) Labs Reviewed  CBC WITH DIFFERENTIAL/PLATELET - Abnormal; Notable for the following components:      Result Value   WBC 16.1 (*)    RBC 3.32 (*)    Hemoglobin 8.3 (*)    HCT 26.1 (*)    MCV 78.6 (*)    MCH 25.0 (*)    RDW 20.0 (*)    Platelets 486 (*)    Neutro Abs 12.3 (*)    Monocytes Absolute 1.5 (*)    Abs Immature Granulocytes 0.78 (*)    All other components within normal limits  COMPREHENSIVE METABOLIC PANEL - Abnormal; Notable for the following components:   Sodium 125 (*)    Potassium 3.4 (*)    Chloride 90 (*)    CO2 21 (*)    Glucose, Bld 275 (*)    Albumin 2.8 (*)    Alkaline Phosphatase 150 (*)    All other components within normal limits  URINALYSIS, ROUTINE W REFLEX MICROSCOPIC -  Abnormal; Notable for the following components:   Color, Urine AMBER (*)    APPearance CLOUDY (*)    Hgb urine dipstick LARGE (*)    Protein, ur 100 (*)    Nitrite POSITIVE (*)    Leukocytes,Ua LARGE (*)    Bacteria, UA MANY (*)    All other components within normal limits  URINE CULTURE    EKG None  Radiology CT ABDOMEN PELVIS W CONTRAST  Result Date: 03/17/2023 CLINICAL DATA:  Bowel obstruction suspected, constipation for 10 days. EXAM: CT ABDOMEN AND PELVIS WITH CONTRAST TECHNIQUE: Multidetector CT imaging of the abdomen and pelvis was performed using the standard protocol following bolus administration of intravenous contrast. RADIATION DOSE REDUCTION: This exam was performed according to the departmental dose-optimization program which includes automated exposure control, adjustment of the mA and/or kV according to patient size and/or use of iterative reconstruction technique. CONTRAST:  OMNIPAQUE IOHEXOL 300 MG/ML  SOLN COMPARISON:  01/07/2023 FINDINGS: Lower chest: Centrilobular micro nodules and tree-in-bud opacities in the lower lungs. Mild interlobular septal thickening. Hepatobiliary: Increased size and number of multifocal rounded hypoattenuating lesions throughout the liver. For example a 2.4 cm lesion in the anterior left hepatic lobe (series 2/image 19) previously measured 1.4 cm. Cholelithiasis. No biliary dilation. Pancreas: No ductal dilation or inflammatory stranding. Cystic focus in the uncinate process is unchanged in size measuring 8 mm. A 8 mm adjacent hyperenhancing focus is also unchanged. Spleen: Unremarkable. Adrenals/Urinary Tract: Stable adrenal gland. No urinary calculi or hydronephrosis. Foley catheter in the nondistended bladder. Stomach/Bowel: Large colonic stool load with large stool ball in the rectum. Colonic diverticulosis without diverticulitis. No evidence of obstruction or bowel wall thickening. Stomach and appendix are within normal limits.  Vascular/Lymphatic: No significant vascular findings are present. No enlarged abdominal or pelvic lymph nodes. Reproductive: No acute abnormality. Other: No free intraperitoneal fluid or air. Fat containing umbilical hernia. Musculoskeletal: Diffuse heterogenous sclerotic appearance of the bone marrow. Postsurgical changes anterior abdominal wall. IMPRESSION: 1. Large colonic stool load with large stool ball in the rectum consistent with constipation. 2. Increased size and number of hepatic metastases. 3. Diffuse heterogenous sclerotic appearance of the bone marrow, consistent with metastatic disease. 4. Centrilobular micro nodules and tree-in-bud opacities in the lower lungs, likely due to small airway infection/inflammation. 5. Cholelithiasis. 6. Stable cystic focus in the uncinate process of the pancreas and adjacent hyperenhancing focus. Continued attention on follow-up. Electronically Signed   By: Minerva Fester M.D.   On: 03/17/2023 20:10    Procedures Procedures    Medications Ordered in ED Medications  sodium chloride 0.9 % bolus 1,000 mL (1,000 mLs Intravenous New Bag/Given 03/17/23 1657)  alum & mag hydroxide-simeth (MAALOX/MYLANTA) 200-200-20 MG/5ML suspension 30 mL (30 mLs Oral Given 03/17/23 1630)  lidocaine (XYLOCAINE) 2 % viscous mouth solution 15 mL (15 mLs Mouth/Throat Given 03/17/23 1630)  cefTRIAXone (ROCEPHIN) 1 g in sodium chloride 0.9 % 100 mL IVPB (1 g Intravenous New Bag/Given 03/17/23 1654)  iohexol (OMNIPAQUE) 300 MG/ML solution 100 mL (100 mLs Intravenous Contrast Given 03/17/23 1658)  sodium phosphate (FLEET) enema 1 enema (1 enema Rectal Given 03/17/23 2028)    ED Course/ Medical Decision Making/ A&P Clinical Course as of 03/17/23 2131  Mon Mar 17, 2023  2127 Large BM with enema [MT]    Clinical Course User Index [MT] Terald Sleeper, MD                                 Medical Decision Making Amount and/or Complexity of Data Reviewed Labs:  ordered. Radiology: ordered.  Risk OTC drugs. Prescription drug management.   This patient presents to the ED with concern for constipation, poor appetite. This involves an extensive number of treatment options, and is a complaint that carries with it a high risk  of complications and morbidity.  The differential diagnosis includes opioid induced constipation vs bowel obstruction vs ileus vs obstructing mass vs other  This dysphagia is a chronic issue and has limited to his calorie intake.  He was prescribed Magic mouthwash but has not started using this at home.  He says he occasionally will use boost or Ensure drinks but does not like the taste and it has "upset his stomach".  We discussed options to drink slower, or to dilute the beverage with another drink, water, or milk, but the importance of drinking protein beverages like this regularly for muscle mass.    Co-morbidities that complicate the patient evaluation: malignancy at risk for SBO; opioid use for constipation  Additional history obtained from girlfriend at bedside  External records from outside source obtained and reviewed including oncology evaluation for pertinent history and treatment - [facility], Dr. Cathie Hoops  I ordered and personally interpreted labs.  The pertinent results include:  Na 125 (acute on chronic hyponatremia), Alb 2.8, Cr 0.92, UA with +nitrites and leuks suggestive of possible infection (note this is from catheter) - will exchange foley ; WBC elevated at 16.1  I ordered imaging studies including ct abdomen pelvis with IV contrast I independently visualized and interpreted imaging which showed fecal impaction of stool ball, metastatic cancer disease progression I agree with the radiologist interpretation  The patient was maintained on a cardiac monitor.  I personally viewed and interpreted the cardiac monitored which showed an underlying rhythm of: sinus rhythm and sinus tachycardia  I ordered medication including IV  saline fluids for hydration and hyponatremia.  IV Rocephin for urinary tract infection.  GI cocktail for dysphagia; enema for constipation  I have reviewed the patients home medicines and have made adjustments as needed  Test Considered: no hypoxia, or evidence of acute PNA, no indication for thoracic imaging at this time; no symptoms or signs of CVA or stroke   After the interventions noted above, I reevaluated the patient and found that they have: improved  - bowels moving; reported odynophagia improved with GI cocktail  Social Determinants of Health:dietary/calorie discussion  Dispostion:  After consideration of the diagnostic results and the patients response to treatment, I feel that the patent would benefit from close outpatient follow up         Final Clinical Impression(s) / ED Diagnoses Final diagnoses:  Constipation, unspecified constipation type  Hyponatremia  Odynophagia  Weight loss    Rx / DC Orders ED Discharge Orders          Ordered    cephALEXin (KEFLEX) 500 MG capsule  4 times daily        03/17/23 2130    polyethylene glycol powder (MIRALAX) 17 GM/SCOOP powder   Once        03/17/23 2130    senna-docusate (SENOKOT-S) 8.6-50 MG tablet  Daily PRN        03/17/23 2130              Terald Sleeper, MD 03/17/23 2131

## 2023-03-17 NOTE — ED Triage Notes (Signed)
Patient is here for evaluation of constipation. Reports last good bowel movement was about 10 days ago. States when he tries to poop, he strains and only a very little comes out. Pt reports taking oxycodone every 8 hours for pain for his cancer.

## 2023-03-17 NOTE — ED Notes (Signed)
Drainage bag changed to leg bag 

## 2023-03-17 NOTE — ED Provider Triage Note (Signed)
Emergency Medicine Provider Triage Evaluation Note  Louis Gardner , a 62 y.o. male  was evaluated in triage.  Pt complains of constipation.  Review of Systems  Positive: constipation Negative: Vomiting, fever, abdominal pain  Physical Exam  BP 111/70 (BP Location: Right Arm)   Pulse (!) 113   Temp 98.4 F (36.9 C) (Oral)   Resp 18   Ht 5\' 9"  (1.753 m)   Wt 54 kg   SpO2 100%   BMI 17.57 kg/m  Gen:   Awake, no distress   Resp:  Normal effort  MSK:   Moves extremities without difficulty  Other:    Medical Decision Making  Medically screening exam initiated at 1:35 PM.  Appropriate orders placed.  Louis Gardner was informed that the remainder of the evaluation will be completed by another provider, this initial triage assessment does not replace that evaluation, and the importance of remaining in the ED until their evaluation is complete.  Feels like he needs to pass stool but can't. Passing flatus. No abdominal pain or vomiting. History of constipation.    Elpidio Anis, PA-C 03/17/23 1336

## 2023-03-20 ENCOUNTER — Other Ambulatory Visit: Payer: Self-pay

## 2023-03-20 ENCOUNTER — Other Ambulatory Visit: Payer: Self-pay | Admitting: Oncology

## 2023-03-20 ENCOUNTER — Other Ambulatory Visit: Payer: Medicaid Other

## 2023-03-20 ENCOUNTER — Encounter: Payer: Self-pay | Admitting: Nurse Practitioner

## 2023-03-20 ENCOUNTER — Inpatient Hospital Stay: Payer: Medicaid Other | Admitting: Nurse Practitioner

## 2023-03-20 ENCOUNTER — Other Ambulatory Visit: Payer: Self-pay | Admitting: *Deleted

## 2023-03-20 ENCOUNTER — Inpatient Hospital Stay: Payer: Medicaid Other

## 2023-03-20 VITALS — BP 109/67 | HR 85 | Resp 100

## 2023-03-20 VITALS — BP 101/78 | HR 104 | Temp 97.1°F | Wt 116.0 lb

## 2023-03-20 DIAGNOSIS — C61 Malignant neoplasm of prostate: Secondary | ICD-10-CM

## 2023-03-20 DIAGNOSIS — D649 Anemia, unspecified: Secondary | ICD-10-CM | POA: Diagnosis not present

## 2023-03-20 DIAGNOSIS — R64 Cachexia: Secondary | ICD-10-CM | POA: Insufficient documentation

## 2023-03-20 DIAGNOSIS — C7951 Secondary malignant neoplasm of bone: Secondary | ICD-10-CM | POA: Diagnosis not present

## 2023-03-20 DIAGNOSIS — E860 Dehydration: Secondary | ICD-10-CM

## 2023-03-20 LAB — CBC WITH DIFFERENTIAL (CANCER CENTER ONLY)
Abs Immature Granulocytes: 0.98 10*3/uL — ABNORMAL HIGH (ref 0.00–0.07)
Basophils Absolute: 0 10*3/uL (ref 0.0–0.1)
Basophils Relative: 0 %
Eosinophils Absolute: 0 10*3/uL (ref 0.0–0.5)
Eosinophils Relative: 0 %
HCT: 24.7 % — ABNORMAL LOW (ref 39.0–52.0)
Hemoglobin: 7.8 g/dL — ABNORMAL LOW (ref 13.0–17.0)
Immature Granulocytes: 8 %
Lymphocytes Relative: 18 %
Lymphs Abs: 2.3 10*3/uL (ref 0.7–4.0)
MCH: 24.7 pg — ABNORMAL LOW (ref 26.0–34.0)
MCHC: 31.6 g/dL (ref 30.0–36.0)
MCV: 78.2 fL — ABNORMAL LOW (ref 80.0–100.0)
Monocytes Absolute: 1.2 10*3/uL — ABNORMAL HIGH (ref 0.1–1.0)
Monocytes Relative: 9 %
Neutro Abs: 8.2 10*3/uL — ABNORMAL HIGH (ref 1.7–7.7)
Neutrophils Relative %: 65 %
Platelet Count: 432 10*3/uL — ABNORMAL HIGH (ref 150–400)
RBC: 3.16 MIL/uL — ABNORMAL LOW (ref 4.22–5.81)
RDW: 19.2 % — ABNORMAL HIGH (ref 11.5–15.5)
Smear Review: NORMAL
WBC Count: 12.7 10*3/uL — ABNORMAL HIGH (ref 4.0–10.5)
nRBC: 0.2 % (ref 0.0–0.2)

## 2023-03-20 LAB — URINE CULTURE: Culture: >=100000 — AB

## 2023-03-20 LAB — CMP (CANCER CENTER ONLY)
ALT: 10 U/L (ref 0–44)
AST: 16 U/L (ref 15–41)
Albumin: 2.7 g/dL — ABNORMAL LOW (ref 3.5–5.0)
Alkaline Phosphatase: 121 U/L (ref 38–126)
Anion gap: 11 (ref 5–15)
BUN: 13 mg/dL (ref 8–23)
CO2: 24 mmol/L (ref 22–32)
Calcium: 9 mg/dL (ref 8.9–10.3)
Chloride: 93 mmol/L — ABNORMAL LOW (ref 98–111)
Creatinine: 0.7 mg/dL (ref 0.61–1.24)
GFR, Estimated: >60 mL/min (ref >60)
Glucose, Bld: 232 mg/dL — ABNORMAL HIGH (ref 70–99)
Potassium: 3.9 mmol/L (ref 3.5–5.1)
Sodium: 128 mmol/L — ABNORMAL LOW (ref 135–145)
Total Bilirubin: 0.4 mg/dL (ref <1.2)
Total Protein: 7.3 g/dL (ref 6.5–8.1)

## 2023-03-20 LAB — RETIC PANEL
Immature Retic Fract: 35.2 % — ABNORMAL HIGH (ref 2.3–15.9)
RBC.: 3.17 MIL/uL — ABNORMAL LOW (ref 4.22–5.81)
Retic Count, Absolute: 41.8 10*3/uL (ref 19.0–186.0)
Retic Ct Pct: 1.3 % (ref 0.4–3.1)
Reticulocyte Hemoglobin: 23.3 pg — ABNORMAL LOW (ref >27.9)

## 2023-03-20 LAB — TYPE AND SCREEN
ABO/RH(D): O POS
Antibody Screen: NEGATIVE

## 2023-03-20 MED ORDER — SODIUM CHLORIDE 0.9 % IV SOLN
INTRAVENOUS | Status: DC
  Filled 2023-03-20 (×2): qty 250

## 2023-03-20 MED ORDER — IRON SUCROSE 20 MG/ML IV SOLN
200.0000 mg | Freq: Once | INTRAVENOUS | Status: AC
  Administered 2023-03-20: 200 mg via INTRAVENOUS
  Filled 2023-03-20: qty 10

## 2023-03-20 NOTE — Progress Notes (Signed)
Hematology/Oncology Consult Note [phone] [phone]   CHIEF COMPLAINTS/PURPOSE OF CONSULTATION:  metastatic prostate cancer  HISTORY OF PRESENTING ILLNESS:  Louis Gardner 62 y.o. male who established care for metastatic prostate cancer Summary of oncologic history is as follows: Oncology History  Prostate cancer metastatic to bone [facility])  05/29/2021 Initial Diagnosis   Prostate cancer metastatic to bone (HCC)  03/19/21 - CT A/P with contrast performed to follow up on pancreatic mass, with near resolution of pancreatic uncinate process. Bulky multistation abdominopelvic lymphadenopathy identified (e.g. left para-aortic node measuring 2.1cm and right common iliac node measuring 1.4cm). No concerning osseous lesions identified. Enlarged, heterogeneously enhancing prostate gland. 04/17/21 - PSA 48.83 05/29/21 - retroperitoneal lymph node core needle biopsy, pathology consistent with metastatic prostate adenocarcinoma. 06/27/21 - PSMA-PET scan with "widely metastatic osseous disease as well as avid adenopathy extending from the pelvis to the left supraclavicular region consistent with metastatic prostate cancer. Noting that there is one single enlarged retroperitoneal node with low/minimal tracer avidity measuring up to 4 cm."  07/31/2022 PSA 406.39 08/27/21- 12/24/2021, 4 cycles. -docetaxel chemotherapy 75mg /m2 mid April 2023- darolutamide per ARASENS trial in mid April 2023  01/14/2022 PSMA showed marked interval improvement, tracer avid osseous disease now with only 6 lesions that demonstrating mild residual focal avidity  01/29/2023 PSA nadir  0.5 08/21/2022 PSA 3.32 09/25/2022, he developed castration resistant disease -Diffuse sclerotic lesions throughout the skeleton some of which demonstrate Pylarify uptake, as described in the body of the report, concerning for Pylarify avid metastatic disease. Uptake of the Pylarify avid lesions is overall similar to that of the  liver.  -New uptake in the right posterior prostate and right seminal vesicle, concerning for malignancy.   Patient was recommended to stop darolutamide.  He was offered Pluvicto versus salvage chemotherapy with cabazitaxel.  He was undecided. 10/28/2022 PSA 18.92.         11/23/2022 Tumor Marker   PSA 117.5   11/23/2022 Imaging   Patient presented emergency room for evaluation of chest pain for 2 days. CT chest angiogram PE protocol 1. No evidence for acute pulmonary embolus. 2. Diffusely abnormal bones with multifocal areas of abnormal increased sclerosis noted throughout the thoracic spine, sternum and ribs concerning for diffuse osseous metastasis. 3. Prominent mediastinal and hilar lymph nodes are identified. These are nonspecific and may be reactive. Nodal metastasis not excluded. 4. Focal area of bronchiectasis with adjacent peripheral tree-in-bud nodularity in the right upper lobe. This is favored to represent sequelae of prior infection or inflammation. 5. Bilateral adrenal gland hyperplasia. 6. Cholelithiasis. 7. Irregular subpleural nodule in the anterior left upper lobe measures 4 mm. 8.  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 He continues megace 80 mg BID and appetite and weight have improved. Pain is the same and well controlled. He generally feels tired and weak. PSA was 352 on 02/24/23. He has had ongoing anemia thought to be secondary to metastatic  carcinoma and has required transfusional support. He's had diarrhea following treatment but otherwise  feels at baseline.    MEDICAL HISTORY:  Past Medical History:  Diagnosis Date   Arthritis    Depression    GI bleeding    Hypertension    Pancreatic cancer (HCC)    Sleep apnea     SURGICAL HISTORY: Past Surgical History:  Procedure Laterality Date   COLONOSCOPY     COLONOSCOPY WITH PROPOFOL N/A 12/09/2022   Procedure: COLONOSCOPY WITH PROPOFOL;  Surgeon: Wyline Mood, MD;  Location: [facility] ENDOSCOPY;  Service: Gastroenterology;  Laterality: N/A;   ESOPHAGOGASTRODUODENOSCOPY (EGD) WITH PROPOFOL N/A 03/19/2016   Procedure: ESOPHAGOGASTRODUODENOSCOPY (EGD) WITH PROPOFOL;  Surgeon: Christena Deem, MD;  Location: [facility] ENDOSCOPY;  Service: Endoscopy;  Laterality: N/A;   IR BONE MARROW BIOPSY & ASPIRATION  01/24/2023   IR CT BONE TROCAR/NEEDLE BIOPSY DEEP  01/24/2023    SOCIAL HISTORY: Social History   Socioeconomic History   Marital status: Single    Spouse name: Not on file   Number of children: Not on file   Years of education: Not on file   Highest education level: Not on file  Occupational History   Not on file  Tobacco Use   Smoking status: Every Day    Current packs/day: 0.25    Average packs/day: 0.3 packs/day for 48.0 years (12.0 ttl pk-yrs)    Types: Cigarettes    Passive exposure: Current   Smokeless tobacco: Never  Vaping Use   Vaping status: Never Used  Substance and Sexual Activity   Alcohol use: Yes    Alcohol/week: 6.0 standard drinks of alcohol    Types: 6 Cans of beer per week   Drug use: No   Sexual activity: Not on file  Other Topics Concern   Not on file  Social History Narrative   Not on file   Social Determinants of Health   Financial Resource Strain: Low Risk  (11/27/2022)   Overall Financial Resource Strain (CARDIA)    Difficulty of Paying Living Expenses: Not very hard  Food Insecurity: No Food Insecurity (01/18/2023)   Hunger Vital Sign     Worried About Running Out of Food in the Last Year: Never true    Ran Out of Food in the Last Year: Never true  Recent Concern: Food Insecurity - Food Insecurity Present (11/27/2022)   Hunger Vital Sign    Worried About Running Out of Food in the Last Year: Sometimes true    Ran Out of Food in the Last Year: Never true  Transportation Needs: No Transportation Needs (01/18/2023)   PRAPARE - Administrator, Civil Service (Medical): No    Lack of Transportation (Non-Medical): No  Physical Activity: Not on file  Stress: No Stress Concern Present (11/27/2022)   [employer] of Occupational Health - Occupational Stress Questionnaire    Feeling of Stress : Only a little  Social Connections: Not on file  Intimate Partner Violence: Not At Risk (01/18/2023)   Humiliation, Afraid, Rape, and Kick questionnaire    Fear of Current or Ex-Partner: No    Emotionally Abused: No    Physically Abused: No    Sexually Abused: No    FAMILY HISTORY: History reviewed. No pertinent family history.  ALLERGIES:  has No Known Allergies.  MEDICATIONS:  Current Outpatient Medications  Medication Sig Dispense Refill   amLODipine (NORVASC) 10 MG tablet Take 10 mg by mouth daily.     bisacodyl (DULCOLAX) 10 MG suppository Place 1 suppository (10 mg total) rectally daily as needed for up to 12  doses for moderate constipation. 12 suppository 0   cephALEXin (KEFLEX) 500 MG capsule Take 1 capsule (500 mg total) by mouth 4 (four) times daily for 7 days. 28 capsule 0   ciprofloxacin (CIPRO) 500 MG tablet Take by mouth.     feeding supplement (ENSURE ENLIVE / ENSURE PLUS) LIQD Take 237 mLs by mouth 3 (three) times daily between meals. 237 mL 12   JARDIANCE 10 MG TABS tablet Take 10 mg by mouth daily.     magnesium hydroxide (MILK OF MAGNESIA) 400 MG/5ML suspension Take 5 mLs by mouth daily as needed for mild constipation.     megestrol (MEGACE) 40 MG tablet Take 2 tablets (80 mg total) by mouth 2 (two)  times daily. 120 tablet 0   metFORMIN (GLUCOPHAGE) 500 MG tablet Take 1,000 mg by mouth 2 (two) times daily with a meal.     nystatin (MYCOSTATIN) 100000 UNIT/ML suspension Use as directed 5 mLs (500,000 Units total) in the mouth or throat 4 (four) times daily. 473 mL 0   ondansetron (ZOFRAN-ODT) 8 MG disintegrating tablet Take 1 tablet (8 mg total) by mouth every 8 (eight) hours as needed for nausea. 60 tablet 1   oxyCODONE (ROXICODONE) 5 MG immediate release tablet Take 1 tablet (5 mg total) by mouth every 6 (six) hours as needed for breakthrough pain or severe pain. 90 tablet 0   pantoprazole (PROTONIX) 40 MG tablet Take 1 tablet (40 mg total) by mouth daily. 90 tablet 1   polyethylene glycol (MIRALAX / GLYCOLAX) 17 g packet Take 17 g by mouth daily.     potassium chloride SA (KLOR-CON M) 20 MEQ tablet Take 1 tablet (20 mEq total) by mouth 2 (two) times daily.     pravastatin (PRAVACHOL) 20 MG tablet Take 20 mg by mouth daily.     prochlorperazine (COMPAZINE) 10 MG tablet TAKE 1 TABLET(10 MG) BY MOUTH EVERY 6 HOURS AS NEEDED FOR NAUSEA OR VOMITING 90 tablet 0   senna-docusate (SENOKOT-S) 8.6-50 MG tablet Take 1 tablet by mouth daily as needed for up to 30 doses for mild constipation. 30 tablet 0   senna-docusate (SENOKOT-S) 8.6-50 MG tablet Take 1 tablet by mouth daily as needed for up to 30 doses for mild constipation. 30 tablet 0   tamsulosin (FLOMAX) 0.4 MG CAPS capsule Take 1 capsule by mouth daily.     No current facility-administered medications for this visit.    Review of Systems  Constitutional:  Negative for appetite change, chills, fatigue, fever and unexpected weight change.       Weight improved  HENT:   Negative for hearing loss.   Eyes:  Negative for icterus.  Respiratory:  Negative for chest tightness, cough and shortness of breath.   Cardiovascular:  Negative for chest pain and leg swelling.  Gastrointestinal:  Negative for abdominal distention, abdominal pain,  constipation and diarrhea.  Endocrine: Negative for hot flashes.  Genitourinary:  Negative for hematuria.        Foley catheter  Musculoskeletal:  Positive for back pain.  Skin:  Negative for itching and rash.  Neurological:  Positive for numbness. Negative for dizziness and light-headedness.  Hematological:  Negative for adenopathy. Does not bruise/bleed easily.  Psychiatric/Behavioral:  Negative for confusion.     PHYSICAL EXAMINATION: ECOG PERFORMANCE STATUS: 2 - Symptomatic, <50% confined to bed Vitals:   03/20/23 1031  BP: 101/78  Pulse: (!) 104  Temp: (!) 97.1 F (36.2 C)  SpO2: 98%   Filed Weights  03/20/23 1031  Weight: 116 lb (52.6 kg)   Physical Exam Constitutional:      Comments: Thin build. Frail appearing. Accompanied and family on phone.   Cardiovascular:     Rate and Rhythm: Tachycardia present.  Skin:    Coloration: Skin is not pale.  Neurological:     Mental Status: He is alert and oriented to person, place, and time.  Psychiatric:        Mood and Affect: Mood normal.        Behavior: Behavior normal.     LABORATORY DATA:  I have reviewed the data as listed    Latest Ref Rng & Units 03/20/2023   10:12 AM 03/17/2023    1:56 PM 03/14/2023    9:10 AM  CBC  WBC 4.0 - 10.5 K/uL 12.7  16.1  13.8   Hemoglobin 13.0 - 17.0 g/dL 7.8  8.3  9.1   Hematocrit 39.0 - 52.0 % 24.7  26.1  29.6   Platelets 150 - 400 K/uL 432  486  586       Latest Ref Rng & Units 03/17/2023    1:56 PM 03/14/2023    9:01 AM 03/06/2023    2:09 PM  CMP  Glucose 70 - 99 mg/dL 259  563    BUN 8 - 23 mg/dL 15  13    Creatinine 8.75 - 1.24 mg/dL 6.43  3.29  5.18   Sodium 135 - 145 mmol/L 125  128    Potassium 3.5 - 5.1 mmol/L 3.4  3.7    Chloride 98 - 111 mmol/L 90  91    CO2 22 - 32 mmol/L 21  22    Calcium 8.9 - 10.3 mg/dL 8.9  9.2    Total Protein 6.5 - 8.1 g/dL 7.9  9.1    Total Bilirubin <1.2 mg/dL 0.8  0.8    Alkaline Phos 38 - 126 U/L 150  142    AST 15 - 41 U/L 26   25    ALT 0 - 44 U/L 13  10       RADIOGRAPHIC STUDIES: I have personally reviewed the radiological images as listed and agreed with the findings in the report. CT ABDOMEN PELVIS W CONTRAST  Result Date: 03/17/2023 CLINICAL DATA:  Bowel obstruction suspected, constipation for 10 days. EXAM: CT ABDOMEN AND PELVIS WITH CONTRAST TECHNIQUE: Multidetector CT imaging of the abdomen and pelvis was performed using the standard protocol following bolus administration of intravenous contrast. RADIATION DOSE REDUCTION: This exam was performed according to the departmental dose-optimization program which includes automated exposure control, adjustment of the mA and/or kV according to patient size and/or use of iterative reconstruction technique. CONTRAST:  OMNIPAQUE IOHEXOL 300 MG/ML  SOLN COMPARISON:  01/07/2023 FINDINGS: Lower chest: Centrilobular micro nodules and tree-in-bud opacities in the lower lungs. Mild interlobular septal thickening. Hepatobiliary: Increased size and number of multifocal rounded hypoattenuating lesions throughout the liver. For example a 2.4 cm lesion in the anterior left hepatic lobe (series 2/image 19) previously measured 1.4 cm. Cholelithiasis. No biliary dilation. Pancreas: No ductal dilation or inflammatory stranding. Cystic focus in the uncinate process is unchanged in size measuring 8 mm. A 8 mm adjacent hyperenhancing focus is also unchanged. Spleen: Unremarkable. Adrenals/Urinary Tract: Stable adrenal gland. No urinary calculi or hydronephrosis. Foley catheter in the nondistended bladder. Stomach/Bowel: Large colonic stool load with large stool ball in the rectum. Colonic diverticulosis without diverticulitis. No evidence of obstruction or bowel wall thickening. Stomach and appendix  are within normal limits. Vascular/Lymphatic: No significant vascular findings are present. No enlarged abdominal or pelvic lymph nodes. Reproductive: No acute abnormality. Other: No free  intraperitoneal fluid or air. Fat containing umbilical hernia. Musculoskeletal: Diffuse heterogenous sclerotic appearance of the bone marrow. Postsurgical changes anterior abdominal wall. IMPRESSION: 1. Large colonic stool load with large stool ball in the rectum consistent with constipation. 2. Increased size and number of hepatic metastases. 3. Diffuse heterogenous sclerotic appearance of the bone marrow, consistent with metastatic disease. 4. Centrilobular micro nodules and tree-in-bud opacities in the lower lungs, likely due to small airway infection/inflammation. 5. Cholelithiasis. 6. Stable cystic focus in the uncinate process of the pancreas and adjacent hyperenhancing focus. Continued attention on follow-up. Electronically Signed   By: Minerva Fester M.D.   On: 03/17/2023 20:10   NM PLUVICTO ADMINISTRATION  Result Date: 03/06/2023 CLINICAL DATA:  62 year old male with metastatic prostate carcinoma. Castrate resistant carcinoma. Trial taxane chemotherapy. Progression with bone metastasis. PSA elevated 350. Anemia noted prior to therapy. Foley catheter in place. 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 Initial flush of the intravenous catheter was performed was sterile saline. The dose syringe was connected to the catheter and the Lu-177 Pluvicto administered over a 1 to 10 min infusion. Single 10 cc lushes with normal saline follow the dose. No complications were noted. The entire IV tubing, venocatheter, stopcock and syringes was removed in total, placed in a disposal bag and sent for assay of the residual activity, which will be reported at a later time in our EMR by the physics staff. Pressure was applied to the venipuncture  site, and a compression bandage placed. Patient monitored for 1 hour following infusion. Radiation Safety personnel were present to perform the discharge survey, as detailed on their documentation. After a short period of observation, the patient had his IV removed. RADIOPHARMACEUTICALS:  204.92 microcuries Lu-177 PLUVICTO FINDINGS: Current Infusion: 1 Planned Infusions: 6 Patient presented to nuclear medicine for treatment. The patient's most recent blood counts were reviewed and remains a adequate candidate to proceed with Lu-177 Pluvicto. Patient hemoglobin equal 8.2 increased from 7.7. Recent transfusion. Creatinine normal.  PSA equal 352 potassium is improved to 4.3 from 2.7. The patient was situated in an infusion suite with a contact barrier placed under the arm. Intravenous access was established, using sterile technique, and a normal saline infusion from a syringe was started. Micro-dosimetry: The prescribed radiation activity was assayed and confirmed to be within specified tolerance. IMPRESSION: Current Infusion: 1 Planned Infusions: 6 The patient tolerated the infusion well. The patient will return in 6 weeks for ongoing care. Electronically Signed   By: Genevive Bi M.D.   On: 03/06/2023 16:39     ASSESSMENT & PLAN:   Prostate cancer metastatic to bone- recurrence. Now castrate resistant disease. Started pluvicto on 03/06/23 with Dr. Amil Amen. Tolerated well. Follow up with Dr Cathie Hoops.  Next dose planned for 04/15/23.   Normocytic anemia Multifactorial, largely due to bone marrow prostate cancer involvement.  Decreased reticulocyte hemoglobin, got empiric IV Venofer.  Hemoglobin 7.8 today. Plan for venofer today and plan for 1 unit pRBCs tomorrow- irradiated given active treatment.  Will likely need transfusion prior to or post pluvicto    Dysphagia He was seen by GI during recent admission, was  felt to be due to dysmotility.  Continue PPI.  Follow up with GI outpatient for EGD IVF normal  saline 1 L today   Hyponatremia Acute on chronic, dated back to 2021.  Hydration session weekly   Protein-calorie malnutrition, severe  Encouraged nutritional supplements Improved with megace 80 mg BID IV fluids today.  Family questions use of marinol. Reviewed that marinol does not typically improve appetite but may improve pain and nausea. Patient wishes to hold off.    Neoplasm related pain Continue oxycodone 5mg  Q6h-8h PRN Family questions use of fentanyl. Pain currently well controlled. Follow up with palliative care.    Disposition:  Venofer today with IVF 1 unit pRBCs tomorrow Plan to follow up with Dr Cathie Hoops as scheduled- la  All questions were answered. The patient knows to call the clinic with any problems, questions or concerns.  Consuello Masse, DNP, AGNP-C, [facility] [phone] (clinic) 03/20/2023

## 2023-03-21 ENCOUNTER — Other Ambulatory Visit: Payer: Medicaid Other

## 2023-03-21 ENCOUNTER — Ambulatory Visit: Payer: Medicaid Other | Admitting: Oncology

## 2023-03-21 ENCOUNTER — Telehealth (HOSPITAL_BASED_OUTPATIENT_CLINIC_OR_DEPARTMENT_OTHER): Payer: Self-pay | Admitting: Emergency Medicine

## 2023-03-21 ENCOUNTER — Inpatient Hospital Stay: Payer: Medicaid Other

## 2023-03-21 ENCOUNTER — Ambulatory Visit: Payer: Medicaid Other

## 2023-03-21 ENCOUNTER — Other Ambulatory Visit: Payer: Self-pay

## 2023-03-21 ENCOUNTER — Encounter: Payer: Self-pay | Admitting: Oncology

## 2023-03-21 LAB — SAMPLE TO BLOOD BANK

## 2023-03-21 LAB — PREPARE RBC (CROSSMATCH)

## 2023-03-21 MED ORDER — POTASSIUM CHLORIDE CRYS ER 20 MEQ PO TBCR: 40.0000 meq | EXTENDED_RELEASE_TABLET | Freq: Every day | ORAL | 0 refills | Status: DC

## 2023-03-21 NOTE — Telephone Encounter (Signed)
           Component Ref Range & Units 1 d ago (03/20/23) 4 d ago (03/17/23) 7 d ago (03/14/23) 2 wk ago (03/06/23) 2 wk ago (03/06/23) 3 wk ago (02/27/23) 3 wk ago (02/23/23)  Potassium 3.5 - 5.1 mmol/L 3.9 3.4 Low  3.7

## 2023-03-21 NOTE — Telephone Encounter (Signed)
Post ED Visit - Positive Culture Follow-up  Culture report reviewed by antimicrobial stewardship pharmacist: [facility] []  Enzo Bi, Pharm.D. []  Celedonio Miyamoto, [address].D., BCPS AQ-ID []  Garvin Fila, Pharm.D., BCPS [x]  Georgina Pillion, Pharm.D., BCPS []  [place], [address].D., BCPS, AAHIVP []  Estella Husk, Pharm.D., BCPS, AAHIVP []  Lysle Pearl, PharmD, BCPS []  Phillips Climes, PharmD, BCPS []  Agapito Games, PharmD, BCPS []  Verlan Friends, PharmD []  Mervyn Gay, PharmD, BCPS []  Vinnie Level, PharmD  [facility] []  Len Childs, PharmD []  Greer Pickerel, PharmD []  Adalberto Cole, PharmD []  Perlie Gold, Rph []  Lonell Face) Jean Rosenthal, PharmD []  Earl Many, PharmD []  Junita Push, PharmD []  Dorna Leitz, PharmD []  Terrilee Files, PharmD []  Lynann Beaver, PharmD []  Keturah Barre, PharmD []  Loralee Pacas, PharmD []  Bernadene Person, PharmD   Positive urine culture Treated with Cephalexin, organism sensitive to the same and no further patient follow-up is required at this time.  Louis Gardner 03/21/2023, 9:07 AM

## 2023-03-24 ENCOUNTER — Inpatient Hospital Stay (HOSPITAL_BASED_OUTPATIENT_CLINIC_OR_DEPARTMENT_OTHER): Payer: Medicaid Other | Admitting: Hospice and Palliative Medicine

## 2023-03-24 DIAGNOSIS — C7951 Secondary malignant neoplasm of bone: Secondary | ICD-10-CM

## 2023-03-24 DIAGNOSIS — G893 Neoplasm related pain (acute) (chronic): Secondary | ICD-10-CM

## 2023-03-24 DIAGNOSIS — C61 Malignant neoplasm of prostate: Secondary | ICD-10-CM

## 2023-03-24 DIAGNOSIS — Z515 Encounter for palliative care: Secondary | ICD-10-CM

## 2023-03-24 MED ORDER — OXYCODONE HCL 5 MG PO TABS: 5.0000 mg | ORAL_TABLET | Freq: Four times a day (QID) | ORAL | 0 refills | Status: DC | PRN

## 2023-03-24 NOTE — Progress Notes (Signed)
Virtual Visit via Telephone Note  I connected with [name] on 03/24/23 at  1:20 PM EST by telephone and verified that I am speaking with the correct person using two identifiers.  Location: Patient: Telephone Provider: Clinic   I discussed the limitations, risks, security and privacy concerns of performing an evaluation and management service by telephone and the availability of in person appointments. I also discussed with the patient that there may be a patient responsible charge related to this service. The patient expressed understanding and agreed to proceed.   History of Present Illness: Louis Gardner is a 62 y.o. 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  Patient was referred palliative care to address goals and manage ongoing symptoms.   Observations/Objective: I called and spoke with patient and wife by phone.  Patient says that he is doing "okay".  Denies any significant changes or concerns.  No symptomatic complaints at present.  Reports stable pain taking as needed oxycodone.  Patient does request refill of oxycodone today.  Says that he is tolerating it well and denies any adverse effects.  Patient says that his appetite remains poor.  He has follow-up tomorrow with nutritionist.  Denies significant anxiety or depression today.  Previously referred to [facility].  Assessment and Plan: Stage IV prostate cancer -on Pluvicto.  Patient has follow-up appointment later this week with Dr. Cathie Hoops.  Neoplasm related pain -refill oxycodone  Anxiety/depression - pending referral to [facility]  Follow Up Instructions: Follow-up telephone visit 1 month   I discussed the assessment and treatment plan with the patient. The patient was provided an opportunity to ask questions and all were answered. The patient agreed with the plan and demonstrated an understanding of the instructions.   The patient was  advised to call back or seek an in-person evaluation if the symptoms worsen or if the condition fails to improve as anticipated.  I provided 5 minutes of non-face-to-face time during this encounter.   [name], NP

## 2023-03-25 ENCOUNTER — Inpatient Hospital Stay: Payer: Medicaid Other

## 2023-03-25 NOTE — Progress Notes (Signed)
Nutrition  Patient did not show up for scheduled nutrition appointment today.   Kevis Qu B. Freida Busman, RD, LDN Registered Dietitian [phone]

## 2023-03-26 ENCOUNTER — Other Ambulatory Visit: Payer: Self-pay | Admitting: Oncology

## 2023-03-26 ENCOUNTER — Other Ambulatory Visit: Payer: Self-pay

## 2023-03-26 ENCOUNTER — Inpatient Hospital Stay: Payer: Medicaid Other

## 2023-03-26 DIAGNOSIS — D649 Anemia, unspecified: Secondary | ICD-10-CM

## 2023-03-26 DIAGNOSIS — C7951 Secondary malignant neoplasm of bone: Secondary | ICD-10-CM

## 2023-03-26 DIAGNOSIS — C61 Malignant neoplasm of prostate: Secondary | ICD-10-CM | POA: Diagnosis not present

## 2023-03-26 LAB — CMP (CANCER CENTER ONLY)
ALT: 8 U/L (ref 0–44)
AST: 20 U/L (ref 15–41)
Albumin: 3.1 g/dL — ABNORMAL LOW (ref 3.5–5.0)
Alkaline Phosphatase: 155 U/L — ABNORMAL HIGH (ref 38–126)
Anion gap: 16 — ABNORMAL HIGH (ref 5–15)
BUN: 10 mg/dL (ref 8–23)
CO2: 23 mmol/L (ref 22–32)
Calcium: 9.6 mg/dL (ref 8.9–10.3)
Chloride: 92 mmol/L — ABNORMAL LOW (ref 98–111)
Creatinine: 0.89 mg/dL (ref 0.61–1.24)
GFR, Estimated: >60 mL/min (ref >60)
Glucose, Bld: 140 mg/dL — ABNORMAL HIGH (ref 70–99)
Potassium: 4.5 mmol/L (ref 3.5–5.1)
Sodium: 131 mmol/L — ABNORMAL LOW (ref 135–145)
Total Bilirubin: 0.5 mg/dL (ref <1.2)
Total Protein: 7.9 g/dL (ref 6.5–8.1)

## 2023-03-26 LAB — CBC WITH DIFFERENTIAL (CANCER CENTER ONLY)
Abs Immature Granulocytes: 0.55 10*3/uL — ABNORMAL HIGH (ref 0.00–0.07)
Basophils Absolute: 0 10*3/uL (ref 0.0–0.1)
Basophils Relative: 0 %
Eosinophils Absolute: 0 10*3/uL (ref 0.0–0.5)
Eosinophils Relative: 0 %
HCT: 26.7 % — ABNORMAL LOW (ref 39.0–52.0)
Hemoglobin: 8.2 g/dL — ABNORMAL LOW (ref 13.0–17.0)
Immature Granulocytes: 4 %
Lymphocytes Relative: 20 %
Lymphs Abs: 2.7 10*3/uL (ref 0.7–4.0)
MCH: 24.3 pg — ABNORMAL LOW (ref 26.0–34.0)
MCHC: 30.7 g/dL (ref 30.0–36.0)
MCV: 79.2 fL — ABNORMAL LOW (ref 80.0–100.0)
Monocytes Absolute: 0.9 10*3/uL (ref 0.1–1.0)
Monocytes Relative: 7 %
Neutro Abs: 9.1 10*3/uL — ABNORMAL HIGH (ref 1.7–7.7)
Neutrophils Relative %: 69 %
Platelet Count: 467 10*3/uL — ABNORMAL HIGH (ref 150–400)
RBC: 3.37 MIL/uL — ABNORMAL LOW (ref 4.22–5.81)
RDW: 18.9 % — ABNORMAL HIGH (ref 11.5–15.5)
WBC Count: 13.3 10*3/uL — ABNORMAL HIGH (ref 4.0–10.5)
nRBC: 0 % (ref 0.0–0.2)

## 2023-03-26 LAB — PREPARE RBC (CROSSMATCH)

## 2023-03-26 LAB — RETIC PANEL
Immature Retic Fract: 45.7 % — ABNORMAL HIGH (ref 2.3–15.9)
RBC.: 3.36 MIL/uL — ABNORMAL LOW (ref 4.22–5.81)
Retic Count, Absolute: 74.9 10*3/uL (ref 19.0–186.0)
Retic Ct Pct: 2.2 % (ref 0.4–3.1)
Reticulocyte Hemoglobin: 24.1 pg — ABNORMAL LOW (ref >27.9)

## 2023-03-26 LAB — SAMPLE TO BLOOD BANK

## 2023-03-27 ENCOUNTER — Inpatient Hospital Stay: Payer: Medicaid Other

## 2023-03-27 DIAGNOSIS — C61 Malignant neoplasm of prostate: Secondary | ICD-10-CM | POA: Diagnosis not present

## 2023-03-27 DIAGNOSIS — D649 Anemia, unspecified: Secondary | ICD-10-CM

## 2023-03-27 MED ORDER — DIPHENHYDRAMINE HCL 25 MG PO CAPS
25.0000 mg | ORAL_CAPSULE | Freq: Once | ORAL | Status: AC
  Administered 2023-03-27: 25 mg via ORAL
  Filled 2023-03-27: qty 1

## 2023-03-27 MED ORDER — ACETAMINOPHEN 325 MG PO TABS
650.0000 mg | ORAL_TABLET | Freq: Once | ORAL | Status: AC
  Administered 2023-03-27: 650 mg via ORAL
  Filled 2023-03-27: qty 2

## 2023-03-27 MED ORDER — SODIUM CHLORIDE 0.9% IV SOLUTION
250.0000 mL | INTRAVENOUS | Status: DC
Start: 2023-03-27 — End: 2023-03-27
  Administered 2023-03-27: 250 mL via INTRAVENOUS
  Filled 2023-03-27: qty 250

## 2023-03-27 NOTE — Progress Notes (Signed)
Nutrition Assessment   Reason for Assessment:   Weight loss, poor appetite   ASSESSMENT:  62 year old male with metastatic prostate cancer to bone. Past medical history of pancreatic cancer, HTN, GI bleed.  Patient on ADT, darolutamide, pluvicto 10/31.  Recent adimission.  Met with patient today during infusion of blood.  Reports decreased appetite for "awhile, few months".  Reports dysphgaia with solid foods.  Seen by GI during recent admission and felt to be related to dysmotility.  Drinking ensure 2 times a day.  Eating fruit cups, soup, mashed potatoes. Drinking whole milk, sprite, gatorade.  Reports lack of bowel movement on regular basis.  Takes miralax.  Wife does most of cooking and buying groceries.     Nutrition Focused Physical Exam:   Orbital Region: severe Buccal Region: severe Upper Arm Region: severe Thoracic and Lumbar Region: severe Temple Region: severe Clavicle Bone Region: severe Shoulder and Acromion Bone Region: severe Scapular Bone Region: severe Dorsal Hand: severe Patellar Region: severe Anterior Thigh Region: severe Posterior Calf Region: severe Edema (RD assessment): none Hair: none Eyes: reviewed Mouth: did not review Skin: dry Nails: reviewed   Medications: dulcolax, protonix, zofran, sennokot, compazine, metformin, jardiance, megace   Labs: glucose 157, K 3.0, Na 130, calcium 8.6   Anthropometrics:   Height: 69 inches Weight: 116 lb on 11/14 UBW: 151 lb 4-5 months ago per patient BMI: 17  23% weight loss in the last 4-5 months, significant   Estimated Energy Needs  Kcals: 1600-1800 Protein: 64-80g Fluid: 1600-1800 ml   NUTRITION DIAGNOSIS: Inadequate oral intake related to cancer and related treatment side effects as well as GI dysmotility as evidenced by 23% weight loss in the last 4-5 months, eating less than 75% of estimated energy needs for > 1 month and severe muscle mass loss and fat mass loss.     MALNUTRITION  DIAGNOSIS: Patient meets criteria for severe malnutrition in context of chronic illness as evidenced by 23% weight loss in the last 4-5 months, eating < 75% of estimate energy needs for > 1 month and severe muscle mass and fat mass loss.    INTERVENTION:  Continue appetite stimulant Discussed foods high in calories and protein. Handout provided Recommend boost plus/ensure plus TID to provide added calories and protein Agreeable to submitting authorization to [employer] to see if [place] Medicaid will cover oral nutrition supplements Contact information provided   MONITORING, EVALUATION, GOAL: weight trends, intake   Next Visit: Thursday, Dec 12 phone call  Pamella Samons B. Freida Busman, RD, LDN Registered Dietitian [phone]

## 2023-03-28 ENCOUNTER — Inpatient Hospital Stay: Payer: Medicaid Other

## 2023-03-28 ENCOUNTER — Inpatient Hospital Stay: Payer: Medicaid Other | Admitting: Hospice and Palliative Medicine

## 2023-03-28 ENCOUNTER — Telehealth: Payer: Self-pay

## 2023-03-28 ENCOUNTER — Inpatient Hospital Stay (HOSPITAL_BASED_OUTPATIENT_CLINIC_OR_DEPARTMENT_OTHER): Payer: Medicaid Other | Admitting: Oncology

## 2023-03-28 ENCOUNTER — Encounter: Payer: Self-pay | Admitting: Oncology

## 2023-03-28 VITALS — BP 109/74 | HR 110 | Temp 95.0°F | Resp 18 | Wt 108.7 lb

## 2023-03-28 DIAGNOSIS — R1319 Other dysphagia: Secondary | ICD-10-CM

## 2023-03-28 DIAGNOSIS — K5903 Drug induced constipation: Secondary | ICD-10-CM

## 2023-03-28 DIAGNOSIS — C61 Malignant neoplasm of prostate: Secondary | ICD-10-CM | POA: Diagnosis not present

## 2023-03-28 DIAGNOSIS — C7951 Secondary malignant neoplasm of bone: Secondary | ICD-10-CM | POA: Diagnosis not present

## 2023-03-28 DIAGNOSIS — E871 Hypo-osmolality and hyponatremia: Secondary | ICD-10-CM

## 2023-03-28 DIAGNOSIS — E43 Unspecified severe protein-calorie malnutrition: Secondary | ICD-10-CM | POA: Diagnosis not present

## 2023-03-28 DIAGNOSIS — K5900 Constipation, unspecified: Secondary | ICD-10-CM | POA: Insufficient documentation

## 2023-03-28 DIAGNOSIS — R112 Nausea with vomiting, unspecified: Secondary | ICD-10-CM

## 2023-03-28 DIAGNOSIS — R110 Nausea: Secondary | ICD-10-CM

## 2023-03-28 DIAGNOSIS — D649 Anemia, unspecified: Secondary | ICD-10-CM | POA: Diagnosis not present

## 2023-03-28 DIAGNOSIS — G893 Neoplasm related pain (acute) (chronic): Secondary | ICD-10-CM

## 2023-03-28 DIAGNOSIS — Z7189 Other specified counseling: Secondary | ICD-10-CM

## 2023-03-28 DIAGNOSIS — E876 Hypokalemia: Secondary | ICD-10-CM

## 2023-03-28 LAB — CBC WITH DIFFERENTIAL (CANCER CENTER ONLY)
Abs Immature Granulocytes: 0.17 10*3/uL — ABNORMAL HIGH (ref 0.00–0.07)
Basophils Absolute: 0 10*3/uL (ref 0.0–0.1)
Basophils Relative: 0 %
Eosinophils Absolute: 0 10*3/uL (ref 0.0–0.5)
Eosinophils Relative: 0 %
HCT: 29 % — ABNORMAL LOW (ref 39.0–52.0)
Hemoglobin: 9.4 g/dL — ABNORMAL LOW (ref 13.0–17.0)
Immature Granulocytes: 1 %
Lymphocytes Relative: 13 %
Lymphs Abs: 1.8 10*3/uL (ref 0.7–4.0)
MCH: 24.7 pg — ABNORMAL LOW (ref 26.0–34.0)
MCHC: 32.4 g/dL (ref 30.0–36.0)
MCV: 76.3 fL — ABNORMAL LOW (ref 80.0–100.0)
Monocytes Absolute: 1 10*3/uL (ref 0.1–1.0)
Monocytes Relative: 7 %
Neutro Abs: 11 10*3/uL — ABNORMAL HIGH (ref 1.7–7.7)
Neutrophils Relative %: 79 %
Platelet Count: 363 10*3/uL (ref 150–400)
RBC: 3.8 MIL/uL — ABNORMAL LOW (ref 4.22–5.81)
RDW: 19 % — ABNORMAL HIGH (ref 11.5–15.5)
WBC Count: 13.9 10*3/uL — ABNORMAL HIGH (ref 4.0–10.5)
nRBC: 0 % (ref 0.0–0.2)

## 2023-03-28 LAB — TYPE AND SCREEN
ABO/RH(D): O POS
Antibody Screen: NEGATIVE
Unit division: 0

## 2023-03-28 LAB — CMP (CANCER CENTER ONLY)
ALT: 10 U/L (ref 0–44)
AST: 26 U/L (ref 15–41)
Albumin: 3.1 g/dL — ABNORMAL LOW (ref 3.5–5.0)
Alkaline Phosphatase: 178 U/L — ABNORMAL HIGH (ref 38–126)
Anion gap: 17 — ABNORMAL HIGH (ref 5–15)
BUN: 11 mg/dL (ref 8–23)
CO2: 24 mmol/L (ref 22–32)
Calcium: 9.3 mg/dL (ref 8.9–10.3)
Chloride: 90 mmol/L — ABNORMAL LOW (ref 98–111)
Creatinine: 0.85 mg/dL (ref 0.61–1.24)
GFR, Estimated: >60 mL/min (ref >60)
Glucose, Bld: 162 mg/dL — ABNORMAL HIGH (ref 70–99)
Potassium: 2.9 mmol/L — ABNORMAL LOW (ref 3.5–5.1)
Sodium: 131 mmol/L — ABNORMAL LOW (ref 135–145)
Total Bilirubin: 1.3 mg/dL — ABNORMAL HIGH (ref <1.2)
Total Protein: 7.9 g/dL (ref 6.5–8.1)

## 2023-03-28 LAB — BPAM RBC
Blood Product Expiration Date: 202412122359
ISSUE DATE / TIME: 202411210932
Unit Type and Rh: 5100

## 2023-03-28 LAB — SAMPLE TO BLOOD BANK

## 2023-03-28 MED ORDER — SENNOSIDES-DOCUSATE SODIUM 8.6-50 MG PO TABS: 2.0000 | ORAL_TABLET | Freq: Every day | ORAL | 2 refills | Status: DC

## 2023-03-28 MED ORDER — DEXAMETHASONE SODIUM PHOSPHATE 10 MG/ML IJ SOLN
10.0000 mg | Freq: Once | INTRAMUSCULAR | Status: AC
  Administered 2023-03-28: 10 mg via INTRAVENOUS
  Filled 2023-03-28: qty 1

## 2023-03-28 MED ORDER — MEGESTROL ACETATE 40 MG PO TABS: 80.0000 mg | ORAL_TABLET | Freq: Two times a day (BID) | ORAL | 1 refills | Status: DC

## 2023-03-28 MED ORDER — POTASSIUM CHLORIDE IN NACL 20-0.9 MEQ/L-% IV SOLN
Freq: Once | INTRAVENOUS | Status: AC
  Filled 2023-03-28: qty 1000

## 2023-03-28 MED ORDER — ONDANSETRON HCL 4 MG/2ML IJ SOLN
8.0000 mg | Freq: Once | INTRAMUSCULAR | Status: AC
  Administered 2023-03-28: 8 mg via INTRAVENOUS
  Filled 2023-03-28: qty 4

## 2023-03-28 NOTE — Telephone Encounter (Signed)
Rx for Boost faxed to [facility] along with documentation

## 2023-03-28 NOTE — Assessment & Plan Note (Addendum)
Follow up with dietitian.  Recommend nutrition supplementation.  Poor oral intake, he will get 1L of NS, IV Dex 10mg  IV Zofran 8mg   Weight loss, recommend patient to stop metformin.

## 2023-03-28 NOTE — Assessment & Plan Note (Addendum)
continue Zofran 8mg  ODT Q8h PRN, compazine 10mg  Q6h PRN  IV Dex and Zofran today

## 2023-03-28 NOTE — Assessment & Plan Note (Signed)
recommend patient to take Potassium chloride daily Rx sent.  IV Kcl 20 meq today,

## 2023-03-28 NOTE — Telephone Encounter (Signed)
-----   Message from Alphonse Guild sent at 03/27/2023 10:53 AM EST ----- Dr Cathie Hoops and Lanora Manis,  [place] Medicaid is now covering adult oral nutrition supplements (ie boost/ensure) for patients.  [place] Medicaid patients are eligible for coverage after submitting a prior authorization.    In order to submit the authorization, fax the following to [facility] [phone])  Signed Rx from provider (Recommend boost plus TID)  Last chart note from MD  Dietitian note   Demographic sheet  Approval could take up to 2 weeks.  Once approved, [employer] will contact patient directly and ship out one month's supply of oral nutrition supplements.  Patient is responsible for contacting [employer] and reordering each month.     Let me know if you questions.  Joli

## 2023-03-28 NOTE — Assessment & Plan Note (Addendum)
Previous oncology history, treatment records were reviewed. He has history of metastatic prostate cancer with lymph node and bone involvement.  Currently on androgen deprivation therapy with the last dose of Eligard on 10/28/2022 Status post first-line treatment with docetaxel every 3 weeks x 4 cycles, as well as Daralutamide.  He has now developed castration resistant disease. Started on Pluvicto on 03/06/23 with Dr. Amil Amen, next pluvicto 12/10.24 Continue  Megace 80mg  BID - not complaint.

## 2023-03-28 NOTE — Assessment & Plan Note (Signed)
I had a lengthy discussion with patient. His prognosis is very poor due to very advance prostate cancer, poor nutrition status, medication noncompliance, etc.  If pluvicto is not able to control his disease, or if he is not able to tolerate treatments due to other medical problems, I think hospice/comfort care is appropriate.

## 2023-03-28 NOTE — Assessment & Plan Note (Signed)
Continue oxycodone 5mg  Q6h-8h PRN

## 2023-03-28 NOTE — Progress Notes (Signed)
Hematology/Oncology Consult Note [phone] [phone]    CHIEF COMPLAINTS/PURPOSE OF CONSULTATION:  metastatic prostate cancer  ASSESSMENT & PLAN:   Cancer Staging  Prostate cancer metastatic to bone [facility]) Staging form: Prostate, AJCC 8th Edition - Pathologic: Stage IVB (pT2, pN1, cM1, PSA: 48) - Signed by Rickard Patience, MD on 11/27/2022   Prostate cancer metastatic to bone [facility]) Previous oncology history, treatment records were reviewed. He has history of metastatic prostate cancer with lymph node and bone involvement.  Currently on androgen deprivation therapy with the last dose of Eligard on 10/28/2022 Status post first-line treatment with docetaxel every 3 weeks x 4 cycles, as well as Daralutamide.  He has now developed castration resistant disease. Started on Pluvicto on 03/06/23 with Dr. Amil Amen, next pluvicto 12/10.24 Continue  Megace 80mg  BID - not complaint.    Normocytic anemia Multifactorial, largely due to bone marrow prostate cancer involvement.  Decreased reticulocyte hemoglobin, s/p empiric IV Venofer.  PRBC transfusion PRN to keep Hb >8.5  Dysphagia He was seen by GI during recent admission, was felt to be due to dysmotility.  Continue PPI.  Follow up with GI outpatient for EGD IVF normal saline 1 L today  Protein-calorie malnutrition, severe (HCC) Follow up with dietitian.  Recommend nutrition supplementation.  Poor oral intake, he will get 1L of NS, IV Dex 10mg  IV Zofran 8mg   Weight loss, recommend patient to stop metformin.   Nausea without vomiting continue Zofran 8mg  ODT Q8h PRN, compazine 10mg  Q6h PRN  IV Dex and Zofran today  Neoplasm related pain Continue oxycodone 5mg  Q6h-8h PRN  Hypokalemia recommend patient to take Potassium chloride daily Rx sent.  IV Kcl 20 meq today,   Constipation Recommend Senna S 2 tablets daily  otc Miralax daily PRN  Goals of care, counseling/discussion I had a lengthy discussion with  patient. His prognosis is very poor due to very advance prostate cancer, poor nutrition status, medication noncompliance, etc.  If pluvicto is not able to control his disease, or if he is not able to tolerate treatments due to other medical problems, I think hospice/comfort care is appropriate.    Orders Placed This Encounter  Procedures   CBC with Differential (Cancer Center Only)    Standing Status:   Future    Standing Expiration Date:   03/27/2024   CMP (Cancer Center only)    Standing Status:   Future    Standing Expiration Date:   03/27/2024   PSA    Standing Status:   Future    Standing Expiration Date:   03/27/2024   CBC with Differential (Cancer Center Only)    Standing Status:   Future    Standing Expiration Date:   03/27/2024   CMP (Cancer Center only)    Standing Status:   Future    Standing Expiration Date:   03/27/2024   Magnesium    Standing Status:   Future    Standing Expiration Date:   03/27/2024   Sample to Blood Bank    Standing Status:   Future    Standing Expiration Date:   03/27/2024   Sample to Blood Bank    Standing Status:   Future    Standing Expiration Date:   03/27/2024   Follow-up  3 days lab NP IVF 2 weeks lab MD IVF All questions were answered. The patient knows to call the clinic with any problems, questions or concerns.  Rickard Patience, MD, PhD [facility] Hematology Oncology 03/28/2023    HISTORY  OF PRESENTING ILLNESS:  Louis Gardner 62 y.o. male presents to establish care for metastatic prostate cancer I have reviewed his chart and materials related to his cancer extensively and collaborated history with the patient. Summary of oncologic history is as follows: Oncology History  Prostate cancer metastatic to bone [facility])  05/29/2021 Initial Diagnosis   Prostate cancer metastatic to bone (HCC)  03/19/21 - CT A/P with contrast performed to follow up on pancreatic mass, with near resolution of pancreatic uncinate process. Bulky multistation  abdominopelvic lymphadenopathy identified (e.g. left para-aortic node measuring 2.1cm and right common iliac node measuring 1.4cm). No concerning osseous lesions identified. Enlarged, heterogeneously enhancing prostate gland. 04/17/21 - PSA 48.83 05/29/21 - retroperitoneal lymph node core needle biopsy, pathology consistent with metastatic prostate adenocarcinoma. 06/27/21 - PSMA-PET scan with "widely metastatic osseous disease as well as avid adenopathy extending from the pelvis to the left supraclavicular region consistent with metastatic prostate cancer. Noting that there is one single enlarged retroperitoneal node with low/minimal tracer avidity measuring up to 4 cm."  07/31/2022 PSA 406.39 08/27/21- 12/24/2021, 4 cycles. -docetaxel chemotherapy 75mg /m2 mid April 2023- darolutamide per ARASENS trial in mid April 2023  01/14/2022 PSMA showed marked interval improvement, tracer avid osseous disease now with only 6 lesions that demonstrating mild residual focal avidity  01/29/2023 PSA nadir  0.5 08/21/2022 PSA 3.32 09/25/2022, he developed castration resistant disease -Diffuse sclerotic lesions throughout the skeleton some of which demonstrate Pylarify uptake, as described in the body of the report, concerning for Pylarify avid metastatic disease. Uptake of the Pylarify avid lesions is overall similar to that of the liver.  -New uptake in the right posterior prostate and right seminal vesicle, concerning for malignancy.   Patient was recommended to stop darolutamide.  He was offered Pluvicto versus salvage chemotherapy with cabazitaxel.  He was undecided. 10/28/2022 PSA 18.92.         11/23/2022 Tumor Marker   PSA 117.5   11/23/2022 Imaging   Patient presented emergency room for evaluation of chest pain for 2 days. CT chest angiogram PE protocol 1. No evidence for acute pulmonary embolus. 2. Diffusely abnormal bones with multifocal areas of abnormal increased sclerosis noted throughout the  thoracic spine, sternum and ribs concerning for diffuse osseous metastasis. 3. Prominent mediastinal and hilar lymph nodes are identified. These are nonspecific and may be reactive. Nodal metastasis not excluded. 4. Focal area of bronchiectasis with adjacent peripheral tree-in-bud nodularity in the right upper lobe. This is favored to represent sequelae of prior infection or inflammation. 5. Bilateral adrenal gland hyperplasia. 6. Cholelithiasis. 7. Irregular subpleural nodule in the anterior left upper lobe measures 4 mm. 8.  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 S/P 1 pleuvicto treatment on 03/06/23  Today patient was accompanied by his partner.  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  He is not complaint with Megace 80mg  BID. Appetite is poor. Also continues to have dysphagia to solid food.  He has lost 8 pounds.  He takes oxycodone PRN and pain is controlled well.  +constipation. He passes gas.       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   Seizure-like activity (HCC) 12/02/2017   Sleep apnea      SURGICAL HISTORY: Past Surgical History:  Procedure Laterality Date   COLONOSCOPY     COLONOSCOPY WITH PROPOFOL N/A 12/09/2022   Procedure: COLONOSCOPY WITH PROPOFOL;  Surgeon: Wyline Mood, MD;  Location: [facility] ENDOSCOPY;  Service: Gastroenterology;  Laterality: N/A;   ESOPHAGOGASTRODUODENOSCOPY (EGD) WITH PROPOFOL N/A 03/19/2016   Procedure: ESOPHAGOGASTRODUODENOSCOPY (EGD) WITH PROPOFOL;  Surgeon: Christena Deem, MD;  Location: [facility] ENDOSCOPY;  Service: Endoscopy;  Laterality: N/A;   IR BONE MARROW BIOPSY & ASPIRATION  01/24/2023   IR CT BONE TROCAR/NEEDLE BIOPSY DEEP  01/24/2023    SOCIAL HISTORY: Social History   Socioeconomic History   Marital status: Single    Spouse name: Not on file   Number of children: Not on file   Years of education: Not on file   Highest education level: Not on file  Occupational History   Not on file  Tobacco Use   Smoking status: Every Day    Current packs/day: 0.25    Average packs/day: 0.3 packs/day for 48.0 years (12.0 ttl pk-yrs)    Types: Cigarettes    Passive exposure: Current   Smokeless tobacco: Never  Vaping Use   Vaping status: Never Used  Substance and Sexual Activity   Alcohol use: Yes    Alcohol/week: 6.0 standard drinks of alcohol    Types: 6 Cans of beer per week   Drug use: No   Sexual activity: Not on file  Other Topics Concern   Not on file  Social History Narrative   Not on file   Social Determinants of Health   Financial Resource Strain: Low Risk  (11/27/2022)   Overall Financial Resource Strain (CARDIA)    Difficulty of Paying Living Expenses: Not very hard  Food Insecurity: No Food Insecurity (01/18/2023)   Hunger Vital Sign    Worried About Running Out of Food in the Last Year: Never true    Ran Out of Food in the Last Year: Never true  Recent Concern: Food Insecurity - Food Insecurity Present (11/27/2022)   Hunger Vital Sign    Worried About Running Out of Food in the Last Year: Sometimes true    Ran Out  of Food in the Last Year: Never true  Transportation Needs: No Transportation Needs (01/18/2023)   PRAPARE - Administrator, Civil Service (Medical): No    Lack of Transportation (Non-Medical): No  Physical Activity: Not on file  Stress: No Stress Concern Present (11/27/2022)   [employer] of Occupational Health - Occupational Stress Questionnaire    Feeling of Stress : Only a little  Social Connections: Not on file  Intimate Partner Violence: Not At Risk (01/18/2023)   Humiliation, Afraid, Rape, and Kick questionnaire    Fear of Current or Ex-Partner: No    Emotionally Abused: No    Physically Abused: No    Sexually Abused: No    FAMILY HISTORY: History reviewed. No pertinent family history.  ALLERGIES:  has No Known Allergies.  MEDICATIONS:  Current Outpatient Medications  Medication Sig Dispense Refill   amLODipine (NORVASC) 10 MG tablet Take 10 mg by mouth daily.     bisacodyl (DULCOLAX) 10 MG suppository Place 1 suppository (10 mg total) rectally daily as needed for up to 12 doses for moderate constipation. 12 suppository 0   feeding supplement (ENSURE ENLIVE / ENSURE PLUS) LIQD Take 237 mLs by mouth 3 (three) times daily between meals. 237 mL 12   JARDIANCE 10 MG TABS tablet Take 10 mg by mouth daily.     magnesium hydroxide (MILK OF MAGNESIA) 400 MG/5ML suspension Take 5 mLs by mouth daily as needed for mild constipation.     metFORMIN (GLUCOPHAGE) 500 MG tablet Take 1,000 mg by mouth 2 (two) times daily with a meal.     nystatin (MYCOSTATIN) 100000 UNIT/ML suspension Use as directed 5 mLs (500,000 Units total) in the mouth or throat 4 (four) times daily. 473 mL 0   ondansetron (ZOFRAN-ODT) 8 MG disintegrating tablet Take 1 tablet (8 mg total) by mouth every 8 (eight) hours as needed for nausea. 60 tablet 1   oxyCODONE (ROXICODONE) 5 MG immediate release tablet Take 1 tablet (5 mg total) by mouth every 6 (six) hours as needed for breakthrough pain or severe pain  (pain score 7-10). 90 tablet 0   pantoprazole (PROTONIX) 40 MG tablet Take 1 tablet (40 mg total) by mouth daily. 90 tablet 1   polyethylene glycol (MIRALAX / GLYCOLAX) 17 g packet Take 17 g by mouth daily.     potassium chloride SA (KLOR-CON M) 20 MEQ tablet Take 2 tablets (40 mEq total) by mouth daily. 60 tablet 0   pravastatin (PRAVACHOL) 20 MG tablet Take 20 mg by mouth daily.     prochlorperazine (COMPAZINE) 10 MG tablet TAKE 1 TABLET(10 MG) BY MOUTH EVERY 6 HOURS AS NEEDED FOR NAUSEA OR VOMITING 90 tablet 0   senna-docusate (SENOKOT-S) 8.6-50 MG tablet Take 1 tablet by mouth daily as needed for up to 30 doses for mild constipation. 30 tablet 0   senna-docusate (SENOKOT-S) 8.6-50 MG tablet Take 1 tablet by mouth daily as needed for up to 30 doses for mild constipation. 30 tablet 0   tamsulosin (FLOMAX) 0.4 MG CAPS capsule Take 1 capsule by mouth daily.     ciprofloxacin (CIPRO) 500 MG tablet Take by mouth. (Patient not taking: Reported on 03/28/2023)     megestrol (MEGACE) 40 MG tablet Take 2 tablets (80 mg total) by mouth 2 (two) times daily. 120 tablet 1   No current facility-administered medications for this visit.    Review of Systems  Constitutional:  Positive for appetite change, fatigue and unexpected weight change. Negative for chills and fever.  HENT:   Negative for hearing loss and voice change.   Eyes:  Negative for eye problems and icterus.  Respiratory:  Negative for chest tightness, cough and shortness of breath.   Cardiovascular:  Negative for chest pain and leg swelling.  Gastrointestinal:  Negative for abdominal distention and abdominal pain.       Dysphagia  Endocrine: Negative for hot flashes.  Genitourinary:  Negative for difficulty urinating, dysuria and frequency.   Musculoskeletal:  Positive for back pain. Negative for arthralgias.  Skin:  Negative for itching and rash.  Neurological:  Negative for light-headedness and numbness.  Hematological:  Negative for  adenopathy. Does not bruise/bleed easily.  Psychiatric/Behavioral:  Negative for confusion.      PHYSICAL EXAMINATION: ECOG PERFORMANCE  STATUS: 1 - Symptomatic but completely ambulatory  Vitals:   03/28/23 0859  BP: 109/74  Pulse: (!) 110  Resp: 18  Temp: (!) 95 F (35 C)  SpO2: 98%   Filed Weights   03/28/23 0859  Weight: 108 lb 11.2 oz (49.3 kg)    Physical Exam Constitutional:      General: He is not in acute distress.    Appearance: He is ill-appearing. He is not diaphoretic.  HENT:     Head: Normocephalic and atraumatic.     Nose: Nose normal.     Mouth/Throat:     Pharynx: No oropharyngeal exudate.  Eyes:     General: No scleral icterus.    Pupils: Pupils are equal, round, and reactive to light.  Cardiovascular:     Rate and Rhythm: Normal rate and regular rhythm.     Heart sounds: No murmur heard. Pulmonary:     Effort: Pulmonary effort is normal. No respiratory distress.     Breath sounds: No rales.  Chest:     Chest wall: No tenderness.  Abdominal:     General: There is no distension.     Palpations: Abdomen is soft.     Tenderness: There is no abdominal tenderness.  Musculoskeletal:        General: Normal range of motion.     Cervical back: Normal range of motion and neck supple.  Skin:    General: Skin is warm and dry.     Findings: No erythema.  Neurological:     Mental Status: He is alert and oriented to person, place, and time.     Cranial Nerves: No cranial nerve deficit.     Motor: No abnormal muscle tone.     Coordination: Coordination normal.  Psychiatric:        Mood and Affect: Affect normal.      LABORATORY DATA:  I have reviewed the data as listed    Latest Ref Rng & Units 03/28/2023    8:41 AM 03/26/2023    9:35 AM 03/20/2023   10:12 AM  CBC  WBC 4.0 - 10.5 K/uL 13.9  13.3  12.7   Hemoglobin 13.0 - 17.0 g/dL 9.4  8.2  7.8   Hematocrit 39.0 - 52.0 % 29.0  26.7  24.7   Platelets 150 - 400 K/uL 363  467  432       Latest  Ref Rng & Units 03/28/2023    8:41 AM 03/26/2023    9:35 AM 03/20/2023   10:12 AM  CMP  Glucose 70 - 99 mg/dL 161  096  045   BUN 8 - 23 mg/dL 11  10  13    Creatinine 0.61 - 1.24 mg/dL 4.09  8.11  9.14   Sodium 135 - 145 mmol/L 131  131  128   Potassium 3.5 - 5.1 mmol/L 2.9  4.5  3.9   Chloride 98 - 111 mmol/L 90  92  93   CO2 22 - 32 mmol/L 24  23  24    Calcium 8.9 - 10.3 mg/dL 9.3  9.6  9.0   Total Protein 6.5 - 8.1 g/dL 7.9  7.9  7.3   Total Bilirubin <1.2 mg/dL 1.3  0.5  0.4   Alkaline Phos 38 - 126 U/L 178  155  121   AST 15 - 41 U/L 26  20  16    ALT 0 - 44 U/L 10  8  10       RADIOGRAPHIC STUDIES: I  have personally reviewed the radiological images as listed and agreed with the findings in the report. CT ABDOMEN PELVIS W CONTRAST  Result Date: 03/17/2023 CLINICAL DATA:  Bowel obstruction suspected, constipation for 10 days. EXAM: CT ABDOMEN AND PELVIS WITH CONTRAST TECHNIQUE: Multidetector CT imaging of the abdomen and pelvis was performed using the standard protocol following bolus administration of intravenous contrast. RADIATION DOSE REDUCTION: This exam was performed according to the departmental dose-optimization program which includes automated exposure control, adjustment of the mA and/or kV according to patient size and/or use of iterative reconstruction technique. CONTRAST:  OMNIPAQUE IOHEXOL 300 MG/ML  SOLN COMPARISON:  01/07/2023 FINDINGS: Lower chest: Centrilobular micro nodules and tree-in-bud opacities in the lower lungs. Mild interlobular septal thickening. Hepatobiliary: Increased size and number of multifocal rounded hypoattenuating lesions throughout the liver. For example a 2.4 cm lesion in the anterior left hepatic lobe (series 2/image 19) previously measured 1.4 cm. Cholelithiasis. No biliary dilation. Pancreas: No ductal dilation or inflammatory stranding. Cystic focus in the uncinate process is unchanged in size measuring 8 mm. A 8 mm adjacent hyperenhancing  focus is also unchanged. Spleen: Unremarkable. Adrenals/Urinary Tract: Stable adrenal gland. No urinary calculi or hydronephrosis. Foley catheter in the nondistended bladder. Stomach/Bowel: Large colonic stool load with large stool ball in the rectum. Colonic diverticulosis without diverticulitis. No evidence of obstruction or bowel wall thickening. Stomach and appendix are within normal limits. Vascular/Lymphatic: No significant vascular findings are present. No enlarged abdominal or pelvic lymph nodes. Reproductive: No acute abnormality. Other: No free intraperitoneal fluid or air. Fat containing umbilical hernia. Musculoskeletal: Diffuse heterogenous sclerotic appearance of the bone marrow. Postsurgical changes anterior abdominal wall. IMPRESSION: 1. Large colonic stool load with large stool ball in the rectum consistent with constipation. 2. Increased size and number of hepatic metastases. 3. Diffuse heterogenous sclerotic appearance of the bone marrow, consistent with metastatic disease. 4. Centrilobular micro nodules and tree-in-bud opacities in the lower lungs, likely due to small airway infection/inflammation. 5. Cholelithiasis. 6. Stable cystic focus in the uncinate process of the pancreas and adjacent hyperenhancing focus. Continued attention on follow-up. Electronically Signed   By: Minerva Fester M.D.   On: 03/17/2023 20:10   NM PLUVICTO ADMINISTRATION  Result Date: 03/06/2023 CLINICAL DATA:  62 year old male with metastatic prostate carcinoma. Castrate resistant carcinoma. Trial taxane chemotherapy. Progression with bone metastasis. PSA elevated 350. Anemia noted prior to therapy. Foley catheter in place. Chronic urinary retention related to BPH in prostate cancer EXAM: NUCLEAR MEDICINE PLUVICTO INJECTION TECHNIQUE: Infusion: The nuclear medicine technologist and I personally verified the dose activity to be delivered as specified in the written directive, and verified the patient identification  via 2 separate methods. Initial flush of the intravenous catheter was performed was sterile saline. The dose syringe was connected to the catheter and the Lu-177 Pluvicto administered over a 1 to 10 min infusion. Single 10 cc lushes with normal saline follow the dose. No complications were noted. The entire IV tubing, venocatheter, stopcock and syringes was removed in total, placed in a disposal bag and sent for assay of the residual activity, which will be reported at a later time in our EMR by the physics staff. Pressure was applied to the venipuncture site, and a compression bandage placed. Patient monitored for 1 hour following infusion. Radiation Safety personnel were present to perform the discharge survey, as detailed on their documentation. After a short period of observation, the patient had his IV removed. RADIOPHARMACEUTICALS:  204.92 microcuries Lu-177 PLUVICTO FINDINGS: Current Infusion: 1  Planned Infusions: 6 Patient presented to nuclear medicine for treatment. The patient's most recent blood counts were reviewed and remains a adequate candidate to proceed with Lu-177 Pluvicto. Patient hemoglobin equal 8.2 increased from 7.7. Recent transfusion. Creatinine normal.  PSA equal 352 potassium is improved to 4.3 from 2.7. The patient was situated in an infusion suite with a contact barrier placed under the arm. Intravenous access was established, using sterile technique, and a normal saline infusion from a syringe was started. Micro-dosimetry: The prescribed radiation activity was assayed and confirmed to be within specified tolerance. IMPRESSION: Current Infusion: 1 Planned Infusions: 6 The patient tolerated the infusion well. The patient will return in 6 weeks for ongoing care. Electronically Signed   By: Genevive Bi M.D.   On: 03/06/2023 16:39

## 2023-03-28 NOTE — Assessment & Plan Note (Signed)
He was seen by GI during recent admission, was felt to be due to dysmotility.  Continue PPI.  Follow up with GI outpatient for EGD IVF normal saline 1 L today

## 2023-03-28 NOTE — Assessment & Plan Note (Signed)
Recommend Senna S 2 tablets daily  otc Miralax daily PRN

## 2023-03-28 NOTE — Assessment & Plan Note (Addendum)
Multifactorial, largely due to bone marrow prostate cancer involvement.  Decreased reticulocyte hemoglobin, s/p empiric IV Venofer.  PRBC transfusion PRN to keep Hb >8.5

## 2023-03-29 ENCOUNTER — Inpatient Hospital Stay
Admission: EM | Admit: 2023-03-29 | Discharge: 2023-04-01 | DRG: 947 | Disposition: A | Discharge status: Hospice | Payer: Medicaid Other | Attending: Internal Medicine | Admitting: Internal Medicine

## 2023-03-29 ENCOUNTER — Emergency Department: Payer: Medicaid Other

## 2023-03-29 ENCOUNTER — Other Ambulatory Visit: Payer: Self-pay

## 2023-03-29 DIAGNOSIS — Z681 Body mass index (BMI) 19 or less, adult: Secondary | ICD-10-CM

## 2023-03-29 DIAGNOSIS — E861 Hypovolemia: Secondary | ICD-10-CM | POA: Diagnosis present

## 2023-03-29 DIAGNOSIS — C7952 Secondary malignant neoplasm of bone marrow: Secondary | ICD-10-CM | POA: Diagnosis present

## 2023-03-29 DIAGNOSIS — C775 Secondary and unspecified malignant neoplasm of intrapelvic lymph nodes: Secondary | ICD-10-CM | POA: Diagnosis present

## 2023-03-29 DIAGNOSIS — R0789 Other chest pain: Secondary | ICD-10-CM | POA: Diagnosis present

## 2023-03-29 DIAGNOSIS — E119 Type 2 diabetes mellitus without complications: Secondary | ICD-10-CM | POA: Diagnosis present

## 2023-03-29 DIAGNOSIS — C61 Malignant neoplasm of prostate: Secondary | ICD-10-CM | POA: Diagnosis present

## 2023-03-29 DIAGNOSIS — M546 Pain in thoracic spine: Secondary | ICD-10-CM | POA: Diagnosis present

## 2023-03-29 DIAGNOSIS — E785 Hyperlipidemia, unspecified: Secondary | ICD-10-CM | POA: Diagnosis present

## 2023-03-29 DIAGNOSIS — N401 Enlarged prostate with lower urinary tract symptoms: Secondary | ICD-10-CM | POA: Diagnosis present

## 2023-03-29 DIAGNOSIS — F32A Depression, unspecified: Secondary | ICD-10-CM | POA: Diagnosis present

## 2023-03-29 DIAGNOSIS — E871 Hypo-osmolality and hyponatremia: Principal | ICD-10-CM | POA: Diagnosis present

## 2023-03-29 DIAGNOSIS — F17210 Nicotine dependence, cigarettes, uncomplicated: Secondary | ICD-10-CM | POA: Diagnosis present

## 2023-03-29 DIAGNOSIS — D72828 Other elevated white blood cell count: Secondary | ICD-10-CM | POA: Diagnosis present

## 2023-03-29 DIAGNOSIS — G893 Neoplasm related pain (acute) (chronic): Principal | ICD-10-CM | POA: Diagnosis present

## 2023-03-29 DIAGNOSIS — Z7984 Long term (current) use of oral hypoglycemic drugs: Secondary | ICD-10-CM

## 2023-03-29 DIAGNOSIS — R509 Fever, unspecified: Secondary | ICD-10-CM | POA: Diagnosis present

## 2023-03-29 DIAGNOSIS — E876 Hypokalemia: Secondary | ICD-10-CM | POA: Diagnosis present

## 2023-03-29 DIAGNOSIS — D630 Anemia in neoplastic disease: Secondary | ICD-10-CM | POA: Diagnosis present

## 2023-03-29 DIAGNOSIS — Z8507 Personal history of malignant neoplasm of pancreas: Secondary | ICD-10-CM

## 2023-03-29 DIAGNOSIS — C7801 Secondary malignant neoplasm of right lung: Secondary | ICD-10-CM | POA: Diagnosis present

## 2023-03-29 DIAGNOSIS — C7802 Secondary malignant neoplasm of left lung: Secondary | ICD-10-CM | POA: Diagnosis present

## 2023-03-29 DIAGNOSIS — C7951 Secondary malignant neoplasm of bone: Secondary | ICD-10-CM | POA: Diagnosis present

## 2023-03-29 DIAGNOSIS — E43 Unspecified severe protein-calorie malnutrition: Secondary | ICD-10-CM | POA: Diagnosis present

## 2023-03-29 DIAGNOSIS — I10 Essential (primary) hypertension: Secondary | ICD-10-CM | POA: Diagnosis present

## 2023-03-29 DIAGNOSIS — R1310 Dysphagia, unspecified: Secondary | ICD-10-CM | POA: Diagnosis present

## 2023-03-29 DIAGNOSIS — C787 Secondary malignant neoplasm of liver and intrahepatic bile duct: Secondary | ICD-10-CM | POA: Diagnosis present

## 2023-03-29 DIAGNOSIS — R64 Cachexia: Secondary | ICD-10-CM | POA: Diagnosis present

## 2023-03-29 DIAGNOSIS — R079 Chest pain, unspecified: Principal | ICD-10-CM | POA: Diagnosis present

## 2023-03-29 DIAGNOSIS — Z79899 Other long term (current) drug therapy: Secondary | ICD-10-CM

## 2023-03-29 DIAGNOSIS — E878 Other disorders of electrolyte and fluid balance, not elsewhere classified: Secondary | ICD-10-CM | POA: Diagnosis present

## 2023-03-29 LAB — CBG MONITORING, ED
Glucose-Capillary: 187 mg/dL — ABNORMAL HIGH (ref 70–99)
Glucose-Capillary: 161 mg/dL — ABNORMAL HIGH (ref 70–99)
Glucose-Capillary: 132 mg/dL — ABNORMAL HIGH (ref 70–99)

## 2023-03-29 LAB — HEMOGLOBIN A1C
Hgb A1c MFr Bld: 6.5 % — ABNORMAL HIGH (ref 4.8–5.6)
Mean Plasma Glucose: 139.85 mg/dL

## 2023-03-29 LAB — COMPREHENSIVE METABOLIC PANEL
ALT: 12 U/L (ref 0–44)
AST: 31 U/L (ref 15–41)
Albumin: 3 g/dL — ABNORMAL LOW (ref 3.5–5.0)
Alkaline Phosphatase: 193 U/L — ABNORMAL HIGH (ref 38–126)
Anion gap: 14 (ref 5–15)
BUN: 9 mg/dL (ref 8–23)
CO2: 24 mmol/L (ref 22–32)
Calcium: 9.5 mg/dL (ref 8.9–10.3)
Chloride: 87 mmol/L — ABNORMAL LOW (ref 98–111)
Creatinine, Ser: 0.66 mg/dL (ref 0.61–1.24)
GFR, Estimated: >60 mL/min (ref >60)
Glucose, Bld: 260 mg/dL — ABNORMAL HIGH (ref 70–99)
Potassium: 3 mmol/L — ABNORMAL LOW (ref 3.5–5.1)
Sodium: 125 mmol/L — ABNORMAL LOW (ref 135–145)
Total Bilirubin: 0.8 mg/dL (ref <1.2)
Total Protein: 8.1 g/dL (ref 6.5–8.1)

## 2023-03-29 LAB — CBC
HCT: 28.8 % — ABNORMAL LOW (ref 39.0–52.0)
Hemoglobin: 9.3 g/dL — ABNORMAL LOW (ref 13.0–17.0)
MCH: 24.5 pg — ABNORMAL LOW (ref 26.0–34.0)
MCHC: 32.3 g/dL (ref 30.0–36.0)
MCV: 76 fL — ABNORMAL LOW (ref 80.0–100.0)
Platelets: 393 10*3/uL (ref 150–400)
RBC: 3.79 MIL/uL — ABNORMAL LOW (ref 4.22–5.81)
RDW: 18.5 % — ABNORMAL HIGH (ref 11.5–15.5)
WBC: 15.1 10*3/uL — ABNORMAL HIGH (ref 4.0–10.5)
nRBC: 0 % (ref 0.0–0.2)

## 2023-03-29 LAB — MAGNESIUM: Magnesium: 1.7 mg/dL (ref 1.7–2.4)

## 2023-03-29 LAB — BASIC METABOLIC PANEL
Anion gap: 11 (ref 5–15)
BUN: 6 mg/dL — ABNORMAL LOW (ref 8–23)
CO2: 23 mmol/L (ref 22–32)
Calcium: 8.5 mg/dL — ABNORMAL LOW (ref 8.9–10.3)
Chloride: 92 mmol/L — ABNORMAL LOW (ref 98–111)
Creatinine, Ser: 0.46 mg/dL — ABNORMAL LOW (ref 0.61–1.24)
GFR, Estimated: >60 mL/min (ref >60)
Glucose, Bld: 152 mg/dL — ABNORMAL HIGH (ref 70–99)
Potassium: 3.3 mmol/L — ABNORMAL LOW (ref 3.5–5.1)
Sodium: 126 mmol/L — ABNORMAL LOW (ref 135–145)

## 2023-03-29 LAB — TROPONIN I (HIGH SENSITIVITY): Troponin I (High Sensitivity): 7 ng/L (ref <18)

## 2023-03-29 LAB — LIPASE, BLOOD: Lipase: 22 U/L (ref 11–51)

## 2023-03-29 LAB — PHOSPHORUS: Phosphorus: 2.1 mg/dL — ABNORMAL LOW (ref 2.5–4.6)

## 2023-03-29 MED ORDER — ONDANSETRON HCL 4 MG/2ML IJ SOLN
4.0000 mg | Freq: Once | INTRAMUSCULAR | Status: AC
  Administered 2023-03-29: 4 mg via INTRAVENOUS
  Filled 2023-03-29: qty 2

## 2023-03-29 MED ORDER — PRAVASTATIN SODIUM 20 MG PO TABS
20.0000 mg | ORAL_TABLET | Freq: Every day | ORAL | Status: DC
  Administered 2023-03-29 – 2023-03-31 (×3): 20 mg via ORAL
  Filled 2023-03-29 (×3): qty 1

## 2023-03-29 MED ORDER — MEGESTROL ACETATE 20 MG PO TABS
80.0000 mg | ORAL_TABLET | Freq: Two times a day (BID) | ORAL | Status: DC
  Administered 2023-03-29 – 2023-04-01 (×6): 80 mg via ORAL
  Filled 2023-03-29 (×8): qty 4

## 2023-03-29 MED ORDER — BISACODYL 10 MG RE SUPP: 10.0000 mg | Freq: Every day | RECTAL | Status: DC | PRN

## 2023-03-29 MED ORDER — INSULIN ASPART 100 UNIT/ML IJ SOLN
0.0000 [IU] | Freq: Three times a day (TID) | INTRAMUSCULAR | Status: DC
  Administered 2023-03-29 (×2): 3 [IU] via SUBCUTANEOUS
  Administered 2023-03-30: 5 [IU] via SUBCUTANEOUS
  Administered 2023-03-30: 2 [IU] via SUBCUTANEOUS
  Administered 2023-03-30 – 2023-03-31 (×2): 3 [IU] via SUBCUTANEOUS
  Administered 2023-03-31: 5 [IU] via SUBCUTANEOUS
  Administered 2023-03-31 – 2023-04-01 (×2): 3 [IU] via SUBCUTANEOUS
  Filled 2023-03-29 (×9): qty 1

## 2023-03-29 MED ORDER — POTASSIUM & SODIUM PHOSPHATES 280-160-250 MG PO PACK
2.0000 | PACK | Freq: Once | ORAL | Status: AC
  Administered 2023-03-29: 2 via ORAL
  Filled 2023-03-29: qty 2

## 2023-03-29 MED ORDER — POTASSIUM CHLORIDE CRYS ER 20 MEQ PO TBCR
40.0000 meq | EXTENDED_RELEASE_TABLET | Freq: Once | ORAL | Status: AC
  Administered 2023-03-29: 40 meq via ORAL
  Filled 2023-03-29: qty 2

## 2023-03-29 MED ORDER — EMPAGLIFLOZIN 10 MG PO TABS: 10.0000 mg | ORAL_TABLET | Freq: Every day | ORAL | Status: DC

## 2023-03-29 MED ORDER — IOHEXOL 350 MG/ML SOLN
75.0000 mL | Freq: Once | INTRAVENOUS | Status: AC | PRN
  Administered 2023-03-29: 75 mL via INTRAVENOUS

## 2023-03-29 MED ORDER — SODIUM CHLORIDE 1 G PO TABS
2.0000 g | ORAL_TABLET | Freq: Once | ORAL | Status: AC
  Administered 2023-03-29: 2 g via ORAL
  Filled 2023-03-29: qty 2

## 2023-03-29 MED ORDER — MORPHINE SULFATE (PF) 4 MG/ML IV SOLN
4.0000 mg | Freq: Once | INTRAVENOUS | Status: AC
  Administered 2023-03-29: 4 mg via INTRAVENOUS
  Filled 2023-03-29: qty 1

## 2023-03-29 MED ORDER — SODIUM CHLORIDE 0.9 % IV SOLN: INTRAVENOUS | Status: AC

## 2023-03-29 MED ORDER — METFORMIN HCL 500 MG PO TABS: 1000.0000 mg | ORAL_TABLET | Freq: Two times a day (BID) | ORAL | Status: DC

## 2023-03-29 MED ORDER — PANTOPRAZOLE SODIUM 40 MG PO TBEC
40.0000 mg | DELAYED_RELEASE_TABLET | Freq: Every day | ORAL | Status: DC
  Administered 2023-03-29 – 2023-04-01 (×4): 40 mg via ORAL
  Filled 2023-03-29 (×4): qty 1

## 2023-03-29 MED ORDER — ENOXAPARIN SODIUM 40 MG/0.4ML IJ SOSY
40.0000 mg | PREFILLED_SYRINGE | INTRAMUSCULAR | Status: DC
  Administered 2023-03-29 – 2023-03-31 (×3): 40 mg via SUBCUTANEOUS
  Filled 2023-03-29 (×3): qty 0.4

## 2023-03-29 MED ORDER — POTASSIUM CHLORIDE CRYS ER 20 MEQ PO TBCR
40.0000 meq | EXTENDED_RELEASE_TABLET | ORAL | Status: AC
  Administered 2023-03-29 (×2): 40 meq via ORAL
  Filled 2023-03-29 (×2): qty 2

## 2023-03-29 MED ORDER — ENSURE ENLIVE PO LIQD
237.0000 mL | Freq: Three times a day (TID) | ORAL | Status: DC
  Administered 2023-03-29 – 2023-04-01 (×10): 237 mL via ORAL

## 2023-03-29 MED ORDER — POLYETHYLENE GLYCOL 3350 17 G PO PACK
17.0000 g | PACK | Freq: Every day | ORAL | Status: DC
  Administered 2023-03-29 – 2023-04-01 (×4): 17 g via ORAL
  Filled 2023-03-29 (×4): qty 1

## 2023-03-29 MED ORDER — OXYCODONE HCL 5 MG PO TABS
5.0000 mg | ORAL_TABLET | Freq: Four times a day (QID) | ORAL | Status: DC | PRN
  Administered 2023-03-29 – 2023-04-01 (×3): 5 mg via ORAL
  Filled 2023-03-29 (×4): qty 1

## 2023-03-29 MED ORDER — ONDANSETRON HCL 4 MG/2ML IJ SOLN: 4.0000 mg | Freq: Four times a day (QID) | INTRAMUSCULAR | Status: DC | PRN

## 2023-03-29 MED ORDER — AMLODIPINE BESYLATE 10 MG PO TABS
10.0000 mg | ORAL_TABLET | Freq: Every day | ORAL | Status: DC
  Administered 2023-03-29 – 2023-04-01 (×4): 10 mg via ORAL
  Filled 2023-03-29: qty 1
  Filled 2023-03-29: qty 2
  Filled 2023-03-29 (×2): qty 1

## 2023-03-29 MED ORDER — SENNOSIDES-DOCUSATE SODIUM 8.6-50 MG PO TABS: 1.0000 | ORAL_TABLET | Freq: Every day | ORAL | Status: DC | PRN

## 2023-03-29 MED ORDER — SODIUM CHLORIDE 0.9 % IV BOLUS
1000.0000 mL | Freq: Once | INTRAVENOUS | Status: AC
  Administered 2023-03-29: 1000 mL via INTRAVENOUS

## 2023-03-29 MED ORDER — TAMSULOSIN HCL 0.4 MG PO CAPS: 0.4000 mg | ORAL_CAPSULE | Freq: Every day | ORAL | Status: DC

## 2023-03-29 NOTE — ED Notes (Signed)
Taken to CT.

## 2023-03-29 NOTE — H&P (Addendum)
History and Physical    Louis Gardner ZOX:[id number] DOB: [birth year] DOA: 03/29/2023  PCP: [employer] (Confirm with patient/family/NH records and if not entered, this has to be entered at [facility] point of entry) Patient coming from: Home  I have personally briefly reviewed patient's old medical records in [facility]  Chief Complaint: Multiple complaints, trouble swallowing, feeling weak, chest pain.  HPI: Louis Gardner is a 62 y.o. 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  Patient developed dysphagia in recent weeks, has been having trouble swallowing solid food, but not liquid or water.  Symptoms has gradually getting worse, and patient "only eating less and resulted in gradual weight loss.  Denied any abdominal pain, no nausea.  No diarrhea.  Last night patient started develop left-sided rib cage pain, dull ache, constant, no exacerbating or relieving factors.  He felt generalized weakness but denied any fall no lightheadedness vision changes numbness weakness of any of the limbs.  ED Course: Afebrile, no tachycardia nonhypotensive nonhypoxic.  CTA chest negative for PE but again demonstrated multiple bilateral metastatic lesions in the lungs and again liver metastasis lesions.  Troponin negative x 2, EKG showed normal ST-T.  Blood work showed sodium 125, potassium 3.0 creatinine 0.6, chloride 87.  Patient was given IV bolus 1000 x 1 Zofran.  Review of Systems: As per HPI otherwise 14 point review of systems negative.    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   Seizure-like activity (HCC) 12/02/2017   Sleep apnea     Past Surgical History:  Procedure Laterality Date   COLONOSCOPY     COLONOSCOPY WITH PROPOFOL N/A 12/09/2022   Procedure: COLONOSCOPY WITH PROPOFOL;  Surgeon: Wyline Mood, MD;  Location: [facility] ENDOSCOPY;  Service: Gastroenterology;  Laterality: N/A;   ESOPHAGOGASTRODUODENOSCOPY (EGD) WITH PROPOFOL N/A 03/19/2016   Procedure: ESOPHAGOGASTRODUODENOSCOPY (EGD) WITH PROPOFOL;  Surgeon: Christena Deem, MD;  Location: [facility] ENDOSCOPY;  Service: Endoscopy;  Laterality: N/A;   IR BONE MARROW BIOPSY & ASPIRATION  01/24/2023   IR CT BONE TROCAR/NEEDLE BIOPSY DEEP  01/24/2023     reports that he has been smoking cigarettes. He has a 12 pack-year smoking history. He has been exposed to tobacco smoke. He has never used smokeless tobacco. He reports current alcohol use of about 6.0 standard drinks of alcohol per week. He reports that he does not use drugs.  No Known Allergies  History reviewed. No pertinent family history.  Prior to Admission medications   Medication Sig Start Date End Date Taking? Authorizing Provider  amLODipine (NORVASC) 10 MG tablet Take 10 mg by mouth daily. 04/19/21   [provider]  bisacodyl (DULCOLAX) 10 MG suppository Place 1 suppository (10 mg total) rectally  daily as needed for up to 12 doses for moderate constipation. 12/15/22   Terald Sleeper, MD  ciprofloxacin (CIPRO) 500 MG tablet Take by mouth. Patient not taking: Reported on 03/28/2023 02/25/23    [provider]  feeding supplement (ENSURE ENLIVE / ENSURE PLUS) LIQD Take 237 mLs by mouth 3 (three) times daily between meals. 01/24/23   Arnetha Courser, MD  JARDIANCE 10 MG TABS tablet Take 10 mg by mouth daily. 01/09/23   [provider]  magnesium hydroxide (MILK OF MAGNESIA) 400 MG/5ML suspension Take 5 mLs by mouth daily as needed for mild constipation.    [provider]  megestrol (MEGACE) 40 MG tablet Take 2 tablets (80 mg total) by mouth 2 (two) times daily. 03/28/23   Rickard Patience, MD  metFORMIN (GLUCOPHAGE) 500 MG tablet Take 1,000 mg by mouth 2 (two) times daily with a meal. 09/18/22   [provider]  nystatin (MYCOSTATIN) 100000 UNIT/ML suspension Use as directed 5 mLs (500,000 Units total) in the mouth or throat 4 (four) times daily. 03/14/23   Rickard Patience, MD  ondansetron (ZOFRAN-ODT) 8 MG disintegrating tablet Take 1 tablet (8 mg total) by mouth every 8 (eight) hours as needed for nausea. 02/12/23   Rickard Patience, MD  oxyCODONE (ROXICODONE) 5 MG immediate release tablet Take 1 tablet (5 mg total) by mouth every 6 (six) hours as needed for breakthrough pain or severe pain (pain score 7-10). 03/24/23   Borders, Daryl Eastern, NP  pantoprazole (PROTONIX) 40 MG tablet Take 1 tablet (40 mg total) by mouth daily. 03/14/23   Rickard Patience, MD  polyethylene glycol (MIRALAX / GLYCOLAX) 17 g packet Take 17 g by mouth daily.    [provider]  potassium chloride SA (KLOR-CON M) 20 MEQ tablet Take 2 tablets (40 mEq total) by mouth daily. 03/21/23   Rickard Patience, MD  pravastatin (PRAVACHOL) 20 MG tablet Take 20 mg by mouth daily. 02/06/21   [provider]  prochlorperazine (COMPAZINE) 10 MG tablet TAKE 1 TABLET(10 MG) BY MOUTH EVERY 6 HOURS AS NEEDED FOR NAUSEA OR VOMITING 03/17/23   Rickard Patience, MD  senna-docusate (SENOKOT-S) 8.6-50 MG tablet Take 1 tablet by mouth daily as needed for up to 30 doses for mild constipation. 12/15/22   Terald Sleeper, MD  senna-docusate  (SENOKOT-S) 8.6-50 MG tablet Take 2 tablets by mouth daily. 03/28/23   Rickard Patience, MD  tamsulosin (FLOMAX) 0.4 MG CAPS capsule Take 1 capsule by mouth daily. 08/21/22   [provider]    Physical Exam: Vitals:   03/29/23 0900 03/29/23 0930 03/29/23 1000 03/29/23 1030  BP: 130/82 133/86 127/81 (!) 143/80  Pulse: 90 74 81 79  Resp: 17 18 20 15   Temp:      TempSrc:      SpO2: 100% 100% 100% 100%  Weight:      Height:        Constitutional: NAD, calm, comfortable Vitals:   03/29/23 0900 03/29/23 0930 03/29/23 1000 03/29/23 1030  BP: 130/82 133/86 127/81 (!) 143/80  Pulse: 90 74 81 79  Resp: 17 18 20 15   Temp:      TempSrc:      SpO2: 100% 100% 100% 100%  Weight:      Height:       Eyes: PERRL, lids and conjunctivae normal ENMT: Mucous membranes are dry. Posterior pharynx clear of any exudate or lesions.Normal dentition.  Neck: normal, supple, no masses, no thyromegaly Respiratory: clear to auscultation bilaterally, no  wheezing, no crackles. Normal respiratory effort. No accessory muscle use.  Cardiovascular: Regular rate and rhythm, no murmurs / rubs / gallops. No extremity edema. 2+ pedal pulses. No carotid bruits.  Abdomen: no tenderness, no masses palpated. No hepatosplenomegaly. Bowel sounds positive.  Musculoskeletal: no clubbing / cyanosis. No joint deformity upper and lower extremities. Good ROM, no contractures. Normal muscle tone.  Skin: no rashes, lesions, ulcers. No induration Neurologic: CN 2-12 grossly intact. Sensation intact, DTR normal. Strength 5/5 in all 4.  Psychiatric: Normal judgment and insight. Alert and oriented x 3. Normal mood.     Labs on Admission: I have personally reviewed following labs and imaging studies  CBC: Recent Labs  Lab 03/26/23 0935 03/28/23 0841 03/29/23 0718  WBC 13.3* 13.9* 15.1*  NEUTROABS 9.1* 11.0*  --   HGB 8.2* 9.4* 9.3*  HCT 26.7* 29.0* 28.8*  MCV 79.2* 76.3* 76.0*  PLT 467* 363 393   Basic Metabolic  Panel: Recent Labs  Lab 03/26/23 0935 03/28/23 0841 03/29/23 0718  NA 131* 131* 125*  K 4.5 2.9* 3.0*  CL 92* 90* 87*  CO2 23 24 24   GLUCOSE 140* 162* 260*  BUN 10 11 9   CREATININE 0.89 0.85 0.66  CALCIUM 9.6 9.3 9.5   GFR: Estimated Creatinine Clearance: 77.7 mL/min (by C-G formula based on SCr of 0.66 mg/dL). Liver Function Tests: Recent Labs  Lab 03/26/23 0935 03/28/23 0841 03/29/23 0718  AST 20 26 31   ALT 8 10 12   ALKPHOS 155* 178* 193*  BILITOT 0.5 1.3* 0.8  PROT 7.9 7.9 8.1  ALBUMIN 3.1* 3.1* 3.0*   Recent Labs  Lab 03/29/23 0718  LIPASE 22   No results for input(s): "AMMONIA" in the last 168 hours. Coagulation Profile: No results for input(s): "INR", "PROTIME" in the last 168 hours. Cardiac Enzymes: No results for input(s): "CKTOTAL", "CKMB", "CKMBINDEX", "TROPONINI" in the last 168 hours. BNP (last 3 results) No results for input(s): "PROBNP" in the last 8760 hours. HbA1C: No results for input(s): "HGBA1C" in the last 72 hours. CBG: No results for input(s): "GLUCAP" in the last 168 hours. Lipid Profile: No results for input(s): "CHOL", "HDL", "LDLCALC", "TRIG", "CHOLHDL", "LDLDIRECT" in the last 72 hours. Thyroid Function Tests: No results for input(s): "TSH", "T4TOTAL", "FREET4", "T3FREE", "THYROIDAB" in the last 72 hours. Anemia Panel: No results for input(s): "VITAMINB12", "FOLATE", "FERRITIN", "TIBC", "IRON", "RETICCTPCT" in the last 72 hours. Urine analysis:    Component Value Date/Time   COLORURINE AMBER (A) 03/17/2023 1356   APPEARANCEUR CLOUDY (A) 03/17/2023 1356   APPEARANCEUR Hazy (A) 11/13/2022 1608   LABSPEC 1.018 03/17/2023 1356   LABSPEC 1.041 02/23/2012 1734   PHURINE 6.0 03/17/2023 1356   GLUCOSEU NEGATIVE 03/17/2023 1356   GLUCOSEU 150 mg/dL 60/45/4098 1191   HGBUR LARGE (A) 03/17/2023 1356   BILIRUBINUR NEGATIVE 03/17/2023 1356   BILIRUBINUR Negative 11/13/2022 1608   BILIRUBINUR Negative 02/23/2012 1734   KETONESUR NEGATIVE  03/17/2023 1356   PROTEINUR 100 (A) 03/17/2023 1356   NITRITE POSITIVE (A) 03/17/2023 1356   LEUKOCYTESUR LARGE (A) 03/17/2023 1356   LEUKOCYTESUR Negative 02/23/2012 1734    Radiological Exams on Admission: CT Angio Chest PE W/Cm &/Or Wo Cm  Result Date: 03/29/2023 CLINICAL DATA:  Pulmonary embolism (PE) suspected, high prob. Left-sided chest pain. Acute onset while at rest at home. Radiation to back. EXAM: CT ANGIOGRAPHY CHEST WITH CONTRAST TECHNIQUE: Multidetector CT imaging of the chest was performed using the standard protocol during bolus administration of intravenous contrast. Multiplanar CT image  reconstructions and MIPs were obtained to evaluate the vascular anatomy. RADIATION DOSE REDUCTION: This exam was performed according to the departmental dose-optimization program which includes automated exposure control, adjustment of the mA and/or kV according to patient size and/or use of iterative reconstruction technique. CONTRAST:  75mL OMNIPAQUE IOHEXOL 350 MG/ML SOLN COMPARISON:  CT angiography chest from 01/07/2023. FINDINGS: Cardiovascular: No evidence of embolism to the proximal subsegmental pulmonary artery level. Normal cardiac size. No pericardial effusion. No aortic aneurysm. Mediastinum/Nodes: Visualized thyroid gland appears grossly unremarkable. No solid / cystic mediastinal masses. The esophagus is nondistended precluding optimal assessment. There are multiple marked enlarged mediastinal and hilar lymph nodes with largest in the subcarinal location measuring up to 2.0 x 4.5 cm and largest in the left paratracheal region measuring up to 0.3 x 2.4 cm. These are grossly similar to the prior study. No axillary or hilar lymphadenopathy by size criteria. Lungs/Pleura: The central tracheo-bronchial tree is patent. There is mild, smooth, circumferential thickening of the segmental and subsegmental bronchial walls, throughout bilateral lungs, which is nonspecific. Findings are most commonly seen  with bronchitis or reactive airway disease, such as asthma. There are superimposed several areas of even marked bronchial thickening (for example, series 7, images 52, 56, 68, etc.), which are nonspecific but appear grossly similar to the prior study. There are innumerable sub 5 mm, solid and ground-glass nodules in random distribution pattern. There is overall interval progression of the number of nodules when compared to the prior exam from 11/23/2022 and 01/07/2023. These are concerning for metastases. There is also new irregular thickening along the inferior portion of right major fissure, irregular/nodular interlobular septal thickening and thickening along the right diaphragmatic surface, also concerning for lymphatic spread of tumor. No consolidation, pleural effusion or pneumothorax. Upper Abdomen: There are innumerable hypoattenuating liver masses, occupying approximately 15-20% of the total liver parenchyma with largest lesion in the left hepatic lobe measuring up to 2.0 x 2.8 cm, favored to represent metastases. There is diffuse thickening of bilateral adrenal glands, without discrete nodule. Findings are nonspecific but mostly associated with adrenal hyperplasia. There are scattered diverticula in the splenic flexure of colon without diverticulitis. Contracted gallbladder containing small-to-moderate volume of calcified gallstones without imaging signs of acute cholecystitis. Remaining visualized upper abdominal viscera are grossly within normal limits. Musculoskeletal: The visualized soft tissues of the chest wall are grossly unremarkable. Redemonstration of extensive sclerotic lesions throughout the imaged bones, compatible with metastases. No pathological fracture. Review of the MIP images confirms the above findings. IMPRESSION: 1. No evidence of embolism to the proximal subsegmental pulmonary artery level. 2. Continued interval progression of innumerable, sub 5 mm, solid and ground-glass nodules in  random distribution throughout bilateral lungs with new irregular thickening along the right major fissure and along the right diaphragmatic surface, concerning for metastatic disease. 3. Markedly enlarged mediastinal and hilar lymph nodes and extensive osseous metastases, also compatible with metastases. 4. There are innumerable hypoattenuating liver masses, compatible with metastases. 5. Multiple other nonacute observations, as described above. Electronically Signed   By: Jules Schick M.D.   On: 03/29/2023 08:36   DG Chest 2 View  Result Date: 03/29/2023 CLINICAL DATA:  62 year old male with left side chest pain. Pain radiating to the back. Metastatic prostate cancer. EXAM: CHEST - 2 VIEW COMPARISON:  Chest radiographs 02/03/2023 and earlier. FINDINGS: Heterogeneous bone mineralization in keeping with prostate skeletal osseous metastatic disease, seems more radiographically apparent in the ribs now compared to July. Mediastinal lymphadenopathy demonstrated on previous CTA. Stable increased  mediastinal contour since September. Heart size remains normal. Visualized tracheal air column is within normal limits. Coarse bilateral pulmonary interstitial opacity is stable since July. No pneumothorax, pulmonary edema, pleural effusion or confluent lung opacity. Negative visible bowel gas. IMPRESSION: 1. Skeletal and mediastinal metastatic disease. Subtle radiographic progression of the bone disease since July. 2. Chronic pulmonary interstitial changes. No acute cardiopulmonary abnormality. Electronically Signed   By: Odessa Fleming M.D.   On: 03/29/2023 05:08    EKG: Independently reviewed.  Sinus rhythm, no acute ST changes.  Assessment/Plan Principal Problem:   Chest pain Active Problems:   Dysphagia   Hyponatremia   Hypokalemia  (please populate well all problems here in Problem List. (For example, if patient is on BP meds at home and you resume or decide to hold them, it is a problem that needs to be her.  Same for CAD, COPD, HLD and so on)  Atypical chest pain -Rule out ACS.  Follow-up trending troponins.  If troponins remain negative likely patient can discharge home and outpatient follow-up with cardiology and outpatient echocardiogram. -Other DDx, CTA negative for PE.  Likely chest pain related to metastatic lung lesions -Symptomatic management  Hypokalemia -Secondary to poor oral intake -P.o. replacement -Check magnesium and phosphorus level  Hyponatremia with hypochloremia -Hypovolemic, secondary to poor oral intake -Correct volume status first then reevaluate sodium level this afternoon.  Worsening of dysphagia -Likely secondary to metastatic mediastinal nodes and esophageal compression.  Scheduled for GI consult sometime next week -Speech evaluation -Soft diet  IIDM -Continue Jardiance  -Hold off metformin as patient received IV contrast today -SSI  Prostate cancer with extensive metastasis -According to oncology evaluation recently, prognosis is poor and patient was referred to see outpatient comfort care -His code at this point  Chronic microcytic anemia Chronic leukocytosis -According to oncology, this changes likely secondary to bone marrow occupation by metastatic cancer  Severe protein calorie malnutrition Cachexia -Probably related to metastatic cancer as well as worsening of dysphagia -Continue Megace and protein supplement  DVT prophylaxis: Lovenox Code Status: Full code.  Overall prognosis poor, patient and family aware.  Discussed with patient regarding palliative care patient however expressed that he is not ready Family Communication: Wife at bedside Disposition Plan: Expect less than 2 midnight hospital stay Consults called: None Admission status: Telemetry observation   Emeline General MD [facility] Pager [phone]  03/29/2023, 10:57 AM

## 2023-03-29 NOTE — ED Notes (Addendum)
Updated, family at [facility].

## 2023-03-29 NOTE — ED Provider Notes (Signed)
Patient care assumed from Dr. Dolores Frame.  Patient continues to have chest pain.  Will dose pain medication.  Patient's workup shows chronic anemia, white blood cell count elevated to 15, troponin reassuringly negative lipase negative LFTs reassuring besides mild dehydration with an elevated anion gap of 14.  Sodium of 125.  Patient has a history of hyponatremia in the past but baseline seems to be closer to the low 130s.  Given the patient's hyponatremia to 125 we will admit to the hospital service for further workup and treatment.  Patient CTA of the chest is negative for PE numerous sub-5 mm nodules throughout the lungs concerning for metastatic disease.  Significant amount of mediastinal and hilar lymph node possible metastatic disease.  Many liver masses again possible metastatic disease.  Patient is on chemotherapy for active prostate cancer.   Minna Antis, MD 03/29/23 [phone]

## 2023-03-29 NOTE — Evaluation (Signed)
Clinical/Bedside Swallow Evaluation Patient Details  Name: Louis Gardner MRN: [id number] Date of Birth: 03-20-1961  Today's Date: 03/29/2023 Time: SLP Start Time (ACUTE ONLY): 1230 SLP Stop Time (ACUTE ONLY): 1330 SLP Time Calculation (min) (ACUTE ONLY): 60 min  Past Medical History:  Past Medical History:  Diagnosis Date   Arthritis    Chronic bilateral low back pain without sciatica 12/02/2017   Depression    Enlarged prostate with urinary retention 08/21/2022   Essential hypertension 03/27/2015   Gallstones and inflammation of gallbladder without obstruction 01/28/2022   GI bleeding    History of seizures 01/06/2018   Hypertension    Leg weakness, bilateral 12/02/2017   Low back pain with right-sided sciatica 12/02/2017   Non-cardiac chest pain 04/17/2015   Pancreatic cancer [facility])    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   Seizure-like activity (HCC) 12/02/2017   Sleep apnea    Past Surgical History:  Past Surgical History:  Procedure Laterality Date   COLONOSCOPY     COLONOSCOPY WITH PROPOFOL N/A 12/09/2022   Procedure: COLONOSCOPY WITH PROPOFOL;  Surgeon: Wyline Mood, MD;  Location: [facility] ENDOSCOPY;  Service: Gastroenterology;  Laterality: N/A;   ESOPHAGOGASTRODUODENOSCOPY (EGD) WITH PROPOFOL N/A 03/19/2016   Procedure: ESOPHAGOGASTRODUODENOSCOPY (EGD) WITH PROPOFOL;  Surgeon: Christena Deem, MD;  Location: [facility] ENDOSCOPY;  Service: Endoscopy;  Laterality: N/A;   IR BONE MARROW BIOPSY & ASPIRATION  01/24/2023   IR CT BONE TROCAR/NEEDLE BIOPSY DEEP  01/24/2023   HPI:  Pt is a 62 y.o. 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   Increased dysphagia in recent weeks, has been having trouble swallowing solid foods, but not purees, liquid or water.  Symptoms have gradually worsened, and patient "eating less and resulted in gradual weight loss" -- noted Dietician notes at the [facility].  Denied any abdominal pain, no nausea.  No diarrhea.  Last night patient started develop left-sided rib cage pain, dull ache, constant, no exacerbating or relieving factors.  He felt generalized weakness and came to the hospital.     CT chest Imaging: Continued interval progression of innumerable, sub 5 mm, solid  and ground-glass nodules in random distribution throughout bilateral  lungs with new irregular thickening along the right major fissure  and along the right diaphragmatic surface, concerning for metastatic  disease.  3. Markedly enlarged mediastinal and hilar lymph nodes and extensive  osseous metastases, also compatible with metastases.  4. There are innumerable hypoattenuating liver masses, compatible  with metastases.  5. Multiple other nonacute observations     Assessment / Plan / Recommendation  Clinical Impression   Pt seen for BSE today. Pt awake, verbal and stated "I don't have to eat all of that, do I?". Family in room. Pt followed all instructions; indicated wants/needs clearly. Noted pt has Baseline lack of appetite and is followed by the [facility]; suspected progression of metastatic disease per chart notes. Dietician f/u at the [facility]. On RA, afebrile.   Pt appears to present w/ functional oropharyngeal phase swallowing w/ No overt oropharyngeal phase dysphagia noted, No neuromuscular deficits noted. Pt consumed po trials w/ No overt, clinical s/s of aspiration during po trials.  Pt appears at reduced risk for aspiration following  general aspiration precautions. However, pt does have challenging factors to that could impact his Esophageal phase of swallowing to include progression  of metastatic disease: "metastatic mediastinum lymphadenopathy" which may be impacting Esophageal phase motility w/ foods especially; and lack of desire for oral intake(foods) overall.  These factors can increase risk for REFLUX aspiration, dysphagia as well as decreased oral intake overall.   During po trials, pt consumed all consistencies w/ no overt coughing, decline in vocal quality, or change in respiratory presentation during/post trials. No decline in O2 sats. Oral phase appeared [facility] w/ timely bolus management, mastication, and control of bolus propulsion for A-P transfer for swallowing. Oral clearing achieved w/ all trial consistencies -- moistened, soft foods given.  OM Exam appeared [facility] w/ no unilateral weakness noted. Speech Clear. Pt fed self w/ setup support.   Recommend a more Mech Soft consistency diet w/ well-Cut meats, moistened foods; Thin liquids. Recommend general aspiration precautions, REFLUX precautions. Foods of pleasure and choice by pt. Gravies to moisten foods well; Soups. Pills WHOLE vs Crushed in Puree for easier swallowing as needed.  Education given on Pills in Puree; food consistencies and easy to eat options; general aspiration precautions to pt and Family.  Family stated pt is supposed to be seen by GI Outpatient on Monday, next week. They wish for this to be done while pt is in the hospital -- MD informed. 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  Recommend continued Dietician f/u for supplement support.   Recommend Palliative Care f/u for discussion of overall medical status/progression of disease and GOC. No further Acute ST services indicated at this time. MD to reconsult if any new needs arise. NSG updated, agreed. MD updated.  SLP Visit Diagnosis: Dysphagia, unspecified (R13.10) (suspect Esophageal phase  Dysmotility. Globus sensation at home; decreased oral intake overall.)    Aspiration Risk   (reduced following general precautions)    Diet Recommendation   Thin;Dysphagia 3 (mechanical soft) (chopped and moistened well) = a more Mech Soft consistency diet w/ well-Cut meats, moistened foods; Thin liquids. Recommend general aspiration precautions, REFLUX precautions. Foods of pleasure and choice by pt. Gravies to moisten foods well; Soups.  Medication Administration: Whole meds with puree (IF needed for ease of swallowing)    Other  Recommendations Recommended Consults: Consider GI evaluation;Consider esophageal assessment (per pt/Family; Palliative Care f/u for GOC and progression of disease process discussion) Oral Care Recommendations: Oral care BID;Patient independent with oral care    Recommendations for follow up therapy are one component of a multi-disciplinary discharge planning process, led by the attending physician.  Recommendations may be updated based on patient status, additional functional criteria and insurance authorization.  Follow up Recommendations No SLP follow up      Assistance Recommended at Discharge  PRN  Functional Status Assessment Patient has had a recent decline in their functional status and/or demonstrates limited ability to make significant improvements in function in a reasonable and predictable amount of time (Metastatic Cancer. 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 Globus sensation at home; decreased oral intake overall.)  Frequency and Duration  (n/a)   (n/a)       Prognosis Prognosis for improved oropharyngeal function: Fair Barriers to Reach Goals: Time post onset;Severity of deficits;Motivation Barriers/Prognosis Comment: Metastatic Cancer. 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 Globus sensation at home; decreased oral intake overall.      Swallow Study  General Date of Onset: 03/28/23 HPI: Pt is a 62 y.o. 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   Increased dysphagia in recent weeks, has been having trouble swallowing solid foods, but not purees, liquid or water.  Symptoms have gradually worsened, and patient "eating less and resulted in gradual weight loss" -- noted Dietician notes at the [facility].  Denied any abdominal pain, no nausea.  No diarrhea.  Last night patient started develop left-sided rib cage pain, dull ache, constant, no exacerbating or relieving factors.  He felt generalized weakness and came to the hospital.   CT chest Imaging: Continued interval progression of innumerable, sub 5 mm, solid  and ground-glass nodules in random distribution throughout bilateral  lungs with new irregular thickening along the right major fissure  and along the right diaphragmatic surface, concerning for metastatic  disease.  3. Markedly enlarged mediastinal and hilar lymph nodes and extensive  osseous metastases, also compatible with metastases.  4. There are innumerable hypoattenuating liver masses, compatible  with metastases.  5. Multiple other nonacute observations Type of Study: Bedside Swallow Evaluation Previous Swallow Assessment: none Diet Prior to this Study: Regular;Thin liquids (Level 0) (at home) Temperature Spikes Noted: No (wbc elevated) Respiratory Status: Room air History of Recent Intubation: No Behavior/Cognition: Alert;Cooperative;Pleasant mood Oral Cavity Assessment: Within Functional Limits Oral Care Completed by SLP: Yes Oral Cavity - Dentition: Adequate natural dentition;Missing dentition (few) Vision: Functional for self-feeding Self-Feeding Abilities: Able to feed  self;Needs set up Patient Positioning: Upright in bed (supported) Baseline Vocal Quality: Normal Volitional Cough: Strong Volitional Swallow: Able to elicit    Oral/Motor/Sensory Function Overall Oral Motor/Sensory Function: Within functional limits   Ice Chips Ice chips: Not tested   Thin Liquid Thin Liquid: Within functional limits Presentation: Self Fed;Straw (~9 trials accepted)    Nectar Thick Nectar Thick Liquid: Not tested   Honey Thick Honey Thick Liquid: Not tested   Puree Puree: Within functional limits Presentation: Spoon;Self Fed (5 trials)   Solid     Solid: Within functional limits Presentation: Self Fed;Spoon (4 trials - moistened) Other Comments: encouraged        Jerilynn Som, MS, CCC-SLP Speech Language Pathologist Rehab Services; [facility] [phone] (ascom) Glenora Morocho 03/29/2023,2:31 PM

## 2023-03-29 NOTE — ED Provider Notes (Signed)
[facility] Provider Note    Event Date/Time   First MD Initiated Contact with Patient 03/29/23 [phone]     (approximate)   History   Chest Pain   HPI  Louis Gardner is a 62 y.o. male who presents to the ED from home with a chief complaint of chest and back pain.  Patient with a history of metastatic prostate cancer on chemotherapy who reports left-sided chest pressure radiating to his back around 8:30 PM, worsened with deep inspiration.  Associated nausea.  Denies diaphoresis, palpitations, shortness of breath, nausea/vomiting or dizziness.  Received IV fluids at oncology office yesterday for hyponatremia and blood transfusion 03/27/2023.     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   Seizure-like activity (HCC) 12/02/2017   Sleep apnea      Active Problem List   Patient Active Problem List   Diagnosis Date Noted   Constipation 03/28/2023   Cancer cachexia (HCC) 03/20/2023   Symptomatic anemia 03/20/2023   Hypokalemia 02/21/2023   Goals of care, counseling/discussion 02/12/2023   Protein-calorie malnutrition, severe (HCC) 02/12/2023   Nausea without vomiting 02/12/2023   Neoplasm related pain 02/12/2023   Weakness 01/24/2023   Leucocytosis 01/22/2023   Thrombocytosis 01/22/2023   Malnutrition of moderate degree 01/21/2023   Hyponatremia 01/20/2023   SIRS (systemic inflammatory  response syndrome) (HCC) 01/20/2023   Dysphagia 01/18/2023   Normocytic anemia 01/13/2023   Prostate cancer metastatic to bone (HCC) 11/27/2022   Screening for colon cancer 11/27/2022   Bladder outlet obstruction 11/27/2022     Past Surgical History   Past Surgical History:  Procedure Laterality Date   COLONOSCOPY     COLONOSCOPY WITH PROPOFOL N/A 12/09/2022   Procedure: COLONOSCOPY WITH PROPOFOL;  Surgeon: Wyline Mood, MD;  Location: [facility] ENDOSCOPY;  Service: Gastroenterology;  Laterality: N/A;   ESOPHAGOGASTRODUODENOSCOPY (EGD) WITH PROPOFOL N/A 03/19/2016   Procedure: ESOPHAGOGASTRODUODENOSCOPY (EGD) WITH PROPOFOL;  Surgeon: Christena Deem, MD;  Location: [facility] ENDOSCOPY;  Service: Endoscopy;  Laterality: N/A;   IR BONE MARROW BIOPSY & ASPIRATION  01/24/2023   IR CT BONE TROCAR/NEEDLE BIOPSY DEEP  01/24/2023     Home Medications   Prior to Admission medications   Medication Sig Start Date End Date Taking? Authorizing Provider  amLODipine (NORVASC) 10 MG tablet Take 10 mg by mouth daily. 04/19/21   [provider]  bisacodyl (DULCOLAX) 10 MG suppository Place 1 suppository (10 mg total) rectally daily as needed for up to 12 doses for moderate constipation. 12/15/22   Terald Sleeper, MD  ciprofloxacin (CIPRO) 500 MG tablet Take by mouth. Patient not taking: Reported on 03/28/2023 02/25/23   [provider]  feeding supplement (ENSURE ENLIVE / ENSURE PLUS) LIQD Take 237 mLs by mouth 3 (three) times daily between meals. 01/24/23   Arnetha Courser, MD  JARDIANCE 10 MG TABS tablet Take 10 mg by mouth daily. 01/09/23   [provider]  magnesium hydroxide (MILK OF MAGNESIA) 400 MG/5ML suspension Take 5 mLs by mouth daily as needed for mild constipation.    [provider]  megestrol (MEGACE) 40 MG tablet Take 2 tablets (80 mg total) by mouth 2 (two) times daily. 03/28/23   Rickard Patience, MD  metFORMIN (GLUCOPHAGE) 500 MG tablet Take 1,000 mg by mouth 2 (two)  times daily with a meal. 09/18/22   [provider]  nystatin (MYCOSTATIN) 100000 UNIT/ML suspension Use as directed 5 mLs (500,000 Units total) in the mouth or throat 4 (four) times daily. 03/14/23   Rickard Patience, MD  ondansetron (ZOFRAN-ODT) 8 MG disintegrating tablet Take 1 tablet (8 mg total) by mouth every 8 (eight) hours as needed for nausea. 02/12/23   Rickard Patience, MD  oxyCODONE (ROXICODONE) 5 MG immediate release tablet Take 1 tablet (5 mg total) by mouth every 6 (six) hours as needed for breakthrough pain or severe pain (pain score 7-10). 03/24/23   Borders, Daryl Eastern, NP  pantoprazole (PROTONIX) 40 MG tablet Take 1 tablet (40 mg total) by mouth daily. 03/14/23   Rickard Patience, MD  polyethylene glycol (MIRALAX / GLYCOLAX) 17 g packet Take 17 g by mouth daily.    [provider]  potassium chloride SA (KLOR-CON M) 20 MEQ tablet Take 2 tablets (40 mEq total) by mouth daily. 03/21/23   Rickard Patience, MD  pravastatin (PRAVACHOL) 20 MG tablet Take 20 mg by mouth daily. 02/06/21   [provider]  prochlorperazine (COMPAZINE) 10 MG tablet TAKE 1 TABLET(10 MG) BY MOUTH EVERY 6 HOURS AS NEEDED FOR NAUSEA OR VOMITING 03/17/23   Rickard Patience, MD  senna-docusate (SENOKOT-S) 8.6-50 MG tablet Take 1 tablet by mouth daily as needed for up to 30 doses for mild constipation. 12/15/22   Terald Sleeper, MD  senna-docusate (SENOKOT-S) 8.6-50 MG tablet Take 2 tablets by mouth daily. 03/28/23   Rickard Patience, MD  tamsulosin (FLOMAX) 0.4 MG CAPS capsule Take 1 capsule by mouth daily. 08/21/22   [provider]     Allergies  Patient has no known allergies.   Family History  History reviewed. No pertinent family history.   Physical Exam  Triage Vital Signs: ED Triage Vitals  Encounter Vitals Group     BP 03/29/23 0425 136/82     Systolic BP Percentile --      Diastolic BP Percentile --      Pulse Rate 03/29/23 0425 93     Resp 03/29/23 0425 18     Temp 03/29/23 0425 97.8 F (36.6 C)     Temp  Source 03/29/23 0425 Oral     SpO2 03/29/23 0425 100 %     Weight 03/29/23 0414 128 lb (58.1 kg)     Height 03/29/23 0414 5\' 9"  (1.753 m)     Head Circumference --      Peak Flow --      Pain Score 03/29/23 0414 7     Pain Loc --      Pain Education --      Exclude from Growth Chart --     Updated Vital Signs: BP 136/82 (BP Location: Left Arm)   Pulse 93   Temp 97.8 F (36.6 C) (Oral)   Resp 18   Ht 5\' 9"  (1.753 m)   Wt 58.1 kg   SpO2 100%   BMI 18.90 kg/m    General: Awake, no distress.  Cachetic. CV:  RRR.  Good peripheral perfusion.  Resp:  Normal effort.  CTAB. Abd:  Nontender.  No distention.  Other:  Bilateral calves are supple without tenderness.   ED Results / Procedures / Treatments  Labs (all labs ordered are listed, but only abnormal results are displayed) Labs Reviewed  CBC  COMPREHENSIVE METABOLIC PANEL  LIPASE, BLOOD  TROPONIN I (HIGH SENSITIVITY)  TROPONIN I (HIGH SENSITIVITY)     EKG  ED ECG REPORT I, Desirai Traxler J, the attending physician, personally viewed and interpreted this ECG.   Date: 03/29/2023  EKG Time: 0414  Rate: 100  Rhythm: normal sinus rhythm  Axis: Normal  Intervals: PVCs  ST&T Change: Nonspecific    RADIOLOGY I have independently visualized and interpreted patient's imaging study as well as noted the radiology interpretation:  Chest x-ray: Skeletal and mediastinal metastatic disease  Official radiology report(s): DG Chest 2 View  Result Date: 03/29/2023 CLINICAL DATA:  62 year old male with left side chest pain. Pain radiating to the back. Metastatic prostate cancer. EXAM: CHEST - 2 VIEW COMPARISON:  Chest radiographs 02/03/2023 and earlier. FINDINGS: Heterogeneous bone mineralization in keeping with prostate skeletal osseous metastatic disease, seems more radiographically apparent in the ribs now compared to July. Mediastinal lymphadenopathy demonstrated on previous CTA. Stable increased mediastinal contour since  September. Heart size remains normal. Visualized tracheal air column is within normal limits. Coarse bilateral pulmonary interstitial opacity is stable since July. No pneumothorax, pulmonary edema, pleural effusion or confluent lung opacity. Negative visible bowel gas. IMPRESSION: 1. Skeletal and mediastinal metastatic disease. Subtle radiographic progression of the bone disease since July. 2. Chronic pulmonary interstitial changes. No acute cardiopulmonary abnormality. Electronically Signed   By: Odessa Fleming M.D.   On: 03/29/2023 05:08     PROCEDURES:  Critical Care performed: No  .1-3 Lead EKG Interpretation  Performed by: Irean Hong, MD Authorized by: Irean Hong, MD     Interpretation: normal     ECG rate:  90   ECG rate assessment: normal     Rhythm: sinus rhythm     Ectopy: none     Conduction: normal   Comments:     Patient placed on cardiac monitor to evaluate for arrhythmias    MEDICATIONS ORDERED IN ED: Medications - No data to display   IMPRESSION / MDM / ASSESSMENT AND PLAN / ED COURSE  I reviewed the triage vital signs and the nursing notes.                             62 year old male presenting with chest pain. Differential diagnosis includes, but is not limited to, ACS, aortic dissection, pulmonary embolism, cardiac tamponade, pneumothorax, pneumonia, pericarditis, myocarditis, GI-related causes including esophagitis/gastritis, and musculoskeletal chest wall pain.   I have personally reviewed patient's records and note his oncology office visit from yesterday.  Patient's presentation is most consistent with acute presentation with potential threat to life or bodily function.  The patient is on the cardiac monitor to evaluate for evidence of arrhythmia and/or significant heart rate changes.  Will obtain cardiac and respiratory panel, chest x-ray.  Anticipate CTA chest to evaluate for PE.  Patient currently voices no complaints of chest pain or shortness of breath at  this time.  Clinical Course as of 03/29/23 0715  Sat Mar 29, 2023  [id number] Care transferred to Dr. Lenard Lance at change of shift pending laboratory, CT results and disposition. [JS]    Clinical Course User Index [JS] Irean Hong, MD     FINAL CLINICAL  IMPRESSION(S) / ED DIAGNOSES   Final diagnoses:  Chest pain, unspecified type  Acute thoracic back pain, unspecified back pain laterality     Rx / DC Orders   ED Discharge Orders     None        Note:  This document was prepared using Dragon voice recognition software and may include unintentional dictation errors.   Irean Hong, MD 03/29/23 [phone]

## 2023-03-29 NOTE — ED Triage Notes (Signed)
L sided chest pain. Acute onset while at rest at home, just prior to arrival. Reports radiation to back. States worse with deep breathing. Reports associated nausea. Denies h/a, dizziness, vision change, paraesthesia, emesis.  Pt is active chemo patient for prostate cancer. Pt alert and oriented. Pt breathing unlabored currently and speaking in full sentences with symmetric chest rise and fall. Last chemo treatment was yesterday.

## 2023-03-29 NOTE — ED Notes (Signed)
1 unsuccessful attempt at blood draw.

## 2023-03-29 NOTE — ED Notes (Signed)
Emptied pt's leg bag at this time .

## 2023-03-30 DIAGNOSIS — R0789 Other chest pain: Secondary | ICD-10-CM | POA: Diagnosis present

## 2023-03-30 DIAGNOSIS — G893 Neoplasm related pain (acute) (chronic): Secondary | ICD-10-CM | POA: Diagnosis present

## 2023-03-30 DIAGNOSIS — C7802 Secondary malignant neoplasm of left lung: Secondary | ICD-10-CM | POA: Diagnosis present

## 2023-03-30 DIAGNOSIS — R509 Fever, unspecified: Secondary | ICD-10-CM | POA: Diagnosis present

## 2023-03-30 DIAGNOSIS — C61 Malignant neoplasm of prostate: Secondary | ICD-10-CM | POA: Diagnosis present

## 2023-03-30 DIAGNOSIS — E871 Hypo-osmolality and hyponatremia: Secondary | ICD-10-CM | POA: Diagnosis present

## 2023-03-30 DIAGNOSIS — C7801 Secondary malignant neoplasm of right lung: Secondary | ICD-10-CM | POA: Diagnosis present

## 2023-03-30 DIAGNOSIS — D630 Anemia in neoplastic disease: Secondary | ICD-10-CM | POA: Diagnosis present

## 2023-03-30 DIAGNOSIS — I10 Essential (primary) hypertension: Secondary | ICD-10-CM | POA: Diagnosis present

## 2023-03-30 DIAGNOSIS — E785 Hyperlipidemia, unspecified: Secondary | ICD-10-CM | POA: Diagnosis present

## 2023-03-30 DIAGNOSIS — C7951 Secondary malignant neoplasm of bone: Secondary | ICD-10-CM | POA: Diagnosis present

## 2023-03-30 DIAGNOSIS — E119 Type 2 diabetes mellitus without complications: Secondary | ICD-10-CM | POA: Diagnosis present

## 2023-03-30 DIAGNOSIS — F32A Depression, unspecified: Secondary | ICD-10-CM | POA: Diagnosis present

## 2023-03-30 DIAGNOSIS — R079 Chest pain, unspecified: Secondary | ICD-10-CM | POA: Diagnosis present

## 2023-03-30 DIAGNOSIS — C7952 Secondary malignant neoplasm of bone marrow: Secondary | ICD-10-CM | POA: Diagnosis present

## 2023-03-30 DIAGNOSIS — C787 Secondary malignant neoplasm of liver and intrahepatic bile duct: Secondary | ICD-10-CM | POA: Diagnosis present

## 2023-03-30 DIAGNOSIS — E861 Hypovolemia: Secondary | ICD-10-CM | POA: Diagnosis present

## 2023-03-30 DIAGNOSIS — Z681 Body mass index (BMI) 19 or less, adult: Secondary | ICD-10-CM | POA: Diagnosis not present

## 2023-03-30 DIAGNOSIS — M546 Pain in thoracic spine: Secondary | ICD-10-CM | POA: Diagnosis present

## 2023-03-30 DIAGNOSIS — R64 Cachexia: Secondary | ICD-10-CM | POA: Diagnosis present

## 2023-03-30 DIAGNOSIS — C775 Secondary and unspecified malignant neoplasm of intrapelvic lymph nodes: Secondary | ICD-10-CM | POA: Diagnosis present

## 2023-03-30 DIAGNOSIS — E876 Hypokalemia: Secondary | ICD-10-CM | POA: Diagnosis not present

## 2023-03-30 DIAGNOSIS — D72828 Other elevated white blood cell count: Secondary | ICD-10-CM | POA: Diagnosis present

## 2023-03-30 DIAGNOSIS — E43 Unspecified severe protein-calorie malnutrition: Secondary | ICD-10-CM | POA: Diagnosis present

## 2023-03-30 DIAGNOSIS — E878 Other disorders of electrolyte and fluid balance, not elsewhere classified: Secondary | ICD-10-CM | POA: Diagnosis present

## 2023-03-30 DIAGNOSIS — R1310 Dysphagia, unspecified: Secondary | ICD-10-CM | POA: Diagnosis present

## 2023-03-30 LAB — PHOSPHORUS: Phosphorus: 2.6 mg/dL (ref 2.5–4.6)

## 2023-03-30 LAB — BASIC METABOLIC PANEL
Anion gap: 12 (ref 5–15)
BUN: 6 mg/dL — ABNORMAL LOW (ref 8–23)
CO2: 22 mmol/L (ref 22–32)
Calcium: 8.3 mg/dL — ABNORMAL LOW (ref 8.9–10.3)
Chloride: 92 mmol/L — ABNORMAL LOW (ref 98–111)
Creatinine, Ser: 0.52 mg/dL — ABNORMAL LOW (ref 0.61–1.24)
GFR, Estimated: >60 mL/min (ref >60)
Glucose, Bld: 188 mg/dL — ABNORMAL HIGH (ref 70–99)
Potassium: 3.7 mmol/L (ref 3.5–5.1)
Sodium: 126 mmol/L — ABNORMAL LOW (ref 135–145)

## 2023-03-30 LAB — GLUCOSE, CAPILLARY
Glucose-Capillary: 174 mg/dL — ABNORMAL HIGH (ref 70–99)
Glucose-Capillary: 134 mg/dL — ABNORMAL HIGH (ref 70–99)
Glucose-Capillary: 205 mg/dL — ABNORMAL HIGH (ref 70–99)
Glucose-Capillary: 171 mg/dL — ABNORMAL HIGH (ref 70–99)

## 2023-03-30 LAB — MAGNESIUM: Magnesium: 1.3 mg/dL — ABNORMAL LOW (ref 1.7–2.4)

## 2023-03-30 MED ORDER — MAGNESIUM SULFATE 2 GM/50ML IV SOLN
2.0000 g | Freq: Once | INTRAVENOUS | Status: AC
  Administered 2023-03-30: 2 g via INTRAVENOUS
  Filled 2023-03-30: qty 50

## 2023-03-30 MED ORDER — CHLORHEXIDINE GLUCONATE CLOTH 2 % EX PADS
6.0000 | MEDICATED_PAD | Freq: Every day | CUTANEOUS | Status: DC
  Administered 2023-03-30 – 2023-04-01 (×3): 6 via TOPICAL

## 2023-03-30 MED ORDER — ACETAMINOPHEN 325 MG PO TABS
975.0000 mg | ORAL_TABLET | Freq: Once | ORAL | Status: AC
  Administered 2023-03-30: 975 mg via ORAL
  Filled 2023-03-30: qty 3

## 2023-03-30 MED ORDER — POTASSIUM CHLORIDE IN NACL 20-0.9 MEQ/L-% IV SOLN
INTRAVENOUS | Status: AC
  Filled 2023-03-30 (×2): qty 1000

## 2023-03-30 NOTE — Progress Notes (Signed)
   03/30/23 1702  Assess: MEWS Score  Temp 99.6 F (37.6 C)  BP (!) 101/57  MAP (mmHg) 71  Pulse Rate (!) 112  Resp 16  Level of Consciousness Alert  SpO2 100 %  O2 Device Room Air  Assess: MEWS Score  MEWS Temp 0  MEWS Systolic 0  MEWS Pulse 2  MEWS RR 0  MEWS LOC 0  MEWS Score 2  MEWS Score Color Yellow  Assess: if the MEWS score is Yellow or Red  Were vital signs accurate and taken at a resting state? No, vital signs rechecked  Does the patient meet 2 or more of the SIRS criteria? No  Does the patient have a confirmed or suspected source of infection? No  MEWS guidelines implemented   (patient was eating)  Assess: SIRS CRITERIA  SIRS Temperature  0  SIRS Pulse 1  SIRS Respirations  0  SIRS WBC 0  SIRS Score Sum  1

## 2023-03-30 NOTE — TOC Initial Note (Signed)
Transition of Care [facility]) - Initial/Assessment Note    Patient Details  Name: Louis Gardner MRN: [id number] Date of Birth: 1960-08-29  Transition of Care [facility]) CM/SW Contact:    Rodney Langton, RN Phone Number: 03/30/2023, 2:54 PM  Clinical Narrative:                  [facility] consult received for hospice referral.  Spoke with patient and mother, they do not have a preference in agency. Prefer to have someone come to the bedside tomorrow after 10am.  Diannia Ruder with [facility] notified.    Expected Discharge Plan: Home w Hospice Care Barriers to Discharge: Continued Medical Work up   Patient Goals and CMS Choice            Expected Discharge Plan and Services                                              Prior Living Arrangements/Services                       Activities of Daily Living   ADL Screening (condition at time of admission) Independently performs ADLs?: Yes (appropriate for developmental age) Is the patient deaf or have difficulty hearing?: No Does the patient have difficulty seeing, even when wearing glasses/contacts?: No Does the patient have difficulty concentrating, remembering, or making decisions?: No  Permission Sought/Granted                  Emotional Assessment              Admission diagnosis:  Chest pain [R07.9] Chest pain, unspecified type [R07.9] Acute thoracic back pain, unspecified back pain laterality [M54.6] Patient Active Problem List   Diagnosis Date Noted   Constipation 03/28/2023   Cancer cachexia (HCC) 03/20/2023   Symptomatic anemia 03/20/2023   Hypokalemia 02/21/2023   Goals of care, counseling/discussion 02/12/2023   Protein-calorie malnutrition, severe (HCC) 02/12/2023   Nausea without vomiting 02/12/2023   Neoplasm related pain 02/12/2023   Weakness 01/24/2023   Leucocytosis 01/22/2023   Thrombocytosis 01/22/2023   Malnutrition of moderate degree 01/21/2023   Hyponatremia 01/20/2023   SIRS  (systemic inflammatory response syndrome) (HCC) 01/20/2023   Dysphagia 01/18/2023   Normocytic anemia 01/13/2023   Prostate cancer metastatic to bone (HCC) 11/27/2022   Screening for colon cancer 11/27/2022   Bladder outlet obstruction 11/27/2022   Chest pain 04/17/2015   PCP:  [employer] Pharmacy:   [id number] [place], [place] - [address] AT [facility] [address] [place] [address] Phone: [phone] Fax: [phone]  [facility] #[id number] [place], [place] - [address] AT [facility] [address] [place] [address] Phone: [phone] Fax: [phone]     Social Determinants of Health (SDOH) Social History: SDOH Screenings   Food Insecurity: No Food Insecurity (03/30/2023)  Housing: Low Risk  (03/30/2023)  Transportation Needs: No Transportation Needs (03/30/2023)  Utilities: Not At Risk (03/30/2023)  Depression (PHQ2-9): Low Risk  (11/27/2022)  Financial Resource Strain: Low Risk  (11/27/2022)  Stress: No Stress Concern Present (11/27/2022)  Tobacco Use: High Risk (03/29/2023)  Health Literacy: Adequate Health Literacy (11/27/2022)   SDOH Interventions:     Readmission Risk Interventions    01/21/2023    2:28 PM  Readmission Risk Prevention Plan  Transportation Screening Complete  PCP or Specialist Appt within 3-5 Days Complete  HRI or Home Care Consult Complete  Palliative Care Screening Complete

## 2023-03-30 NOTE — Progress Notes (Signed)
The pt

## 2023-03-30 NOTE — ED Notes (Signed)
Informed RN Reaonna via chat/ pt has bed assigned (129A)

## 2023-03-30 NOTE — Progress Notes (Signed)
   03/30/23 1139  Assess: MEWS Score  Temp 98.5 F (36.9 C)  BP 116/73  MAP (mmHg) 86  Pulse Rate (!) 106  Resp 16  Level of Consciousness Alert  SpO2 100 %  Assess: MEWS Score  MEWS Temp 0  MEWS Systolic 0  MEWS Pulse 1  MEWS RR 0  MEWS LOC 0  MEWS Score 1  MEWS Score Color Green  Assess: if the MEWS score is Yellow or Red  Were vital signs accurate and taken at a resting state? No, vital signs rechecked  Does the patient meet 2 or more of the SIRS criteria? No  Does the patient have a confirmed or suspected source of infection? No  Assess: SIRS CRITERIA  SIRS Temperature  0  SIRS Pulse 1  SIRS Respirations  0  SIRS WBC 0  SIRS Score Sum  1

## 2023-03-30 NOTE — Plan of Care (Signed)
  Problem: Education: Goal: Ability to describe self-care measures that may prevent or decrease complications (Diabetes Survival Skills Education) will improve Outcome: Progressing Goal: Individualized Educational Video(s) Outcome: Progressing   Problem: Coping: Goal: Ability to adjust to condition or change in health will improve Outcome: Progressing   Problem: Fluid Volume: Goal: Ability to maintain a balanced intake and output will improve Outcome: Progressing   Problem: Health Behavior/Discharge Planning: Goal: Ability to identify and utilize available resources and services will improve Outcome: Progressing Goal: Ability to manage health-related needs will improve Outcome: Progressing   Problem: Metabolic: Goal: Ability to maintain appropriate glucose levels will improve Outcome: Progressing   Problem: Nutritional: Goal: Maintenance of adequate nutrition will improve Outcome: Progressing Goal: Progress toward achieving an optimal weight will improve Outcome: Progressing   Problem: Tissue Perfusion: Goal: Adequacy of tissue perfusion will improve Outcome: Progressing   Problem: Skin Integrity: Goal: Risk for impaired skin integrity will decrease Outcome: Progressing   Problem: Education: Goal: Knowledge of General Education information will improve Description: Including pain rating scale, medication(s)/side effects and non-pharmacologic comfort measures Outcome: Progressing   Problem: Health Behavior/Discharge Planning: Goal: Ability to manage health-related needs will improve Outcome: Progressing   Problem: Clinical Measurements: Goal: Ability to maintain clinical measurements within normal limits will improve Outcome: Progressing Goal: Will remain free from infection Outcome: Progressing Goal: Diagnostic test results will improve Outcome: Progressing Goal: Respiratory complications will improve Outcome: Progressing Goal: Cardiovascular complication will  be avoided Outcome: Progressing   Problem: Activity: Goal: Risk for activity intolerance will decrease Outcome: Progressing   Problem: Nutrition: Goal: Adequate nutrition will be maintained Outcome: Progressing   Problem: Elimination: Goal: Will not experience complications related to bowel motility Outcome: Progressing Goal: Will not experience complications related to urinary retention Outcome: Progressing   Problem: Pain Management: Goal: General experience of comfort will improve Outcome: Progressing   Problem: Safety: Goal: Ability to remain free from injury will improve Outcome: Progressing   Problem: Skin Integrity: Goal: Risk for impaired skin integrity will decrease Outcome: Progressing

## 2023-03-30 NOTE — Progress Notes (Addendum)
Progress Note    Louis Gardner  GEX:[id number] DOB: 12/07/1960  DOA: 03/29/2023 PCP: [employer]      Brief Narrative:    Medical records reviewed and are as summarized below:  JABRIL YELL is a 62 y.o. 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      Assessment/Plan:   Principal Problem:   Chest pain Active Problems:   Prostate cancer metastatic to bone (HCC)   Dysphagia   Hyponatremia   Protein-calorie malnutrition, severe (HCC)   Hypokalemia    Body mass index is 20.15 kg/m.   Chest pain: Improved.  Troponins negative.  Chest pain may be due to metastatic lung disease.   Acute on chronic hyponatremia and hypochloremia: Restart IV fluids for hydration because of poor oral intake.   Hypokalemia: Improved from 3-3.7.  Continue potassium repletion. Hypomagnesemia: Magnesium 1.3.  Replete with IV magnesium sulfate.  Type II DM: NovoLog as needed for hyperglycemia.  Jardiance and metformin have been held.   Prostate cancer with metastasis to the lungs, mediastinum and bones: Follow-up with oncologist.  Prognosis is poor.   Severe protein calorie malnutrition, dysphagia: Encourage adequate oral intake.  Continue nutritional supplements   Comorbidities include hypertension, hyperlipidemia, chronic anemia   Plan discussed with his mother at the bedside.  She said that they have been informed by his oncologist that he may not live up to Christmas.  She requested hospice consult. TOC consult has been placed for hospice consult.   Diet Order              DIET DYS 3 Room service appropriate? Yes with Assist; Fluid consistency: Thin; Fluid restriction: 2000 mL Fluid  Diet effective now                            Consultants: None  Procedures: None    Medications:    amLODipine  10 mg Oral Daily   Chlorhexidine Gluconate Cloth  6 each Topical Daily   enoxaparin (LOVENOX) injection  40 mg Subcutaneous Q24H   feeding supplement  237 mL Oral TID BM   insulin aspart  0-15 Units Subcutaneous TID WC   megestrol  80 mg Oral BID   pantoprazole  40 mg Oral Daily   polyethylene glycol  17 g Oral Daily   pravastatin  20 mg Oral Q2000   Continuous Infusions:  0.9 % NaCl with KCl 20 mEq / L 75 mL/hr at 03/30/23 1354   magnesium sulfate bolus IVPB       Anti-infectives (From admission, onward)    None              Family Communication/Anticipated D/C date and plan/Code Status   DVT prophylaxis: enoxaparin (LOVENOX) injection 40 mg Start: 03/29/23 2200     Code Status: Full Code  Family Communication: Plan discussed with his mother and girlfriend at the bedside Disposition Plan: Plan to discharge home   Status is: Observation The patient will require care spanning > 2 midnights and should be moved to inpatient because: Hyponatremia, poor oral intake       Subjective:   Interval  events noted.  He complains of back pain.  He said his swallowing is fine at this time.  He asked whether he could go home.  Jola Babinski, girlfriend, was at the bedside.  Objective:    Vitals:   03/30/23 0423 03/30/23 0845 03/30/23 1139 03/30/23 1145  BP: 127/79 125/72 116/73 112/72  Pulse: (!) 103 (!) 101 (!) 106 (!) 106  Resp: 16 18 16    Temp: 98.5 F (36.9 C) 98.9 F (37.2 C) 98.5 F (36.9 C)   TempSrc: Oral  Oral   SpO2: 100% 100% 100%   Weight: 61.9 kg     Height: 5\' 9"  (1.753 m)      No data found.   Intake/Output Summary (Last 24 hours) at 03/30/2023 1553 Last data filed at 03/30/2023  1500 Gross per 24 hour  Intake 0.04 ml  Output 500 ml  Net -499.96 ml   Filed Weights   03/29/23 0414 03/30/23 0423  Weight: 58.1 kg 61.9 kg    Exam:  GEN: NAD, ill-looking SKIN: Warm and dry EYES: No pallor or icterus ENT: MMM CV: RRR PULM: CTA B ABD: soft, ND, NT, +BS CNS: AAO x 3, non focal EXT: No edema or tenderness        Data Reviewed:   I have personally reviewed following labs and imaging studies:  Labs: Labs show the following:   Basic Metabolic Panel: Recent Labs  Lab 03/26/23 0935 03/28/23 0841 03/29/23 0717 03/29/23 0718 03/29/23 1608 03/30/23 0510 03/30/23 0930  NA 131* 131*  --  125* 126* 126*  --   K 4.5 2.9*  --  3.0* 3.3* 3.7  --   CL 92* 90*  --  87* 92* 92*  --   CO2 23 24  --  24 23 22   --   GLUCOSE 140* 162*  --  260* 152* 188*  --   BUN 10 11  --  9 6* 6*  --   CREATININE 0.89 0.85  --  0.66 0.46* 0.52*  --   CALCIUM 9.6 9.3  --  9.5 8.5* 8.3*  --   MG  --   --  1.7  --   --   --  1.3*  PHOS  --   --  2.1*  --   --   --  2.6   GFR Estimated Creatinine Clearance: 82.7 mL/min (A) (by C-G formula based on SCr of 0.52 mg/dL (L)). Liver Function Tests: Recent Labs  Lab 03/26/23 0935 03/28/23 0841 03/29/23 0718  AST 20 26 31   ALT 8 10 12   ALKPHOS 155* 178* 193*  BILITOT 0.5 1.3* 0.8  PROT 7.9 7.9 8.1  ALBUMIN 3.1* 3.1* 3.0*   Recent Labs  Lab 03/29/23 0718  LIPASE 22   No results for input(s): "AMMONIA" in the last 168 hours. Coagulation profile No results for input(s): "INR", "PROTIME" in the last 168 hours.  CBC: Recent Labs  Lab 03/26/23 0935 03/28/23 0841 03/29/23 0718  WBC 13.3* 13.9* 15.1*  NEUTROABS 9.1* 11.0*  --   HGB 8.2* 9.4* 9.3*  HCT 26.7* 29.0* 28.8*  MCV 79.2* 76.3* 76.0*  PLT 467* 363 393   Cardiac Enzymes: No results for input(s): "CKTOTAL", "CKMB", "CKMBINDEX", "TROPONINI" in the last 168 hours. BNP (last 3 results) No results for input(s): "PROBNP" in the last 8760 hours. CBG: Recent  Labs  Lab 03/29/23 1150 03/29/23 1612 03/29/23 2142 03/30/23 0846 03/30/23 1120  GLUCAP 187* 161* 132* 174* 134*   D-Dimer: No results  for input(s): "DDIMER" in the last 72 hours. Hgb A1c: Recent Labs    03/29/23 0717  HGBA1C 6.5*   Lipid Profile: No results for input(s): "CHOL", "HDL", "LDLCALC", "TRIG", "CHOLHDL", "LDLDIRECT" in the last 72 hours. Thyroid function studies: No results for input(s): "TSH", "T4TOTAL", "T3FREE", "THYROIDAB" in the last 72 hours.  Invalid input(s): "FREET3" Anemia work up: No results for input(s): "VITAMINB12", "FOLATE", "FERRITIN", "TIBC", "IRON", "RETICCTPCT" in the last 72 hours. Sepsis Labs: Recent Labs  Lab 03/26/23 0935 03/28/23 0841 03/29/23 0718  WBC 13.3* 13.9* 15.1*    Microbiology No results found for this or any previous visit (from the past 240 hour(s)).  Procedures and diagnostic studies:  CT Angio Chest PE W/Cm &/Or Wo Cm  Result Date: 03/29/2023 CLINICAL DATA:  Pulmonary embolism (PE) suspected, high prob. Left-sided chest pain. Acute onset while at rest at home. Radiation to back. EXAM: CT ANGIOGRAPHY CHEST WITH CONTRAST TECHNIQUE: Multidetector CT imaging of the chest was performed using the standard protocol during bolus administration of intravenous contrast. Multiplanar CT image reconstructions and MIPs were obtained to evaluate the vascular anatomy. RADIATION DOSE REDUCTION: This exam was performed according to the departmental dose-optimization program which includes automated exposure control, adjustment of the mA and/or kV according to patient size and/or use of iterative reconstruction technique. CONTRAST:  75mL OMNIPAQUE IOHEXOL 350 MG/ML SOLN COMPARISON:  CT angiography chest from 01/07/2023. FINDINGS: Cardiovascular: No evidence of embolism to the proximal subsegmental pulmonary artery level. Normal cardiac size. No pericardial effusion. No aortic aneurysm. Mediastinum/Nodes: Visualized thyroid gland appears grossly  unremarkable. No solid / cystic mediastinal masses. The esophagus is nondistended precluding optimal assessment. There are multiple marked enlarged mediastinal and hilar lymph nodes with largest in the subcarinal location measuring up to 2.0 x 4.5 cm and largest in the left paratracheal region measuring up to 0.3 x 2.4 cm. These are grossly similar to the prior study. No axillary or hilar lymphadenopathy by size criteria. Lungs/Pleura: The central tracheo-bronchial tree is patent. There is mild, smooth, circumferential thickening of the segmental and subsegmental bronchial walls, throughout bilateral lungs, which is nonspecific. Findings are most commonly seen with bronchitis or reactive airway disease, such as asthma. There are superimposed several areas of even marked bronchial thickening (for example, series 7, images 52, 56, 68, etc.), which are nonspecific but appear grossly similar to the prior study. There are innumerable sub 5 mm, solid and ground-glass nodules in random distribution pattern. There is overall interval progression of the number of nodules when compared to the prior exam from 11/23/2022 and 01/07/2023. These are concerning for metastases. There is also new irregular thickening along the inferior portion of right major fissure, irregular/nodular interlobular septal thickening and thickening along the right diaphragmatic surface, also concerning for lymphatic spread of tumor. No consolidation, pleural effusion or pneumothorax. Upper Abdomen: There are innumerable hypoattenuating liver masses, occupying approximately 15-20% of the total liver parenchyma with largest lesion in the left hepatic lobe measuring up to 2.0 x 2.8 cm, favored to represent metastases. There is diffuse thickening of bilateral adrenal glands, without discrete nodule. Findings are nonspecific but mostly associated with adrenal hyperplasia. There are scattered diverticula in the splenic flexure of colon without  diverticulitis. Contracted gallbladder containing small-to-moderate volume of calcified gallstones without imaging signs of acute cholecystitis. Remaining visualized upper abdominal viscera are grossly within normal limits. Musculoskeletal: The visualized soft tissues of the chest wall are grossly unremarkable. Redemonstration of extensive sclerotic lesions throughout the imaged bones, compatible with metastases. No pathological  fracture. Review of the MIP images confirms the above findings. IMPRESSION: 1. No evidence of embolism to the proximal subsegmental pulmonary artery level. 2. Continued interval progression of innumerable, sub 5 mm, solid and ground-glass nodules in random distribution throughout bilateral lungs with new irregular thickening along the right major fissure and along the right diaphragmatic surface, concerning for metastatic disease. 3. Markedly enlarged mediastinal and hilar lymph nodes and extensive osseous metastases, also compatible with metastases. 4. There are innumerable hypoattenuating liver masses, compatible with metastases. 5. Multiple other nonacute observations, as described above. Electronically Signed   By: Jules Schick M.D.   On: 03/29/2023 08:36   DG Chest 2 View  Result Date: 03/29/2023 CLINICAL DATA:  62 year old male with left side chest pain. Pain radiating to the back. Metastatic prostate cancer. EXAM: CHEST - 2 VIEW COMPARISON:  Chest radiographs 02/03/2023 and earlier. FINDINGS: Heterogeneous bone mineralization in keeping with prostate skeletal osseous metastatic disease, seems more radiographically apparent in the ribs now compared to July. Mediastinal lymphadenopathy demonstrated on previous CTA. Stable increased mediastinal contour since September. Heart size remains normal. Visualized tracheal air column is within normal limits. Coarse bilateral pulmonary interstitial opacity is stable since July. No pneumothorax, pulmonary edema, pleural effusion or  confluent lung opacity. Negative visible bowel gas. IMPRESSION: 1. Skeletal and mediastinal metastatic disease. Subtle radiographic progression of the bone disease since July. 2. Chronic pulmonary interstitial changes. No acute cardiopulmonary abnormality. Electronically Signed   By: Odessa Fleming M.D.   On: 03/29/2023 05:08               LOS: 0 days   Elizabelle Fite  [facility]   Pager on [email or web address]. If 7PM-7AM, please contact night-coverage at [email or web address]     03/30/2023, 3:53 PM

## 2023-03-31 ENCOUNTER — Inpatient Hospital Stay: Payer: Medicaid Other

## 2023-03-31 ENCOUNTER — Inpatient Hospital Stay: Payer: Medicaid Other | Admitting: Nurse Practitioner

## 2023-03-31 ENCOUNTER — Ambulatory Visit: Payer: Medicaid Other | Admitting: Physician Assistant

## 2023-03-31 ENCOUNTER — Telehealth: Payer: Self-pay | Admitting: Oncology

## 2023-03-31 DIAGNOSIS — E43 Unspecified severe protein-calorie malnutrition: Secondary | ICD-10-CM | POA: Diagnosis not present

## 2023-03-31 DIAGNOSIS — C7951 Secondary malignant neoplasm of bone: Secondary | ICD-10-CM

## 2023-03-31 DIAGNOSIS — C61 Malignant neoplasm of prostate: Secondary | ICD-10-CM | POA: Diagnosis not present

## 2023-03-31 DIAGNOSIS — E876 Hypokalemia: Secondary | ICD-10-CM | POA: Diagnosis not present

## 2023-03-31 DIAGNOSIS — E871 Hypo-osmolality and hyponatremia: Secondary | ICD-10-CM | POA: Diagnosis not present

## 2023-03-31 DIAGNOSIS — Z7189 Other specified counseling: Secondary | ICD-10-CM

## 2023-03-31 LAB — CBC WITH DIFFERENTIAL/PLATELET
Abs Immature Granulocytes: 0.09 10*3/uL — ABNORMAL HIGH (ref 0.00–0.07)
Basophils Absolute: 0 10*3/uL (ref 0.0–0.1)
Basophils Relative: 0 %
Eosinophils Absolute: 0 10*3/uL (ref 0.0–0.5)
Eosinophils Relative: 0 %
HCT: 22.9 % — ABNORMAL LOW (ref 39.0–52.0)
Hemoglobin: 7.6 g/dL — ABNORMAL LOW (ref 13.0–17.0)
Immature Granulocytes: 1 %
Lymphocytes Relative: 12 %
Lymphs Abs: 1.3 10*3/uL (ref 0.7–4.0)
MCH: 25 pg — ABNORMAL LOW (ref 26.0–34.0)
MCHC: 33.2 g/dL (ref 30.0–36.0)
MCV: 75.3 fL — ABNORMAL LOW (ref 80.0–100.0)
Monocytes Absolute: 0.9 10*3/uL (ref 0.1–1.0)
Monocytes Relative: 9 %
Neutro Abs: 8.5 10*3/uL — ABNORMAL HIGH (ref 1.7–7.7)
Neutrophils Relative %: 78 %
Platelets: 286 10*3/uL (ref 150–400)
RBC: 3.04 MIL/uL — ABNORMAL LOW (ref 4.22–5.81)
RDW: 18.3 % — ABNORMAL HIGH (ref 11.5–15.5)
WBC: 10.8 10*3/uL — ABNORMAL HIGH (ref 4.0–10.5)
nRBC: 0 % (ref 0.0–0.2)

## 2023-03-31 LAB — BASIC METABOLIC PANEL
Anion gap: 10 (ref 5–15)
BUN: 8 mg/dL (ref 8–23)
CO2: 23 mmol/L (ref 22–32)
Calcium: 8.1 mg/dL — ABNORMAL LOW (ref 8.9–10.3)
Chloride: 96 mmol/L — ABNORMAL LOW (ref 98–111)
Creatinine, Ser: 0.58 mg/dL — ABNORMAL LOW (ref 0.61–1.24)
GFR, Estimated: >60 mL/min (ref >60)
Glucose, Bld: 166 mg/dL — ABNORMAL HIGH (ref 70–99)
Potassium: 3.2 mmol/L — ABNORMAL LOW (ref 3.5–5.1)
Sodium: 129 mmol/L — ABNORMAL LOW (ref 135–145)

## 2023-03-31 LAB — MAGNESIUM: Magnesium: 2 mg/dL (ref 1.7–2.4)

## 2023-03-31 LAB — GLUCOSE, CAPILLARY
Glucose-Capillary: 206 mg/dL — ABNORMAL HIGH (ref 70–99)
Glucose-Capillary: 192 mg/dL — ABNORMAL HIGH (ref 70–99)
Glucose-Capillary: 167 mg/dL — ABNORMAL HIGH (ref 70–99)
Glucose-Capillary: 157 mg/dL — ABNORMAL HIGH (ref 70–99)

## 2023-03-31 LAB — PREPARE RBC (CROSSMATCH)

## 2023-03-31 MED ORDER — SODIUM CHLORIDE 0.9% IV SOLUTION: Freq: Once | INTRAVENOUS | Status: AC

## 2023-03-31 MED ORDER — POTASSIUM CHLORIDE CRYS ER 20 MEQ PO TBCR
40.0000 meq | EXTENDED_RELEASE_TABLET | Freq: Once | ORAL | Status: AC
  Administered 2023-03-31: 40 meq via ORAL
  Filled 2023-03-31: qty 2

## 2023-03-31 NOTE — Progress Notes (Signed)
Blood transfusion completed without any reaction. Will continue to monitor.

## 2023-03-31 NOTE — Progress Notes (Addendum)
[facility] LIAISON NOTE   Received request from [facility], Transitions of Care Manager, that family would like to speak to liaison regarding hospice options.  Met with patient, him mother, Emelda Brothers, his sister Steward Drone and girlfriend at the bedside to initiate education related to hospice philosophy, services, and team approach to care.  Family and patient are requesting [facility]), however, patient does not meet criteria for IPU, per Dr. Alphonsus Sias, Hospice MD.   Patient/family verbalized understanding of information given and would like to proceed with home with hospice services.   DME needs discussed.  Patient has the following equipment in the home: None  Patient/family requests the following equipment for delivery:   hospital bed, over bed table, walker, transport wheelchair               The address has been verified and is correct in the chart. Roselyn Bering, sister, is the family contact to arrange time of equipment delivery.   Please provide prescriptions at discharge as needed to ensure ongoing symptom management.   [facility] information and contact numbers given to Vicenta Dunning, patient's mother.  Above information shared with Charlynn Court, Transitions of Care Manager and hospital medical team.   Please call with any hospice related questions or concerns.  Thank you for the opportunity to participate in this patient's care.   Norris Cross, RN Nurse Liaison [phone]

## 2023-03-31 NOTE — Plan of Care (Signed)
  Problem: Coping: Goal: Ability to adjust to condition or change in health will improve Outcome: Progressing   Problem: Education: Goal: Knowledge of General Education information will improve Description: Including pain rating scale, medication(s)/side effects and non-pharmacologic comfort measures Outcome: Progressing   Problem: Clinical Measurements: Goal: Respiratory complications will improve Outcome: Progressing Goal: Cardiovascular complication will be avoided Outcome: Progressing   Problem: Elimination: Goal: Will not experience complications related to bowel motility Outcome: Progressing Goal: Will not experience complications related to urinary retention Outcome: Progressing   Problem: Pain Management: Goal: General experience of comfort will improve Outcome: Progressing   Problem: Health Behavior/Discharge Planning: Goal: Ability to manage health-related needs will improve Outcome: Not Progressing   Problem: Nutritional: Goal: Maintenance of adequate nutrition will improve Outcome: Not Progressing Goal: Progress toward achieving an optimal weight will improve Outcome: Not Progressing   Problem: Skin Integrity: Goal: Risk for impaired skin integrity will decrease Outcome: Not Progressing   Problem: Clinical Measurements: Goal: Ability to maintain clinical measurements within normal limits will improve Outcome: Not Progressing   Problem: Activity: Goal: Risk for activity intolerance will decrease Outcome: Not Progressing

## 2023-03-31 NOTE — Progress Notes (Addendum)
Progress Note    RAYNEN FARBMAN  NGE:[id number] DOB: 17-Nov-1960  DOA: 03/29/2023 PCP: [facility]      Brief Narrative:    Medical records reviewed and are as summarized below:  TRASEAN HOLTON is a 62 y.o. 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      Assessment/Plan:   Principal Problem:   Chest pain Active Problems:   Prostate cancer metastatic to bone (HCC)   Dysphagia   Hyponatremia   Protein-calorie malnutrition, severe (HCC)   Hypokalemia    Body mass index is 20.15 kg/m.   Chest pain: Improved.  Troponins negative.  Chest pain may be due to metastatic lung disease.   Acute on chronic hyponatremia and hypochloremia: Improving though levels are not back to normal.   Hypokalemia: Potassium dropped to 3.2 from 3.7.  Continue potassium repletion. Hypomagnesemia: Improved   Type II DM: NovoLog as needed for hyperglycemia.  Jardiance and metformin have been held.   Prostate cancer with metastasis to the lungs, liver, mediastinum and bones: Follow-up with oncologist.  Prognosis is poor.   Severe protein calorie malnutrition, dysphagia: Encourage adequate oral intake.  Continue nutritional supplements   Anemia of chronic disease, symptomatic: Hemoglobin dropped from 9.3-7.6.  Patient requested blood transfusion.  Case discussed with Dr. Cathie Hoops, oncologist, who said patient had been receiving blood transfusion as an outpatient because of Pluvicto treatment, and recommended a unit of packed red blood cells.   Comorbidities include hypertension,  hyperlipidemia, chronic anemia   Diagnoses and prognosis were discussed.  Patient stated he did not know prostate cancer has spread to the lungs and liver.  We discussed hospice and he said he was going to talk to hospice and decide what to do.  Patient and family met with hospice later in the day.  They wanted inpatient hospice but unfortunately he does not meet eligibility criteria for inpatient hospice at this time.  Plan to discharge home with hospice.  Follow-up with TOC and hospice team to assist with disposition.     Diet Order             DIET DYS 3 Room service appropriate? Yes with Assist; Fluid consistency: Thin; Fluid restriction: 2000 mL Fluid  Diet effective now                            Consultants: None  Procedures: None    Medications:    amLODipine  10 mg Oral Daily   Chlorhexidine Gluconate Cloth  6 each Topical Daily   enoxaparin (LOVENOX) injection  40 mg Subcutaneous Q24H   feeding supplement  237 mL Oral TID BM   insulin aspart  0-15 Units Subcutaneous TID WC   megestrol  80 mg Oral BID   pantoprazole  40 mg Oral Daily   polyethylene glycol  17 g Oral Daily   pravastatin  20 mg Oral Q2000   Continuous Infusions:     Anti-infectives (From admission, onward)    None              Family Communication/Anticipated D/C date and plan/Code Status   DVT prophylaxis:  enoxaparin (LOVENOX) injection 40 mg Start: 03/29/23 2200     Code Status: Full Code  Family Communication: None Disposition Plan: Plan to discharge home   Status is: Inpatient Remains inpatient appropriate because: Metastatic prostate cancer, hyponatremia         Subjective:   Interval events noted.  He said he feels better.  He complains of general weakness.  Objective:    Vitals:   03/31/23 0022 03/31/23 0510 03/31/23 0756 03/31/23 1215  BP: 112/68 109/62 105/66 115/74  Pulse: 96 (!) 105 (!) 105 (!) 104  Resp: 18 18 14 14   Temp: 98.2  F (36.8 C) 98.2 F (36.8 C)  98 F (36.7 C)  TempSrc: Oral Oral    SpO2: 100% 100% 100% 100%  Weight:      Height:       No data found.   Intake/Output Summary (Last 24 hours) at 03/31/2023 1552 Last data filed at 03/31/2023 1224 Gross per 24 hour  Intake 778.06 ml  Output 1275 ml  Net -496.94 ml   Filed Weights   03/29/23 0414 03/30/23 0423  Weight: 58.1 kg 61.9 kg    Exam:  GEN: NAD SKIN: Warm and dry EYES: No pallor or icterus ENT: MMM CV: RRR PULM: CTA B ABD: soft, ND, NT, +BS CNS: AAO x 3, non focal EXT: No edema or tenderness       Data Reviewed:   I have personally reviewed following labs and imaging studies:  Labs: Labs show the following:   Basic Metabolic Panel: Recent Labs  Lab 03/28/23 0841 03/29/23 0717 03/29/23 0718 03/29/23 1608 03/30/23 0510 03/30/23 0930 03/31/23 0724  NA 131*  --  125* 126* 126*  --  129*  K 2.9*  --  3.0* 3.3* 3.7  --  3.2*  CL 90*  --  87* 92* 92*  --  96*  CO2 24  --  24 23 22   --  23  GLUCOSE 162*  --  260* 152* 188*  --  166*  BUN 11  --  9 6* 6*  --  8  CREATININE 0.85  --  0.66 0.46* 0.52*  --  0.58*  CALCIUM 9.3  --  9.5 8.5* 8.3*  --  8.1*  MG  --  1.7  --   --   --  1.3* 2.0  PHOS  --  2.1*  --   --   --  2.6  --    GFR Estimated Creatinine Clearance: 82.7 mL/min (A) (by C-G formula based on SCr of 0.58 mg/dL (L)). Liver Function Tests: Recent Labs  Lab 03/26/23 0935 03/28/23 0841 03/29/23 0718  AST 20 26 31   ALT 8 10 12   ALKPHOS 155* 178* 193*  BILITOT 0.5 1.3* 0.8  PROT 7.9 7.9 8.1  ALBUMIN 3.1* 3.1* 3.0*   Recent Labs  Lab 03/29/23 0718  LIPASE 22   No results for input(s): "AMMONIA" in the last 168 hours. Coagulation profile No results for input(s): "INR", "PROTIME" in the last 168 hours.  CBC: Recent Labs  Lab 03/26/23 0935 03/28/23 0841 03/29/23 0718 03/31/23 0724  WBC 13.3* 13.9* 15.1* 10.8*  NEUTROABS 9.1* 11.0*  --  8.5*  HGB 8.2* 9.4* 9.3* 7.6*  HCT 26.7* 29.0*  28.8* 22.9*  MCV 79.2* 76.3* 76.0* 75.3*  PLT 467* 363 393 286   Cardiac Enzymes: No results for input(s): "CKTOTAL", "CKMB", "CKMBINDEX", "TROPONINI" in the last 168 hours. BNP (last 3 results) No results for input(s): "PROBNP" in  the last 8760 hours. CBG: Recent Labs  Lab 03/30/23 1120 03/30/23 1702 03/30/23 2044 03/31/23 0757 03/31/23 1216  GLUCAP 134* 205* 171* 206* 192*   D-Dimer: No results for input(s): "DDIMER" in the last 72 hours. Hgb A1c: Recent Labs    03/29/23 0717  HGBA1C 6.5*   Lipid Profile: No results for input(s): "CHOL", "HDL", "LDLCALC", "TRIG", "CHOLHDL", "LDLDIRECT" in the last 72 hours. Thyroid function studies: No results for input(s): "TSH", "T4TOTAL", "T3FREE", "THYROIDAB" in the last 72 hours.  Invalid input(s): "FREET3" Anemia work up: No results for input(s): "VITAMINB12", "FOLATE", "FERRITIN", "TIBC", "IRON", "RETICCTPCT" in the last 72 hours. Sepsis Labs: Recent Labs  Lab 03/26/23 0935 03/28/23 0841 03/29/23 0718 03/31/23 0724  WBC 13.3* 13.9* 15.1* 10.8*    Microbiology No results found for this or any previous visit (from the past 240 hour(s)).  Procedures and diagnostic studies:  No results found.             LOS: 1 day   Zellie Jenning  [facility]   Pager on [email or web address]. If 7PM-7AM, please contact night-coverage at [email or web address]     03/31/2023, 3:52 PM

## 2023-03-31 NOTE — Telephone Encounter (Signed)
This patients sister called to let us know he is in the hospital. She is requesting a visit from Dr. Cathie Hoops.

## 2023-03-31 NOTE — Consult Note (Signed)
Hematology/Oncology Consult note [phone] [phone]      Patient Care Team: [facility] as PCP - General Debbe Odea, MD as PCP - Cardiology (Cardiology) Rickard Patience, MD as Consulting Physician (Oncology)   Name of the patient: Louis Gardner  [id number]  [birth year]   REASON FOR COSULTATION:  Metastatic prostate cancer  History of presenting illness-  62 y.o. male with PMH listed at below who presents to ER for evaluation due to generalized weakness, worsening of swelling.  He has very poor oral intake.  The day prior to his presentation, he experienced left-sided rib cage pain, dull ache, constant no exacerbating or relieving factors. Chest pain workup reviewed negative troponins, EKG showed normal ST-T.  CTA was negative for PE.   He was also found to have hypokalemia, hyponatremia felt to be secondary to poor oral intake.   He was admitted for further management. Patient and family have expressed interest in hospice Sister requested oncology consultation for additional questions.  Patient was seen at the bedside.  He expressed desire to go home on hospice.  Patient feels very weak and fatigued. Oral intake is very poor. Sister is at the bedside.   No Known Allergies  Patient Active Problem List   Diagnosis Date Noted   Prostate cancer metastatic to bone (HCC) 11/27/2022    Priority: High   Constipation 03/28/2023    Priority: Medium    Hypokalemia 02/21/2023    Priority: Medium    Protein-calorie malnutrition, severe (HCC) 02/12/2023    Priority: Medium    Nausea without vomiting 02/12/2023    Priority: Medium    Neoplasm related pain 02/12/2023    Priority: Medium    Hyponatremia 01/20/2023    Priority: Medium    Dysphagia 01/18/2023    Priority: Medium    Normocytic anemia 01/13/2023    Priority: Medium    Goals of care, counseling/discussion 02/12/2023    Priority: Low   Screening for colon cancer 11/27/2022     Priority: Low   Bladder outlet obstruction 11/27/2022    Priority: Low   Cancer cachexia (HCC) 03/20/2023   Symptomatic anemia 03/20/2023   Weakness 01/24/2023   Leucocytosis 01/22/2023   Thrombocytosis 01/22/2023   Malnutrition of moderate degree 01/21/2023   SIRS (systemic inflammatory response syndrome) (HCC) 01/20/2023   Chest pain 04/17/2015     Past Medical History:  Diagnosis Date   Arthritis    Chronic bilateral low back pain without sciatica 12/02/2017   Depression    Enlarged prostate with urinary retention 08/21/2022   Essential hypertension 03/27/2015   Gallstones and inflammation of gallbladder without obstruction 01/28/2022   GI bleeding    History of seizures 01/06/2018   Hypertension    Leg weakness, bilateral 12/02/2017   Low back pain with right-sided sciatica 12/02/2017   Non-cardiac chest pain 04/17/2015   Pancreatic cancer [facility])    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   Seizure-like activity (HCC) 12/02/2017   Sleep apnea      Past Surgical History:  Procedure Laterality Date   COLONOSCOPY     COLONOSCOPY WITH PROPOFOL N/A 12/09/2022   Procedure: COLONOSCOPY WITH PROPOFOL;  Surgeon: Wyline Mood, MD;  Location: [facility] ENDOSCOPY;  Service: Gastroenterology;  Laterality: N/A;   ESOPHAGOGASTRODUODENOSCOPY (EGD) WITH PROPOFOL N/A 03/19/2016   Procedure: ESOPHAGOGASTRODUODENOSCOPY (EGD) WITH PROPOFOL;  Surgeon: Christena Deem, MD;  Location: [facility] ENDOSCOPY;  Service: Endoscopy;  Laterality: N/A;   IR BONE MARROW BIOPSY & ASPIRATION  01/24/2023   IR CT BONE TROCAR/NEEDLE BIOPSY DEEP  01/24/2023    Social History   Socioeconomic History   Marital status: Single    Spouse name: Not on file   Number of children: Not on file   Years of education: Not on file   Highest education level: Not on file  Occupational History   Not on file  Tobacco Use   Smoking status: Every Day    Current packs/day: 0.25    Average  packs/day: 0.3 packs/day for 48.0 years (12.0 ttl pk-yrs)    Types: Cigarettes    Passive exposure: Current   Smokeless tobacco: Never  Vaping Use   Vaping status: Never Used  Substance and Sexual Activity   Alcohol use: Yes    Alcohol/week: 6.0 standard drinks of alcohol    Types: 6 Cans of beer per week   Drug use: No   Sexual activity: Not on file  Other Topics Concern   Not on file  Social History Narrative   Not on file   Social Determinants of Health   Financial Resource Strain: Low Risk  (11/27/2022)   Overall Financial Resource Strain (CARDIA)    Difficulty of Paying Living Expenses: Not very hard  Food Insecurity: No Food Insecurity (03/30/2023)   Hunger Vital Sign    Worried About Running Out of Food in the Last Year: Never true    Ran Out of Food in the Last Year: Never true  Transportation Needs: No Transportation Needs (03/30/2023)   PRAPARE - Administrator, Civil Service (Medical): No    Lack of Transportation (Non-Medical): No  Physical Activity: Not on file  Stress: No Stress Concern Present (11/27/2022)   [employer] of Occupational Health - Occupational Stress Questionnaire    Feeling of Stress : Only a little  Social Connections: Not on file  Intimate Partner Violence: Not At Risk (03/30/2023)   Humiliation, Afraid, Rape, and Kick questionnaire    Fear of Current or Ex-Partner: No    Emotionally Abused: No    Physically Abused: No    Sexually Abused: No     History reviewed. No pertinent family history.   Current Facility-Administered Medications:    amLODipine (NORVASC) tablet 10 mg, 10 mg, Oral, Daily, Mikey College T, MD, 10 mg at 03/31/23 1025   bisacodyl (DULCOLAX) suppository 10 mg, 10 mg, Rectal, Daily PRN, Emeline General, MD   Chlorhexidine Gluconate Cloth 2 % PADS 6 each, 6 each, Topical, Daily, Lurene Shadow, MD, 6 each at 03/31/23 1246   enoxaparin (LOVENOX) injection 40 mg, 40 mg, Subcutaneous, Q24H, Chipper Herb, Ping T, MD, 40  mg at 03/30/23 2217   feeding supplement (ENSURE ENLIVE / ENSURE PLUS) liquid 237 mL, 237 mL, Oral, TID BM, Zhang, Ping T, MD, 237 mL at 03/31/23 1510   insulin aspart (novoLOG) injection 0-15 Units, 0-15 Units, Subcutaneous, TID WC, Mikey College T, MD, 3 Units at 03/31/23 1245   megestrol (MEGACE) tablet 80 mg, 80 mg, Oral, BID, Mikey College T, MD, 80 mg at 03/31/23 1026   ondansetron (ZOFRAN) injection 4 mg, 4 mg, Intravenous, Q6H PRN, Mikey College T, MD   oxyCODONE (Oxy IR/ROXICODONE) immediate release tablet 5 mg, 5 mg, Oral, Q6H PRN, Mikey College T, MD, 5 mg at 03/30/23 0218   pantoprazole (PROTONIX) EC tablet 40 mg, 40 mg, Oral, Daily, Mikey College T, MD, 40 mg at 03/31/23 1025   polyethylene glycol (MIRALAX / GLYCOLAX) packet 17 g, 17 g, Oral, Daily,  Emeline General, MD, 17 g at 03/31/23 1025   pravastatin (PRAVACHOL) tablet 20 mg, 20 mg, Oral, Q2000, Mikey College T, MD, 20 mg at 03/30/23 2217   senna-docusate (Senokot-S) tablet 1 tablet, 1 tablet, Oral, Daily PRN, Emeline General, MD  Review of Systems  Constitutional:  Positive for appetite change, fatigue and unexpected weight change. Negative for chills and fever.  HENT:   Negative for hearing loss and voice change.   Eyes:  Negative for eye problems and icterus.  Respiratory:  Negative for chest tightness, cough and shortness of breath.   Cardiovascular:  Negative for chest pain and leg swelling.  Gastrointestinal:  Negative for abdominal distention and abdominal pain.       Swallowing difficulty with solid food.  Endocrine: Negative for hot flashes.  Genitourinary:  Negative for difficulty urinating, dysuria and frequency.   Musculoskeletal:  Positive for back pain. Negative for arthralgias.  Skin:  Negative for itching and rash.  Neurological:  Negative for light-headedness and numbness.  Hematological:  Negative for adenopathy. Does not bruise/bleed easily.  Psychiatric/Behavioral:  Negative for confusion.     PHYSICAL EXAM Vitals:    03/31/23 0022 03/31/23 0510 03/31/23 0756 03/31/23 1215  BP: 112/68 109/62 105/66 115/74  Pulse: 96 (!) 105 (!) 105 (!) 104  Resp: 18 18 14 14   Temp: 98.2 F (36.8 C) 98.2 F (36.8 C)  98 F (36.7 C)  TempSrc: Oral Oral    SpO2: 100% 100% 100% 100%  Weight:      Height:       Physical Exam Constitutional:      General: He is not in acute distress.    Appearance: He is ill-appearing.     Comments: Cachectic  Eyes:     General: No scleral icterus. Cardiovascular:     Rate and Rhythm: Normal rate.  Pulmonary:     Effort: Pulmonary effort is normal. No respiratory distress.  Abdominal:     General: Bowel sounds are normal. There is no distension.  Musculoskeletal:        General: Normal range of motion.     Cervical back: Normal range of motion and neck supple.  Skin:    General: Skin is warm.  Neurological:     General: No focal deficit present.     Mental Status: He is alert and oriented to person, place, and time. Mental status is at baseline.     Motor: No abnormal muscle tone.  Psychiatric:        Mood and Affect: Affect normal.       LABORATORY STUDIES    Latest Ref Rng & Units 03/31/2023    7:24 AM 03/29/2023    7:18 AM 03/28/2023    8:41 AM  CBC  WBC 4.0 - 10.5 K/uL 10.8  15.1  13.9   Hemoglobin 13.0 - 17.0 g/dL 7.6  9.3  9.4   Hematocrit 39.0 - 52.0 % 22.9  28.8  29.0   Platelets 150 - 400 K/uL 286  393  363       Latest Ref Rng & Units 03/31/2023    7:24 AM 03/30/2023    5:10 AM 03/29/2023    4:08 PM  CMP  Glucose 70 - 99 mg/dL 627  035  009   BUN 8 - 23 mg/dL 8  6  6    Creatinine 0.61 - 1.24 mg/dL 3.81  8.29  9.37   Sodium 135 - 145 mmol/L 129  126  126  Potassium 3.5 - 5.1 mmol/L 3.2  3.7  3.3   Chloride 98 - 111 mmol/L 96  92  92   CO2 22 - 32 mmol/L 23  22  23    Calcium 8.9 - 10.3 mg/dL 8.1  8.3  8.5      RADIOGRAPHIC STUDIES: I have personally reviewed the radiological images as listed and agreed with the findings in the  report. CT Angio Chest PE W/Cm &/Or Wo Cm  Result Date: 03/29/2023 CLINICAL DATA:  Pulmonary embolism (PE) suspected, high prob. Left-sided chest pain. Acute onset while at rest at home. Radiation to back. EXAM: CT ANGIOGRAPHY CHEST WITH CONTRAST TECHNIQUE: Multidetector CT imaging of the chest was performed using the standard protocol during bolus administration of intravenous contrast. Multiplanar CT image reconstructions and MIPs were obtained to evaluate the vascular anatomy. RADIATION DOSE REDUCTION: This exam was performed according to the departmental dose-optimization program which includes automated exposure control, adjustment of the mA and/or kV according to patient size and/or use of iterative reconstruction technique. CONTRAST:  75mL OMNIPAQUE IOHEXOL 350 MG/ML SOLN COMPARISON:  CT angiography chest from 01/07/2023. FINDINGS: Cardiovascular: No evidence of embolism to the proximal subsegmental pulmonary artery level. Normal cardiac size. No pericardial effusion. No aortic aneurysm. Mediastinum/Nodes: Visualized thyroid gland appears grossly unremarkable. No solid / cystic mediastinal masses. The esophagus is nondistended precluding optimal assessment. There are multiple marked enlarged mediastinal and hilar lymph nodes with largest in the subcarinal location measuring up to 2.0 x 4.5 cm and largest in the left paratracheal region measuring up to 0.3 x 2.4 cm. These are grossly similar to the prior study. No axillary or hilar lymphadenopathy by size criteria. Lungs/Pleura: The central tracheo-bronchial tree is patent. There is mild, smooth, circumferential thickening of the segmental and subsegmental bronchial walls, throughout bilateral lungs, which is nonspecific. Findings are most commonly seen with bronchitis or reactive airway disease, such as asthma. There are superimposed several areas of even marked bronchial thickening (for example, series 7, images 52, 56, 68, etc.), which are nonspecific  but appear grossly similar to the prior study. There are innumerable sub 5 mm, solid and ground-glass nodules in random distribution pattern. There is overall interval progression of the number of nodules when compared to the prior exam from 11/23/2022 and 01/07/2023. These are concerning for metastases. There is also new irregular thickening along the inferior portion of right major fissure, irregular/nodular interlobular septal thickening and thickening along the right diaphragmatic surface, also concerning for lymphatic spread of tumor. No consolidation, pleural effusion or pneumothorax. Upper Abdomen: There are innumerable hypoattenuating liver masses, occupying approximately 15-20% of the total liver parenchyma with largest lesion in the left hepatic lobe measuring up to 2.0 x 2.8 cm, favored to represent metastases. There is diffuse thickening of bilateral adrenal glands, without discrete nodule. Findings are nonspecific but mostly associated with adrenal hyperplasia. There are scattered diverticula in the splenic flexure of colon without diverticulitis. Contracted gallbladder containing small-to-moderate volume of calcified gallstones without imaging signs of acute cholecystitis. Remaining visualized upper abdominal viscera are grossly within normal limits. Musculoskeletal: The visualized soft tissues of the chest wall are grossly unremarkable. Redemonstration of extensive sclerotic lesions throughout the imaged bones, compatible with metastases. No pathological fracture. Review of the MIP images confirms the above findings. IMPRESSION: 1. No evidence of embolism to the proximal subsegmental pulmonary artery level. 2. Continued interval progression of innumerable, sub 5 mm, solid and ground-glass nodules in random distribution throughout bilateral lungs with new irregular thickening along the  right major fissure and along the right diaphragmatic surface, concerning for metastatic disease. 3. Markedly enlarged  mediastinal and hilar lymph nodes and extensive osseous metastases, also compatible with metastases. 4. There are innumerable hypoattenuating liver masses, compatible with metastases. 5. Multiple other nonacute observations, as described above. Electronically Signed   By: Jules Schick M.D.   On: 03/29/2023 08:36   DG Chest 2 View  Result Date: 03/29/2023 CLINICAL DATA:  62 year old male with left side chest pain. Pain radiating to the back. Metastatic prostate cancer. EXAM: CHEST - 2 VIEW COMPARISON:  Chest radiographs 02/03/2023 and earlier. FINDINGS: Heterogeneous bone mineralization in keeping with prostate skeletal osseous metastatic disease, seems more radiographically apparent in the ribs now compared to July. Mediastinal lymphadenopathy demonstrated on previous CTA. Stable increased mediastinal contour since September. Heart size remains normal. Visualized tracheal air column is within normal limits. Coarse bilateral pulmonary interstitial opacity is stable since July. No pneumothorax, pulmonary edema, pleural effusion or confluent lung opacity. Negative visible bowel gas. IMPRESSION: 1. Skeletal and mediastinal metastatic disease. Subtle radiographic progression of the bone disease since July. 2. Chronic pulmonary interstitial changes. No acute cardiopulmonary abnormality. Electronically Signed   By: Odessa Fleming M.D.   On: 03/29/2023 05:08   CT ABDOMEN PELVIS W CONTRAST  Result Date: 03/17/2023 CLINICAL DATA:  Bowel obstruction suspected, constipation for 10 days. EXAM: CT ABDOMEN AND PELVIS WITH CONTRAST TECHNIQUE: Multidetector CT imaging of the abdomen and pelvis was performed using the standard protocol following bolus administration of intravenous contrast. RADIATION DOSE REDUCTION: This exam was performed according to the departmental dose-optimization program which includes automated exposure control, adjustment of the mA and/or kV according to patient size and/or use of iterative  reconstruction technique. CONTRAST:  OMNIPAQUE IOHEXOL 300 MG/ML  SOLN COMPARISON:  01/07/2023 FINDINGS: Lower chest: Centrilobular micro nodules and tree-in-bud opacities in the lower lungs. Mild interlobular septal thickening. Hepatobiliary: Increased size and number of multifocal rounded hypoattenuating lesions throughout the liver. For example a 2.4 cm lesion in the anterior left hepatic lobe (series 2/image 19) previously measured 1.4 cm. Cholelithiasis. No biliary dilation. Pancreas: No ductal dilation or inflammatory stranding. Cystic focus in the uncinate process is unchanged in size measuring 8 mm. A 8 mm adjacent hyperenhancing focus is also unchanged. Spleen: Unremarkable. Adrenals/Urinary Tract: Stable adrenal gland. No urinary calculi or hydronephrosis. Foley catheter in the nondistended bladder. Stomach/Bowel: Large colonic stool load with large stool ball in the rectum. Colonic diverticulosis without diverticulitis. No evidence of obstruction or bowel wall thickening. Stomach and appendix are within normal limits. Vascular/Lymphatic: No significant vascular findings are present. No enlarged abdominal or pelvic lymph nodes. Reproductive: No acute abnormality. Other: No free intraperitoneal fluid or air. Fat containing umbilical hernia. Musculoskeletal: Diffuse heterogenous sclerotic appearance of the bone marrow. Postsurgical changes anterior abdominal wall. IMPRESSION: 1. Large colonic stool load with large stool ball in the rectum consistent with constipation. 2. Increased size and number of hepatic metastases. 3. Diffuse heterogenous sclerotic appearance of the bone marrow, consistent with metastatic disease. 4. Centrilobular micro nodules and tree-in-bud opacities in the lower lungs, likely due to small airway infection/inflammation. 5. Cholelithiasis. 6. Stable cystic focus in the uncinate process of the pancreas and adjacent hyperenhancing focus. Continued attention on follow-up.  Electronically Signed   By: Minerva Fester M.D.   On: 03/17/2023 20:10   NM PLUVICTO ADMINISTRATION  Result Date: 03/06/2023 CLINICAL DATA:  62 year old male with metastatic prostate carcinoma. Castrate resistant carcinoma. Trial taxane chemotherapy. Progression with bone metastasis. PSA  elevated 350. Anemia noted prior to therapy. Foley catheter in place. 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 Initial flush of the intravenous catheter was performed was sterile saline. The dose syringe was connected to the catheter and the Lu-177 Pluvicto administered over a 1 to 10 min infusion. Single 10 cc lushes with normal saline follow the dose. No complications were noted. The entire IV tubing, venocatheter, stopcock and syringes was removed in total, placed in a disposal bag and sent for assay of the residual activity, which will be reported at a later time in our EMR by the physics staff. Pressure was applied to the venipuncture site, and a compression bandage placed. Patient monitored for 1 hour following infusion. Radiation Safety personnel were present to perform the discharge survey, as detailed on their documentation. After a short period of observation, the patient had his IV removed. RADIOPHARMACEUTICALS:  204.92 microcuries Lu-177 PLUVICTO FINDINGS: Current Infusion: 1 Planned Infusions: 6 Patient presented to nuclear medicine for treatment. The patient's most recent blood counts were reviewed and remains a adequate candidate to proceed with Lu-177 Pluvicto. Patient hemoglobin equal 8.2 increased from 7.7. Recent transfusion. Creatinine normal.  PSA equal 352 potassium is improved to 4.3 from 2.7. The patient was situated in an  infusion suite with a contact barrier placed under the arm. Intravenous access was established, using sterile technique, and a normal saline infusion from a syringe was started. Micro-dosimetry: The prescribed radiation activity was assayed and confirmed to be within specified tolerance. IMPRESSION: Current Infusion: 1 Planned Infusions: 6 The patient tolerated the infusion well. The patient will return in 6 weeks for ongoing care. Electronically Signed   By: Genevive Bi M.D.   On: 03/06/2023 16:39   DG Chest 2 View  Result Date: 02/03/2023 CLINICAL DATA:  Chest pain. EXAM: CHEST - 2 VIEW COMPARISON:  January 18, 2023. FINDINGS: The heart size and mediastinal contours are within normal limits. Both lungs are clear. The visualized skeletal structures are unremarkable. IMPRESSION: No active cardiopulmonary disease. Electronically Signed   By: Lupita Raider M.D.   On: 02/03/2023 08:24   IR BONE MARROW BIOPSY & ASPIRATION  Result Date: 01/24/2023 INDICATION: Acute anemia; history of prostate cancer with metastatic disease EXAM: 1. Bone lesion core needle biopsy of the right ilium using fluoroscopic guidance 2. Bone marrow aspiration and core biopsy of the left ilium using fluoroscopic guidance MEDICATIONS: None. ANESTHESIA/SEDATION: Moderate (conscious) sedation was employed during this procedure. A total of Versed 2 mg and Fentanyl 100 mcg was administered intravenously. Moderate Sedation Time: 17 minutes. The patient's level of consciousness and vital signs were monitored continuously by radiology nursing throughout the procedure under my direct supervision. FLUOROSCOPY TIME:  Fluoroscopy Time: 1.3 minutes (9 mGy) COMPLICATIONS: None immediate. PROCEDURE: Informed written consent was obtained from the patient after a thorough discussion of the procedural risks, benefits and alternatives. All questions were addressed. Maximal Sterile Barrier Technique was utilized including caps, mask, sterile gowns,  sterile gloves, sterile drape, hand hygiene and skin antiseptic. A timeout was performed prior to the initiation of the procedure. The patient was placed prone on the exam table. Limited fluoroscopy of the pelvis was performed for planning purposes. Skin entry site was marked, and the overlying skin was prepped and draped in the  standard sterile fashion. Local analgesia was obtained with 1% lidocaine. Using fluoroscopic guidance, an 11 gauge needle was advanced just deep to the cortex of the right posterior ilium. Subsequently, bone marrow aspiration was attempted, yielding a dry tap. Core biopsy was performed, demonstrating exceedingly sclerotic bone. View of recent cross-sectional imaging demonstrated that needle was likely within a sclerotic lesion given history of prostate cancer. Therefore, attention was turned to the left side to re-attempt a bone marrow aspiration and core biopsy. Skin entry site was again marked, and the overlying skin was prepped and draped in the standard sterile fashion. Local analgesia was obtained with 1% lidocaine. Using fluoroscopic guidance, an 11 gauge needle was advanced just deep to the cortex of the left posterior ilium. Subsequently, bone marrow aspiration and core biopsy was performed. Specimens were submitted to lab/pathology for handling. Hemostasis was achieved with manual pressure, and a clean dressing was placed. The patient tolerated the procedure well without immediate complication. IMPRESSION: 1. Successful core needle biopsy of sclerotic bone lesion in the right posterior ilium using fluoroscopic guidance. 2. Successful bone marrow aspiration and core biopsy of the left posterior ilium using fluoroscopic guidance. Electronically Signed   By: Olive Bass M.D.   On: 01/24/2023 09:32   IR CT BONE TROCAR/NEEDLE BIOPSY DEEP  Result Date: 01/24/2023 INDICATION: Acute anemia; history of prostate cancer with metastatic disease EXAM: 1. Bone lesion core needle biopsy of  the right ilium using fluoroscopic guidance 2. Bone marrow aspiration and core biopsy of the left ilium using fluoroscopic guidance MEDICATIONS: None. ANESTHESIA/SEDATION: Moderate (conscious) sedation was employed during this procedure. A total of Versed 2 mg and Fentanyl 100 mcg was administered intravenously. Moderate Sedation Time: 17 minutes. The patient's level of consciousness and vital signs were monitored continuously by radiology nursing throughout the procedure under my direct supervision. FLUOROSCOPY TIME:  Fluoroscopy Time: 1.3 minutes (9 mGy) COMPLICATIONS: None immediate. PROCEDURE: Informed written consent was obtained from the patient after a thorough discussion of the procedural risks, benefits and alternatives. All questions were addressed. Maximal Sterile Barrier Technique was utilized including caps, mask, sterile gowns, sterile gloves, sterile drape, hand hygiene and skin antiseptic. A timeout was performed prior to the initiation of the procedure. The patient was placed prone on the exam table. Limited fluoroscopy of the pelvis was performed for planning purposes. Skin entry site was marked, and the overlying skin was prepped and draped in the standard sterile fashion. Local analgesia was obtained with 1% lidocaine. Using fluoroscopic guidance, an 11 gauge needle was advanced just deep to the cortex of the right posterior ilium. Subsequently, bone marrow aspiration was attempted, yielding a dry tap. Core biopsy was performed, demonstrating exceedingly sclerotic bone. View of recent cross-sectional imaging demonstrated that needle was likely within a sclerotic lesion given history of prostate cancer. Therefore, attention was turned to the left side to re-attempt a bone marrow aspiration and core biopsy. Skin entry site was again marked, and the overlying skin was prepped and draped in the standard sterile fashion. Local analgesia was obtained with 1% lidocaine. Using fluoroscopic guidance, an  11 gauge needle was advanced just deep to the cortex of the left posterior ilium. Subsequently, bone marrow aspiration and core biopsy was performed. Specimens were submitted to lab/pathology for handling. Hemostasis was achieved with manual pressure, and a clean dressing was placed. The patient tolerated the procedure well without immediate complication. IMPRESSION: 1. Successful core needle biopsy of sclerotic bone lesion in the right posterior ilium using fluoroscopic guidance. 2.  Successful bone marrow aspiration and core biopsy of the left posterior ilium using fluoroscopic guidance. Electronically Signed   By: Olive Bass M.D.   On: 01/24/2023 09:32   DG UGI W SINGLE CM (SOL OR THIN BA)  Result Date: 01/20/2023 CLINICAL DATA:  Esophageal dysphagia, concern for possible stricture, esophagitis, gastritis, dysmotility. EXAM: DG UGI W SINGLE CM TECHNIQUE: Single contrast examination was then performed using thin liquid barium. This exam was performed by Mina Marble, PA-C , and was supervised and interpreted by Dr. Elige Ko. FLUOROSCOPY: Radiation Exposure Index (as provided by the fluoroscopic device): 15.00 mGy Kerma COMPARISON:  None Available. FINDINGS: Esophagus:  Normal appearance. Esophageal motility: Mild dysmotility with tertiary contractions and stasis of contrast material visualized. Gastroesophageal reflux: Small volume spontaneous gastroesophageal reflux visualized, unable to provoke gastroesophageal reflux Ingested 13mm barium tablet: Passed normally Stomach: Normal appearance. No hiatal hernia. Gastric emptying: Normal. Duodenum:  Distal duodenal diverticulum visualized Other:  None. IMPRESSION: 1.  Mild esophageal dysmotility 2.  Small volume gastroesophageal reflux 3.  Distal duodenal diverticulum Electronically Signed   By: Elige Ko M.D.   On: 01/20/2023 23:30   DG Chest 2 View  Result Date: 01/18/2023 CLINICAL DATA:  Weakness and decreased appetite.  Hematemesis. EXAM: CHEST -  2 VIEW COMPARISON:  Chest radiograph dated 01/07/2023 FINDINGS: Normal lung volumes. Bilateral lower lung interstitial opacities. No pleural effusion or pneumothorax. The heart size and mediastinal contours are within normal limits. No acute osseous abnormality. IMPRESSION: Bilateral lower lung interstitial opacities, which may represent atelectasis or infection. Electronically Signed   By: Agustin Cree M.D.   On: 01/18/2023 14:09   DG Abd 1 View  Result Date: 01/15/2023 CLINICAL DATA:  abd pain/constipation, r/o obstructive gas pattern EXAM: ABDOMEN - 1 VIEW COMPARISON:  CT AP 01/07/23 FINDINGS: Nonobstructive bowel gas pattern. Large colonic stool burden. Pelvic phleboliths. Rounded density projecting over the L4-L5 vertebral body levels is nonspecific, but favored to represent patient's known fat containing umbilical hernia. Assessment of the sacrum is slightly limited due to overlying bowel gas. No acute osseous abnormality. IMPRESSION: Nonobstructive bowel gas pattern. Large colonic stool burden. Electronically Signed   By: Lorenza Cambridge M.D.   On: 01/15/2023 13:02   CT Angio Chest PE W/Cm &/Or Wo Cm  Result Date: 01/07/2023 CLINICAL DATA:  pt reports since last night having left sided CP, nonradiating accomp by nausea; Abdominal pain, acute, nonlocalized History of metastatic prostate cancer. EXAM: CT ANGIOGRAPHY CHEST CT ABDOMEN AND PELVIS WITH CONTRAST TECHNIQUE: Multidetector CT imaging of the chest was performed using the standard protocol during bolus administration of intravenous contrast. Multiplanar CT image reconstructions and MIPs were obtained to evaluate the vascular anatomy. Multidetector CT imaging of the abdomen and pelvis was performed using the standard protocol during bolus administration of intravenous contrast. RADIATION DOSE REDUCTION: This exam was performed according to the departmental dose-optimization program which includes automated exposure control, adjustment of the mA and/or kV  according to patient size and/or use of iterative reconstruction technique. CONTRAST:  OMNIPAQUE IOHEXOL 350 MG/ML SOLN COMPARISON:  Chest XR, 01/07/2023. CTA chest, 11/23/2022. CT AP, 03/26/2023. PET-CT, 12/18/2022. FINDINGS: CTA CHEST FINDINGS Cardiovascular: Satisfactory opacification of the pulmonary arteries to the segmental level. No segmental or larger pulmonary embolus. normal heart size. No pericardial effusion. Mediastinum/Nodes: Interval enlargement in mediastinal adenopathy, with representative prevascular and precarinal lymph nodes measuring approximately 2.5 x 1.7 cm and 2.5 x 3.8 cm (AP by transaxial) respectively. No enlarged axillary lymph nodes. Thyroid gland, trachea, and  esophagus demonstrate no significant findings. Lungs/Pleura: Mild basilar atelectasis. Lungs are otherwise clear without focal consolidation, mass or suspicious pulmonary nodule. No pleural effusion or pneumothorax. Musculoskeletal: Mild gynecomastia. No acute chest wall abnormality. Multifocal sclerotic osseous lesions within the imaged thoracic spine. Review of the MIP images confirms the above findings. CT ABDOMEN and PELVIS FINDINGS Hepatobiliary: Normal volume of liver and contour. Increased number and conspicuity of small, multifocal rounded and hypodense lesions scattered within liver contracted gallbladder with dense gallstones. No gallbladder wall thickening, or biliary dilatation. Pancreas: Relatively similar size and appearance ~ 1 cm hypodense pancreatic cystic lesion at the uncinate. No pancreatic ductal dilatation or surrounding inflammatory changes. Spleen: Normal in size without focal abnormality. Adrenals/Urinary Tract: Adrenal glands are unremarkable. Kidneys are normal, without renal calculi, focal lesion, or hydronephrosis. Bladder is decompressed by Foley catheter. Stomach/Bowel: Stomach is within normal limits. Appendix is not definitively visualized. Nonobstructed small bowel. Nondilated colon. Severe  burden of colonic diverticulosis, involving the descending and transverse colon. No evidence of bowel wall thickening, distention, or acute inflammatory change. Vascular/Lymphatic: No significant vascular findings are present. No enlarged abdominal or pelvic lymph nodes. Reproductive: Irregular nodular contour of prostate. Other: No abdominopelvic ascites or intraperitoneal free air. Postsurgical changes of prior laparotomy. Fat-containing, umbilical ventral hernia. Musculoskeletal: No acute osseous finding teams or displaced fracture. Multifocal osseous sclerotic lesions within the imaged pelvis and axial spine. Review of the MIP images confirms the above findings. IMPRESSION: Since CT CAP dated 12/15/2022; 1. No segmental or larger pulmonary embolus. 2. No acute abdominopelvic process. 3. Progression of metastatic disease, with enlargement in mediastinal adenopathy within chest, and increased number and conspicuity of small multifocal lesions within liver, as described in detail above. Electronically Signed   By: Roanna Banning M.D.   On: 01/07/2023 09:29   CT ABDOMEN PELVIS W CONTRAST  Result Date: 01/07/2023 CLINICAL DATA:  pt reports since last night having left sided CP, nonradiating accomp by nausea; Abdominal pain, acute, nonlocalized History of metastatic prostate cancer. EXAM: CT ANGIOGRAPHY CHEST CT ABDOMEN AND PELVIS WITH CONTRAST TECHNIQUE: Multidetector CT imaging of the chest was performed using the standard protocol during bolus administration of intravenous contrast. Multiplanar CT image reconstructions and MIPs were obtained to evaluate the vascular anatomy. Multidetector CT imaging of the abdomen and pelvis was performed using the standard protocol during bolus administration of intravenous contrast. RADIATION DOSE REDUCTION: This exam was performed according to the departmental dose-optimization program which includes automated exposure control, adjustment of the mA and/or kV according to  patient size and/or use of iterative reconstruction technique. CONTRAST:  OMNIPAQUE IOHEXOL 350 MG/ML SOLN COMPARISON:  Chest XR, 01/07/2023. CTA chest, 11/23/2022. CT AP, 03/26/2023. PET-CT, 12/18/2022. FINDINGS: CTA CHEST FINDINGS Cardiovascular: Satisfactory opacification of the pulmonary arteries to the segmental level. No segmental or larger pulmonary embolus. normal heart size. No pericardial effusion. Mediastinum/Nodes: Interval enlargement in mediastinal adenopathy, with representative prevascular and precarinal lymph nodes measuring approximately 2.5 x 1.7 cm and 2.5 x 3.8 cm (AP by transaxial) respectively. No enlarged axillary lymph nodes. Thyroid gland, trachea, and esophagus demonstrate no significant findings. Lungs/Pleura: Mild basilar atelectasis. Lungs are otherwise clear without focal consolidation, mass or suspicious pulmonary nodule. No pleural effusion or pneumothorax. Musculoskeletal: Mild gynecomastia. No acute chest wall abnormality. Multifocal sclerotic osseous lesions within the imaged thoracic spine. Review of the MIP images confirms the above findings. CT ABDOMEN and PELVIS FINDINGS Hepatobiliary: Normal volume of liver and contour. Increased number and conspicuity of small, multifocal rounded and hypodense  lesions scattered within liver contracted gallbladder with dense gallstones. No gallbladder wall thickening, or biliary dilatation. Pancreas: Relatively similar size and appearance ~ 1 cm hypodense pancreatic cystic lesion at the uncinate. No pancreatic ductal dilatation or surrounding inflammatory changes. Spleen: Normal in size without focal abnormality. Adrenals/Urinary Tract: Adrenal glands are unremarkable. Kidneys are normal, without renal calculi, focal lesion, or hydronephrosis. Bladder is decompressed by Foley catheter. Stomach/Bowel: Stomach is within normal limits. Appendix is not definitively visualized. Nonobstructed small bowel. Nondilated colon. Severe burden of  colonic diverticulosis, involving the descending and transverse colon. No evidence of bowel wall thickening, distention, or acute inflammatory change. Vascular/Lymphatic: No significant vascular findings are present. No enlarged abdominal or pelvic lymph nodes. Reproductive: Irregular nodular contour of prostate. Other: No abdominopelvic ascites or intraperitoneal free air. Postsurgical changes of prior laparotomy. Fat-containing, umbilical ventral hernia. Musculoskeletal: No acute osseous finding teams or displaced fracture. Multifocal osseous sclerotic lesions within the imaged pelvis and axial spine. Review of the MIP images confirms the above findings. IMPRESSION: Since CT CAP dated 12/15/2022; 1. No segmental or larger pulmonary embolus. 2. No acute abdominopelvic process. 3. Progression of metastatic disease, with enlargement in mediastinal adenopathy within chest, and increased number and conspicuity of small multifocal lesions within liver, as described in detail above. Electronically Signed   By: Roanna Banning M.D.   On: 01/07/2023 09:29   DG Chest Portable 1 View  Result Date: 01/07/2023 CLINICAL DATA:  Chest pain EXAM: PORTABLE CHEST 1 VIEW COMPARISON:  11/23/2022 FINDINGS: Artifact from EKG leads. Rounded density over the left mid lung is attributed to the anterior fifth rib based on prior CT, there is known prostate cancer and bone metastases. There is no edema, consolidation, effusion, or pneumothorax. Normal heart size and mediastinal contours. IMPRESSION: No active disease. Electronically Signed   By: Tiburcio Pea M.D.   On: 01/07/2023 07:41     Assessment and plan-   Metastatic prostate cancer, status post 1 for pleuvicto treatments. Severe protein calorie malnutrition due to poor oral intake Patient is interested in hospice.  Discussed with patient and his sister.  With patient's advanced metastatic prostate cancer, rapid decline of overall performance status, malnutrition, I think is  reasonable.  Proceed with hospice.  Patient and family understand that goal of comfort care/hospice is to palliate his symptoms, provide relief from pain and discomfort.  He will not receive any additional blood work and cancer treatments.  They voice understanding and are interested in being enrolled to hospice.   # Symptomatic anemia, multifactorial secondary to prostate cancer involvement in the bone marrow, as well as bone marrow suppression secondary to Pluvicto treatments.  Recommend 1 unit of PRBC transfusion.  # Hypokalemia, replete per hospitalist # Hyponatremia, secondary to poor oral intake.  Sodium has improved  Thank you for allowing me to participate in the care of this patient.   Rickard Patience, MD, PhD Hematology Oncology 03/31/2023

## 2023-03-31 NOTE — Progress Notes (Signed)
Met with patient and multiple family members at bedside. Although patient triggers for pilot, family members state they are pursuing inpatient hospice. Intake questions deferred for now.

## 2023-04-01 ENCOUNTER — Inpatient Hospital Stay: Payer: Medicaid Other

## 2023-04-01 DIAGNOSIS — C61 Malignant neoplasm of prostate: Secondary | ICD-10-CM | POA: Diagnosis not present

## 2023-04-01 DIAGNOSIS — R0789 Other chest pain: Secondary | ICD-10-CM | POA: Diagnosis not present

## 2023-04-01 DIAGNOSIS — C7951 Secondary malignant neoplasm of bone: Secondary | ICD-10-CM | POA: Diagnosis not present

## 2023-04-01 LAB — CBC WITH DIFFERENTIAL/PLATELET
Abs Immature Granulocytes: 0.18 10*3/uL — ABNORMAL HIGH (ref 0.00–0.07)
Basophils Absolute: 0 10*3/uL (ref 0.0–0.1)
Basophils Relative: 0 %
Eosinophils Absolute: 0 10*3/uL (ref 0.0–0.5)
Eosinophils Relative: 0 %
HCT: 26 % — ABNORMAL LOW (ref 39.0–52.0)
Hemoglobin: 8.7 g/dL — ABNORMAL LOW (ref 13.0–17.0)
Immature Granulocytes: 2 %
Lymphocytes Relative: 17 %
Lymphs Abs: 1.8 10*3/uL (ref 0.7–4.0)
MCH: 25.4 pg — ABNORMAL LOW (ref 26.0–34.0)
MCHC: 33.5 g/dL (ref 30.0–36.0)
MCV: 75.8 fL — ABNORMAL LOW (ref 80.0–100.0)
Monocytes Absolute: 0.9 10*3/uL (ref 0.1–1.0)
Monocytes Relative: 9 %
Neutro Abs: 7.9 10*3/uL — ABNORMAL HIGH (ref 1.7–7.7)
Neutrophils Relative %: 72 %
Platelets: 244 10*3/uL (ref 150–400)
RBC: 3.43 MIL/uL — ABNORMAL LOW (ref 4.22–5.81)
RDW: 17.3 % — ABNORMAL HIGH (ref 11.5–15.5)
WBC: 10.9 10*3/uL — ABNORMAL HIGH (ref 4.0–10.5)
nRBC: 0 % (ref 0.0–0.2)

## 2023-04-01 LAB — BASIC METABOLIC PANEL
Anion gap: 7 (ref 5–15)
BUN: 9 mg/dL (ref 8–23)
CO2: 23 mmol/L (ref 22–32)
Calcium: 8.1 mg/dL — ABNORMAL LOW (ref 8.9–10.3)
Chloride: 97 mmol/L — ABNORMAL LOW (ref 98–111)
Creatinine, Ser: 0.46 mg/dL — ABNORMAL LOW (ref 0.61–1.24)
GFR, Estimated: >60 mL/min (ref >60)
Glucose, Bld: 160 mg/dL — ABNORMAL HIGH (ref 70–99)
Potassium: 3.6 mmol/L (ref 3.5–5.1)
Sodium: 127 mmol/L — ABNORMAL LOW (ref 135–145)

## 2023-04-01 LAB — TYPE AND SCREEN
ABO/RH(D): O POS
Antibody Screen: NEGATIVE
Unit division: 0

## 2023-04-01 LAB — BPAM RBC
Blood Product Expiration Date: 202412032359
ISSUE DATE / TIME: 202411251813
Unit Type and Rh: 9500

## 2023-04-01 LAB — GLUCOSE, CAPILLARY: Glucose-Capillary: 160 mg/dL — ABNORMAL HIGH (ref 70–99)

## 2023-04-01 MED ORDER — ALPRAZOLAM 0.25 MG PO TABS: 0.2500 mg | ORAL_TABLET | Freq: Two times a day (BID) | ORAL | 0 refills | Status: DC | PRN

## 2023-04-01 NOTE — Plan of Care (Signed)
  Problem: Metabolic: Goal: Ability to maintain appropriate glucose levels will improve Outcome: Progressing   Problem: Tissue Perfusion: Goal: Adequacy of tissue perfusion will improve Outcome: Progressing   Problem: Education: Goal: Knowledge of General Education information will improve Description: Including pain rating scale, medication(s)/side effects and non-pharmacologic comfort measures Outcome: Progressing   Problem: Coping: Goal: Ability to adjust to condition or change in health will improve Outcome: Not Progressing   Problem: Fluid Volume: Goal: Ability to maintain a balanced intake and output will improve Outcome: Not Progressing   Problem: Health Behavior/Discharge Planning: Goal: Ability to manage health-related needs will improve Outcome: Not Progressing   Problem: Nutritional: Goal: Maintenance of adequate nutrition will improve Outcome: Not Progressing Goal: Progress toward achieving an optimal weight will improve Outcome: Not Progressing

## 2023-04-01 NOTE — Discharge Summary (Incomplete)
Physician Discharge Summary   Patient: Louis Gardner MRN: [id number] DOB: August 10, 1960  Admit date:     03/29/2023  Discharge date: 04/01/23  Discharge Physician: Lurene Shadow   PCP: [facility]   Recommendations at discharge:  {Tip this will not be part of the note when signed- Example include specific recommendations for outpatient follow-up, pending tests to follow-up on. (Optional):26781}  ***  Discharge Diagnoses: Principal Problem:   Chest pain Active Problems:   Prostate cancer metastatic to bone (HCC)   Dysphagia   Hyponatremia   Protein-calorie malnutrition, severe (HCC)   Hypokalemia  Resolved Problems:   * No resolved hospital problems. [facility] Course: No notes on file  Assessment and Plan: No notes have been filed under this hospital service. Service: Hospitalist   [place], sister, and Jola Babinski, girlfriend    {Tip this will not be part of the note when signed Body mass index is 20.15 kg/m. , ,  (Optional):26781}  {(NOTE) Pain control PDMP Statment (Optional):26782} Consultants: *** Procedures performed: ***  Disposition: {Plan; Disposition:26390} Diet recommendation:  Discharge Diet Orders (From admission, onward)     Start     Ordered   04/01/23 0000  Diet general        04/01/23 0837           {Diet_Plan:26776} DISCHARGE MEDICATION: Allergies as of 04/01/2023   No Known Allergies      Medication List     STOP taking these medications    amLODipine 10 MG tablet Commonly known as: NORVASC   ciprofloxacin 500 MG tablet Commonly known as: CIPRO   Jardiance 10 MG Tabs tablet Generic drug: empagliflozin   metFORMIN 500 MG tablet Commonly known as: GLUCOPHAGE   tamsulosin 0.4 MG Caps capsule Commonly known as: FLOMAX       TAKE these medications    ALPRAZolam 0.25 MG tablet Commonly known as: Xanax Take 1 tablet (0.25 mg total) by mouth 2 (two) times daily as needed for sleep or anxiety.    bisacodyl 10 MG suppository Commonly known as: Dulcolax Place 1 suppository (10 mg total) rectally daily as needed for up to 12 doses for moderate constipation.   feeding supplement Liqd Take 237 mLs by mouth 3 (three) times daily between meals.   magnesium hydroxide 400 MG/5ML suspension Commonly known as: MILK OF MAGNESIA Take 5 mLs by mouth daily as needed for mild constipation.   megestrol 40 MG tablet Commonly known as: MEGACE Take 2 tablets (80 mg total) by mouth 2 (two) times daily.   nystatin 100000 UNIT/ML suspension Commonly known as: MYCOSTATIN Use as directed 5 mLs (500,000 Units total) in the mouth or throat 4 (four) times daily.   ondansetron 8 MG disintegrating tablet Commonly known as: ZOFRAN-ODT Take 1 tablet (8 mg total) by mouth every 8 (eight) hours as needed for nausea.   oxyCODONE 5 MG immediate release tablet Commonly known as: Roxicodone Take 1 tablet (5 mg total) by mouth every 6 (six) hours as needed for breakthrough pain or severe pain (pain score 7-10).   pantoprazole 40 MG tablet Commonly known as: PROTONIX Take 1 tablet (40 mg total) by mouth daily.   polyethylene glycol 17 g packet Commonly known as: MIRALAX / GLYCOLAX Take 17 g by mouth daily.   potassium chloride SA 20 MEQ tablet Commonly known as: KLOR-CON M Take 2 tablets (40 mEq total) by mouth daily.   pravastatin 20 MG tablet Commonly known as: PRAVACHOL Take 20 mg by mouth  daily.   prochlorperazine 10 MG tablet Commonly known as: COMPAZINE TAKE 1 TABLET(10 MG) BY MOUTH EVERY 6 HOURS AS NEEDED FOR NAUSEA OR VOMITING   senna-docusate 8.6-50 MG tablet Commonly known as: Senokot-S Take 2 tablets by mouth daily. What changed: Another medication with the same name was removed. Continue taking this medication, and follow the directions you see here.        Follow-up Information     [facility] Follow up.   Why: Hospital follow up Contact information: [address]  [place] [address] [phone]                Discharge Exam: [name] Weights   03/29/23 0414 03/30/23 0423  Weight: 58.1 kg 61.9 kg   ***  Condition at discharge: {DC Condition:26389}  The results of significant diagnostics from this hospitalization (including imaging, microbiology, ancillary and laboratory) are listed below for reference.   Imaging Studies: CT Angio Chest PE W/Cm &/Or Wo Cm  Result Date: 03/29/2023 CLINICAL DATA:  Pulmonary embolism (PE) suspected, high prob. Left-sided chest pain. Acute onset while at rest at home. Radiation to back. EXAM: CT ANGIOGRAPHY CHEST WITH CONTRAST TECHNIQUE: Multidetector CT imaging of the chest was performed using the standard protocol during bolus administration of intravenous contrast. Multiplanar CT image reconstructions and MIPs were obtained to evaluate the vascular anatomy. RADIATION DOSE REDUCTION: This exam was performed according to the departmental dose-optimization program which includes automated exposure control, adjustment of the mA and/or kV according to patient size and/or use of iterative reconstruction technique. CONTRAST:  75mL OMNIPAQUE IOHEXOL 350 MG/ML SOLN COMPARISON:  CT angiography chest from 01/07/2023. FINDINGS: Cardiovascular: No evidence of embolism to the proximal subsegmental pulmonary artery level. Normal cardiac size. No pericardial effusion. No aortic aneurysm. Mediastinum/Nodes: Visualized thyroid gland appears grossly unremarkable. No solid / cystic mediastinal masses. The esophagus is nondistended precluding optimal assessment. There are multiple marked enlarged mediastinal and hilar lymph nodes with largest in the subcarinal location measuring up to 2.0 x 4.5 cm and largest in the left paratracheal region measuring up to 0.3 x 2.4 cm. These are grossly similar to the prior study. No axillary or hilar lymphadenopathy by size criteria. Lungs/Pleura: The central tracheo-bronchial tree is patent.  There is mild, smooth, circumferential thickening of the segmental and subsegmental bronchial walls, throughout bilateral lungs, which is nonspecific. Findings are most commonly seen with bronchitis or reactive airway disease, such as asthma. There are superimposed several areas of even marked bronchial thickening (for example, series 7, images 52, 56, 68, etc.), which are nonspecific but appear grossly similar to the prior study. There are innumerable sub 5 mm, solid and ground-glass nodules in random distribution pattern. There is overall interval progression of the number of nodules when compared to the prior exam from 11/23/2022 and 01/07/2023. These are concerning for metastases. There is also new irregular thickening along the inferior portion of right major fissure, irregular/nodular interlobular septal thickening and thickening along the right diaphragmatic surface, also concerning for lymphatic spread of tumor. No consolidation, pleural effusion or pneumothorax. Upper Abdomen: There are innumerable hypoattenuating liver masses, occupying approximately 15-20% of the total liver parenchyma with largest lesion in the left hepatic lobe measuring up to 2.0 x 2.8 cm, favored to represent metastases. There is diffuse thickening of bilateral adrenal glands, without discrete nodule. Findings are nonspecific but mostly associated with adrenal hyperplasia. There are scattered diverticula in the splenic flexure of colon without diverticulitis. Contracted gallbladder containing small-to-moderate volume of calcified  gallstones without imaging signs of acute cholecystitis. Remaining visualized upper abdominal viscera are grossly within normal limits. Musculoskeletal: The visualized soft tissues of the chest wall are grossly unremarkable. Redemonstration of extensive sclerotic lesions throughout the imaged bones, compatible with metastases. No pathological fracture. Review of the MIP images confirms the above findings.  IMPRESSION: 1. No evidence of embolism to the proximal subsegmental pulmonary artery level. 2. Continued interval progression of innumerable, sub 5 mm, solid and ground-glass nodules in random distribution throughout bilateral lungs with new irregular thickening along the right major fissure and along the right diaphragmatic surface, concerning for metastatic disease. 3. Markedly enlarged mediastinal and hilar lymph nodes and extensive osseous metastases, also compatible with metastases. 4. There are innumerable hypoattenuating liver masses, compatible with metastases. 5. Multiple other nonacute observations, as described above. Electronically Signed   By: Jules Schick M.D.   On: 03/29/2023 08:36   DG Chest 2 View  Result Date: 03/29/2023 CLINICAL DATA:  62 year old male with left side chest pain. Pain radiating to the back. Metastatic prostate cancer. EXAM: CHEST - 2 VIEW COMPARISON:  Chest radiographs 02/03/2023 and earlier. FINDINGS: Heterogeneous bone mineralization in keeping with prostate skeletal osseous metastatic disease, seems more radiographically apparent in the ribs now compared to July. Mediastinal lymphadenopathy demonstrated on previous CTA. Stable increased mediastinal contour since September. Heart size remains normal. Visualized tracheal air column is within normal limits. Coarse bilateral pulmonary interstitial opacity is stable since July. No pneumothorax, pulmonary edema, pleural effusion or confluent lung opacity. Negative visible bowel gas. IMPRESSION: 1. Skeletal and mediastinal metastatic disease. Subtle radiographic progression of the bone disease since July. 2. Chronic pulmonary interstitial changes. No acute cardiopulmonary abnormality. Electronically Signed   By: Odessa Fleming M.D.   On: 03/29/2023 05:08   CT ABDOMEN PELVIS W CONTRAST  Result Date: 03/17/2023 CLINICAL DATA:  Bowel obstruction suspected, constipation for 10 days. EXAM: CT ABDOMEN AND PELVIS WITH CONTRAST TECHNIQUE:  Multidetector CT imaging of the abdomen and pelvis was performed using the standard protocol following bolus administration of intravenous contrast. RADIATION DOSE REDUCTION: This exam was performed according to the departmental dose-optimization program which includes automated exposure control, adjustment of the mA and/or kV according to patient size and/or use of iterative reconstruction technique. CONTRAST:  OMNIPAQUE IOHEXOL 300 MG/ML  SOLN COMPARISON:  01/07/2023 FINDINGS: Lower chest: Centrilobular micro nodules and tree-in-bud opacities in the lower lungs. Mild interlobular septal thickening. Hepatobiliary: Increased size and number of multifocal rounded hypoattenuating lesions throughout the liver. For example a 2.4 cm lesion in the anterior left hepatic lobe (series 2/image 19) previously measured 1.4 cm. Cholelithiasis. No biliary dilation. Pancreas: No ductal dilation or inflammatory stranding. Cystic focus in the uncinate process is unchanged in size measuring 8 mm. A 8 mm adjacent hyperenhancing focus is also unchanged. Spleen: Unremarkable. Adrenals/Urinary Tract: Stable adrenal gland. No urinary calculi or hydronephrosis. Foley catheter in the nondistended bladder. Stomach/Bowel: Large colonic stool load with large stool ball in the rectum. Colonic diverticulosis without diverticulitis. No evidence of obstruction or bowel wall thickening. Stomach and appendix are within normal limits. Vascular/Lymphatic: No significant vascular findings are present. No enlarged abdominal or pelvic lymph nodes. Reproductive: No acute abnormality. Other: No free intraperitoneal fluid or air. Fat containing umbilical hernia. Musculoskeletal: Diffuse heterogenous sclerotic appearance of the bone marrow. Postsurgical changes anterior abdominal wall. IMPRESSION: 1. Large colonic stool load with large stool ball in the rectum consistent with constipation. 2. Increased size and number of hepatic metastases. 3. Diffuse  heterogenous  sclerotic appearance of the bone marrow, consistent with metastatic disease. 4. Centrilobular micro nodules and tree-in-bud opacities in the lower lungs, likely due to small airway infection/inflammation. 5. Cholelithiasis. 6. Stable cystic focus in the uncinate process of the pancreas and adjacent hyperenhancing focus. Continued attention on follow-up. Electronically Signed   By: Minerva Fester M.D.   On: 03/17/2023 20:10   NM PLUVICTO ADMINISTRATION  Result Date: 03/06/2023 CLINICAL DATA:  62 year old male with metastatic prostate carcinoma. Castrate resistant carcinoma. Trial taxane chemotherapy. Progression with bone metastasis. PSA elevated 350. Anemia noted prior to therapy. Foley catheter in place. 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 Initial flush of the intravenous catheter was performed was sterile saline. The dose syringe was connected to the catheter and the Lu-177 Pluvicto administered over a 1 to 10 min infusion. Single 10 cc lushes with normal saline follow the dose. No complications were noted. The entire IV tubing, venocatheter, stopcock and syringes was removed in total, placed in a disposal bag and sent for assay of the residual activity, which will be reported at a later time in our EMR by the physics staff. Pressure was applied to the venipuncture site, and a compression bandage placed. Patient monitored for 1 hour following infusion. Radiation Safety personnel were present to perform the discharge survey, as detailed on their documentation. After a short period of observation, the patient had his IV removed. RADIOPHARMACEUTICALS:  204.92 microcuries Lu-177 PLUVICTO FINDINGS: Current Infusion: 1  Planned Infusions: 6 Patient presented to nuclear medicine for treatment. The patient's most recent blood counts were reviewed and remains a adequate candidate to proceed with Lu-177 Pluvicto. Patient hemoglobin equal 8.2 increased from 7.7. Recent transfusion. Creatinine normal.  PSA equal 352 potassium is improved to 4.3 from 2.7. The patient was situated in an infusion suite with a contact barrier placed under the arm. Intravenous access was established, using sterile technique, and a normal saline infusion from a syringe was started. Micro-dosimetry: The prescribed radiation activity was assayed and confirmed to be within specified tolerance. IMPRESSION: Current Infusion: 1 Planned Infusions: 6 The patient tolerated the infusion well. The patient will return in 6 weeks for ongoing care. Electronically Signed   By: Genevive Bi M.D.   On: 03/06/2023 16:39    Microbiology: Results for orders placed or performed during the hospital encounter of 03/17/23  Urine Culture     Status: Abnormal   Collection Time: 03/17/23  1:56 PM   Specimen: Urine, Clean Catch  Result Value Ref Range Status   Specimen Description   Final    URINE, CLEAN CATCH Performed at [facility], [address], [place], [place] [address]    Special Requests   Final    NONE Performed at [facility], [address], [place], [place] [address]    Culture (A)  Final    >=100,000 COLONIES/mL STAPHYLOCOCCUS EPIDERMIDIS >=100,000 COLONIES/mL DIPHTHEROIDS(CORYNEBACTERIUM SPECIES) Standardized susceptibility testing for this organism is not available. Performed at [facility], [address], [place], [place] [address]    Report Status 03/20/2023 FINAL  Final   Organism ID, Bacteria STAPHYLOCOCCUS EPIDERMIDIS (A)  Final      Susceptibility   Staphylococcus epidermidis - MIC*    CIPROFLOXACIN >=8 RESISTANT Resistant  GENTAMICIN <=0.5 SENSITIVE Sensitive     NITROFURANTOIN <=16  SENSITIVE Sensitive     OXACILLIN <=0.25 SENSITIVE Sensitive     TETRACYCLINE 2 SENSITIVE Sensitive     VANCOMYCIN 1 SENSITIVE Sensitive     TRIMETH/SULFA <=10 SENSITIVE Sensitive     CLINDAMYCIN <=0.25 SENSITIVE Sensitive     RIFAMPIN <=0.5 SENSITIVE Sensitive     Inducible Clindamycin NEGATIVE Sensitive     * >=100,000 COLONIES/mL STAPHYLOCOCCUS EPIDERMIDIS    Labs: CBC: Recent Labs  Lab 03/26/23 0935 03/28/23 0841 03/29/23 0718 03/31/23 0724 04/01/23 0549  WBC 13.3* 13.9* 15.1* 10.8* 10.9*  NEUTROABS 9.1* 11.0*  --  8.5* 7.9*  HGB 8.2* 9.4* 9.3* 7.6* 8.7*  HCT 26.7* 29.0* 28.8* 22.9* 26.0*  MCV 79.2* 76.3* 76.0* 75.3* 75.8*  PLT 467* 363 393 286 244   Basic Metabolic Panel: Recent Labs  Lab 03/29/23 0717 03/29/23 0718 03/29/23 1608 03/30/23 0510 03/30/23 0930 03/31/23 0724 04/01/23 0549  NA  --  125* 126* 126*  --  129* 127*  K  --  3.0* 3.3* 3.7  --  3.2* 3.6  CL  --  87* 92* 92*  --  96* 97*  CO2  --  24 23 22   --  23 23  GLUCOSE  --  260* 152* 188*  --  166* 160*  BUN  --  9 6* 6*  --  8 9  CREATININE  --  0.66 0.46* 0.52*  --  0.58* 0.46*  CALCIUM  --  9.5 8.5* 8.3*  --  8.1* 8.1*  MG 1.7  --   --   --  1.3* 2.0  --   PHOS 2.1*  --   --   --  2.6  --   --    Liver Function Tests: Recent Labs  Lab 03/26/23 0935 03/28/23 0841 03/29/23 0718  AST 20 26 31   ALT 8 10 12   ALKPHOS 155* 178* 193*  BILITOT 0.5 1.3* 0.8  PROT 7.9 7.9 8.1  ALBUMIN 3.1* 3.1* 3.0*   CBG: Recent Labs  Lab 03/31/23 0757 03/31/23 1216 03/31/23 1719 03/31/23 2121 04/01/23 0833  GLUCAP 206* 192* 167* 157* 160*    Discharge time spent: {LESS THAN/GREATER YQMV:78469} 30 minutes.  Signed: Lurene Shadow, MD [facility] 04/01/2023

## 2023-04-01 NOTE — TOC Transition Note (Signed)
Transition of Care [facility]) - CM/SW Discharge Note   Patient Details  Name: Louis Gardner MRN: [id number] Date of Birth: Apr 19, 1961  Transition of Care [facility]) CM/SW Contact:  Margarito Liner, LCSW Phone Number: 04/01/2023, 11:04 AM   Clinical Narrative:  Patient has orders to discharge home today and has already left. DME will be delivered to the home between 1:00-3:00 today. No further concerns. CSW signing off.   Final next level of care: Home w Hospice Care Barriers to Discharge: Barriers Resolved   Patient Goals and CMS Choice      Discharge Placement                  Patient to be transferred to facility by: Family   Patient and family notified of of transfer: 04/01/23  Discharge Plan and Services Additional resources added to the After Visit Summary for                                       Social Determinants of Health (SDOH) Interventions SDOH Screenings   Food Insecurity: No Food Insecurity (03/30/2023)  Housing: Low Risk  (03/30/2023)  Transportation Needs: No Transportation Needs (03/30/2023)  Utilities: Not At Risk (03/30/2023)  Depression (PHQ2-9): Low Risk  (11/27/2022)  Financial Resource Strain: Low Risk  (11/27/2022)  Stress: No Stress Concern Present (11/27/2022)  Tobacco Use: High Risk (03/29/2023)  Health Literacy: Adequate Health Literacy (11/27/2022)     Readmission Risk Interventions    01/21/2023    2:28 PM  Readmission Risk Prevention Plan  Transportation Screening Complete  PCP or Specialist Appt within 3-5 Days Complete  HRI or Home Care Consult Complete  Palliative Care Screening Complete

## 2023-04-09 ENCOUNTER — Other Ambulatory Visit: Payer: Medicaid Other

## 2023-04-09 ENCOUNTER — Ambulatory Visit: Payer: Medicaid Other

## 2023-04-09 ENCOUNTER — Ambulatory Visit: Payer: Medicaid Other | Admitting: Oncology

## 2023-04-10 ENCOUNTER — Other Ambulatory Visit (HOSPITAL_COMMUNITY): Payer: Medicaid Other

## 2023-04-10 ENCOUNTER — Ambulatory Visit: Payer: Medicaid Other

## 2023-04-12 ENCOUNTER — Other Ambulatory Visit: Payer: Self-pay | Admitting: Oncology

## 2023-04-14 ENCOUNTER — Encounter: Payer: Self-pay | Admitting: Oncology

## 2023-04-15 ENCOUNTER — Inpatient Hospital Stay (HOSPITAL_COMMUNITY): Admission: RE | Admit: 2023-04-15 | Payer: Medicaid Other | Source: Ambulatory Visit

## 2023-04-15 ENCOUNTER — Other Ambulatory Visit (HOSPITAL_COMMUNITY): Payer: Medicaid Other

## 2023-04-22 ENCOUNTER — Telehealth: Payer: Medicaid Other | Admitting: Hospice and Palliative Medicine

## 2023-04-23 DIAGNOSIS — C61 Malignant neoplasm of prostate: Principal | ICD-10-CM

## 2023-04-23 DIAGNOSIS — C775 Secondary and unspecified malignant neoplasm of intrapelvic lymph nodes: Principal | ICD-10-CM

## 2023-04-28 ENCOUNTER — Ambulatory Visit: Admit: 2023-04-28 | Payer: PRIVATE HEALTH INSURANCE

## 2023-04-29 ENCOUNTER — Other Ambulatory Visit: Payer: Self-pay | Admitting: Oncology

## 2023-05-01 NOTE — Telephone Encounter (Signed)
Component Ref Range & Units (hover) 1 mo ago (04/01/23) 1 mo ago (03/31/23) 1 mo ago (03/30/23) 1 mo ago (03/29/23) 1 mo ago (03/29/23) 1 mo ago (03/28/23) 1 mo ago (03/26/23)  Potassium 3.6 3.2 Low  3.7 3.3 Low  3.0 Low  2.9 Low  4.5

## 2023-05-05 ENCOUNTER — Encounter: Payer: Self-pay | Admitting: Oncology

## 2023-05-09 ENCOUNTER — Other Ambulatory Visit: Payer: Self-pay | Admitting: Oncology

## 2023-05-15 ENCOUNTER — Encounter: Payer: Self-pay | Admitting: Oncology

## 2023-05-22 ENCOUNTER — Other Ambulatory Visit (HOSPITAL_COMMUNITY): Payer: Medicaid Other

## 2023-05-22 DIAGNOSIS — C775 Secondary and unspecified malignant neoplasm of intrapelvic lymph nodes: Principal | ICD-10-CM

## 2023-05-22 DIAGNOSIS — C61 Malignant neoplasm of prostate: Principal | ICD-10-CM

## 2023-05-27 ENCOUNTER — Other Ambulatory Visit (HOSPITAL_COMMUNITY): Payer: Medicaid Other

## 2023-05-29 ENCOUNTER — Other Ambulatory Visit (HOSPITAL_COMMUNITY): Payer: Medicaid Other

## 2023-05-31 ENCOUNTER — Other Ambulatory Visit: Payer: Self-pay | Admitting: Oncology

## 2023-06-07 DEATH — deceased

## 2023-06-09 ENCOUNTER — Encounter: Payer: Self-pay | Admitting: Oncology

## 2023-12-09 DIAGNOSIS — C775 Secondary and unspecified malignant neoplasm of intrapelvic lymph nodes: Principal | ICD-10-CM

## 2023-12-09 DIAGNOSIS — C61 Malignant neoplasm of prostate: Principal | ICD-10-CM
